# Patient Record
Sex: Male | Born: 1945
Health system: Southern US, Community
[De-identification: ages and names within clinical notes are randomized; demographics above are authoritative.]

## PROBLEM LIST (undated history)

## (undated) DIAGNOSIS — Z9989 Dependence on other enabling machines and devices: Secondary | ICD-10-CM

## (undated) DIAGNOSIS — I10 Essential (primary) hypertension: Secondary | ICD-10-CM

## (undated) DIAGNOSIS — I452 Bifascicular block: Secondary | ICD-10-CM

## (undated) DIAGNOSIS — E669 Obesity, unspecified: Secondary | ICD-10-CM

## (undated) DIAGNOSIS — N289 Disorder of kidney and ureter, unspecified: Secondary | ICD-10-CM

## (undated) DIAGNOSIS — E785 Hyperlipidemia, unspecified: Secondary | ICD-10-CM

## (undated) DIAGNOSIS — I219 Acute myocardial infarction, unspecified: Secondary | ICD-10-CM

## (undated) DIAGNOSIS — G4733 Obstructive sleep apnea (adult) (pediatric): Secondary | ICD-10-CM

## (undated) DIAGNOSIS — I2510 Atherosclerotic heart disease of native coronary artery without angina pectoris: Secondary | ICD-10-CM

## (undated) DIAGNOSIS — C801 Malignant (primary) neoplasm, unspecified: Secondary | ICD-10-CM

## (undated) HISTORY — DX: Dependence on other enabling machines and devices: Z99.89

## (undated) HISTORY — DX: Obstructive sleep apnea (adult) (pediatric): G47.33

## (undated) HISTORY — DX: Hyperlipidemia, unspecified: E78.5

## (undated) HISTORY — DX: Acute myocardial infarction, unspecified: I21.9

## (undated) HISTORY — PX: LOWER LEG SOFT TISSUE TUMOR EXCISION: SUR553

## (undated) HISTORY — DX: Atherosclerotic heart disease of native coronary artery without angina pectoris: I25.10

## (undated) HISTORY — PX: CORONARY ARTERY BYPASS GRAFT: SHX141

## (undated) HISTORY — DX: Obesity, unspecified: E66.9

## (undated) HISTORY — DX: Bifascicular block: I45.2

## (undated) HISTORY — DX: Essential (primary) hypertension: I10

---

## 2001-03-29 ENCOUNTER — Encounter: Payer: Self-pay | Admitting: Internal Medicine

## 2001-03-29 ENCOUNTER — Ambulatory Visit (HOSPITAL_COMMUNITY): Admission: RE | Admit: 2001-03-29 | Discharge: 2001-03-29 | Payer: Self-pay | Admitting: Internal Medicine

## 2002-09-29 ENCOUNTER — Encounter: Payer: Self-pay | Admitting: Internal Medicine

## 2002-09-29 ENCOUNTER — Ambulatory Visit (HOSPITAL_COMMUNITY): Admission: RE | Admit: 2002-09-29 | Discharge: 2002-09-29 | Payer: Self-pay | Admitting: Internal Medicine

## 2002-10-18 ENCOUNTER — Observation Stay (HOSPITAL_COMMUNITY): Admission: RE | Admit: 2002-10-18 | Discharge: 2002-10-19 | Payer: Self-pay | Admitting: General Surgery

## 2004-08-17 DIAGNOSIS — C801 Malignant (primary) neoplasm, unspecified: Secondary | ICD-10-CM

## 2004-08-17 HISTORY — DX: Malignant (primary) neoplasm, unspecified: C80.1

## 2004-09-08 ENCOUNTER — Ambulatory Visit: Payer: Self-pay | Admitting: *Deleted

## 2004-09-24 ENCOUNTER — Encounter (HOSPITAL_COMMUNITY): Admission: RE | Admit: 2004-09-24 | Discharge: 2004-09-25 | Payer: Self-pay | Admitting: *Deleted

## 2004-09-24 ENCOUNTER — Ambulatory Visit: Payer: Self-pay | Admitting: *Deleted

## 2004-10-02 ENCOUNTER — Ambulatory Visit: Payer: Self-pay | Admitting: *Deleted

## 2004-11-06 ENCOUNTER — Ambulatory Visit (HOSPITAL_COMMUNITY): Admission: RE | Admit: 2004-11-06 | Discharge: 2004-11-06 | Payer: Self-pay | Admitting: General Surgery

## 2005-10-05 ENCOUNTER — Ambulatory Visit: Payer: Self-pay | Admitting: *Deleted

## 2005-10-19 ENCOUNTER — Ambulatory Visit: Payer: Self-pay | Admitting: Cardiology

## 2005-11-05 ENCOUNTER — Ambulatory Visit: Payer: Self-pay | Admitting: *Deleted

## 2005-11-26 ENCOUNTER — Ambulatory Visit: Payer: Self-pay | Admitting: Cardiology

## 2005-12-17 ENCOUNTER — Ambulatory Visit: Payer: Self-pay | Admitting: *Deleted

## 2006-08-31 ENCOUNTER — Encounter (INDEPENDENT_AMBULATORY_CARE_PROVIDER_SITE_OTHER): Payer: Self-pay | Admitting: *Deleted

## 2006-08-31 LAB — CONVERTED CEMR LAB
AST: 17 units/L
Albumin: 4.4 g/dL
Bilirubin, Direct: 0.3 mg/dL
Cholesterol: 137 mg/dL
Total Protein: 6.8 g/dL
Triglycerides: 138 mg/dL

## 2006-09-24 ENCOUNTER — Ambulatory Visit: Payer: Self-pay | Admitting: Cardiovascular Disease

## 2006-10-06 ENCOUNTER — Ambulatory Visit: Payer: Self-pay | Admitting: Cardiovascular Disease

## 2006-10-06 ENCOUNTER — Encounter (HOSPITAL_COMMUNITY): Admission: RE | Admit: 2006-10-06 | Discharge: 2006-11-05 | Payer: Self-pay | Admitting: Cardiovascular Disease

## 2006-12-22 ENCOUNTER — Ambulatory Visit: Payer: Self-pay | Admitting: Cardiovascular Disease

## 2007-02-03 ENCOUNTER — Ambulatory Visit: Payer: Self-pay | Admitting: Cardiovascular Disease

## 2007-06-07 ENCOUNTER — Encounter (INDEPENDENT_AMBULATORY_CARE_PROVIDER_SITE_OTHER): Payer: Self-pay | Admitting: General Surgery

## 2007-06-07 ENCOUNTER — Ambulatory Visit (HOSPITAL_COMMUNITY): Admission: RE | Admit: 2007-06-07 | Discharge: 2007-06-07 | Payer: Self-pay | Admitting: General Surgery

## 2007-11-28 ENCOUNTER — Ambulatory Visit: Payer: Self-pay | Admitting: Cardiovascular Disease

## 2007-12-02 ENCOUNTER — Ambulatory Visit: Payer: Self-pay | Admitting: Cardiology

## 2007-12-02 ENCOUNTER — Encounter (HOSPITAL_COMMUNITY): Admission: RE | Admit: 2007-12-02 | Discharge: 2008-01-01 | Payer: Self-pay | Admitting: Cardiovascular Disease

## 2009-01-09 DIAGNOSIS — Z951 Presence of aortocoronary bypass graft: Secondary | ICD-10-CM | POA: Insufficient documentation

## 2009-01-09 DIAGNOSIS — I10 Essential (primary) hypertension: Secondary | ICD-10-CM | POA: Insufficient documentation

## 2009-01-09 DIAGNOSIS — E785 Hyperlipidemia, unspecified: Secondary | ICD-10-CM | POA: Insufficient documentation

## 2009-01-09 DIAGNOSIS — I2510 Atherosclerotic heart disease of native coronary artery without angina pectoris: Secondary | ICD-10-CM

## 2009-01-11 ENCOUNTER — Ambulatory Visit: Payer: Self-pay | Admitting: Cardiovascular Disease

## 2009-05-22 ENCOUNTER — Telehealth: Payer: Self-pay | Admitting: Cardiovascular Disease

## 2009-07-22 ENCOUNTER — Ambulatory Visit: Payer: Self-pay | Admitting: Cardiovascular Disease

## 2009-07-22 ENCOUNTER — Encounter (INDEPENDENT_AMBULATORY_CARE_PROVIDER_SITE_OTHER): Payer: Self-pay | Admitting: *Deleted

## 2009-07-22 DIAGNOSIS — E669 Obesity, unspecified: Secondary | ICD-10-CM | POA: Insufficient documentation

## 2009-07-22 DIAGNOSIS — E663 Overweight: Secondary | ICD-10-CM

## 2010-02-03 ENCOUNTER — Ambulatory Visit: Payer: Self-pay | Admitting: Cardiovascular Disease

## 2010-02-04 ENCOUNTER — Encounter: Payer: Self-pay | Admitting: Cardiovascular Disease

## 2010-02-11 ENCOUNTER — Telehealth (INDEPENDENT_AMBULATORY_CARE_PROVIDER_SITE_OTHER): Payer: Self-pay | Admitting: *Deleted

## 2010-02-18 LAB — CONVERTED CEMR LAB
ALT: 19 units/L (ref 0–53)
Alkaline Phosphatase: 80 units/L (ref 39–117)
Bilirubin, Direct: 0.3 mg/dL (ref 0.0–0.3)
HDL: 35 mg/dL — ABNORMAL LOW (ref >39)
Hgb A1c MFr Bld: 5.6 % (ref <5.7)
LDL Cholesterol: 88 mg/dL (ref 0–99)
Total Bilirubin: 1.5 mg/dL — ABNORMAL HIGH (ref 0.3–1.2)
Total Protein: 6.6 g/dL (ref 6.0–8.3)

## 2010-09-16 NOTE — Progress Notes (Signed)
Summary: RX REFILLS   Phone Note Call from Patient Call back at Home Phone [phone]   Reason for Call: Refill Medication Summary of Call: PT NEEDS ALL HIS MEDICATIONS REFILLED FOR THE YEAR. HCTZ, METOPROLOL, K, NITRO, VYTORIN AND WHAT EVER ELSE HE IS ON. [facility] [place]. Initial call taken by: Faythe Ghee,  February 11, 2010 9:44 AM    Prescriptions: LOSARTAN POTASSIUM 100 MG TABS (LOSARTAN POTASSIUM) take 1 tablet by mouth once daily  #30 Tablet x 11   Entered by:   Teressa Lower RN   Authorized by:   Colon Branch, MD, [facility]   Signed by:   Teressa Lower RN on 02/11/2010   Method used:   [facility]ronically [facility]        [facility] [phone][facility] (retail)       [address]       [place], [place]  [address]       Ph: [phone] or [phone]       Fax: [phone]   RxID:   [id number] VYTORIN 10-10 MG TABS (EZETIMIBE-SIMVASTATIN) Take one tablet by mouth daily at bedtime  #30 x 11   Entered by:   Teressa Lower RN   Authorized by:   Colon Branch, MD, [facility]   Signed by:   Teressa Lower RN on 02/11/2010   Method used:   Electronically to        [facility] [phone]* (retail)       [address]       [place], [place]  [address]       Ph: [phone] or [phone]       Fax: [phone]   RxID:   [id number] NITROGLYCERIN 0.4 MG SUBL (NITROGLYCERIN) One tablet under tongue every 5 minutes as needed for chest pain---may repeat times three  #25 x 3   Entered by:   Teressa Lower RN   Authorized by:   Colon Branch, MD, [facility]   Signed by:   Teressa Lower RN on 02/11/2010   Method used:   [facility]ronically [facility]        [facility] [phone]* (retail)       [address]       [place], [place]  [address]       Ph: [phone] or [phone]       Fax: [phone]   RxID:   [id number] LOPRESSOR 100 MG TABS (METOPROLOL TARTRATE) 1 tab by mouth two times a day  #60 x 11   Entered by:   Teressa Lower  RN   Authorized by:   Colon Branch, MD, [facility]   Signed by:   Teressa Lower RN on 02/11/2010   Method used:   [facility]ronically [facility]        [facility] [phone]* (retail)       [address]       [place], [place]  [address]       Ph: [phone] or [phone]       Fax: [phone]   RxID:   [id number] HYDROCHLOROTHIAZIDE 25 MG TABS (HYDROCHLOROTHIAZIDE) Take 1 tablet by mouth once daily  #30 Tablet x 11   Entered by:   Teressa Lower RN   Authorized by:   Colon Branch, MD, [facility]   Signed by:  Teressa Lower RN on 02/11/2010   Method used:   [facility]        [facility] [phone]* (retail)       [address]       [place], [place]  [address]       Ph: [phone] or [phone]       Fax: [phone]   RxID:   [id number]

## 2010-09-16 NOTE — Assessment & Plan Note (Signed)
Summary: 6 mth f/u per checkout on 07/22/09/tg      Allergies Added:   Visit Type:  Follow-up Primary Provider:  Dr.Golding  CC:  ankle edema.  History of Present Illness: Joshua Gentry is S/P CABG in 73 with normal LV function.  He has HTN and elevated lipids.  He is not having SSCP. t.  His last myovue that was non-ischemic I believe was 2008  He has lost a few pounds since I last saw him and is walking again.  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    Current Problems (verified): 1)  Overweight  (ICD-278.02) 2)  Hyperlipidemia  (ICD-272.4) 3)  Hypertension  (ICD-401.9) 4)  Cad  (ICD-414.00)  Current Medications (verified): 1)  Lopressor 100 Mg Tabs (Metoprolol Tartrate) .Marland Kitchen.. 1 Tab By Mouth Two Times A Day 2)  Nitroglycerin 0.4 Mg Subl (Nitroglycerin) .... One Tablet Under Tongue Every 5 Minutes As Needed For Chest Pain---May Repeat Times Three 3)  Vytorin 10-10 Mg Tabs (Ezetimibe-Simvastatin) .... Take One Tablet By Mouth Daily At Bedtime 4)  Hydrochlorothiazide 25 Mg Tabs (Hydrochlorothiazide) .... Take 1 Tablet By Mouth Once Daily 5)  Losartan Potassium 100 Mg Tabs (Losartan Potassium) .... Take 1 Tablet By Mouth Once Daily 6)  Aspirin 325 Mg Tabs (Aspirin) .... Take 1 Tab Daily 7)  Vitamin E 400 Unit Caps (Vitamin E) .... Take 1 Tab Daily 8)  Potassium 99 Mg Tabs (Potassium) .... Take 1 Tab Daily 9)  Co Q10 100 Mg Tabs (Coenzyme Q10) .... Take 1 Tab Daily 10)  Biotin 1000 Mcg Tabs (Biotin) .... Take 1 Tab Daily 11)  Vitamin C 500 Mg Tabs (Ascorbic Acid) .... Take 1 Tab Daily 12)  Glucosamine Chondroitin Complx  Caps (Glucosamine-Chondroit-Biofl-Mn) .... Take 1 Cap Daily 13)  Daily Multiple Vitamins  Tabs (Multiple Vitamin) .... Take 1 Tab Daily 14)  Calcium Carbonate 600 Mg Tabs (Calcium Carbonate)  .... Take 1 Tab Daily 15)  Fish Oil 300 Mg Caps (Omega-3 Fatty Acids) .... Take 1 Cap Daily  Allergies (verified): 1)  ! Penicillin 2)  ! * Niaspan  Past History:  Past Medical History: Last updated: 01/13/2009 Current Problems:  HYPERLIPIDEMIA (ICD-272.4) HYPERTENSION (ICD-401.9) CAD (ICD-414.00)  Past Surgical History: Last updated: 2009/01/13 cabg times 4  Family History: Last updated: 01-13-2009 Father:deceased due to myocardial infarction Mother:living with diabetes and hypertension  Social History: Last updated: 01/13/09 2 sisters with hypertension Married  Tobacco Use - No.  Alcohol Use - no Regular Exercise - no Drug Use - no patient has 3 children alive and well works has a [employer]ar[employer]  Review of Systems       Denies fever, malais, weight loss, blurry vision, decreased visual acuity, cough, sputum, SOB, hemoptysis, pleuritic pain, palpitaitons, heartburn, abdominal pain, melena, lower extremity edema, claudication, or rash.   Vital Signs:  Patient profile:   65 year old male Weight:      274 pounds Pulse rate:   58 / minute BP sitting:   146 / 84  (right arm)  Vitals Entered By: Dreama Saa, CNA (February 03, 2010 3:41 PM)  Physical Exam  General:  Affect appropriate Healthy:  appears stated age HEENT: normal Neck supple with no adenopathy JVP normal no bruits no thyromegaly Lungs clear with no wheezing and  good diaphragmatic motion Heart:  S1/S2 no murmur,rub, gallop or click PMI normal Abdomen: benighn, BS positve, no tenderness, no AAA no bruit.  No HSM or HJR Distal pulses intact with no bruits No edema Neuro non-focal Skin warm and dry    Impression & Recommendations:  Problem # 1:  HYPERLIPIDEMIA (ICD-272.4) Check labs this week continue statin.  LDL target 70 or less His updated medication list for this problem includes:    Vytorin 10-10 Mg Tabs (Ezetimibe-simvastatin) .Marland Kitchen... Take one tablet by mouth daily at  bedtime  Future Orders: T-Lipid Profile ([id number]) ... 02/04/2010 T-Hepatic Function [phone]) ... 02/04/2010  Problem # 2:  HYPERTENSION (ICD-401.9) Well controlled His updated medication list for this problem includes:    Lopressor 100 Mg Tabs (Metoprolol tartrate) .Marland Kitchen... 1 tab by mouth two times a day    Hydrochlorothiazide 25 Mg Tabs (Hydrochlorothiazide) .Marland Kitchen... Take 1 tablet by mouth once daily    Losartan Potassium 100 Mg Tabs (Losartan potassium) .Marland Kitchen... Take 1 tablet by mouth once daily    Aspirin 325 Mg Tabs (Aspirin) .Marland Kitchen... Take 1 tab daily  Future Orders: T-Hgb A1C ([id number]) ... 02/04/2010  Problem # 3:  CAD (ICD-414.00) S/P CABG with no angina and nonischemic myovue in 2008 His updated medication list for this problem includes:    Lopressor 100 Mg Tabs (Metoprolol tartrate) .Marland Kitchen... 1 tab by mouth two times a day    Nitroglycerin 0.4 Mg Subl (Nitroglycerin) ..... One tablet under tongue every 5 minutes as needed for chest pain---may repeat times three    Aspirin 325 Mg Tabs (Aspirin) .Marland Kitchen... Take 1 tab daily  Future Orders: T-Hgb A1C ([id number]) ... 02/04/2010  Problem # 4:  OVERWEIGHT (ICD-278.02) Discussed low carb diet and exercise program again.  Check HbA1c for type two diabetes  Patient Instructions: 1)  Your physician recommends that you schedule a follow-up appointment in: 1year 2)  Your physician recommends that you return for lab work in: This week 3)  Your physician recommends that you continue on your current medications as directed. Please refer to the Current Medication list given to you today.

## 2010-12-30 NOTE — Assessment & Plan Note (Signed)
[facility]                       [place] CARDIOLOGY OFFICE NOTE   ZORIAN, GUNDERMAN                     MRN:          [id number]  DATE:11/28/2007                            DOB:          April 21, 1946    Mr. Michalik is seen today in follow up.  His primary care doctor is now  Dr. Phillips Odor.  He had previous CABG in 1997.  His Myoview in March 2008,  was nonischemic.  He needs a follow-up in the next few weeks.  EF was  57%.  He is not having chest pain.  He tends to gain quite a bit of  weight over the wintertime and this is no exception.  He has gone from  254 to 263.  He tends to take his vacation in the fall and then has golf  outing on holidays.  We talked about his weight at length.  He is  determined to increase his activity levels and decrease his caloric  intake.  He continues to take his blood pressure pills.   He denied any significant chest pain, PND or orthopnea.  There has been  no lower extremity edema or palpitations.   MEDICATIONS:  1. Aspirin a day.  2. Calcium.  3. Multivitamins.  4. Avapro 300.  5. Hydrochlorothiazide 25.  6. Vytorin 10/20.  7. Lopressor 100 b.i.d.  8. Norvasc and clonidine were stopped due to postural hypotension.  9. Potassium.  10.Coenzyme Q.   PHYSICAL EXAMINATION:  GENERAL:  Remarkable for an overweight white male  in no distress.  VITAL SIGNS:  Blood pressure 130/80, weight is 263, respiratory rate 16,  afebrile, pulse 64.  HEENT:  Unremarkable.  NECK:  Carotids normal without bruit.  No lymphadenopathy.  No  thyromegaly or JVP elevation.  LUNGS:  Clear with diaphragmatic motion.  No wheezing.  HEART:  S1-S2, normal heart sounds.  PMI normal.  ABDOMEN:  Benign.  Bowel sounds positive.  No AAA, no tenderness.  No  hepatosplenomegaly or hepatojugular reflux.  EXTREMITIES:  Distal pulses intact.  No edema.  NEURO:  Nonfocal.  SKIN:  Warm and dry.  No muscular weakness.   IMPRESSION:  1.  Coronary disease, previous coronary artery bypass grafting, follow-      up Myoview  2. Hypertension, currently well-controlled.  Combine Avapro/HCTZ to      Avalide.  Low-salt diet.  3. Hyperlipidemia.  Continue Vytorin.  Lipid and liver profile in 6      months.  I will see the patient for a stress Myoview, and as long      as this is low-risk see him back in 6 months.     Noralyn Pick. Eden Emms, MD, [facility]  Electronically Signed    PCN/MedQ  DD: 11/28/2007  DT: 11/28/2007  Job #: [id number]

## 2010-12-30 NOTE — Assessment & Plan Note (Signed)
[facility]                       [place] CARDIOLOGY OFFICE NOTE   MARQUE, RADEMAKER                     MRN:          [id number]  DATE:01/11/2009                            DOB:          1945-12-07    Joshua Gentry returns today for followup.  He had previous coronary artery  bypass surgery in 1997 by Dr. Samule Ohm.  He has done well.  He is not  having a history of chest pain, PND, or orthopnea.  There has been no  palpitations.   His last Myoview as far as I can tell was December 02, 2007.  He had  hypertensive response to exercise.  He had a previous inferior wall  infarct with no ischemia and EF of 57%.  He has been active.  He still  works at [employer], site and at the [employer] on the  weekends.  He has not had any new symptoms.  Unfortunately his weight  continues to go up.  I reviewed these in his chart.  In 2008, he was 254  and in 2009 he was 263 and now he is 273.  I had a long discussion with  him regarding his weight.  Certainly is not helping his glucose  tolerance or his blood pressure.  He has gotten away from running on a  treadmill.  I gave him an exercise prescription with a target heart rate  of 130.   Otherwise, he has been compliant with his medications.  I told him to  follow up with Dr. Phillips Odor as he may need the addition of Norvasc to his  blood pressure meds if his weight does not come down.   He is allergic to PENICILLIN and NEOSPORIN.   He is happily married.  He likes to golf.  He works in [employer] site.  He has been more sedentary lately and diet is somewhat poor particularly  in regards to salt intake.  He does not smoke or drink.   MEDICATIONS:  1. Aspirin a day.  2. Lopressor 100 b.i.d.  3. Avalide 325.  4. Vytorin 10/20.   PHYSICAL EXAMINATION:  GENERAL:  An overweight 6 feet 2 male.  Affect is  jovial.  VITAL SIGNS:  Weight is 273, blood pressure 150/88, pulse 54 and  regular,  respiratory 14, afebrile.  HEENT:  Unremarkable.  NECK:  Carotids are normal without bruit.  No lymphadenopathy,  thyromegaly, or JVP elevation.  LUNGS:  Clear with good diaphragmatic motion.  No wheezing.  CARDIAC:  S1 and S2.  Normal heart sounds.  Status post sternotomy.  PMI  normal.  ABDOMEN:  Benign.  Bowel sounds positive.  No AAA.  No tenderness.  No  bruit.  No hepatosplenomegaly or hepatojugular reflux.  EXTREMITIES:  Distal pulse intact.  No edema.  NEUROLOGIC:  Nonfocal.  SKIN:  Warm and dry.  MUSCULOSKELETAL:  No muscle weakness.  He has a right radial harvest  site as well with an absent right radial pulse.   EKG shows sinus brady with left axis deviation, incomplete right bundle  branch block, previous inferior wall MI mass by left anterior fascicular  block, and poor R-wave progression.   IMPRESSION:  1. Coronary artery disease, previous coronary artery bypass graft,      asymptomatic.  Continue aspirin and beta-blocker.  2. Weight gain, low-carbohydrate diet, went over to increase exercise      particularly with previous treadmill work.  Target weight 155      within the next 6 months.  3. Hyperlipidemia.  Continue Vytorin.  He needs a followup lipid and      liver profile.  He will come back to [facility] next week      to assess that.  4. Hypertension, borderline control.  Continue Avalide.  Follow up      with Dr. Phillips Odor.  Consider adding Norvasc in 6 months if his      weight stays up.   He will see one of the cardiologists in [place] in 6 months.     Noralyn Pick. Eden Emms, MD, [facility]  Electronically Signed    PCN/MedQ  DD: 01/11/2009  DT: 01/12/2009  Job #: [id number]

## 2010-12-30 NOTE — Assessment & Plan Note (Signed)
[facility]                       [place] CARDIOLOGY OFFICE NOTE   MIHCAEL, LEDEE                     MRN:          [id number]  DATE:02/03/2007                            DOB:          [birth year]    Mr. Blanck returns today for followup.  He has had previous coronary  bypass surgery in 1997.  I initially saw him in May for some exertional  dyspnea.  This has improved.  His blood pressure was quite low, and he  had been having some postural signs.  His Myoview is nonischemic with an  EF of 61%.  We felt that he was being over medicated.  We asked him to  wean off his clonidine and amlodipine, neither of which are particularly  effective for people with coronary disease.  He has done this and his  blood pressure has come up nicely without any rebound hypertension.   He feels better.  He continues to work out on a daily basis and work at  [employer] at [employer].   He has not had any more postural signs and no dizziness.  There has been  no significant chest pain, and exertional dyspnea has improved.   His review of systems is remarkable for increased energy level without  any presyncopal spells.  He has not had any significant chest pain.   CURRENT MEDICATIONS:  1. Aspirin a day.  2. Avapro 300 a day.  3. Vytorin 10/20.  4. Hydrochlorothiazide 25 a day.  5. Lopressor 100 b.i.d.  6. His amlodipine and clonidine has been stopped.  7. He also takes potassium.   PHYSICAL EXAMINATION:  His exam is remarkable for blood pressure of  130/70, pulse is 70 and regular.  He is afebrile.  Weight is 254 pounds.  Respiratory rate is 14.  He is a healthy-appearing middle aged white male in no distress.  NECK:  Supple.  There is no lymphadenopathy, no thyromegaly, no JVP  elevation, no carotid bruits.  LUNGS:  Clear with good diaphragmatic motion.  There is an S1, S2 with normal heart sounds.  There is no murmur.  PMI  is normal.  ABDOMEN:  Benign.  There are no renal bruits.  Bowel sounds positive.  No tenderness, no hepatosplenomegaly or hepatojugular reflux.  Femorals were +4 without bruit.  PTs were +3 bilaterally.  There is no  lower extremity edema.  NEURO:  Nonfocal.  There is no muscular weakness.   IMPRESSION:  1. Hypertension:  Well controlled, no need for clonidine or      amlodipine.  No rebound hypertension.  Currently doing well.      Continue low-salt diet.  2. Coronary bypass surgery in 1997:  Nonischemic Myoview, normal left      ventricular function.  Continue risk factor modification.  3. Hyperlipidemia: Continue Vytorin 10/20.  LDL cholesterol 72 with      minimally elevated total bilirubin.  Followup LFTs in 6 months.   Overall, the patient has improved.  He will continue to try and lose  weight and I will see him back in 6 months.  Noralyn Pick. Eden Emms, MD, [facility]  Electronically Signed    PCN/MedQ  DD: 02/03/2007  DT: 02/03/2007  Job #: [id number]

## 2010-12-30 NOTE — H&P (Signed)
NAME:  Joshua Gentry, Joshua Gentry NO.:  [id number]   MEDICAL RECORD NO.:  [id number]           PATIENT TYPE:  AMB   LOCATION:  DAY                           FACILITY:  [facility]   PHYSICIAN:  Dalia Heading, M.D.  DATE OF BIRTH:  [birth year]   DATE OF ADMISSION:  06/07/2007  DATE OF DISCHARGE:  LH                              HISTORY & PHYSICAL   CHIEF COMPLAINT:  Need for screening colonoscopy.   HISTORY OF PRESENT ILLNESS:  The patient is a 65 year old white male who  is referred for endoscopic evaluation.  He needs a colonoscopy for  screening purposes.  No abdominal pain, weight loss, nausea, vomiting,  diarrhea, constipation, melena, or hematochezia have been noted.  He has  never had a colonoscopy.  There is no family history of colon carcinoma.   PAST MEDICAL HISTORY:  1. Hypertension.  2. Non-insulin-dependent diabetes mellitus.  3. Coronary artery disease.   PAST SURGICAL HISTORY:  1. CABG.  2. Leg surgery.   CURRENT MEDICATIONS:  Hydrochlorothiazide, Avapro, aspirin, metoprolol,  Vytorin.   ALLERGIES:  PENICILLIN.   REVIEW OF SYSTEMS:  The patient denies drinking or smoking.  He denies  any other cardiopulmonary difficulties or bleeding disorders.   PHYSICAL EXAMINATION:  GENERAL:  The patient is a well-developed, well-  nourished white male in no acute distress.  LUNGS:  Clear to auscultation with equal breath sounds bilaterally.  HEART:  Examination reveals a regular rate and rhythm without S3, S4, or  murmurs.  ABDOMEN:  Soft, nontender, and nondistended.  No hepatosplenomegaly or  masses are noted.  RECTAL:  Examination was deferred to the procedure.   IMPRESSION:  Need for screening colonoscopy.   PLAN:  The patient is scheduled for colonoscopy on June 07, 2007.  Risks and benefits of the procedure including bleeding and perforation  were fully explained to the patient, who gave informed consent.      Dalia Heading, M.D.     MAJ/MEDQ  D:   05/19/2007  T:  05/20/2007  Job:  [id number]   cc:   Corrie Mckusick, M.D.  Fax: [phone]

## 2010-12-30 NOTE — H&P (Signed)
NAME:  Joshua Gentry, Joshua Gentry NO.:  [id number]   MEDICAL RECORD NO.:  [id number]           PATIENT TYPE:  AMB   LOCATION:  DAY                           FACILITY:  [facility]   PHYSICIAN:  Dalia Heading, M.D.  DATE OF BIRTH:  1945/10/11   DATE OF ADMISSION:  06/07/2007  DATE OF DISCHARGE:  LH                              HISTORY & PHYSICAL   CHIEF COMPLAINT:  Need for screening colonoscopy.   HISTORY OF PRESENT ILLNESS:  The patient is a 65 year old white male who  is referred for endoscopic evaluation.  He needs a colonoscopy for  screening purposes.  No abdominal pain, weight loss, nausea, vomiting,  diarrhea, constipation, melena, or hematochezia have been noted.  He has  never had a colonoscopy.  There is no family history of colon carcinoma.   PAST MEDICAL HISTORY:  1. Hypertension.  2. Non-insulin-dependent diabetes mellitus.  3. Coronary artery disease.   PAST SURGICAL HISTORY:  1. CABG.  2. Leg surgery.   CURRENT MEDICATIONS:  Hydrochlorothiazide, Avapro, aspirin, metoprolol,  Vytorin.   ALLERGIES:  PENICILLIN.   REVIEW OF SYSTEMS:  The patient denies drinking or smoking.  He denies  any other cardiopulmonary difficulties or bleeding disorders.   PHYSICAL EXAMINATION:  GENERAL:  The patient is a well-developed, well-  nourished white male in no acute distress.  LUNGS:  Clear to auscultation with equal breath sounds bilaterally.  HEART:  Examination reveals a regular rate and rhythm without S3, S4, or  murmurs.  ABDOMEN:  Soft, nontender, and nondistended.  No hepatosplenomegaly or  masses are noted.  RECTAL:  Examination was deferred to the procedure.   IMPRESSION:  Need for screening colonoscopy.   PLAN:  The patient is scheduled for colonoscopy on June 07, 2007.  Risks and benefits of the procedure including bleeding and perforation  were fully explained to the patient, who gave informed consent.      Dalia Heading, M.D.  Electronically  Signed     MAJ/MEDQ  D:  05/19/2007  T:  05/20/2007  Job:  [id number]   cc:   Corrie Mckusick, M.D.  Fax: [phone]

## 2011-01-02 NOTE — Procedures (Signed)
NAME:  Joshua Gentry, Joshua Gentry NO.:  [id number]   MEDICAL RECORD NO.:  [id number]         PATIENT TYPE:  REC   LOCATION:  RAD                           FACILITY:  [facility]   PHYSICIAN:  Vida Roller, M.D.   DATE OF BIRTH:  June 11, 1946   DATE OF PROCEDURE:  09/24/2004  DATE OF DISCHARGE:                                    STRESS TEST   Mr. Hedglin is a 65 year old gentleman with known coronary artery disease  status post CABG in 1997 with the following grafts: LIMA to LAD, radial OM,  SVG to diagonal, SVG to RCA and PDA with an EF of 60%. Cardiolite study done  in August of 2002 revealed no ischemia and EF of 58%. Regular treadmill test  done in 2004 revealed no ischemic changes, good exercise tolerance.   The patient originally scheduled for an exercise Myoview. He walked for  approximately 6 minutes and 44 seconds to Bruce protocol stage 3 and 7.0  METS. Maximal heart rate achieved was 115 beats per minute which is 70% of  predicted maximum. Maximum blood pressure was 200/90. The patient reported  shortness of breath and was unable to continue exercise. He denied any chest  discomfort.   Test was changed to an Adenosine Myoview at that point. Baseline data for  Adenosine revealed an EKG with a sinus rhythm at 67 beats per minute with  nonspecific ST abnormalities. Blood pressure is 142/82. 60 mg adenosine was  infused over 4 minute protocol. Myoview injected at 3 minutes. The patient  reported shortness of breath which resolved in recovery. Electrocardiogram  with exercise revealed frequent PVCs and a few couplets. No ischemic changes  were noted. An electrocardiogram with adenosine infusion revealed no  ischemic changes and no arrhythmias.     Of note, this is a two-day study and the patient did take his beta blocker  this morning.      AB/MEDQ  D:  09/24/2004  T:  09/24/2004  Job:  [id number]

## 2011-01-02 NOTE — Assessment & Plan Note (Signed)
[facility]                       [place] CARDIOLOGY OFFICE NOTE   ROCKIE, SCHNOOR                     MRN:          [id number]  DATE:09/24/2006                            DOB:          October 25, 1945    Joshua Gentry is seen as new patient to me.  He has had previous bypass in  1997 by Dr. Samule Ohm.  At the time, apparently, he had poor color and some  indigestion.   His last Myoview was in 2002, which was negative for ischemia, and  normal ejection fraction.   He has hypertension and hyperlipidemia.   He has been getting some exertional dyspnea.  He does not have  nitroglycerin at home.  He has not had any significant chest pain or  recurrent indigestion.   He is still working.  He is fairly active at work, but not at home.   He does not smoke.  He has been compliant with his meds.  He is  currently taking:  1. Omega.  2. Vitamins.  3. Avapro 300 a day.  4. Vytorin 10/20.  5. Hydrochlorothiazide 25 a day.  6. Lopressor 100 b.i.d.  7. Norvasc 5 a day.  8. Clonidine 0.1 b.i.d.  9. Coenzyme Q.  10.Potassium.   The patient was inquiring about Viagra.  I told him I would advise this  given his 63 year old bypass grafts.   He also asks me if he should stop the Zetia, and I told him that I would  not do this based on 1 study, since he has been stable on it for the  last 2 years, and his LDL cholesterol has come down nicely to 85.   EXAMINATION:  He is overweight.  Blood pressure is 130/70.  Pulse 70 and regular.  HEENT:  Normal.  Carotids are normal without bruit.  LUNGS:  Clear.  There is an S1 and S2 with normal heart sounds.  ABDOMEN:  Benign.  He has a long scar in the medial aspect of this left thigh from previous  cancer.  Distal pulses are intact with no edema.   IMPRESSION:  Exertional dyspnea with 63 year old bypass grafts.  He  needs to follow up with an exercise stress Myoview.  We will continue  current medications.  His  risk factors seem well modified.  His blood  pressure is under good control.  His LDL cholesterol is 85.  I would not  stop the Zetia at this point.   He was given a prescription for nitroglycerin.  I will see him back when  he has his stress Myoview.  As long as this is low risk, we will  continue medical therapy.     Noralyn Pick. Eden Emms, MD, [facility]  Electronically Signed    PCN/MedQ  DD: 09/24/2006  DT: 09/24/2006  Job #: [id number]

## 2011-01-02 NOTE — Op Note (Signed)
NAME:  Joshua Gentry, Joshua Gentry NO.:  [id number]   MEDICAL RECORD NO.:  [id number]                   PATIENT TYPE:  AMB   LOCATION:  DAY                                  FACILITY:  [facility]   PHYSICIAN:  Barbaraann Barthel, M.D.              DATE OF BIRTH:  10-13-1945   DATE OF PROCEDURE:  DATE OF DISCHARGE:                                 OPERATIVE REPORT   PREOPERATIVE DIAGNOSIS:  Mass right thigh (giant lipoma).   POSTOPERATIVE DIAGNOSIS:  Mass right thigh (giant lipoma).   PROCEDURE:   SURGEON:  Barbaraann Barthel, M.D.   INDICATIONS:  This is a 65 year old white male who presented with a mass in  the medial aspect of the right thigh.  This had grown to rather large  proportions and was chafing him and causing him difficulty to ambulate.  Preoperative MRI showed that this was a large lipoma likely approximately  17.6 x 12.5 x 11.5 cm in diameter. This was intramuscular in the adductor  longus muscle of his right thigh.  The patient also had a preoperative  clearance with cardiology because he had a history of coronary artery  disease.   GROSS OPERATIVE FINDINGS:  Large intramuscular lipoma with multiple  pseudopod type of extensions. These were removed in toto.  These were sent  for final pathology.  This appeared to be benign lipoma.   SPECIMENS:  Lipoma right thigh, giant.   DESCRIPTION OF PROCEDURE:  The patient was placed in the supine position  with the right leg frog-legged after adequate spinal anesthesia.  The right  lower extremity was prepped with Betadine solution and draped in the usual  manner, and a longitudinal incision was carried out over the maximal  prominence of the lesion.  The skin and subcutaneous tissue were incised  down to the adductor longus muscle which was opened and splint and the large  lipoma was removed using Surgiclip and 2-0 Polysorb ties for vascular venous  blood vessels in the area.  We avoided the large neurovascular  area in the  right groin area.  After checking for hemostasis the wound was then  irrigated.  I elected to leave a Jackson-Pratt drain due to the large size  of this lipoma.  This was entered through a separate stab wound incision.  The subcutaneous layer was closed with 3-0 Polysorb.  The skin was  approximated with a stapling device and sterile dressing with Neosporin and  a drain sponge was applied.  Prior to closure, all sponge, needle and  instrument counts were found to be correct.  Estimated blood loss was  minimal.  One Jackson-Pratt drain was placed.  The patient received  approximately 900 mL crystalloid intraoperatively.   We discussed keeping this patient in observation due to the large size of  the lipoma and the drain, etc.  We also discussed complications of the  surgery with the patient including bleeding,  infection and paresthesias in  this area, and all questions were answered.  We will likely discharge him in  less than the 24 hour observation period.                                               Barbaraann Barthel, M.D.    WB/MEDQ  D:  10/18/2002  T:  10/18/2002  Job:  [id number]   cc:   Madelin Rear. Sherwood Gambler, M.D.  [address]  [place]  [place] [address]  Fax: [phone]

## 2011-01-02 NOTE — Assessment & Plan Note (Signed)
[facility]                            CARDIOLOGY OFFICE NOTE   Joshua, Gentry                     MRN:          [id number]  DATE:12/22/2006                            DOB:          1946/05/28    Joshua Gentry returns today for followup.  He had previous bypass in 1997.  He had been having some exertional dyspnea.  He subsequently underwent a  Myoview study on January 10 of 2008.  It was normal.  There were no EKG  changes and his EF was 61%.  In talking to the patient, he has been  doing well in regards to his coronary disease.  He has been active  around the house and in the yard.  He is golfing.  He is not having  significant chest pain, PND or orthopnea.  There has been no syncope or  palpitations.  He is able to sleep at night without any shortness of  breath.   REVIEW OF SYSTEMS:  Remarkable for some fatigue and what he feels is an  excessively low blood pressure later in the day.  He says his wife has  been concerned that his blood pressure is dipping under 100.   I reviewed the patient's medicines and we will make some changes.  I do  agree he is probably on too many medications at this time.   In regards to his risk factor modification, his last LDL cholesterol was  72 in January of 2008, with normal LFTs.   He is a nonsmoker, nondrinker.   CURRENT MEDICATIONS INCLUDE:  1. An aspirin a day.  2. Avapro 300 a day.  3. Vytorin 10/20.  4. Hydrochlorothiazide 25 a day.  5. Lopressor 100 b.i.d.  6. Amlodipine 5 mg a day.  7. Clonidine 0.1 b.i.d.  8. Potassium.   His exam is remarkable for a middle-aged male in no distress.  Mood is  appropriate.  His weight is 259.  Blood pressure is 118/80.  He is not  postural.  Pulse is 67 and only goes up to 70 with standing.  Respiratory rate is 12.  HEENT:  Normal.  NECK:  No thyromegaly, no JVP elevation.  No bruits.  LUNGS:  Clear to auscultation and there is normal diaphragmatic  movement.  CARDIAC SOUNDS:  Normal with an S1 and S2.  There is no murmur, rub,  gallop or click.  The PMI is normal.  ABDOMEN:  Benign.  There is no tenderness.  Bowel sounds are positive.  There is no organomegaly or masses.  There is no hepatosplenomegaly.  No  hepatojugular reflux and no AAA.  Distal pulses are intact with no edema, no bruits.  MUSCULAR EXAM:  Shows no weakness.  NEUROLOGICAL EXAM:  Nonfocal.   IMPRESSION:  Coronary disease with old bypass grafts from 49.  Recent  Myoview with no evidence of ischemia and normal LV function.  Risk  factor modification excellent.  Continue Vytorin.   Hypertension seems to be over-treated at this time.  I told him to cut  back his Clonidine to 0.1 mg a day for a week  and then to stop the  Clonidine altogether.  This is a b.i.d. drug and his pressure probably  does get excessively low with the second dose of Clonidine.   It is also the medicine most prone to rebound and I prefer to stop this  drug first.  If the patient continues to have excessively low blood  pressure, I would next stop his amlodipine.  I talked to the patient  about this.  He will monitor his pressures and start weaning the  Clonidine.  I will see him back in six to eight weeks to reassess this  and his overall energy level to see if it improves.   From a cardiac standpoint, he is otherwise doing well.  He has  nitroglycerin at home, in case he needs it.  He will call us if he has  any chest pain, but it was encouraging to see that his Myoview is so  normal.     Joshua Arista C. Eden Emms, MD, [facility]  Electronically Signed    PCN/MedQ  DD: 12/22/2006  DT: 12/22/2006  Job #: [id number]

## 2011-02-16 ENCOUNTER — Other Ambulatory Visit: Payer: Self-pay | Admitting: *Deleted

## 2011-02-16 MED ORDER — HYDROCHLOROTHIAZIDE 25 MG PO TABS: 25.0000 mg | ORAL_TABLET | Freq: Every day | ORAL | Status: DC

## 2011-02-17 ENCOUNTER — Other Ambulatory Visit: Payer: Self-pay | Admitting: *Deleted

## 2011-02-17 MED ORDER — LOSARTAN POTASSIUM 100 MG PO TABS: 100.0000 mg | ORAL_TABLET | Freq: Every day | ORAL | Status: DC

## 2011-02-19 ENCOUNTER — Ambulatory Visit (INDEPENDENT_AMBULATORY_CARE_PROVIDER_SITE_OTHER): Payer: 59 | Admitting: Cardiovascular Disease

## 2011-02-19 ENCOUNTER — Encounter: Payer: Self-pay | Admitting: Cardiovascular Disease

## 2011-02-19 DIAGNOSIS — E785 Hyperlipidemia, unspecified: Secondary | ICD-10-CM

## 2011-02-19 DIAGNOSIS — I10 Essential (primary) hypertension: Secondary | ICD-10-CM

## 2011-02-19 DIAGNOSIS — I2510 Atherosclerotic heart disease of native coronary artery without angina pectoris: Secondary | ICD-10-CM

## 2011-02-19 NOTE — Progress Notes (Signed)
Joshua Gentry returns today for followup. He had previous bypass in 1997.  He last  underwent a Myoview study on January 10 of 2008. It was normal. There were no EKG  changes and his EF was 61%. In talking to the patient, he has been  doing well in regards to his coronary disease. He has been active  around the house and in the yard. He is golfing. He is not having  significant chest pain, PND or orthopnea. There has been no syncope or  palpitations. He is able to sleep at night without any shortness of  breath.  Lab work from Dr Phillips Odor reviewed LDL 88 and LFT;s ok with TB 1.5  ROS: Denies fever, malais, weight loss, blurry vision, decreased visual acuity, cough, sputum, SOB, hemoptysis, pleuritic pain, palpitaitons, heartburn, abdominal pain, melena, lower extremity edema, claudication, or rash.  All other systems reviewed and negative  General: Affect appropriate Healthy:  appears stated age HEENT: normal Neck supple with no adenopathy JVP normal no bruits no thyromegaly Lungs clear with no wheezing and good diaphragmatic motion Heart:  S1/S2 no murmur,rub, gallop or click PMI normal Abdomen: benighn, BS positve, no tenderness, no AAA no bruit.  No HSM or HJR Distal pulses intact with no bruits No edema Neuro non-focal Skin warm and dry No muscular weakness   Current Outpatient Prescriptions  Medication Sig Dispense Refill  . aspirin 325 MG tablet Take 325 mg by mouth daily.        . calcium-vitamin D 250-100 MG-UNIT per tablet Take 1 tablet by mouth 2 (two) times daily.        . Coenzyme Q10 (CO Q 10) 100 MG CAPS Take 100 mg by mouth daily.        Marland Kitchen ezetimibe-simvastatin (VYTORIN) 10-10 MG per tablet Take 1 tablet by mouth daily.        . Fish Oil-Cholecalciferol (OMEGA-3 FISH OIL/VITAMIN D3) 1000-1000 MG-UNIT CAPS Take by mouth.        Marland Kitchen glucosamine-chondroitin 500-400 MG tablet Take 1 tablet by mouth 3 (three) times daily.        Marland Kitchen METOPROLOL TARTRATE PO Take 100 mg by mouth  2 (two) times daily.        . Multiple Vitamins-Minerals (MULTIVITAMIN WITH MINERALS) tablet Take 1 tablet by mouth daily.        . nitroGLYCERIN (NITROSTAT) 0.4 MG SL tablet Place 0.4 mg under the tongue every 5 (five) minutes as needed.        . Potassium 99 MG TABS Take 99 mg by mouth daily.        . vitamin C (ASCORBIC ACID) 500 MG tablet Take 500 mg by mouth daily.        . vitamin E 400 UNIT capsule Take 400 Units by mouth daily.        . hydrochlorothiazide 25 MG tablet Take 1 tablet (25 mg total) by mouth daily.  30 tablet  3  . losartan (COZAAR) 100 MG tablet Take 1 tablet (100 mg total) by mouth daily.  30 tablet  11    Allergies  Niacin and Penicillins  Electrocardiogram:  Assessment and Plan

## 2011-02-19 NOTE — Patient Instructions (Signed)
Your physician recommends that you schedule a follow-up appointment in: 1 year  

## 2011-02-19 NOTE — Assessment & Plan Note (Signed)
Stable with no angina and good activity level.  Continue medical Rx  

## 2011-02-19 NOTE — Assessment & Plan Note (Signed)
Cholesterol is at goal.  Continue current dose of statin and diet Rx.  No myalgias or side effects.  F/U  LFT's in 6 months. Lab Results  Component Value Date   LDLCALC 88 02/04/2010

## 2011-02-19 NOTE — Assessment & Plan Note (Signed)
Well controlled.  Continue current medications and low sodium Dash type diet.    

## 2011-03-20 ENCOUNTER — Telehealth: Payer: Self-pay | Admitting: *Deleted

## 2011-03-20 MED ORDER — METOPROLOL TARTRATE 100 MG PO TABS: 100.0000 mg | ORAL_TABLET | Freq: Two times a day (BID) | ORAL | Status: DC

## 2011-03-20 NOTE — Telephone Encounter (Signed)
Pt needs rx sent for metoprolol 100mg  1 po bid sent to [facility] [address] [place]

## 2011-05-27 ENCOUNTER — Other Ambulatory Visit: Payer: Self-pay | Admitting: *Deleted

## 2011-05-27 MED ORDER — EZETIMIBE-SIMVASTATIN 10-10 MG PO TABS: 1.0000 | ORAL_TABLET | Freq: Every day | ORAL | Status: DC

## 2011-06-03 ENCOUNTER — Other Ambulatory Visit: Payer: Self-pay | Admitting: *Deleted

## 2011-06-03 MED ORDER — EZETIMIBE-SIMVASTATIN 10-10 MG PO TABS: 1.0000 | ORAL_TABLET | Freq: Every day | ORAL | Status: DC

## 2011-06-13 ENCOUNTER — Other Ambulatory Visit: Payer: Self-pay | Admitting: Cardiovascular Disease

## 2011-07-20 ENCOUNTER — Other Ambulatory Visit: Payer: Self-pay | Admitting: *Deleted

## 2011-07-20 MED ORDER — METOPROLOL TARTRATE 100 MG PO TABS: 100.0000 mg | ORAL_TABLET | Freq: Two times a day (BID) | ORAL | Status: DC

## 2011-08-12 ENCOUNTER — Encounter: Payer: Self-pay | Admitting: Cardiovascular Disease

## 2011-10-20 ENCOUNTER — Other Ambulatory Visit: Payer: Self-pay

## 2011-10-20 MED ORDER — HYDROCHLOROTHIAZIDE 25 MG PO TABS: 25.0000 mg | ORAL_TABLET | Freq: Every day | ORAL | Status: DC

## 2011-11-16 ENCOUNTER — Other Ambulatory Visit: Payer: Self-pay | Admitting: Cardiovascular Disease

## 2012-02-17 ENCOUNTER — Other Ambulatory Visit: Payer: Self-pay | Admitting: Cardiovascular Disease

## 2012-02-17 NOTE — Telephone Encounter (Signed)
Pt needs appointment then refill can be made Fax Received. Refill Completed. Krystal Teachey Chowoe (R.M.A)   

## 2012-02-22 ENCOUNTER — Other Ambulatory Visit: Payer: Self-pay | Admitting: *Deleted

## 2012-02-22 MED ORDER — LOSARTAN POTASSIUM 100 MG PO TABS: 100.0000 mg | ORAL_TABLET | Freq: Every day | ORAL | Status: DC

## 2012-03-18 ENCOUNTER — Ambulatory Visit (INDEPENDENT_AMBULATORY_CARE_PROVIDER_SITE_OTHER): Payer: BC Managed Care – PPO | Admitting: Cardiovascular Disease

## 2012-03-18 ENCOUNTER — Encounter: Payer: Self-pay | Admitting: Cardiovascular Disease

## 2012-03-18 VITALS — BP 150/90 | HR 59 | Ht 75.0 in | Wt 283.0 lb

## 2012-03-18 DIAGNOSIS — E663 Overweight: Secondary | ICD-10-CM

## 2012-03-18 DIAGNOSIS — I10 Essential (primary) hypertension: Secondary | ICD-10-CM

## 2012-03-18 DIAGNOSIS — I2510 Atherosclerotic heart disease of native coronary artery without angina pectoris: Secondary | ICD-10-CM

## 2012-03-18 DIAGNOSIS — E785 Hyperlipidemia, unspecified: Secondary | ICD-10-CM

## 2012-03-18 MED ORDER — LOSARTAN POTASSIUM 100 MG PO TABS: 100.0000 mg | ORAL_TABLET | Freq: Every day | ORAL | Status: DC

## 2012-03-18 MED ORDER — EZETIMIBE-SIMVASTATIN 10-10 MG PO TABS: 1.0000 | ORAL_TABLET | Freq: Every day | ORAL | Status: DC

## 2012-03-18 NOTE — Addendum Note (Signed)
Addended by: Reather Laurence A on: 03/18/2012 11:10 AM   Modules accepted: Orders

## 2012-03-18 NOTE — Assessment & Plan Note (Signed)
Discussed low carb diet.  Wife has gluten allergy so he dose avoid bread  Exercise program discussed

## 2012-03-18 NOTE — Assessment & Plan Note (Signed)
Needs fasting lipid and liver next week.  Has not had lab work for a long time.  No myalgias

## 2012-03-18 NOTE — Patient Instructions (Addendum)
Your physician recommends that you schedule a follow-up appointment in: 1 year  Your physician recommends that you return for lab work in: Next week

## 2012-03-18 NOTE — Progress Notes (Signed)
Patient ID: Joshua Gentry, male   DOB: 1946/04/19, 66 y.o.   MRN: [id number] Mr. Huitron returns today for followup. He had previous bypass in 1997. He last underwent a Myoview study on January 10 of 2008. It was normal. There were no EKG changes and his EF was 61%. In talking to the patient, he has been doing well in regards to his coronary disease. He has been active around the house and in the yard. He is golfing. He is not having significant chest pain, PND or orthopnea. There has been no syncope or palpitations. He is able to sleep at night without any shortness of breath.  ROS: Denies fever, malais, weight loss, blurry vision, decreased visual acuity, cough, sputum, SOB, hemoptysis, pleuritic pain, palpitaitons, heartburn, abdominal pain, melena, lower extremity edema, claudication, or rash.  All other systems reviewed and negative  General: Affect appropriate Obese white male  HEENT: normal Neck supple with no adenopathy JVP normal no bruits no thyromegaly Lungs clear with no wheezing and good diaphragmatic motion Heart:  S1/S2 no murmur, no rub, gallop or click PMI normal Abdomen: benighn, BS positve, no tenderness, no AAA no bruit.  No HSM or HJR Distal pulses intact with no bruits No edema Neuro non-focal Skin warm and dry No muscular weakness   Current Outpatient Prescriptions  Medication Sig Dispense Refill  . aspirin 325 MG tablet Take 325 mg by mouth daily.        . calcium-vitamin D 250-100 MG-UNIT per tablet Take 1 tablet by mouth 2 (two) times daily.        . Coenzyme Q10 (CO Q 10) 100 MG CAPS Take 100 mg by mouth daily.        Marland Kitchen ezetimibe-simvastatin (VYTORIN) 10-10 MG per tablet Take 1 tablet by mouth daily.  30 tablet  12  . Fish Oil-Cholecalciferol (OMEGA-3 FISH OIL/VITAMIN D3) 1000-1000 MG-UNIT CAPS Take by mouth.        Marland Kitchen glucosamine-chondroitin 500-400 MG tablet Take 1 tablet by mouth 3 (three) times daily.        . hydrochlorothiazide (HYDRODIURIL) 25 MG tablet  TAKE 1 TABLET EVERY DAY  30 tablet  1  . losartan (COZAAR) 100 MG tablet Take 1 tablet (100 mg total) by mouth daily.  30 tablet  3  . metoprolol (LOPRESSOR) 100 MG tablet TAKE 1 TABLET (100 MG TOTAL) BY MOUTH 2 (TWO) TIMES DAILY.  60 tablet  3  . Multiple Vitamins-Minerals (MULTIVITAMIN WITH MINERALS) tablet Take 1 tablet by mouth daily.        . nitroGLYCERIN (NITROSTAT) 0.4 MG SL tablet Place 0.4 mg under the tongue every 5 (five) minutes as needed.        . Potassium 99 MG TABS Take 99 mg by mouth daily.        . vitamin C (ASCORBIC ACID) 500 MG tablet Take 500 mg by mouth daily.        . vitamin E 400 UNIT capsule Take 400 Units by mouth daily.          Allergies  Niacin and Penicillins  Electrocardiogram:  NSR rate 58 LAD RBBB   Assessment and Plan

## 2012-03-18 NOTE — Assessment & Plan Note (Signed)
Well controlled.  Continue current medications and low sodium Dash type diet.    

## 2012-03-18 NOTE — Assessment & Plan Note (Signed)
Stable with no angina and good activity level.  Continue medical Rx  

## 2012-03-19 ENCOUNTER — Other Ambulatory Visit: Payer: Self-pay | Admitting: Cardiovascular Disease

## 2012-03-24 LAB — HEPATIC FUNCTION PANEL
Total Bilirubin: 1.3 mg/dL — ABNORMAL HIGH (ref 0.3–1.2)
Total Protein: 6.3 g/dL (ref 6.0–8.3)

## 2012-03-24 LAB — LIPID PANEL
Cholesterol: 203 mg/dL — ABNORMAL HIGH (ref 0–200)
HDL: 32 mg/dL — ABNORMAL LOW (ref >39)
LDL Cholesterol: 117 mg/dL — ABNORMAL HIGH (ref 0–99)
Total CHOL/HDL Ratio: 6.3 Ratio
Triglycerides: 271 mg/dL — ABNORMAL HIGH (ref <150)
VLDL: 54 mg/dL — ABNORMAL HIGH (ref 0–40)

## 2012-03-30 ENCOUNTER — Other Ambulatory Visit: Payer: Self-pay | Admitting: *Deleted

## 2012-04-01 ENCOUNTER — Other Ambulatory Visit: Payer: Self-pay | Admitting: Cardiovascular Disease

## 2012-04-01 MED ORDER — ROSUVASTATIN CALCIUM 10 MG PO TABS: 10.0000 mg | ORAL_TABLET | Freq: Every day | ORAL | Status: DC

## 2012-04-01 NOTE — Progress Notes (Signed)
LEFT MESSAGE VYTORIN CHANGED TO CRESTOR 10 MG   [facility] [place]CY

## 2012-04-11 ENCOUNTER — Telehealth: Payer: Self-pay | Admitting: Cardiovascular Disease

## 2012-04-11 NOTE — Telephone Encounter (Signed)
[facility] CRESTOR [phone]

## 2012-04-11 NOTE — Telephone Encounter (Signed)
Pharmacy calling stating Joshua Gentry needs prior auth. To get crestor--LM at his work number to ask some ? About other statins he has tried, as i know they will ask--will wait until pt gets back to me

## 2012-04-11 NOTE — Telephone Encounter (Signed)
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 Paperwork filled out and signed by dr Eden Emms before they will consider an OK for crestor--application is in dr nishan's box

## 2012-04-15 NOTE — Telephone Encounter (Signed)
FORM FILLED OUT AND FAXED  AWAITING REPLY .Joshua Gentry

## 2012-04-22 NOTE — Telephone Encounter (Signed)
LEFT PT VOICE MAIL STATING THAT CRESTOR WAS APPROVED  PT MAY CALL BACK IF HAS  ANY QUESTIONS./CY

## 2012-08-24 ENCOUNTER — Other Ambulatory Visit: Payer: Self-pay | Admitting: *Deleted

## 2012-08-24 MED ORDER — HYDROCHLOROTHIAZIDE 25 MG PO TABS: ORAL_TABLET | ORAL | Status: DC

## 2013-03-06 ENCOUNTER — Other Ambulatory Visit: Payer: Self-pay | Admitting: *Deleted

## 2013-03-06 MED ORDER — METOPROLOL TARTRATE 100 MG PO TABS: ORAL_TABLET | ORAL | Status: DC

## 2013-03-06 NOTE — Telephone Encounter (Signed)
Refill sent.

## 2013-03-21 ENCOUNTER — Encounter: Payer: Self-pay | Admitting: Cardiovascular Disease

## 2013-03-21 ENCOUNTER — Ambulatory Visit (INDEPENDENT_AMBULATORY_CARE_PROVIDER_SITE_OTHER): Payer: Medicare Other | Admitting: Cardiovascular Disease

## 2013-03-21 VITALS — HR 55 | Resp 20 | Ht 73.0 in | Wt 287.0 lb

## 2013-03-21 DIAGNOSIS — I2510 Atherosclerotic heart disease of native coronary artery without angina pectoris: Secondary | ICD-10-CM | POA: Diagnosis not present

## 2013-03-21 DIAGNOSIS — I10 Essential (primary) hypertension: Secondary | ICD-10-CM

## 2013-03-21 DIAGNOSIS — E782 Mixed hyperlipidemia: Secondary | ICD-10-CM | POA: Diagnosis not present

## 2013-03-21 DIAGNOSIS — I452 Bifascicular block: Secondary | ICD-10-CM | POA: Insufficient documentation

## 2013-03-21 MED ORDER — LOSARTAN POTASSIUM 100 MG PO TABS: 100.0000 mg | ORAL_TABLET | Freq: Every day | ORAL | Status: DC

## 2013-03-21 MED ORDER — ROSUVASTATIN CALCIUM 10 MG PO TABS: 10.0000 mg | ORAL_TABLET | Freq: Every day | ORAL | Status: DC

## 2013-03-21 MED ORDER — HYDROCHLOROTHIAZIDE 25 MG PO TABS: ORAL_TABLET | ORAL | Status: DC

## 2013-03-21 MED ORDER — NITROGLYCERIN 0.4 MG SL SUBL: 0.4000 mg | SUBLINGUAL_TABLET | SUBLINGUAL | Status: DC | PRN

## 2013-03-21 MED ORDER — METOPROLOL TARTRATE 100 MG PO TABS: ORAL_TABLET | ORAL | Status: DC

## 2013-03-21 NOTE — Assessment & Plan Note (Signed)
Discussed exercise program and low carb diet Wife has food allergies and does not have a lot of "junk" around house

## 2013-03-21 NOTE — Addendum Note (Signed)
Addended by: Derry Lory A on: 03/21/2013 01:47 PM   Modules accepted: Orders

## 2013-03-21 NOTE — Assessment & Plan Note (Signed)
Active with no chest pain Stable

## 2013-03-21 NOTE — Assessment & Plan Note (Signed)
Cholesterol is at goal.  Continue current dose of statin and diet Rx.  No myalgias or side effects.  F/U  LFT's in 6 months. Lab Results  Component Value Date   LDLCALC 117* 03/24/2012  Needs lab work but not fasting today will order

## 2013-03-21 NOTE — Patient Instructions (Addendum)
Your physician recommends that you schedule a follow-up appointment in: ONE YEAR  Your physician recommends that you return for lab work in: THIS WEEK (SLIPS GIVEN FOR BMET,LIVER,LIPID) WE WILL CALL YOU WITH RESULTS

## 2013-03-21 NOTE — Assessment & Plan Note (Signed)
Well controlled.  Continue current medications and low sodium Dash type diet.    

## 2013-03-21 NOTE — Addendum Note (Signed)
Addended by: Derry Lory A on: 03/21/2013 01:15 PM   Modules accepted: Orders

## 2013-03-21 NOTE — Progress Notes (Signed)
Patient ID: Joshua Gentry, male   DOB: [birth year], 67 y.o.   MRN: [id number] Mr. Joshua Gentry returns today for followup. He had previous bypass in 1997. He last underwent a Myoview study on January 10 of 2008. It was normal. There were no EKG changes and his EF was 61%. In talking to the patient, he has been doing well in regards to his coronary disease. He has been active around the house and in the yard. He is golfing. He is not having significant chest pain, PND or orthopnea. There has been no syncope or palpitations. He is able to sleep at night without any shortness of breath.  ROS: Denies fever, malais, weight loss, blurry vision, decreased visual acuity, cough, sputum, SOB, hemoptysis, pleuritic pain, palpitaitons, heartburn, abdominal pain, melena, lower extremity edema, claudication, or rash.  All other systems reviewed and negative  General: Affect appropriate Healthy:  appears stated age HEENT: normal Neck supple with no adenopathy JVP normal no bruits no thyromegaly Lungs clear with no wheezing and good diaphragmatic motion Heart:  S1/S2 no murmur, no rub, gallop or click PMI normal Abdomen: benighn, BS positve, no tenderness, no AAA no bruit.  No HSM or HJR Distal pulses intact with no bruits No edema Neuro non-focal Skin warm and dry No muscular weakness   Current Outpatient Prescriptions  Medication Sig Dispense Refill  . aspirin 325 MG tablet Take 325 mg by mouth daily.        . calcium-vitamin D 250-100 MG-UNIT per tablet Take 1 tablet by mouth 2 (two) times daily.        . Coenzyme Q10 (CO Q 10) 100 MG CAPS Take 100 mg by mouth daily.        Marland Kitchen glucosamine-chondroitin 500-400 MG tablet Take 1 tablet by mouth 3 (three) times daily.        . hydrochlorothiazide (HYDRODIURIL) 25 MG tablet TAKE 1 TABLET EVERY DAY  30 tablet  3  . hydrochlorothiazide (HYDRODIURIL) 25 MG tablet TAKE 1 TABLET EVERY DAY  30 tablet  12  . losartan (COZAAR) 100 MG tablet Take 1 tablet (100 mg total)  by mouth daily.  30 tablet  12  . metoprolol (LOPRESSOR) 100 MG tablet TAKE 1 TABLET (100 MG TOTAL) BY MOUTH 2 (TWO) TIMES DAILY.  60 tablet  3  . Multiple Vitamins-Minerals (MULTIVITAMIN WITH MINERALS) tablet Take 1 tablet by mouth daily.        . nitroGLYCERIN (NITROSTAT) 0.4 MG SL tablet Place 0.4 mg under the tongue every 5 (five) minutes as needed.        . rosuvastatin (CRESTOR) 10 MG tablet Take 1 tablet (10 mg total) by mouth daily.  30 tablet  11  . vitamin C (ASCORBIC ACID) 500 MG tablet Take 500 mg by mouth daily.        . vitamin E 400 UNIT capsule Take 400 Units by mouth daily.         No current facility-administered medications for this visit.    Allergies  Niacin and Penicillins  Electrocardiogram:  03/18/12  SR rate 58  RBBB LAFB  Today SR rate 55 LAD RBBB no change   Assessment and Plan

## 2013-03-21 NOTE — Assessment & Plan Note (Signed)
Despite high dose lopressor he feels his HR is never too low ECG stable with no dizzyness or syncope Yearly ECG

## 2013-03-22 DIAGNOSIS — E782 Mixed hyperlipidemia: Secondary | ICD-10-CM | POA: Diagnosis not present

## 2013-03-22 DIAGNOSIS — I10 Essential (primary) hypertension: Secondary | ICD-10-CM | POA: Diagnosis not present

## 2013-03-22 DIAGNOSIS — I2510 Atherosclerotic heart disease of native coronary artery without angina pectoris: Secondary | ICD-10-CM | POA: Diagnosis not present

## 2013-03-22 LAB — LIPID PANEL
Cholesterol: 156 mg/dL (ref 0–200)
HDL: 33 mg/dL — ABNORMAL LOW (ref >39)
Total CHOL/HDL Ratio: 4.7 Ratio
Triglycerides: 163 mg/dL — ABNORMAL HIGH (ref <150)

## 2013-03-22 LAB — HEPATIC FUNCTION PANEL
Albumin: 3.9 g/dL (ref 3.5–5.2)
Bilirubin, Direct: 0.2 mg/dL (ref 0.0–0.3)
Total Bilirubin: 1 mg/dL (ref 0.3–1.2)

## 2013-03-22 LAB — BASIC METABOLIC PANEL
BUN: 14 mg/dL (ref 6–23)
Calcium: 9.1 mg/dL (ref 8.4–10.5)
Glucose, Bld: 108 mg/dL — ABNORMAL HIGH (ref 70–99)

## 2013-06-26 ENCOUNTER — Other Ambulatory Visit (HOSPITAL_COMMUNITY): Payer: Self-pay | Admitting: Family Medicine

## 2013-06-26 ENCOUNTER — Ambulatory Visit (HOSPITAL_COMMUNITY)
Admission: RE | Admit: 2013-06-26 | Discharge: 2013-06-26 | Disposition: A | Discharge status: Home / Self Care | Payer: Medicare Other | Source: Ambulatory Visit | Attending: Family Medicine | Admitting: Family Medicine

## 2013-06-26 DIAGNOSIS — I2510 Atherosclerotic heart disease of native coronary artery without angina pectoris: Secondary | ICD-10-CM

## 2013-06-26 DIAGNOSIS — Z951 Presence of aortocoronary bypass graft: Secondary | ICD-10-CM | POA: Diagnosis not present

## 2013-06-26 DIAGNOSIS — I10 Essential (primary) hypertension: Secondary | ICD-10-CM | POA: Diagnosis not present

## 2013-06-26 DIAGNOSIS — Z125 Encounter for screening for malignant neoplasm of prostate: Secondary | ICD-10-CM | POA: Diagnosis not present

## 2013-06-26 DIAGNOSIS — Z87891 Personal history of nicotine dependence: Secondary | ICD-10-CM | POA: Diagnosis not present

## 2013-06-26 DIAGNOSIS — Z23 Encounter for immunization: Secondary | ICD-10-CM | POA: Diagnosis not present

## 2013-06-26 DIAGNOSIS — Z0000 Encounter for general adult medical examination without abnormal findings: Secondary | ICD-10-CM | POA: Diagnosis not present

## 2013-06-26 DIAGNOSIS — J449 Chronic obstructive pulmonary disease, unspecified: Secondary | ICD-10-CM | POA: Diagnosis not present

## 2013-06-26 DIAGNOSIS — E785 Hyperlipidemia, unspecified: Secondary | ICD-10-CM | POA: Diagnosis not present

## 2013-06-26 DIAGNOSIS — K439 Ventral hernia without obstruction or gangrene: Secondary | ICD-10-CM

## 2013-06-26 DIAGNOSIS — Z79899 Other long term (current) drug therapy: Secondary | ICD-10-CM | POA: Diagnosis not present

## 2013-06-26 DIAGNOSIS — Z6837 Body mass index (BMI) 37.0-37.9, adult: Secondary | ICD-10-CM | POA: Diagnosis not present

## 2013-06-27 ENCOUNTER — Other Ambulatory Visit (HOSPITAL_COMMUNITY): Payer: Self-pay | Admitting: Family Medicine

## 2013-06-27 DIAGNOSIS — I2510 Atherosclerotic heart disease of native coronary artery without angina pectoris: Secondary | ICD-10-CM

## 2013-06-27 DIAGNOSIS — Z0000 Encounter for general adult medical examination without abnormal findings: Secondary | ICD-10-CM

## 2013-06-27 DIAGNOSIS — R1900 Intra-abdominal and pelvic swelling, mass and lump, unspecified site: Secondary | ICD-10-CM

## 2013-06-30 ENCOUNTER — Other Ambulatory Visit (HOSPITAL_COMMUNITY): Payer: Self-pay | Admitting: Family Medicine

## 2013-06-30 ENCOUNTER — Ambulatory Visit (HOSPITAL_COMMUNITY)
Admission: RE | Admit: 2013-06-30 | Discharge: 2013-06-30 | Disposition: A | Discharge status: Home / Self Care | Payer: Medicare Other | Source: Ambulatory Visit | Attending: Family Medicine | Admitting: Family Medicine

## 2013-06-30 DIAGNOSIS — I2510 Atherosclerotic heart disease of native coronary artery without angina pectoris: Secondary | ICD-10-CM

## 2013-06-30 DIAGNOSIS — R1900 Intra-abdominal and pelvic swelling, mass and lump, unspecified site: Secondary | ICD-10-CM | POA: Insufficient documentation

## 2013-06-30 DIAGNOSIS — Z136 Encounter for screening for cardiovascular disorders: Secondary | ICD-10-CM | POA: Diagnosis not present

## 2013-06-30 DIAGNOSIS — Z0000 Encounter for general adult medical examination without abnormal findings: Secondary | ICD-10-CM

## 2013-06-30 DIAGNOSIS — R109 Unspecified abdominal pain: Secondary | ICD-10-CM | POA: Diagnosis not present

## 2013-07-06 DIAGNOSIS — C499 Malignant neoplasm of connective and soft tissue, unspecified: Secondary | ICD-10-CM | POA: Insufficient documentation

## 2013-07-11 DIAGNOSIS — L820 Inflamed seborrheic keratosis: Secondary | ICD-10-CM | POA: Diagnosis not present

## 2013-07-11 DIAGNOSIS — L570 Actinic keratosis: Secondary | ICD-10-CM | POA: Diagnosis not present

## 2013-07-11 DIAGNOSIS — D235 Other benign neoplasm of skin of trunk: Secondary | ICD-10-CM | POA: Diagnosis not present

## 2013-07-18 ENCOUNTER — Encounter (HOSPITAL_COMMUNITY): Payer: Self-pay | Admitting: Pharmacy Technician

## 2013-07-18 DIAGNOSIS — Z86010 Personal history of colonic polyps: Secondary | ICD-10-CM | POA: Diagnosis not present

## 2013-07-18 NOTE — H&P (Signed)
  NTS SOAP Note  Vital Signs:  Vitals as of: 07/18/2013: Systolic 203: Diastolic 102: Heart Rate 69: Temp 67F: Height 80ft 1in: Weight 288Lbs 0 Ounces: BMI 38  BMI : 38 kg/m2  Subjective: This 67 Years 75 Months old Male presents for a colonoscopy.  Had a colon polyp removed about six years ago.  Denies any GI complaints. No family h/o colon cancer.  Review of Symptoms:  Constitutional:unremarkable   Head:unremarkable    Eyes:unremarkable   Nose/Mouth/Throat:unremarkable Cardiovascular:  unremarkable   Respiratory:  dyspnea Gastrointestinal:  unremarkable   Genitourinary:unremarkable     Musculoskeletal:unremarkable   Skin:unremarkable Hematolgic/Lymphatic:unremarkable     Allergic/Immunologic:unremarkable     Past Medical History:    Reviewed   Past Medical History  Surgical History: CABG, leg cancer resection Medical Problems: HTN, high cholesterol Allergies: pcn Medications: HCTZ, losartin, metoprolol, crestor, asa   Social History:Reviewed  Social History  Preferred Language: English Race:  White Ethnicity: Not Hispanic / Latino Age: 67 Years 5 Months Marital Status:  M   Smoking Status: Never smoker reviewed on 07/18/2013 Functional Status reviewed on mm/dd/yyyy ------------------------------------------------ Bathing: Normal Cooking: Normal Dressing: Normal Driving: Normal Eating: Normal Managing Meds: Normal Oral Care: Normal Shopping: Normal Toileting: Normal Transferring: Normal Walking: Normal Cognitive Status reviewed on mm/dd/yyyy ------------------------------------------------ Attention: Normal Decision Making: Normal Language: Normal Memory: Normal Motor: Normal Perception: Normal Problem Solving: Normal Visual and Spatial: Normal   Family History:  Reviewed  Family Health History Mother, Deceased; History Unknown Father, Deceased; Heart attack (myocardial infarction);      Objective Information: General:  Well appearing, well nourished in no distress. Heart:  RRR, no murmur Lungs:    CTA bilaterally, no wheezes, rhonchi, rales.  Breathing unlabored. Abdomen:Soft, NT/ND, no HSM, no masses.   deferred to procedure  Assessment:h/o colon polyp  Diagnosis &amp; Procedure Smart Code   Plan:Scheduled for TCS on 08/01/13.   Patient Education:Alternative treatments to surgery were discussed with patient (and family).  Risks and benefits  of procedure including bleeding and perforation were fully explained to the patient (and family) who gave informed consent. Patient/family questions were addressed.  Follow-up:Pending Surgery

## 2013-07-18 NOTE — H&P (Deleted)
  NTS SOAP Note  Vital Signs:  Vitals as of: 07/18/2013: Systolic 130: Diastolic 83: Heart Rate 86: Temp 98.6F: Height 5ft 11in: Weight 200Lbs 0 Ounces: Pain Level 2: BMI 27.89  BMI : 27.89 kg/m2  Subjective: This 44 Years 11 Months old Male presents for of abdominal pain.  Was seen in ER for right upper quadrant abdominal pain.  Has been occurring intermittently recently.  U/S of gallbladder reveals cholelithiasis, thickened gallbladder wall, normal common bile duct.  No fever, chills, jaundice.  Review of Symptoms:  Constitutional:  weakness Head:unremarkable    Eyes:  pain bilateral Nose/Mouth/Throat:unremarkable Cardiovascular:  unremarkable   Respiratory:unremarkable   Gastrointestin    abdominal pain,heartburn Genitourinary:unremarkable     Musculoskeletal:unremarkable   Skin:unremarkable Hematolgic/Lymphatic:unremarkable     Allergic/Immunologic:unremarkable     Past Medical History:    Reviewed   Past Medical History  Surgical History: appendectomy Medical Problems: none Allergies: nkda Medications: none   Social History:Reviewed  Social History  Preferred Language: English Race:  White Ethnicity: Not Hispanic / Latino Age: 67 Years 11 Months Marital Status:  M Alcohol: rarely Recreational drug(s):  No   Smoking Status: Current every day smoker reviewed on 07/18/2013 Started Date: 08/17/1988 Packs per day: 1.00 Functional Status reviewed on mm/dd/yyyy ------------------------------------------------ Bathing: Normal Cooking: Normal Dressing: Normal Driving: Normal Eating: Normal Managing Meds: Normal Oral Care: Normal Shopping: Normal Toileting: Normal Transferring: Normal Walking: Normal Cognitive Status reviewed on mm/dd/yyyy ------------------------------------------------ Attention: Normal Decision Making: Normal Language: Normal Memory: Normal Motor: Normal Perception: Normal Problem  Solving: Normal Visual and Spatial: Normal   Family History:  Reviewed  Family Health History Mother, Living; Healthy; healthy Father, Living; Healthy; healthy    Objective Information: General:  Well appearing, well nourished in no distress.   no scleral icterus Heart:  RRR, no murmur Lungs:    CTA bilaterally, no wheezes, rhonchi, rales.  Breathing unlabored. Abdomen:Soft, tender in right upper quadrant to palpation, ND, no HSM, no masses.  Assessment:cholecystitis, cholelithiasis  Diagnosis &amp; Procedure Smart Code   Plan:Scheduled for laparoscopic cholecystectomy on 07/21/13.   Patient Education:Alternative treatments to surgery were discussed with patient (and family).  Risks and benefits  of procedure including bleeding, infection, hepatobiliary injury, and the possibility of an open procedure were fully explained to the patient (and family) who gave informed consent. Patient/family questions were addressed.  Follow-up:Pending Surgery 

## 2013-07-27 DIAGNOSIS — R229 Localized swelling, mass and lump, unspecified: Secondary | ICD-10-CM | POA: Diagnosis not present

## 2013-07-27 DIAGNOSIS — C499 Malignant neoplasm of connective and soft tissue, unspecified: Secondary | ICD-10-CM | POA: Diagnosis not present

## 2013-07-27 DIAGNOSIS — C4920 Malignant neoplasm of connective and soft tissue of unspecified lower limb, including hip: Secondary | ICD-10-CM | POA: Diagnosis not present

## 2013-08-01 ENCOUNTER — Encounter (HOSPITAL_COMMUNITY): Payer: Self-pay | Admitting: *Deleted

## 2013-08-01 ENCOUNTER — Ambulatory Visit (HOSPITAL_COMMUNITY)
Admission: RE | Admit: 2013-08-01 | Discharge: 2013-08-01 | Disposition: A | Discharge status: Home / Self Care | Payer: Medicare Other | Source: Ambulatory Visit | Attending: General Surgery | Admitting: General Surgery

## 2013-08-01 ENCOUNTER — Encounter (HOSPITAL_COMMUNITY)
Admission: RE | Disposition: A | Discharge status: Home / Self Care | Payer: Self-pay | Source: Ambulatory Visit | Attending: General Surgery

## 2013-08-01 DIAGNOSIS — Z951 Presence of aortocoronary bypass graft: Secondary | ICD-10-CM | POA: Insufficient documentation

## 2013-08-01 DIAGNOSIS — Z7982 Long term (current) use of aspirin: Secondary | ICD-10-CM | POA: Diagnosis not present

## 2013-08-01 DIAGNOSIS — Z860100 Personal history of colon polyps, unspecified: Secondary | ICD-10-CM | POA: Insufficient documentation

## 2013-08-01 DIAGNOSIS — I10 Essential (primary) hypertension: Secondary | ICD-10-CM | POA: Insufficient documentation

## 2013-08-01 DIAGNOSIS — Z79899 Other long term (current) drug therapy: Secondary | ICD-10-CM | POA: Diagnosis not present

## 2013-08-01 DIAGNOSIS — E7800 Pure hypercholesterolemia, unspecified: Secondary | ICD-10-CM | POA: Insufficient documentation

## 2013-08-01 HISTORY — PX: COLONOSCOPY: SHX5424

## 2013-08-01 HISTORY — DX: Malignant (primary) neoplasm, unspecified: C80.1

## 2013-08-01 SURGERY — COLONOSCOPY
Anesthesia: Moderate Sedation

## 2013-08-01 MED ORDER — SODIUM CHLORIDE 0.9 % IV SOLN
INTRAVENOUS | Status: DC
  Administered 2013-08-01: 1000 mL via INTRAVENOUS

## 2013-08-01 MED ORDER — MIDAZOLAM HCL 5 MG/5ML IJ SOLN
INTRAMUSCULAR | Status: DC | PRN
  Administered 2013-08-01: 4 mg via INTRAVENOUS

## 2013-08-01 MED ORDER — MEPERIDINE HCL 50 MG/ML IJ SOLN
INTRAMUSCULAR | Status: DC | PRN
  Administered 2013-08-01: 50 mg via INTRAVENOUS

## 2013-08-01 MED ORDER — MIDAZOLAM HCL 5 MG/5ML IJ SOLN
INTRAMUSCULAR | Status: AC
  Filled 2013-08-01: qty 5

## 2013-08-01 MED ORDER — MEPERIDINE HCL 50 MG/ML IJ SOLN
INTRAMUSCULAR | Status: AC
  Filled 2013-08-01: qty 1

## 2013-08-01 MED ORDER — STERILE WATER FOR IRRIGATION IR SOLN
Status: DC | PRN
  Administered 2013-08-01: 09:00:00

## 2013-08-01 NOTE — Op Note (Signed)
[facility] [address] [place], [address]   COLONOSCOPY PROCEDURE REPORT  PATIENT: Joshua Gentry, Joshua Gentry  MR#: [id number] BIRTHDATE: 1945-08-25 , 67  yrs. old GENDER: Male ENDOSCOPIST: Franky Macho, MD REFERRED EX:[id number], John PROCEDURE DATE:  08/01/2013 PROCEDURE:   Colonoscopy, surveillance ASA CLASS:   Class II INDICATIONS:Patient's personal history of colon polyps. MEDICATIONS: Versed 4 mg IV and Demerol 50 mg IV  DESCRIPTION OF PROCEDURE:   After the risks benefits and alternatives of the procedure were thoroughly explained, informed consent was obtained.  A digital rectal exam revealed no abnormalities of the rectum.   The EC-[id number] ([id number])  endoscope was introduced through the anus and advanced to the cecum, which was identified by both the appendix and ileocecal valve. No adverse events experienced.   The quality of the prep was adequate, using MoviPrep  The instrument was then slowly withdrawn as the colon was fully examined.      COLON FINDINGS: A normal appearing cecum, ileocecal valve, and appendiceal orifice were identified.  The ascending, hepatic flexure, transverse, splenic flexure, descending, sigmoid colon and rectum appeared unremarkable.  No polyps or cancers were seen. Retroflexed views revealed no abnormalities. The time to cecum=6 minutes 0 seconds.  Withdrawal time=4 minutes 0 seconds.  The scope was withdrawn and the procedure completed. COMPLICATIONS: There were no complications.  ENDOSCOPIC IMPRESSION: Normal colon  RECOMMENDATIONS: Repeat Colonscopy in 10 years.   eSigned:  Franky Macho, MD 08/01/2013 9:01 AM   cc:

## 2013-08-01 NOTE — Interval H&P Note (Signed)
History and Physical Interval Note:  08/01/2013 8:33 AM  Jenness Corner  has presented today for surgery, with the diagnosis of colon polyp  The various methods of treatment have been discussed with the patient and family. After consideration of risks, benefits and other options for treatment, the patient has consented to  Procedure(s): COLONOSCOPY (N/A) as a surgical intervention .  The patient's history has been reviewed, patient examined, no change in status, stable for surgery.  I have reviewed the patient's chart and labs.  Questions were answered to the patient's satisfaction.     Franky Macho A

## 2013-08-04 ENCOUNTER — Encounter (HOSPITAL_COMMUNITY): Payer: Self-pay | Admitting: General Surgery

## 2013-10-06 ENCOUNTER — Other Ambulatory Visit: Payer: Medicare Other

## 2014-03-28 ENCOUNTER — Encounter: Payer: Self-pay | Admitting: Cardiovascular Disease

## 2014-03-28 ENCOUNTER — Ambulatory Visit (INDEPENDENT_AMBULATORY_CARE_PROVIDER_SITE_OTHER): Payer: Medicare Other | Admitting: Cardiovascular Disease

## 2014-03-28 VITALS — BP 142/88 | HR 58 | Ht 73.0 in | Wt 289.0 lb

## 2014-03-28 DIAGNOSIS — I10 Essential (primary) hypertension: Secondary | ICD-10-CM

## 2014-03-28 DIAGNOSIS — E785 Hyperlipidemia, unspecified: Secondary | ICD-10-CM | POA: Diagnosis not present

## 2014-03-28 DIAGNOSIS — Z7182 Exercise counseling: Secondary | ICD-10-CM | POA: Diagnosis not present

## 2014-03-28 DIAGNOSIS — I25812 Atherosclerosis of bypass graft of coronary artery of transplanted heart without angina pectoris: Secondary | ICD-10-CM | POA: Diagnosis not present

## 2014-03-28 MED ORDER — LOSARTAN POTASSIUM 100 MG PO TABS: 100.0000 mg | ORAL_TABLET | Freq: Every day | ORAL | Status: DC

## 2014-03-28 MED ORDER — METOPROLOL TARTRATE 100 MG PO TABS: ORAL_TABLET | ORAL | Status: DC

## 2014-03-28 MED ORDER — HYDROCHLOROTHIAZIDE 25 MG PO TABS: ORAL_TABLET | ORAL | Status: DC

## 2014-03-28 MED ORDER — ROSUVASTATIN CALCIUM 10 MG PO TABS: 10.0000 mg | ORAL_TABLET | Freq: Every day | ORAL | Status: DC

## 2014-03-28 MED ORDER — NITROGLYCERIN 0.4 MG SL SUBL: 0.4000 mg | SUBLINGUAL_TABLET | SUBLINGUAL | Status: DC | PRN

## 2014-03-28 NOTE — Addendum Note (Signed)
Addended by: Barbarann Ehlers A on: 03/28/2014 10:54 AM   Modules accepted: Orders

## 2014-03-28 NOTE — Patient Instructions (Signed)
Your physician wants you to follow-up in: 1 year You will receive a reminder letter in the mail two months in advance. If you don't receive a letter, please call our office to schedule the follow-up appointment.   Please decrease Aspirin to 81 mg daily   Please get FASTING blood work done now ( Lipid,LFT'S)    Thank you for choosing [facility] !

## 2014-03-28 NOTE — Progress Notes (Signed)
Patient ID: Joshua Gentry, male   DOB: 25-Jun-1946, 68 y.o.   MRN: [id number]      SUBJECTIVE: The patient is a 68 year old male with a history of coronary artery disease and CABG in 1997, as well as essential hypertension and hyperlipidemia. 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 He use to use his treadmill 3-4 days per week but now only uses it 1 day per week. He denies chest pain, shortness of breath, palpitations and leg swelling and says that he feels well. He quit smoking in 1997.  ECG performed in the office today demonstrates normal sinus rhythm with a right bundle branch block and left anterior fascicular block.  Review of Systems: As per "subjective", otherwise negative.  Allergies  Allergen Reactions  . Niacin   . Penicillins     Current Outpatient Prescriptions  Medication Sig Dispense Refill  . aspirin 325 MG tablet Take 325 mg by mouth daily.        . calcium-vitamin D 250-100 MG-UNIT per tablet Take 1 tablet by mouth 2 (two) times daily.        . Coenzyme Q10 (CO Q 10) 100 MG CAPS Take 100 mg by mouth daily.        Marland Kitchen glucosamine-chondroitin 500-400 MG tablet Take 1 tablet by mouth daily.       . hydrochlorothiazide (HYDRODIURIL) 25 MG tablet TAKE 1 TABLET EVERY DAY  30 tablet  12  . losartan (COZAAR) 100 MG tablet Take 1 tablet (100 mg total) by mouth daily.  30 tablet  12  . Magnesium 400 MG CAPS Take by mouth.      . metoprolol (LOPRESSOR) 100 MG tablet TAKE 1 TABLET (100 MG TOTAL) BY MOUTH 2 (TWO) TIMES DAILY.  60 tablet  12  . Multiple Vitamins-Minerals (MULTIVITAMIN WITH MINERALS) tablet Take 1 tablet by mouth daily.        . nitroGLYCERIN (NITROSTAT) 0.4 MG SL tablet Place 1 tablet (0.4 mg total) under the tongue every 5 (five) minutes as needed.  25 tablet  6  . POTASSIUM BICARBONATE PO Take by mouth.      . rosuvastatin (CRESTOR) 10 MG tablet Take 1 tablet (10 mg total) by mouth daily.  30 tablet  11  . vitamin C (ASCORBIC  ACID) 500 MG tablet Take 500 mg by mouth daily.        . vitamin E 400 UNIT capsule Take 400 Units by mouth daily.         No current facility-administered medications for this visit.    Past Medical History  Diagnosis Date  . Hyperlipidemia   . Coronary artery disease   . Hypertension   . Shortness of breath   . 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 Coronary artery bypass graft      x4  . Colonoscopy N/A 08/01/2013    Procedure: COLONOSCOPY;  Surgeon: Jamesetta So, MD;  Location: [facility];  Service: Gastroenterology;  Laterality: N/A;    History   Social History  . Marital Status: Married    Spouse Name: N/A    Number of Children: N/A  . Years of Education: N/A   Occupational History  . Dance movement psychotherapist    Social History Main Topics  . Smoking status: Former Smoker -- 1.50 packs/day for 32 years    Types: Cigarettes    Quit date: 08/17/1996  .  Smokeless tobacco: Not on file  . Alcohol Use: No  . Drug Use: No  . Sexual Activity: Not on file   Other Topics Concern  . Not on file   Social History Narrative   Married with 3 children   No regular exercise     Filed Vitals:   03/28/14 1013  BP: 142/88  Pulse: 58  Height: 6\' 1"  (1.854 m)  Weight: 289 lb (131.09 kg)  SpO2: 97%    PHYSICAL EXAM General: NAD Neck: No JVD, no thyromegaly. Lungs: Clear to auscultation bilaterally with normal respiratory effort. CV: Nondisplaced PMI.  Regular rate and rhythm, normal S1/S2, no S3/S4, no murmur. No pretibial or periankle edema.  No carotid bruit.  Normal pedal pulses.  Abdomen: Soft, nontender, no hepatosplenomegaly, no distention.  Neurologic: Alert and oriented x 3.  Psych: Normal affect. Extremities: No clubbing or cyanosis.   ECG: reviewed and available in electronic records.      ASSESSMENT AND PLAN: 1. CAD/CABG: Symptomatically stable. Reduce ASA to 81 mg daily. Continue beta blocker  and Crestor. ECG as noted above.   2. Essential HTN: Mildly elevated today. Currently taking hydrochlorothiazide 25 mg daily, losartan 100 mg daily and metoprolol 100 mg twice daily. I have encouraged therapeutic lifestyle changes by increasing moderate exercise to 30 minutes 4-5 times per week. Will monitor.  3. Hyperlipidemia: Check lipids and LFT's. Continue Crestor 10 mg daily.  Dispo: f/u 1 year.  Kate Sable, M.D., F.A.C.C.

## 2014-03-30 DIAGNOSIS — E785 Hyperlipidemia, unspecified: Secondary | ICD-10-CM | POA: Diagnosis not present

## 2014-03-30 LAB — HEPATIC FUNCTION PANEL
ALBUMIN: 3.9 g/dL (ref 3.5–5.2)
ALK PHOS: 72 U/L (ref 39–117)
ALT: 25 U/L (ref 0–53)
AST: 21 U/L (ref 0–37)
BILIRUBIN TOTAL: 1.4 mg/dL — AB (ref 0.2–1.2)
Bilirubin, Direct: 0.2 mg/dL (ref 0.0–0.3)
Indirect Bilirubin: 1.2 mg/dL (ref 0.2–1.2)
TOTAL PROTEIN: 6.3 g/dL (ref 6.0–8.3)

## 2014-03-30 LAB — LIPID PANEL
CHOL/HDL RATIO: 4.1 ratio
Cholesterol: 139 mg/dL (ref 0–200)
HDL: 34 mg/dL — AB (ref >39)
LDL CALC: 72 mg/dL (ref 0–99)
TRIGLYCERIDES: 167 mg/dL — AB (ref <150)
VLDL: 33 mg/dL (ref 0–40)

## 2014-06-28 DIAGNOSIS — H25013 Cortical age-related cataract, bilateral: Secondary | ICD-10-CM | POA: Diagnosis not present

## 2014-06-28 DIAGNOSIS — H2513 Age-related nuclear cataract, bilateral: Secondary | ICD-10-CM | POA: Diagnosis not present

## 2014-07-31 DIAGNOSIS — Z23 Encounter for immunization: Secondary | ICD-10-CM | POA: Diagnosis not present

## 2014-07-31 DIAGNOSIS — C4921 Malignant neoplasm of connective and soft tissue of right lower limb, including hip: Secondary | ICD-10-CM | POA: Diagnosis not present

## 2014-07-31 DIAGNOSIS — Z9889 Other specified postprocedural states: Secondary | ICD-10-CM | POA: Diagnosis not present

## 2014-07-31 DIAGNOSIS — C499 Malignant neoplasm of connective and soft tissue, unspecified: Secondary | ICD-10-CM | POA: Diagnosis not present

## 2014-08-22 DIAGNOSIS — H2511 Age-related nuclear cataract, right eye: Secondary | ICD-10-CM | POA: Diagnosis not present

## 2014-08-22 DIAGNOSIS — H25011 Cortical age-related cataract, right eye: Secondary | ICD-10-CM | POA: Diagnosis not present

## 2014-08-29 DIAGNOSIS — H25012 Cortical age-related cataract, left eye: Secondary | ICD-10-CM | POA: Diagnosis not present

## 2014-08-29 DIAGNOSIS — H2511 Age-related nuclear cataract, right eye: Secondary | ICD-10-CM | POA: Diagnosis not present

## 2014-08-29 DIAGNOSIS — H25011 Cortical age-related cataract, right eye: Secondary | ICD-10-CM | POA: Diagnosis not present

## 2014-08-29 DIAGNOSIS — H2512 Age-related nuclear cataract, left eye: Secondary | ICD-10-CM | POA: Diagnosis not present

## 2014-09-05 DIAGNOSIS — H25012 Cortical age-related cataract, left eye: Secondary | ICD-10-CM | POA: Diagnosis not present

## 2014-09-05 DIAGNOSIS — H2512 Age-related nuclear cataract, left eye: Secondary | ICD-10-CM | POA: Diagnosis not present

## 2014-10-11 ENCOUNTER — Other Ambulatory Visit (HOSPITAL_COMMUNITY): Payer: Self-pay | Admitting: Family Medicine

## 2014-10-11 DIAGNOSIS — Z6837 Body mass index (BMI) 37.0-37.9, adult: Secondary | ICD-10-CM | POA: Diagnosis not present

## 2014-10-11 DIAGNOSIS — Z136 Encounter for screening for cardiovascular disorders: Secondary | ICD-10-CM

## 2014-10-11 DIAGNOSIS — Z23 Encounter for immunization: Secondary | ICD-10-CM | POA: Diagnosis not present

## 2014-10-11 DIAGNOSIS — Z139 Encounter for screening, unspecified: Secondary | ICD-10-CM

## 2014-10-11 DIAGNOSIS — Z125 Encounter for screening for malignant neoplasm of prostate: Secondary | ICD-10-CM | POA: Diagnosis not present

## 2014-10-11 DIAGNOSIS — Z1389 Encounter for screening for other disorder: Secondary | ICD-10-CM | POA: Diagnosis not present

## 2014-10-11 DIAGNOSIS — Z0000 Encounter for general adult medical examination without abnormal findings: Secondary | ICD-10-CM | POA: Diagnosis not present

## 2014-10-11 DIAGNOSIS — E782 Mixed hyperlipidemia: Secondary | ICD-10-CM | POA: Diagnosis not present

## 2014-10-16 DIAGNOSIS — G4733 Obstructive sleep apnea (adult) (pediatric): Secondary | ICD-10-CM

## 2014-10-16 HISTORY — DX: Obstructive sleep apnea (adult) (pediatric): G47.33

## 2014-10-17 ENCOUNTER — Ambulatory Visit (HOSPITAL_COMMUNITY): Payer: Medicare Other

## 2014-10-23 ENCOUNTER — Other Ambulatory Visit (HOSPITAL_COMMUNITY): Payer: Self-pay | Admitting: Family Medicine

## 2014-10-23 ENCOUNTER — Ambulatory Visit (HOSPITAL_COMMUNITY)
Admission: RE | Admit: 2014-10-23 | Discharge: 2014-10-23 | Disposition: A | Discharge status: Home / Self Care | Payer: Medicare Other | Source: Ambulatory Visit | Attending: Family Medicine | Admitting: Family Medicine

## 2014-10-23 DIAGNOSIS — Z136 Encounter for screening for cardiovascular disorders: Secondary | ICD-10-CM

## 2014-10-23 DIAGNOSIS — E7800 Pure hypercholesterolemia, unspecified: Secondary | ICD-10-CM

## 2014-10-23 DIAGNOSIS — Z139 Encounter for screening, unspecified: Secondary | ICD-10-CM

## 2014-10-23 DIAGNOSIS — I10 Essential (primary) hypertension: Secondary | ICD-10-CM | POA: Insufficient documentation

## 2014-11-16 HISTORY — PX: OTHER SURGICAL HISTORY: SHX169

## 2014-11-29 ENCOUNTER — Ambulatory Visit (INDEPENDENT_AMBULATORY_CARE_PROVIDER_SITE_OTHER): Payer: Medicare Other | Admitting: Neurology

## 2014-11-29 ENCOUNTER — Encounter: Payer: Self-pay | Admitting: Neurology

## 2014-11-29 VITALS — BP 153/85 | HR 58 | Temp 97.8°F | Resp 18 | Ht 73.0 in | Wt 286.0 lb

## 2014-11-29 DIAGNOSIS — E669 Obesity, unspecified: Secondary | ICD-10-CM | POA: Diagnosis not present

## 2014-11-29 DIAGNOSIS — I252 Old myocardial infarction: Secondary | ICD-10-CM

## 2014-11-29 DIAGNOSIS — Z951 Presence of aortocoronary bypass graft: Secondary | ICD-10-CM | POA: Diagnosis not present

## 2014-11-29 DIAGNOSIS — G4733 Obstructive sleep apnea (adult) (pediatric): Secondary | ICD-10-CM | POA: Diagnosis not present

## 2014-11-29 DIAGNOSIS — G4761 Periodic limb movement disorder: Secondary | ICD-10-CM

## 2014-11-29 DIAGNOSIS — G2581 Restless legs syndrome: Secondary | ICD-10-CM

## 2014-11-29 NOTE — Progress Notes (Signed)
Subjective:    Patient ID: Joshua Gentry is a 69 y.o. male.  HPI     Star Age, MD, PhD [facility] [address], [address] [place], [place] [address]  Dear Dr. Hilma Favors,   I saw your patient, Joshua Gentry, upon your kind request in my neurologic clinic today for initial consultation of his sleep disorder, in particular, concern for underlying obstructive sleep apnea. The patient is accompanied by today. As you know, [name] is a very pleasant 69 year old right-handed gentleman with an underlying medical history of obesity, coronary artery disease, s/p MI in 66, status post four-vessel CABG, and followed by Dr. Bronson Ing, hypertension, hyperlipidemia, and former smoking, who reports snoring, excessive daytime somnolence and witnessed apneas per wife. He complains of nonrestorative sleep and sleep disruption. His sleep is not as good since his MI in 44. While he goes to sleep okay he has sleep disruption and often has a very difficult time going back to sleep once he is up in the middle of the night. He has Sx of RLS, including wiggling his feet, worse at night. He has nocturia x 1, occasional morning HAs.  His typical bedtime is reported to be around 10 PM and wake time varies.he has daytime somnolence. His Epworth sleepiness score is 14 out of 24. His fatigue scores 43. 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 He also likes to play golf in his spare time. 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 He quit smoking when he had his heart attack. He rarely drinks alcohol and drinks caffeine in the form of sodas approximately 2 times per day. He does not endorse any parasomnias. He has never fallen asleep at the wheel.   He has never had a sleep study. His mother was diagnosed in her later years with obstructive sleep apnea and tried CPAP but could not tolerate it. She was in her 19s at the time.  He has 3  grown children, all in the area.  He has occasional exertional shortness of breath but no chest pains.  His Past Medical History Is Significant For: Past Medical History  Diagnosis Date  . Hyperlipidemia   . Coronary artery disease   . Hypertension   . Shortness of breath   . 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 Heart attack     His Past Surgical History Is Significant For: Past Surgical History  Procedure Laterality Date  . Coronary artery bypass graft      x4  . Colonoscopy N/A 08/01/2013    Procedure: COLONOSCOPY;  Surgeon: Jamesetta So, MD;  Location: [facility];  Service: Gastroenterology;  Laterality: N/A;  . Lower leg soft tissue tumor excision      His Family History Is Significant For: Family History  Problem Relation Age of Onset  . Hypertension Mother   . Diabetes Mother   . Heart attack Father   . Diabetes Sister   . Diabetes Sister     His Social History Is Significant For: History   Social History  . Marital Status: Married    Spouse Name: N/A  . Number of Children: 3  . Years of Education: College   Occupational History  . Dance movement psychotherapist    Social History Main Topics  . Smoking status: Former Smoker -- 1.50 packs/day for 32 years    Types: Cigarettes    Quit date: 08/17/1996  .  Smokeless tobacco: Not on file  . Alcohol Use: 0.0 oz/week    0 Standard drinks or equivalent per week     Comment: Occasional   . Drug Use: No  . Sexual Activity: Not on file   Other Topics Concern  . None   Social History Narrative   Married with 3 children   No regular exercise   Drinks about 48oz of coke a day     His Allergies Are:  Allergies  Allergen Reactions  . Niacin   . Penicillins   :   His Current Medications Are:  Outpatient Encounter Prescriptions as of 11/29/2014  Medication Sig  . aspirin 81 MG tablet Take 81 mg by mouth daily.  . calcium-vitamin D 250-100 MG-UNIT per tablet Take 1 tablet by mouth 2 (two) times daily.    .  Coenzyme Q10 (CO Q 10) 100 MG CAPS Take 100 mg by mouth daily.    Marland Kitchen glucosamine-chondroitin 500-400 MG tablet Take 1 tablet by mouth daily.   . hydrochlorothiazide (HYDRODIURIL) 25 MG tablet TAKE 1 TABLET EVERY DAY  . losartan (COZAAR) 100 MG tablet Take 1 tablet (100 mg total) by mouth daily.  . Magnesium 400 MG CAPS Take by mouth.  . metoprolol (LOPRESSOR) 100 MG tablet TAKE 1 TABLET (100 MG TOTAL) BY MOUTH 2 (TWO) TIMES DAILY.  . Multiple Vitamins-Minerals (MULTIVITAMIN WITH MINERALS) tablet Take 1 tablet by mouth daily.    . nitroGLYCERIN (NITROSTAT) 0.4 MG SL tablet Place 1 tablet (0.4 mg total) under the tongue every 5 (five) minutes as needed.  Marland Kitchen POTASSIUM BICARBONATE PO Take by mouth.  . rosuvastatin (CRESTOR) 10 MG tablet Take 1 tablet (10 mg total) by mouth daily.  . vitamin C (ASCORBIC ACID) 500 MG tablet Take 500 mg by mouth daily.    . vitamin E 400 UNIT capsule Take 400 Units by mouth daily.    :  Review of Systems:  Out of a complete 14 point review of systems, all are reviewed and negative with the exception of these symptoms as listed below:   Review of Systems  Constitutional: Positive for fatigue.  Respiratory:       Shortness of breath, Snoring   Neurological:       Sleepiness, Wife reports patient "skips breaths during sleep", wakes up many times during night, wakes up feeling tired in the morning, takes naps during day.     Objective:  Neurologic Exam  Physical Exam  Physical Examination:   Filed Vitals:   11/29/14 0906  BP: 153/85  Pulse: 58  Temp: 97.8 F (36.6 C)  Resp: 18    General Examination: The patient is a very pleasant 69 y.o. male in no acute distress. He appears well-developed and well-nourished and well groomed.   HEENT: Normocephalic, atraumatic, pupils are equal, round and reactive to light and accommodation. Funduscopic exam is normal with sharp disc margins noted. Extraocular tracking is good without limitation to gaze excursion or  nystagmus noted. Normal smooth pursuit is noted. Hearing is grossly intact. Tympanic membranes are clear bilaterally. Face is symmetric with normal facial animation and normal facial sensation. Speech is clear with no dysarthria noted. There is no hypophonia. There is no lip, neck/head, jaw or voice tremor. Neck is supple with full range of passive and active motion. There are no carotid bruits on auscultation. Oropharynx exam reveals: mild mouth dryness, good dental hygiene and moderate airway crowding, due to larger uvula, redundant soft palate, wider tongue. Mallampati is class II.  Tongue protrudes centrally and palate elevates symmetrically. Tonsils are absent. Neck size is 19 inches. He has a Mild overbite. Nasal inspection reveals no significant nasal mucosal bogginess  that he has a deviated septum. He said he fractured his nose in the past.   Chest: Clear to auscultation without wheezing, rhonchi or crackles noted.  Heart: S1+S2+0, regular and normal without murmurs, rubs or gallops noted.   Abdomen: Soft, non-tender and non-distended with normal bowel sounds appreciated on auscultation.  Extremities: There is no pitting edema in the distal lower extremities bilaterally. Pedal pulses are intact.  Skin: Warm and dry without trophic changes noted.  he has a irritated area on his nose. He has an appointment to be made with dermatology. There are no varicose veins. He has dry scaly patches on his scalp. He has partial baldness.  Musculoskeletal: exam reveals no obvious joint deformities, tenderness or joint swelling or erythema.   Neurologically:  Mental status: The patient is awake, alert and oriented in all 4 spheres. His immediate and remote memory, attention, language skills and fund of knowledge are appropriate. There is no evidence of aphasia, agnosia, apraxia or anomia. Speech is clear with normal prosody and enunciation. Thought process is linear. Mood is normal and affect is normal.   Cranial nerves II - XII are as described above under HEENT exam. In addition: shoulder shrug is normal with equal shoulder height noted. Motor exam: Normal bulk, strength and tone is noted. There is no drift, tremor or rebound. Romberg is negative. Reflexes are 2+ throughout. Babinski: Toes are flexor bilaterally. Fine motor skills and coordination: intact with normal finger taps, normal hand movements, normal rapid alternating patting, normal foot taps and normal foot agility.  Cerebellar testing: No dysmetria or intention tremor on finger to nose testing. Heel to shin is unremarkable bilaterally. There is no truncal or gait ataxia.  Sensory exam: intact to light touch, pinprick, vibration, temperature sense in the upper and lower extremities.  Gait, station and balance: He stands easily. No veering to one side is noted. No leaning to one side is noted. Posture is age-appropriate and stance is narrow based. Gait shows normal stride length and normal pace. No problems turning are noted. He turns en bloc. Tandem walk is  slightly difficult for him.   Assessment and Plan:   In summary, LEGION DISCHER is a very pleasant 69 y.o.-year old male with an underlying medical history of obesity, coronary artery disease, s/p MI in 47, status post four-vessel CABG, and followed by Dr. Bronson Ing, hypertension, hyperlipidemia, and former smoking, who reports snoring, excessive daytime somnolence and witnessed apneas per wife. He complains of nonrestorative sleep and sleep disruption.  his history and physical exam are in keeping with obstructive sleep apnea (OSA). I had a long chat with the patient about my findings and the diagnosis of OSA, its prognosis and treatment options. We talked about medical treatments, surgical interventions and non-pharmacological approaches. I explained in particular the risks and ramifications of untreated moderate to severe OSA, especially with respect to developing cardiovascular  disease down the Road, including congestive heart failure, difficult to treat hypertension, cardiac arrhythmias, or stroke. Even type 2 diabetes has, in part, been linked to untreated OSA. Symptoms of untreated OSA include daytime sleepiness, memory problems, mood irritability and mood disorder such as depression and anxiety, lack of energy, as well as recurrent headaches, especially morning headaches. We talked about trying to maintain a healthy lifestyle in general, as well as the importance of  weight control. I encouraged the patient to eat healthy, exercise daily and keep well hydrated, to keep a scheduled bedtime and wake time routine, to not skip any meals and eat healthy snacks in between meals. I advised the patient not to drive when feeling sleepy. I recommended the following at this time: sleep study with potential positive airway pressure titration. (We will score hypopneas at 4% and split the sleep study into diagnostic and treatment portion, if the estimated. 2 hour AHI is >15/h). Given his medical history of coronary artery disease with myocardial infarction and status post bypass surgery, it will be important for him to be on treatment for obstructive sleep apnea.  I explained the sleep test procedure to the patient and also outlined possible surgical and non-surgical treatment options of OSA, including the use of a custom-made dental device (which would require a referral to a specialist dentist or oral surgeon), upper airway surgical options, such as pillar implants, radiofrequency surgery, tongue base surgery, and UPPP (which would involve a referral to an ENT surgeon). Rarely, jaw surgery such as mandibular advancement may be considered.  I also explained the CPAP treatment option to the patient, who indicated that he would be willing to try CPAP if the need arises. I explained the importance of being compliant with PAP treatment, not only for insurance purposes but primarily to improve His  symptoms, and for the patient's long term health benefit, including to reduce His cardiovascular risks. I answered all his questions today and the patient was in agreement. I would like to see him back after the sleep study is completed and encouraged him to call with any interim questions, concerns, problems or updates.   Thank you very much for allowing me to participate in the care of this nice patient. If I can be of any further assistance to you please do not hesitate to call me at [phone].  Sincerely,   Star Age, MD, PhD

## 2014-11-29 NOTE — Patient Instructions (Signed)

## 2014-12-06 ENCOUNTER — Ambulatory Visit (INDEPENDENT_AMBULATORY_CARE_PROVIDER_SITE_OTHER): Payer: Medicare Other | Admitting: Neurology

## 2014-12-06 VITALS — BP 172/89 | HR 56 | Ht 73.0 in | Wt 286.0 lb

## 2014-12-06 DIAGNOSIS — G4733 Obstructive sleep apnea (adult) (pediatric): Secondary | ICD-10-CM | POA: Diagnosis not present

## 2014-12-06 DIAGNOSIS — G4710 Hypersomnia, unspecified: Secondary | ICD-10-CM

## 2014-12-06 DIAGNOSIS — R9431 Abnormal electrocardiogram [ECG] [EKG]: Secondary | ICD-10-CM

## 2014-12-06 DIAGNOSIS — G4730 Sleep apnea, unspecified: Secondary | ICD-10-CM

## 2014-12-07 NOTE — Sleep Study (Signed)
See scanned documents in Encounters tab

## 2014-12-19 ENCOUNTER — Telehealth: Payer: Self-pay | Admitting: Neurology

## 2014-12-19 DIAGNOSIS — G4733 Obstructive sleep apnea (adult) (pediatric): Secondary | ICD-10-CM

## 2014-12-19 NOTE — Telephone Encounter (Signed)
Beverlee Nims:   Please call and notify patient that the recent sleep study confirmed the diagnosis of OSA. He did well with BiPAP during the study with significant improvement of the respiratory events. Therefore, I would like start the patient on BiPAP at home. I placed the order in the chart. The patient will need a follow up appointment with me in 8 to 10 weeks post set up that has to be scheduled; please go ahead and schedule while you have the patient on the phone and make sure patient understands the importance of keeping this window for the FU appointment, as it is often an insurance requirement and failing to adhere to this may result in losing coverage for sleep apnea treatment. Please arrange for BiPAP set up at home through a DME company of patient's choice and fax/route report to PCP and referring MD (if other than PCP).   Once you have spoken to patient and scheduled the FU appt (or the new patient consultation), you can close this encounter, thanks,   Star Age, MD, PhD [facility] ([place])

## 2014-12-20 NOTE — Telephone Encounter (Signed)
Patient is aware of results and would like to start BiPap use at home. I will send order to [facility], and fax sleep study to referring doctor. We scheduled f/u for 02/21/15. He is aware to have appt between 30-60days of use. I will also send reminder letter.

## 2015-01-10 DIAGNOSIS — Z961 Presence of intraocular lens: Secondary | ICD-10-CM | POA: Diagnosis not present

## 2015-01-29 ENCOUNTER — Encounter: Payer: Self-pay | Admitting: Neurology

## 2015-02-21 ENCOUNTER — Ambulatory Visit: Payer: Self-pay | Admitting: Neurology

## 2015-03-13 ENCOUNTER — Ambulatory Visit (INDEPENDENT_AMBULATORY_CARE_PROVIDER_SITE_OTHER): Payer: Medicare Other | Admitting: Neurology

## 2015-03-13 ENCOUNTER — Encounter: Payer: Self-pay | Admitting: Neurology

## 2015-03-13 VITALS — BP 153/82 | HR 65 | Resp 18 | Ht 73.0 in | Wt 282.0 lb

## 2015-03-13 DIAGNOSIS — I252 Old myocardial infarction: Secondary | ICD-10-CM

## 2015-03-13 DIAGNOSIS — E669 Obesity, unspecified: Secondary | ICD-10-CM

## 2015-03-13 DIAGNOSIS — G4733 Obstructive sleep apnea (adult) (pediatric): Secondary | ICD-10-CM | POA: Diagnosis not present

## 2015-03-13 DIAGNOSIS — Z9989 Dependence on other enabling machines and devices: Principal | ICD-10-CM

## 2015-03-13 DIAGNOSIS — Z951 Presence of aortocoronary bypass graft: Secondary | ICD-10-CM

## 2015-03-13 NOTE — Patient Instructions (Addendum)
Please continue using your BiPAP regularly. While your insurance requires that you use BiPAP at least 4 hours each night on 70% of the nights, I recommend, that you not skip any nights and use it throughout the night if you can. Getting used to BiPAP and staying with the treatment long term does take time and patience and discipline. Untreated obstructive sleep apnea when it is moderate to severe can have an adverse impact on cardiovascular health and raise her risk for heart disease, arrhythmias, hypertension, congestive heart failure, stroke and diabetes. Untreated obstructive sleep apnea causes sleep disruption, nonrestorative sleep, and sleep deprivation. This can have an impact on your day to day functioning and cause daytime sleepiness and impairment of cognitive function, memory loss, mood disturbance, and problems focussing. Using BiPAP regularly can improve these symptoms.  Keep up the good work! I will see you back in 6 months for sleep apnea check up, and if you continue to do well on BiPAP I will see you once a year thereafter.    

## 2015-03-13 NOTE — Progress Notes (Signed)
Subjective:    Patient ID: Joshua Gentry is a 69 y.o. male.  HPI     Interim history:   Mr. Joshua Gentry is a very pleasant 69 year old right-handed gentleman with an underlying medical history of obesity, coronary artery disease, s/p MI in 74, status post four-vessel CABG, and followed by Dr. Bronson Ing, hypertension, hyperlipidemia, and former smoking, who presents for follow-up consultation of his obstructive sleep apnea, now on treatment with BiPAP therapy. The patient is unaccompanied today. I first met him on 11/29/2014 at the request of his primary care physician, at which time the patient reported snoring and excessive daytime somnolence as well as witnessed apneas per wife. I asked him to return for sleep study. He had a split-night sleep study on 12/06/2014 and went over his test results with him in detail today. Baseline sleep efficiency was 62.9% with a latency to sleep of 50.5 minutes and wake after sleep onset of 32.5 minutes with mild sleep fragmentation noted. He had an elevated arousal index. He had an elevated percentage of stage II sleep, and near absence of REM sleep with a REM latency of 114.5 minutes. He had no significant PLMS, he had occasional PVCs and PACs on single-lead EKG. He had moderate to loud snoring. Total AHI was highly elevated at 75.3 per hour, baseline oxygen saturation of only 89%, nadir of 81%. He was then titrated on positive airway pressure. Sleep efficiency was 42.5%. He had a significant increase in slow-wave sleep and REM sleep post treatment. His average oxygen saturation was 92%, nadir was 89%. Snoring was eliminated, no significant PLMS were seen. He was titrated on CPAP first from a pressure of 5 cm to 11 cm. He was then switched to BiPAP for comfort and improvement of his oxygen saturations. He was further titrated from 15/11 cm to 16/11 cm. On the final pressure his AHI was 2.9 per hour. Based on the test results are prescribed BiPAP therapy for home  use.  Today, 03/13/2015: I reviewed his BiPAP compliance data from 02/10/2015 2 03/11/2015 which is a total of 30 days during which time he used his machine every night with percent used days greater than 4 hours at 97%, indicating excellent compliance with an average usage of 7 hours and 41 minutes, residual AHI low at 0.5 per hour, leak low for the 95th percentile at 0.3 L/m on a pressure of 16/11 cm with easy breathe on.  Today, 03/13/2015: He reports doing better. He feels that his sleep has improved. He feels better rested. He has occasional marks on his face from the full facemask. He was not able to tolerate the nose mask because he is a perpetual mouth breather. He is fully compliant with treatment and he has had less leg twitches at night. He feels that his restless leg symptoms are under control. He has no new complaints. He had no recent medication changes. He has a follow-up with his cardiologist for his yearly checkup next month.  Previously:  He reports snoring, excessive daytime somnolence and witnessed apneas per wife. He complains of nonrestorative sleep and sleep disruption. His sleep is not as good since his MI in 56. While he goes to sleep okay he has sleep disruption and often has a very difficult time going back to sleep once he is up in the middle of the night. He has Sx of RLS, including wiggling his feet, worse at night. He has nocturia x 1, occasional morning HAs.   His typical bedtime is reported  to be around 10 PM and wake time varies.he has daytime somnolence. His Epworth sleepiness score is 14 out of 24. His fatigue scores 43. 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 He also likes to play golf in his spare time. 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 He quit smoking when he had his heart attack. He rarely drinks alcohol and drinks caffeine in the form of sodas approximately 2 times per day. He does not endorse any  parasomnias. He has never fallen asleep at the wheel.   He has never had a sleep study. His mother was diagnosed in her later years with obstructive sleep apnea and tried CPAP but could not tolerate it. She was in her 13s at the time.   He has 3 grown children, all in the area.  He has occasional exertional shortness of breath but no chest pains.  His Past Medical History Is Significant For: Past Medical History  Diagnosis Date  . Hyperlipidemia   . Coronary artery disease   . Hypertension   . Shortness of breath   . 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 Heart attack     His Past Surgical History Is Significant For: Past Surgical History  Procedure Laterality Date  . Coronary artery bypass graft      x4  . Colonoscopy N/A 08/01/2013    Procedure: COLONOSCOPY;  Surgeon: Jamesetta So, MD;  Location: [facility];  Service: Gastroenterology;  Laterality: N/A;  . Lower leg soft tissue tumor excision      His Family History Is Significant For: Family History  Problem Relation Age of Onset  . Hypertension Mother   . Diabetes Mother   . Heart attack Father   . Diabetes Sister   . Diabetes Sister     His Social History Is Significant For: History   Social History  . Marital Status: Married    Spouse Name: N/A  . Number of Children: 3  . Years of Education: College   Occupational History  . Dance movement psychotherapist    Social History Main Topics  . Smoking status: Former Smoker -- 1.50 packs/day for 32 years    Types: Cigarettes    Quit date: 08/17/1996  . Smokeless tobacco: Not on file  . Alcohol Use: 0.0 oz/week    0 Standard drinks or equivalent per week     Comment: Occasional   . Drug Use: No  . Sexual Activity: Not on file   Other Topics Concern  . None   Social History Narrative   Married with 3 children   No regular exercise   Drinks about 48oz of coke a day     His Allergies Are:  Allergies  Allergen Reactions  . Niacin   . Penicillins   :    His Current Medications Are:  Outpatient Encounter Prescriptions as of 03/13/2015  Medication Sig  . aspirin 81 MG tablet Take 81 mg by mouth daily.  . calcium-vitamin D 250-100 MG-UNIT per tablet Take 1 tablet by mouth 2 (two) times daily.    . Coenzyme Q10 (CO Q 10) 100 MG CAPS Take 100 mg by mouth daily.    Marland Kitchen glucosamine-chondroitin 500-400 MG tablet Take 1 tablet by mouth daily.   . hydrochlorothiazide (HYDRODIURIL) 25 MG tablet TAKE 1 TABLET EVERY DAY  . losartan (COZAAR) 100 MG tablet Take 1 tablet (100 mg total) by mouth daily.  Marland Kitchen  Magnesium 400 MG CAPS Take by mouth.  . metoprolol (LOPRESSOR) 100 MG tablet TAKE 1 TABLET (100 MG TOTAL) BY MOUTH 2 (TWO) TIMES DAILY.  . Multiple Vitamins-Minerals (MULTIVITAMIN WITH MINERALS) tablet Take 1 tablet by mouth daily.    . nitroGLYCERIN (NITROSTAT) 0.4 MG SL tablet Place 1 tablet (0.4 mg total) under the tongue every 5 (five) minutes as needed.  Marland Kitchen POTASSIUM BICARBONATE PO Take by mouth.  . rosuvastatin (CRESTOR) 10 MG tablet Take 1 tablet (10 mg total) by mouth daily.  . vitamin C (ASCORBIC ACID) 500 MG tablet Take 500 mg by mouth daily.    . vitamin E 400 UNIT capsule Take 400 Units by mouth daily.     No facility-administered encounter medications on file as of 03/13/2015.  :  Review of Systems:  Out of a complete 14 point review of systems, all are reviewed and negative with the exception of these symptoms as listed below:  Review of Systems  All other systems reviewed and are negative.   Objective:  Neurologic Exam  Physical Exam Physical Examination:   Filed Vitals:   03/13/15 1327  BP: 153/82  Pulse: 65  Resp: 18   General Examination: The patient is a very pleasant 69 y.o. male in no acute distress. He appears well-developed and well-nourished and well groomed. He is in good spirits today.  HEENT: Normocephalic, atraumatic, pupils are equal, round and reactive to light and accommodation. Funduscopic exam is normal with  sharp disc margins noted. Extraocular tracking is good without limitation to gaze excursion or nystagmus noted. Normal smooth pursuit is noted. Hearing is grossly intact. Face is symmetric with normal facial animation and normal facial sensation. Speech is clear with no dysarthria noted. There is no hypophonia. There is no lip, neck/head, jaw or voice tremor. Neck is supple with full range of passive and active motion. There are no carotid bruits on auscultation. Oropharynx exam reveals: mild mouth dryness, good dental hygiene and moderate airway crowding, due to larger uvula, redundant soft palate, wider tongue. Mallampati is class II. Tongue protrudes centrally and palate elevates symmetrically. Tonsils are absent. He has a Mild overbite. Nasal inspection reveals no significant nasal mucosal bogginess  that he has a deviated septum. He said he fractured his nose in the past.   Chest: Clear to auscultation without wheezing, rhonchi or crackles noted.  Heart: S1+S2+0, regular and normal without murmurs, rubs or gallops noted.   Abdomen: Soft, non-tender and non-distended with normal bowel sounds appreciated on auscultation.  Extremities: There is no pitting edema in the distal lower extremities bilaterally. Pedal pulses are intact.  Skin: Warm and dry without trophic changes noted.  he has a irritated area on his nose. He has an appointment to be made with dermatology. There are no varicose veins. He has dry scaly patches on his scalp. He has partial baldness.  Musculoskeletal: exam reveals no obvious joint deformities, tenderness or joint swelling or erythema.   Neurologically:  Mental status: The patient is awake, alert and oriented in all 4 spheres. His immediate and remote memory, attention, language skills and fund of knowledge are appropriate. There is no evidence of aphasia, agnosia, apraxia or anomia. Speech is clear with normal prosody and enunciation. Thought process is linear. Mood is  normal and affect is normal.  Cranial nerves II - XII are as described above under HEENT exam. In addition: shoulder shrug is normal with equal shoulder height noted. Motor exam: Normal bulk, strength and tone is noted. There  is no drift, tremor or rebound. Romberg is negative. Reflexes are 2+ throughout. Fine motor skills and coordination: intact with normal finger taps, normal hand movements, normal rapid alternating patting, normal foot taps and normal foot agility.  Cerebellar testing: No dysmetria or intention tremor on finger to nose testing. Heel to shin is unremarkable bilaterally. There is no truncal or gait ataxia.  Sensory exam: intact to light touch, pinprick, vibration, temperature sense in the upper and lower extremities.  Gait, station and balance: He stands easily. No veering to one side is noted. No leaning to one side is noted. Posture is age-appropriate and stance is narrow based. Gait shows normal stride length and normal pace. No problems turning are noted. He turns en bloc. Tandem walk is slightly difficult for him, unchanged.   Assessment and Plan:   In summary, LUISFERNANDO BRIGHTWELL is a very pleasant 69 year old male with an underlying medical history of obesity, coronary artery disease, s/p MI in 76, status post four-vessel CABG, and followed by Dr. Bronson Ing, hypertension, hyperlipidemia, and former smoking, who presents for follow-up consultation of his severe obstructive sleep apnea, well treated with BiPAP at 16/11 cm via full facemask. We talked about his split-night sleep study results from April in detail today. Also went over his compliance data with him. He has been fully compliant. He is congratulated on his treatment adherence. He also feels improved in his sleep consolidation, sleep quality and daytime sleepiness. His physical exam is stable with the exception of mild weight loss. He is encouraged to try to pursue weight loss and healthy lifestyle. He is encouraged to  continue with the BiPAP at the current setting and advised to continue to be fully compliant with treatment. I answered all his questions today and he was in agreement. Since he is doing well I suggested a 6 month follow-up, sooner if needed.   I again explained in particular the risks and ramifications of untreated moderate to severe OSA, especially with respect to developing cardiovascular disease down the Road, including congestive heart failure, difficult to treat hypertension, cardiac arrhythmias, or stroke. Even type 2 diabetes has, in part, been linked to untreated OSA. Symptoms of untreated OSA include daytime sleepiness, memory problems, mood irritability and mood disorder such as depression and anxiety, lack of energy, as well as recurrent headaches, especially morning headaches. We talked about trying to maintain a healthy lifestyle in general, as well as the importance of weight control. I encouraged the patient to eat healthy, exercise daily and keep well hydrated, to keep a scheduled bedtime and wake time routine, to not skip any meals and eat healthy snacks in between meals. I advised the patient not to drive when feeling sleepy. I explained the importance of being compliant with PAP treatment, not only for insurance purposes but primarily to improve His symptoms, and for the patient's long term health benefit, including to reduce His cardiovascular risks. I spent 20 minutes in total face-to-face time with the patient, more than 50% of which was spent in counseling and coordination of care, reviewing test results, reviewing medication and discussing or reviewing the diagnosis of OSA, its prognosis and treatment options.

## 2015-04-04 ENCOUNTER — Encounter: Payer: Medicare Other | Admitting: Adult Health

## 2015-04-04 NOTE — Progress Notes (Signed)
Cardiology Office Note   ERROR Cancelled Appointment

## 2015-04-09 ENCOUNTER — Ambulatory Visit (INDEPENDENT_AMBULATORY_CARE_PROVIDER_SITE_OTHER): Payer: Medicare Other | Admitting: Adult Health

## 2015-04-09 ENCOUNTER — Encounter: Payer: Self-pay | Admitting: Adult Health

## 2015-04-09 VITALS — BP 152/82 | HR 59 | Ht 73.0 in | Wt 281.8 lb

## 2015-04-09 DIAGNOSIS — G4733 Obstructive sleep apnea (adult) (pediatric): Secondary | ICD-10-CM

## 2015-04-09 DIAGNOSIS — Z9989 Dependence on other enabling machines and devices: Secondary | ICD-10-CM

## 2015-04-09 DIAGNOSIS — I10 Essential (primary) hypertension: Secondary | ICD-10-CM

## 2015-04-09 DIAGNOSIS — E785 Hyperlipidemia, unspecified: Secondary | ICD-10-CM

## 2015-04-09 DIAGNOSIS — I2510 Atherosclerotic heart disease of native coronary artery without angina pectoris: Secondary | ICD-10-CM

## 2015-04-09 MED ORDER — HYDROCHLOROTHIAZIDE 25 MG PO TABS: ORAL_TABLET | ORAL | Status: DC

## 2015-04-09 MED ORDER — LOSARTAN POTASSIUM 100 MG PO TABS: 100.0000 mg | ORAL_TABLET | Freq: Every day | ORAL | Status: DC

## 2015-04-09 MED ORDER — MAGNESIUM 400 MG PO CAPS
400.0000 mg | ORAL_CAPSULE | Freq: Every day | ORAL | Status: DC
Start: 2015-04-09 — End: 2023-03-17

## 2015-04-09 MED ORDER — METOPROLOL TARTRATE 100 MG PO TABS: ORAL_TABLET | ORAL | Status: DC

## 2015-04-09 MED ORDER — ROSUVASTATIN CALCIUM 10 MG PO TABS: 10.0000 mg | ORAL_TABLET | Freq: Every day | ORAL | Status: DC

## 2015-04-09 MED ORDER — NITROGLYCERIN 0.4 MG SL SUBL: 0.4000 mg | SUBLINGUAL_TABLET | SUBLINGUAL | Status: DC | PRN

## 2015-04-09 NOTE — Progress Notes (Signed)
Name: Joshua Gentry    DOB: 1946-08-17  Age: 69 y.o.  MR#: [id number]       PCP:  Purvis Kilts, MD      Insurance: Payor: MEDICARE / Plan: MEDICARE PART A AND B / Product Type: *No Product type* /   CC:   No chief complaint on file.   VS Filed Vitals:   04/09/15 1453  BP: 152/82  Pulse: 59  Height: 6\' 1"  (1.854 m)  Weight: 281 lb 12.8 oz (127.824 kg)  SpO2: 97%    Weights Current Weight  04/09/15 281 lb 12.8 oz (127.824 kg)  03/13/15 282 lb (127.914 kg)  12/07/14 286 lb (129.729 kg)    Blood Pressure  BP Readings from Last 3 Encounters:  04/09/15 152/82  03/13/15 153/82  12/07/14 172/89     Admit date:  (Not on file) Last encounter with RMR:  Visit date not found   Allergy Niacin and Penicillins  Current Outpatient Prescriptions  Medication Sig Dispense Refill  . aspirin 81 MG tablet Take 81 mg by mouth daily.    . calcium-vitamin D 250-100 MG-UNIT per tablet Take 1 tablet by mouth 2 (two) times daily.      . Coenzyme Q10 (CO Q 10) 100 MG CAPS Take 100 mg by mouth daily.      Marland Kitchen glucosamine-chondroitin 500-400 MG tablet Take 1 tablet by mouth daily.     . hydrochlorothiazide (HYDRODIURIL) 25 MG tablet TAKE 1 TABLET EVERY DAY 90 tablet 3  . Magnesium 400 MG CAPS Take by mouth.    . metoprolol (LOPRESSOR) 100 MG tablet TAKE 1 TABLET (100 MG TOTAL) BY MOUTH 2 (TWO) TIMES DAILY. 180 tablet 3  . Multiple Vitamins-Minerals (MULTIVITAMIN WITH MINERALS) tablet Take 1 tablet by mouth daily.      . nitroGLYCERIN (NITROSTAT) 0.4 MG SL tablet Place 1 tablet (0.4 mg total) under the tongue every 5 (five) minutes as needed. 25 tablet 3  . POTASSIUM BICARBONATE PO Take by mouth.    . vitamin C (ASCORBIC ACID) 500 MG tablet Take 500 mg by mouth daily.      . vitamin E 400 UNIT capsule Take 400 Units by mouth daily.      Marland Kitchen losartan (COZAAR) 100 MG tablet Take 1 tablet (100 mg total) by mouth daily. 90 tablet 3  . rosuvastatin (CRESTOR) 10 MG tablet Take 1 tablet (10 mg total)  by mouth daily. 90 tablet 3   No current facility-administered medications for this visit.    Discontinued Meds:   There are no discontinued medications.  Patient Active Problem List   Diagnosis Date Noted  . RBBB plus LA hemiblock 03/21/2013  . OVERWEIGHT 07/22/2009  . HYPERLIPIDEMIA 01/09/2009  . HYPERTENSION 01/09/2009  . CAD 01/09/2009    LABS    Component Value Date/Time   NA 141 03/22/2013 0955   K 3.9 03/22/2013 0955   CL 103 03/22/2013 0955   CO2 30 03/22/2013 0955   GLUCOSE 108* 03/22/2013 0955   BUN 14 03/22/2013 0955   CREATININE 0.89 03/22/2013 0955   CALCIUM 9.1 03/22/2013 0955   CMP     Component Value Date/Time   NA 141 03/22/2013 0955   K 3.9 03/22/2013 0955   CL 103 03/22/2013 0955   CO2 30 03/22/2013 0955   GLUCOSE 108* 03/22/2013 0955   BUN 14 03/22/2013 0955   CREATININE 0.89 03/22/2013 0955   CALCIUM 9.1 03/22/2013 0955   PROT 6.3 03/30/2014 0730   ALBUMIN 3.9  03/30/2014 0730   AST 21 03/30/2014 0730   ALT 25 03/30/2014 0730   ALKPHOS 72 03/30/2014 0730   BILITOT 1.4* 03/30/2014 0730    No results found for: WBC, HGB, HCT, MCV  Lipid Panel     Component Value Date/Time   CHOL 139 03/30/2014 0730   TRIG 167* 03/30/2014 0730   HDL 34* 03/30/2014 0730   CHOLHDL 4.1 03/30/2014 0730   VLDL 33 03/30/2014 0730   LDLCALC 72 03/30/2014 0730    ABG No results found for: PHART, PCO2ART, PO2ART, HCO3, TCO2, ACIDBASEDEF, O2SAT   No results found for: TSH BNP (last 3 results) No results for input(s): BNP in the last 8760 hours.  ProBNP (last 3 results) No results for input(s): PROBNP in the last 8760 hours.  Cardiac Panel (last 3 results) No results for input(s): CKTOTAL, CKMB, TROPONINI, RELINDX in the last 72 hours.  Iron/TIBC/Ferritin/ %Sat No results found for: IRON, TIBC, FERRITIN, IRONPCTSAT   EKG Orders placed or performed in visit on 03/28/14  . EKG 12-Lead     Prior Assessment and Plan Problem List as of 04/09/2015       Cardiovascular and Mediastinum   HYPERTENSION   Last Assessment & Plan 03/21/2013 Office Visit Written 03/21/2013  1:07 PM by Josue Hector, MD    Well controlled.  Continue current medications and low sodium Dash type diet.         CAD   Last Assessment & Plan 03/21/2013 Office Visit Written 03/21/2013  1:08 PM by Josue Hector, MD    Active with no chest pain Stable      RBBB plus LA hemiblock   Last Assessment & Plan 03/21/2013 Office Visit Written 03/21/2013  1:09 PM by Josue Hector, MD    Despite high dose lopressor he feels his HR is never too low ECG stable with no dizzyness or syncope Yearly ECG        Other   HYPERLIPIDEMIA   Last Assessment & Plan 03/21/2013 Office Visit Written 03/21/2013  1:07 PM by Josue Hector, MD    Cholesterol is at goal.  Continue current dose of statin and diet Rx.  No myalgias or side effects.  F/U  LFT's in 6 months. Lab Results  Component Value Date   LDLCALC 117* 03/24/2012  Needs lab work but not fasting today will order                 OVERWEIGHT   Last Assessment & Plan 03/21/2013 Office Visit Written 03/21/2013  1:08 PM by Josue Hector, MD    Discussed exercise program and low carb diet Wife has food allergies and does not have a lot of "junk" around house          Imaging: No results found.

## 2015-04-09 NOTE — Patient Instructions (Signed)
Your physician wants you to follow-up in: 6 months. You will receive a reminder letter in the mail two months in advance. If you don't receive a letter, please call our office to schedule the follow-up appointment.  Your physician recommends that you continue on your current medications as directed. Please refer to the Current Medication list given to you today.   Your physician recommends that you return for lab work [id number] (CBC, CMET, Lipid)  Thank you for choosing [facility]!

## 2015-04-09 NOTE — Progress Notes (Signed)
Cardiology Office Note   Date:  04/09/2015   ID:  Gentry, Joshua Sep 26, 1945, MRN [id number]  PCP:  Purvis Kilts, MD  Cardiologist:  Woodroe Chen, NP   No chief complaint on file.     History of Present Illness: Joshua Gentry is a 69 y.o. male who presents for ongoing assessment and management of CAD, with CABG in 1997, hypertension, and hyperlipidemia with chronic RBBB and LAFB. He was continued on BB, ASA, statin and ARB with HCTZ. He is here for annual follow up.   He is without complaints today. He has recently been diagnosed with OSA in March of 2016, and is now on CPAP which is complaint with. He has had both eyes with lens replacement in April of 2016. He remains active babysitting his one year old granddaughter 3 days a week and still works at the golf pro-shop. He is medically complaint. He denies complaints of dyspnea, chest pain or fatigue.    Past Medical History  Diagnosis Date  . Hyperlipidemia   . Coronary artery disease   . Hypertension   . Shortness of breath   . 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 Heart attack   . OSA on CPAP March 2016    Compliant  . Obesity (BMI 30-39.9)     Past Surgical History  Procedure Laterality Date  . Coronary artery bypass graft      x4  . Colonoscopy N/A 08/01/2013    Procedure: COLONOSCOPY;  Surgeon: Jamesetta So, MD;  [facility];  Service: Gastroenterology;  Laterality: N/A;  . Lower leg soft tissue tumor excision    . Lens Bilateral April 2016    Replacment      Current Outpatient Prescriptions  Medication Sig Dispense Refill  . aspirin 81 MG tablet Take 81 mg by mouth daily.    . calcium-vitamin D 250-100 MG-UNIT per tablet Take 1 tablet by mouth 2 (two) times daily.      . Coenzyme Q10 (CO Q 10) 100 MG CAPS Take 100 mg by mouth daily.      Marland Kitchen glucosamine-chondroitin 500-400 MG tablet Take 1 tablet by mouth daily.     . hydrochlorothiazide (HYDRODIURIL) 25 MG tablet  TAKE 1 TABLET EVERY DAY 90 tablet 3  . Magnesium 400 MG CAPS Take by mouth.    . metoprolol (LOPRESSOR) 100 MG tablet TAKE 1 TABLET (100 MG TOTAL) BY MOUTH 2 (TWO) TIMES DAILY. 180 tablet 3  . Multiple Vitamins-Minerals (MULTIVITAMIN WITH MINERALS) tablet Take 1 tablet by mouth daily.      . nitroGLYCERIN (NITROSTAT) 0.4 MG SL tablet Place 1 tablet (0.4 mg total) under the tongue every 5 (five) minutes as needed. 25 tablet 3  . POTASSIUM BICARBONATE PO Take by mouth.    . vitamin C (ASCORBIC ACID) 500 MG tablet Take 500 mg by mouth daily.      . vitamin E 400 UNIT capsule Take 400 Units by mouth daily.      Marland Kitchen losartan (COZAAR) 100 MG tablet Take 1 tablet (100 mg total) by mouth daily. 90 tablet 3  . rosuvastatin (CRESTOR) 10 MG tablet Take 1 tablet (10 mg total) by mouth daily. 90 tablet 3   No current facility-administered medications for this visit.    Allergies:   Niacin and Penicillins    Social History:  The patient  reports that he quit smoking about 18 years ago. His smoking use included Cigarettes. He has  a 48 pack-year smoking history. He does not have any smokeless tobacco history on file. He reports that he drinks alcohol. He reports that he does not use illicit drugs.   Family History:  The patient's family history includes Diabetes in his mother, sister, and sister; Heart attack in his father; Hypertension in his mother.    ROS: .   All other systems are reviewed and negative.Unless otherwise mentioned in H&P above.   PHYSICAL EXAM: VS:  BP 152/82 mmHg  Pulse 59  Ht 6\' 1"  (1.854 m)  Wt 281 lb 12.8 oz (127.824 kg)  BMI 37.19 kg/m2  SpO2 97% , BMI Body mass index is 37.19 kg/(m^2). GEN: Well nourished, well developed, in no acute distress HEENT: normal Neck: no JVD, carotid bruits, or masses Cardiac: RRR;distant, no murmurs, rubs, or gallops,no edema  Respiratory:  Clear to auscultation bilaterally, normal work of breathing GI: soft, nontender, nondistended, + BS,  Obese. MS: no deformity or atrophy Skin: warm and dry, no rash Neuro:  Strength and sensation are intact Psych: euthymic mood, full affect   EKG:  EKG ordered today. The ekg ordered today demonstrates Sinus bradycardia rate of 54 bpm, with RBBB and LAFB. This is unchanged from prior EKG.    Recent Labs: No results found for requested labs within last 365 days.    Lipid Panel    Component Value Date/Time   CHOL 139 03/30/2014 0730   TRIG 167* 03/30/2014 0730   HDL 34* 03/30/2014 0730   CHOLHDL 4.1 03/30/2014 0730   VLDL 33 03/30/2014 0730   LDLCALC 72 03/30/2014 0730      Wt Readings from Last 3 Encounters:  04/09/15 281 lb 12.8 oz (127.824 kg)  03/13/15 282 lb (127.914 kg)  12/07/14 286 lb (129.729 kg)      Other studies Reviewed: Additional studies/ records that were reviewed today include: None. Review of the above records demonstrates: None   ASSESSMENT AND PLAN:  1.  CAD: Hx of CABG (1997): He will continue ASA, metoprolol, ARB, NTG and statin. I will have annual labs completed to include CMET, CBC, Lipids. EKG today was unchanged from prior EKG one year ago. He is asymptomatic. Will not plan on stress test or other testing at this time.   2. Hypertension: Not optimal for patient with CAD. It is better than on last visit, which may have been improved with use of CPAP. I have counseled him on wt loss and increased activity. He is also counseled on low sodium diet. I will not make any changes at this time, but see him in 6 months instead of annually to ascertain if BP remains elevated. We may need to increase medications or add to his regimen. He is on HCTZ, CMET is ordered to evaluate kidney fx.   3. Obesity: He is counseled on wt loss and increased exercise with reduced caloric intake. I have discussed taking his granddaughter out for walks etc as he will need an excuse to walk and be more active. He states he will try doing that more. If he does not lose wt, will  consider adding amlodipine to his regimen.   4. Hyperlipidemia: He is counseled on low cholesterol diet. Checking lipids.    Current medicines are reviewed at length with the patient today.  Refills provided.   Labs/ tests ordered today include: Lipids, CMET, CBC.   Orders Placed This Encounter  Procedures  . EKG 12-Lead     Disposition:   FU with 6 months.  Signed, Jory Sims, NP  04/09/2015 3:22 PM    [facility] [address] [place], [place] [address] Phone: [phone]; Fax: [phone]

## 2015-04-10 ENCOUNTER — Other Ambulatory Visit: Payer: Self-pay | Admitting: Adult Health

## 2015-04-10 DIAGNOSIS — I10 Essential (primary) hypertension: Secondary | ICD-10-CM | POA: Diagnosis not present

## 2015-04-10 DIAGNOSIS — E785 Hyperlipidemia, unspecified: Secondary | ICD-10-CM | POA: Diagnosis not present

## 2015-04-10 LAB — CBC WITH DIFFERENTIAL/PLATELET
BASOS ABS: 0.1 10*3/uL (ref 0.0–0.1)
BASOS PCT: 1 % (ref 0–1)
EOS PCT: 3 % (ref 0–5)
Eosinophils Absolute: 0.2 10*3/uL (ref 0.0–0.7)
HCT: 46.9 % (ref 39.0–52.0)
Hemoglobin: 15.6 g/dL (ref 13.0–17.0)
LYMPHS PCT: 32 % (ref 12–46)
Lymphs Abs: 2.6 10*3/uL (ref 0.7–4.0)
MCH: 27.8 pg (ref 26.0–34.0)
MCHC: 33.3 g/dL (ref 30.0–36.0)
MCV: 83.5 fL (ref 78.0–100.0)
MONO ABS: 0.5 10*3/uL (ref 0.1–1.0)
MPV: 9.4 fL (ref 8.6–12.4)
Monocytes Relative: 6 % (ref 3–12)
NEUTROS ABS: 4.8 10*3/uL (ref 1.7–7.7)
Neutrophils Relative %: 58 % (ref 43–77)
Platelets: 196 10*3/uL (ref 150–400)
RBC: 5.62 MIL/uL (ref 4.22–5.81)
RDW: 14.2 % (ref 11.5–15.5)
WBC: 8.2 10*3/uL (ref 4.0–10.5)

## 2015-04-10 LAB — LIPID PANEL
Cholesterol: 119 mg/dL — ABNORMAL LOW (ref 125–200)
HDL: 35 mg/dL — AB (ref >=40)
LDL CALC: 68 mg/dL (ref <130)
Total CHOL/HDL Ratio: 3.4 Ratio (ref <=5.0)
Triglycerides: 80 mg/dL (ref <150)
VLDL: 16 mg/dL (ref <30)

## 2015-04-10 LAB — COMPREHENSIVE METABOLIC PANEL
ALK PHOS: 80 U/L (ref 40–115)
ALT: 18 U/L (ref 9–46)
AST: 18 U/L (ref 10–35)
Albumin: 4 g/dL (ref 3.6–5.1)
BILIRUBIN TOTAL: 1 mg/dL (ref 0.2–1.2)
BUN: 18 mg/dL (ref 7–25)
CO2: 29 mmol/L (ref 20–31)
CREATININE: 0.84 mg/dL (ref 0.70–1.25)
Calcium: 9 mg/dL (ref 8.6–10.3)
Chloride: 106 mmol/L (ref 98–110)
GLUCOSE: 103 mg/dL — AB (ref 65–99)
Potassium: 3.7 mmol/L (ref 3.5–5.3)
SODIUM: 142 mmol/L (ref 135–146)
Total Protein: 6.2 g/dL (ref 6.1–8.1)

## 2015-04-29 ENCOUNTER — Telehealth: Payer: Self-pay

## 2015-04-29 NOTE — Telephone Encounter (Signed)
Left message for patient to call back and make an appt with one of our nurse practitioners. Patient's insurance requires [place] to see the patient after 03/15/15 to discuss CPAP compliance. He saw [place] on 03/13/15 which does not satisfy requirement. I asked for patient to call us back and make appt with one of our NPs to satisfy insurance requirements.

## 2015-04-29 NOTE — Telephone Encounter (Signed)
-----   Message from Gilda Crease sent at 04/29/2015  4:51 PM EDT ----- Unfortunately, patient will have to come in again.  Sorry!  ----- Message -----    From: Laurence Spates, RN    Sent: 04/29/2015   3:13 PM      To: Gilda Crease  We received a notice for getting face to face notes on this patient. The notice states that the follow up needs to be after 7/29. We saw him on 7/27. Do you think that will be ok? Or do we need to bring him back in?  Leighton Parody RN

## 2015-05-09 ENCOUNTER — Telehealth: Payer: Self-pay | Admitting: Cardiovascular Disease

## 2015-05-09 NOTE — Telephone Encounter (Signed)
Needs refill on Crestor sent to [facility] / tg

## 2015-05-09 NOTE — Telephone Encounter (Signed)
Pt informed he already had refill for 1 yr e-scribed in august

## 2015-05-21 ENCOUNTER — Encounter: Payer: Self-pay | Admitting: Adult Health

## 2015-05-21 ENCOUNTER — Ambulatory Visit (INDEPENDENT_AMBULATORY_CARE_PROVIDER_SITE_OTHER): Payer: Medicare Other | Admitting: Adult Health

## 2015-05-21 VITALS — BP 145/74 | HR 54 | Ht 73.0 in | Wt 282.0 lb

## 2015-05-21 DIAGNOSIS — G4733 Obstructive sleep apnea (adult) (pediatric): Secondary | ICD-10-CM

## 2015-05-21 DIAGNOSIS — I2510 Atherosclerotic heart disease of native coronary artery without angina pectoris: Secondary | ICD-10-CM

## 2015-05-21 NOTE — Progress Notes (Addendum)
PATIENT: Joshua Gentry DOB: 1946-02-21  REASON FOR VISIT: follow up HISTORY FROM: patient  HISTORY OF PRESENT ILLNESS: Joshua Gentry is an 69 year old male with a history of obstructive sleep apnea on BiPAP He returns today for a compliance download for insurance purposes. His download indicates that he uses machine 30 out of 30 days for compliance of 100%. He uses machine greater than 4 hours each night. On average he uses his machine 8 hours and 33 minutes. His current setting is his inspiratory pressure 16 cmH20 and expiratory pressure of 11 cmH20 with easy breathe own. His residual AHI is 0.4- patient has a leak in the 95th percentile at 21.6 L/m. Overall the patient is doing well. He feels that the BiPAP has been beneficial. His fatigue severity score is 22 an Epworth sleepiness scale is 5. The patient states that he feels more restful after using the BiPAP. He returns today for an evaluation.  HISTORY 03/13/15: Joshua Gentry is a very pleasant 69 year old right-handed gentleman with an underlying medical history of obesity, coronary artery disease, s/p MI in 82, status post four-vessel CABG, and followed by Dr. Bronson Ing, hypertension, hyperlipidemia, and former smoking, who presents for follow-up consultation of his obstructive sleep apnea, now on treatment with BiPAP therapy. The patient is unaccompanied today. I first met him on 11/29/2014 at the request of his primary care physician, at which time the patient reported snoring and excessive daytime somnolence as well as witnessed apneas per wife. I asked him to return for sleep study. He had a split-night sleep study on 12/06/2014 and went over his test results with him in detail today. Baseline sleep efficiency was 62.9% with a latency to sleep of 50.5 minutes and wake after sleep onset of 32.5 minutes with mild sleep fragmentation noted. He had an elevated arousal index. He had an elevated percentage of stage II sleep, and near absence of REM  sleep with a REM latency of 114.5 minutes. He had no significant PLMS, he had occasional PVCs and PACs on single-lead EKG. He had moderate to loud snoring. Total AHI was highly elevated at 75.3 per hour, baseline oxygen saturation of only 89%, nadir of 81%. He was then titrated on positive airway pressure. Sleep efficiency was 42.5%. He had a significant increase in slow-wave sleep and REM sleep post treatment. His average oxygen saturation was 92%, nadir was 89%. Snoring was eliminated, no significant PLMS were seen. He was titrated on CPAP first from a pressure of 5 cm to 11 cm. He was then switched to BiPAP for comfort and improvement of his oxygen saturations. He was further titrated from 15/11 cm to 16/11 cm. On the final pressure his AHI was 2.9 per hour. Based on the test results are prescribed BiPAP therapy for home use.  Today, 03/13/2015: I reviewed his BiPAP compliance data from 02/10/2015 2 03/11/2015 which is a total of 30 days during which time he used his machine every night with percent used days greater than 4 hours at 97%, indicating excellent compliance with an average usage of 7 hours and 41 minutes, residual AHI low at 0.5 per hour, leak low for the 95th percentile at 0.3 L/m on a pressure of 16/11 cm with easy breathe on.  Today, 03/13/2015: He reports doing better. He feels that his sleep has improved. He feels better rested. He has occasional marks on his face from the full facemask. He was not able to tolerate the nose mask because he is a perpetual mouth  breather. He is fully compliant with treatment and he has had less leg twitches at night. He feels that his restless leg symptoms are under control. He has no new complaints. He had no recent medication changes. He has a follow-up with his cardiologist for his yearly checkup next month.   REVIEW OF SYSTEMS: Out of a complete 14 system review of symptoms, the patient complains only of the following symptoms, and all other reviewed  systems are negative.  ALLERGIES: Allergies  Allergen Reactions  . Niacin   . Penicillins     HOME MEDICATIONS: Outpatient Prescriptions Prior to Visit  Medication Sig Dispense Refill  . aspirin 81 MG tablet Take 81 mg by mouth daily.    . calcium-vitamin D 250-100 MG-UNIT per tablet Take 1 tablet by mouth 2 (two) times daily.      . Coenzyme Q10 (CO Q 10) 100 MG CAPS Take 100 mg by mouth daily.      Marland Kitchen glucosamine-chondroitin 500-400 MG tablet Take 1 tablet by mouth daily.     . hydrochlorothiazide (HYDRODIURIL) 25 MG tablet TAKE 1 TABLET EVERY DAY 90 tablet 3  . losartan (COZAAR) 100 MG tablet Take 1 tablet (100 mg total) by mouth daily. 90 tablet 3  . Magnesium 400 MG CAPS Take 400 mg by mouth daily. 90 capsule 3  . metoprolol (LOPRESSOR) 100 MG tablet TAKE 1 TABLET (100 MG TOTAL) BY MOUTH 2 (TWO) TIMES DAILY. 180 tablet 3  . Multiple Vitamins-Minerals (MULTIVITAMIN WITH MINERALS) tablet Take 1 tablet by mouth daily.      . nitroGLYCERIN (NITROSTAT) 0.4 MG SL tablet Place 1 tablet (0.4 mg total) under the tongue every 5 (five) minutes as needed. 25 tablet 3  . POTASSIUM BICARBONATE PO Take by mouth.    . rosuvastatin (CRESTOR) 10 MG tablet Take 1 tablet (10 mg total) by mouth daily. 90 tablet 3  . vitamin C (ASCORBIC ACID) 500 MG tablet Take 500 mg by mouth daily.      . vitamin E 400 UNIT capsule Take 400 Units by mouth daily.       No facility-administered medications prior to visit.    PAST MEDICAL HISTORY: Past Medical History  Diagnosis Date  . Hyperlipidemia   . Coronary artery disease   . Hypertension   . Shortness of breath   . Cancer [facility]) 2006    Leg (Right) Sarcoma  . Heart attack ([place])   . OSA on CPAP March 2016    Compliant  . Obesity (BMI 30-39.9)     PAST SURGICAL HISTORY: Past Surgical History  Procedure Laterality Date  . Coronary artery bypass graft      x4  . Colonoscopy N/A 08/01/2013    Procedure: COLONOSCOPY;  Surgeon: Jamesetta So, MD;   [facility];  Service: Gastroenterology;  Laterality: N/A;  . Lower leg soft tissue tumor excision    . Lens Bilateral April 2016    Replacment     FAMILY HISTORY: Family History  Problem Relation Age of Onset  . Hypertension Mother   . Diabetes Mother   . Heart attack Father   . Diabetes Sister   . Diabetes Sister     SOCIAL HISTORY: Social History   Social History  . Marital Status: Married    Spouse Name: N/A  . Number of Children: 3  . Years of Education: College   Occupational History  . Dance movement psychotherapist    Social History Main Topics  . Smoking status: Former  Smoker -- 1.50 packs/day for 32 years    Types: Cigarettes    Quit date: 08/17/1996  . Smokeless tobacco: Not on file  . Alcohol Use: 0.0 oz/week    0 Standard drinks or equivalent per week     Comment: Occasional   . Drug Use: No  . Sexual Activity: Not on file   Other Topics Concern  . Not on file   Social History Narrative   Married with 3 children   No regular exercise   Drinks about 48oz of coke a day       PHYSICAL EXAM  Filed Vitals:   05/21/15 0924  BP: 145/74  Pulse: 54  Height: _0  (1.854 m)  Weight: 282 lb (127.914 kg)   Body mass index is 37.21 kg/(m^2).  Generalized: Well developed, in no acute distress  Neck: Circumference 19 inches, Mallampati 2+  Neurological examination  Mentation: Alert oriented to time, place, history taking. Follows all commands speech and language fluent Cranial nerve II-XII: Pupils were equal round reactive to light. Extraocular movements were full, visual field were full on confrontational test. Facial sensation and strength were normal. Uvula tongue midline. Head turning and shoulder shrug  were normal and symmetric. Motor: The motor testing reveals 5 over 5 strength of all 4 extremities. Good symmetric motor tone is noted throughout.  Sensory: Sensory testing is intact to soft touch on all 4 extremities. No evidence of extinction  is noted.  Coordination: Cerebellar testing reveals good finger-nose-finger and heel-to-shin bilaterally.  Gait and station: Gait is normal.  Reflexes: Deep tendon reflexes are symmetric and normal bilaterally.   DIAGNOSTIC DATA (LABS, IMAGING, TESTING) - I reviewed patient records, labs, notes, testing and imaging myself where available.  Lab Results  Component Value Date   WBC 8.2 04/10/2015   HGB 15.6 04/10/2015   HCT 46.9 04/10/2015   MCV 83.5 04/10/2015   PLT 196 04/10/2015      Component Value Date/Time   NA 142 04/10/2015 0744   K 3.7 04/10/2015 0744   CL 106 04/10/2015 0744   CO2 29 04/10/2015 0744   GLUCOSE 103* 04/10/2015 0744   BUN 18 04/10/2015 0744   CREATININE 0.84 04/10/2015 0744   CALCIUM 9.0 04/10/2015 0744   PROT 6.2 04/10/2015 0744   ALBUMIN 4.0 04/10/2015 0744   AST 18 04/10/2015 0744   ALT 18 04/10/2015 0744   ALKPHOS 80 04/10/2015 0744   BILITOT 1.0 04/10/2015 0744   Lab Results  Component Value Date   CHOL 119* 04/10/2015   HDL 35* 04/10/2015   LDLCALC 68 04/10/2015   TRIG 80 04/10/2015   CHOLHDL 3.4 04/10/2015    ASSESSMENT AND PLAN 69 y.o. year old male  has a past medical history of Hyperlipidemia; Coronary artery disease; Hypertension; Shortness of breath; Cancer ([place]) (2006); Heart attack ([place]); OSA on CPAP (March 2016); and Obesity (BMI 30-39.9). here with:  1. Obstructive sleep apnea on BiPAP  The patient's compliance download is excellent. His apnea continues to be well treated. Patient is encouraged to continue using his BiPAP nightly. Patient advised that if his symptoms worsen or he develops any new symptoms he should let us know. He will follow-up in January with Dr. Rexene Alberts.   Ward Givens, MSN, NP-C 05/21/2015, 9:46 AM [facility] [address], [place], [place] [address] [phone]   I reviewed the above note and documentation by the Nurse Practitioner and agree with the history, physical exam,  assessment and plan as  outlined above. I was immediately available for face-to-face consultation. Star Age, MD, PhD [facility])

## 2015-05-21 NOTE — Patient Instructions (Signed)
Overall you are doing well Continue using your machine nightly If your symptoms worsen or you develop new symptoms please let us know.

## 2015-06-04 DIAGNOSIS — C499 Malignant neoplasm of connective and soft tissue, unspecified: Secondary | ICD-10-CM | POA: Diagnosis not present

## 2015-06-04 DIAGNOSIS — C4921 Malignant neoplasm of connective and soft tissue of right lower limb, including hip: Secondary | ICD-10-CM | POA: Diagnosis not present

## 2015-06-04 DIAGNOSIS — Z9889 Other specified postprocedural states: Secondary | ICD-10-CM | POA: Diagnosis not present

## 2015-06-29 IMAGING — US US ABDOMEN LIMITED
1 series · 8 of 8 positions shown · non-contrast
Comparison: None.

CLINICAL DATA: History of palpable abdominal mass over right flank.

EXAM:
US ABDOMEN LIMITED - RIGHT UPPER QUADRANT

[Series 1: us abdomen limited · 0.06mm/px · 8 acquisitions, 8 frames shown]
[im 1/8]
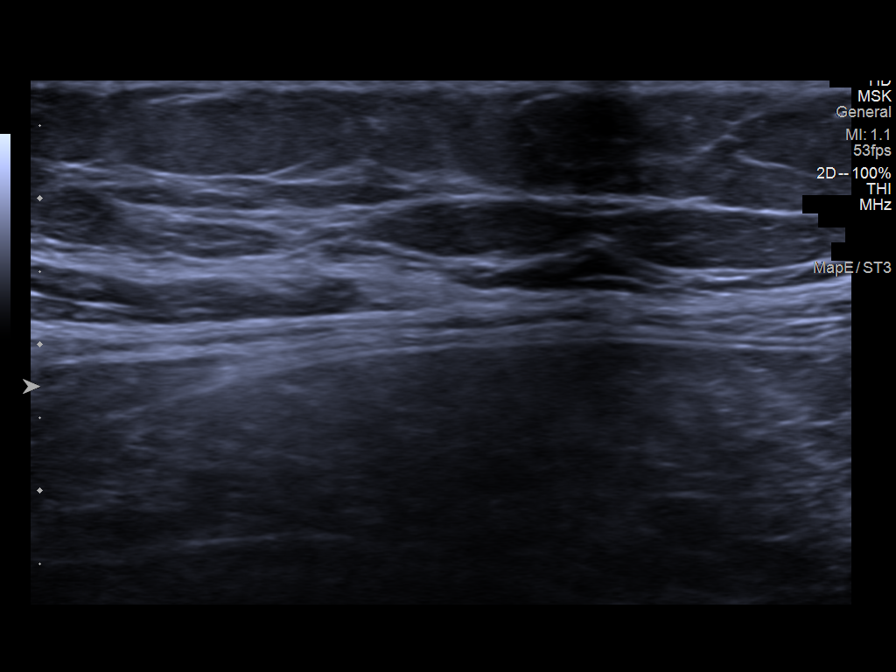
[im 2/8]
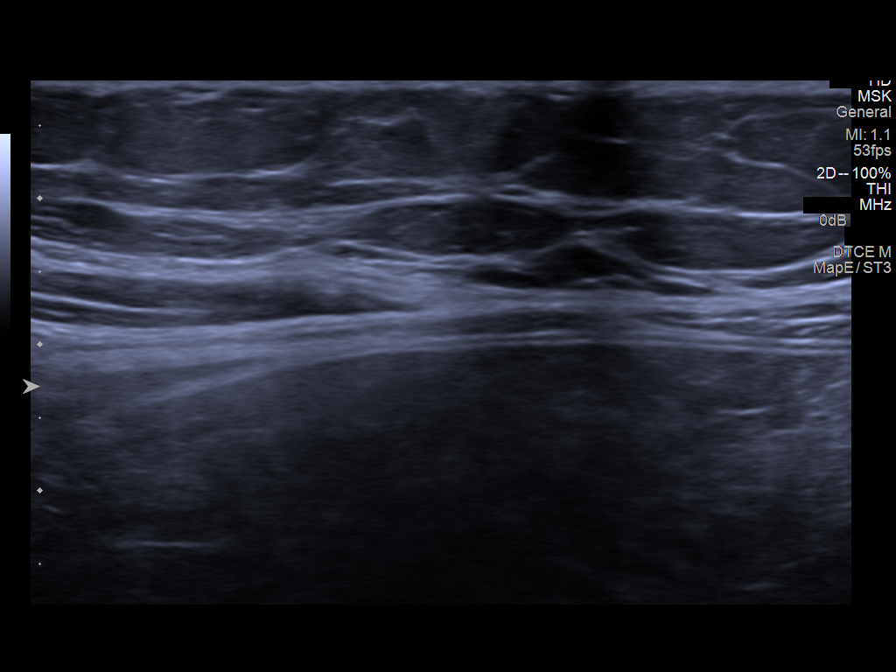
[im 3/8]
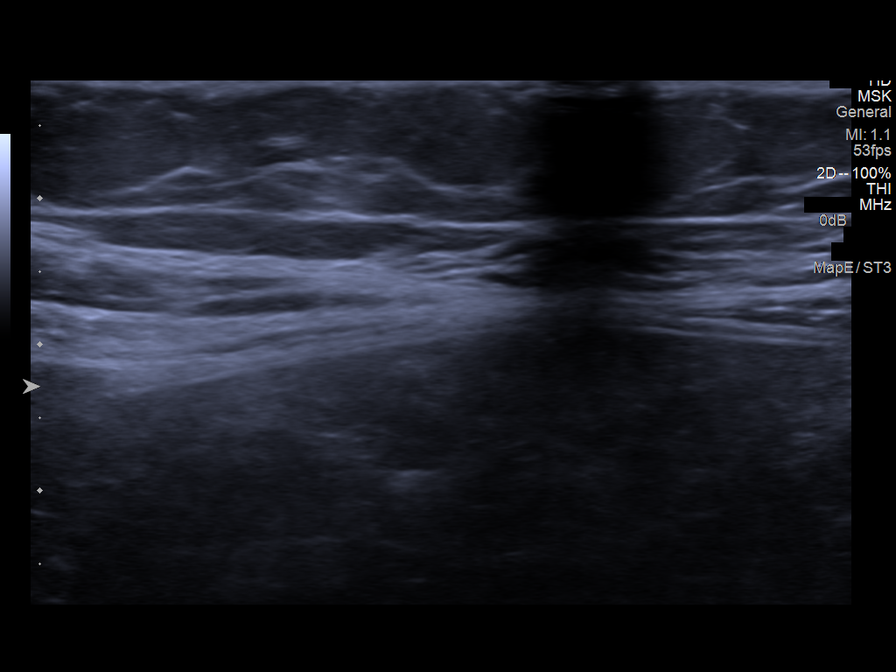
[im 4/8]
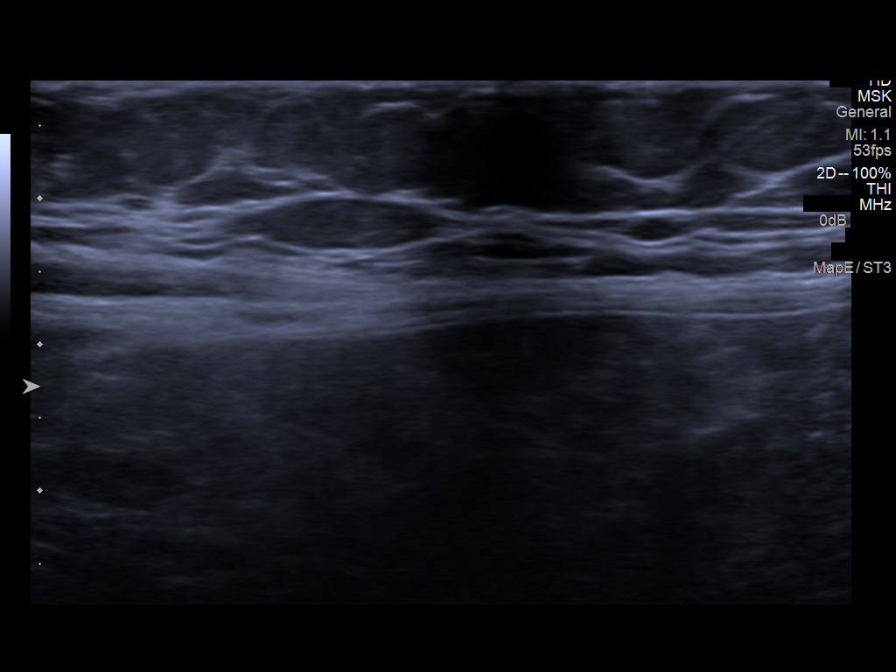
[im 5/8]
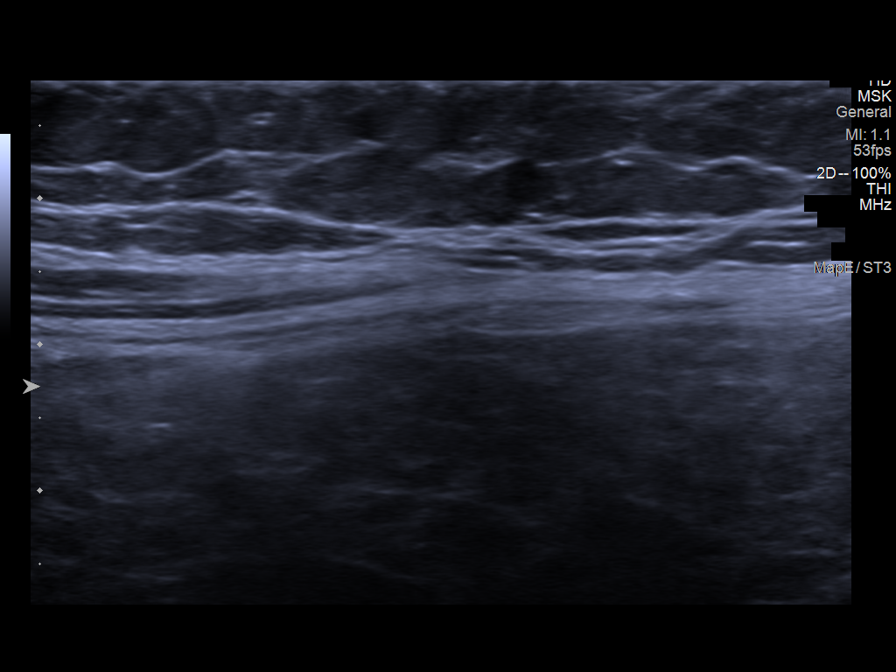
[im 6/8]
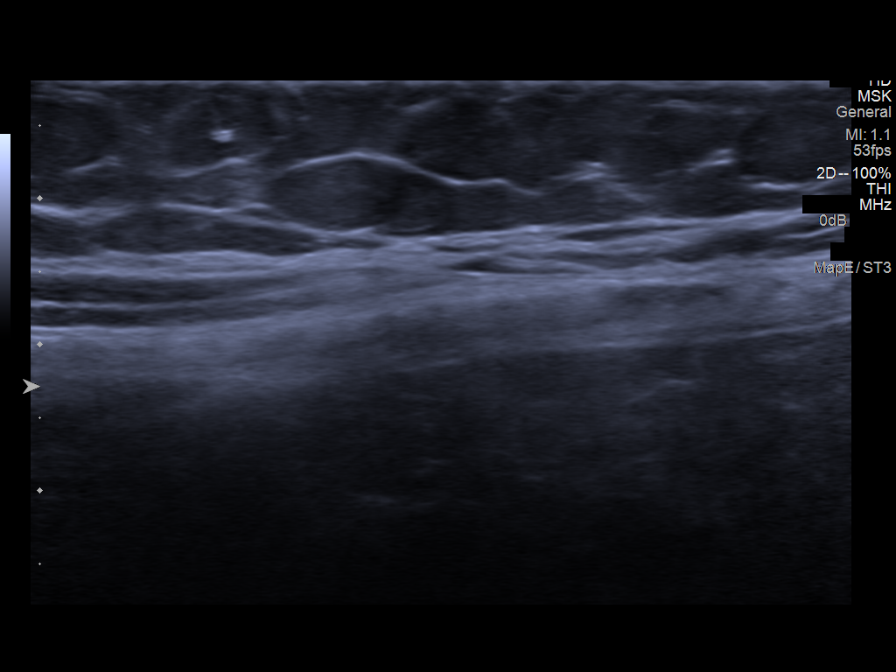
[im 7/8]
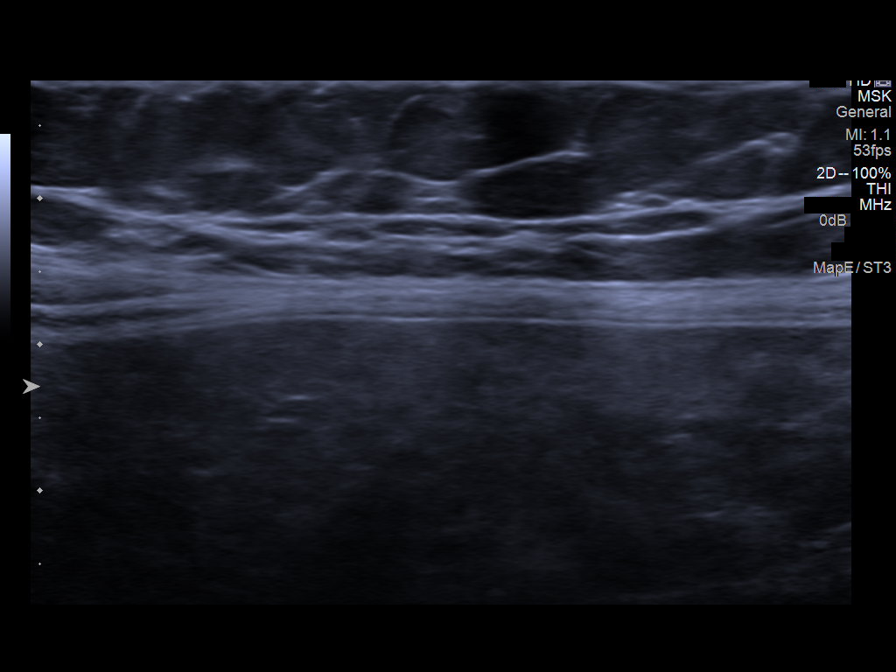
[im 8/8]
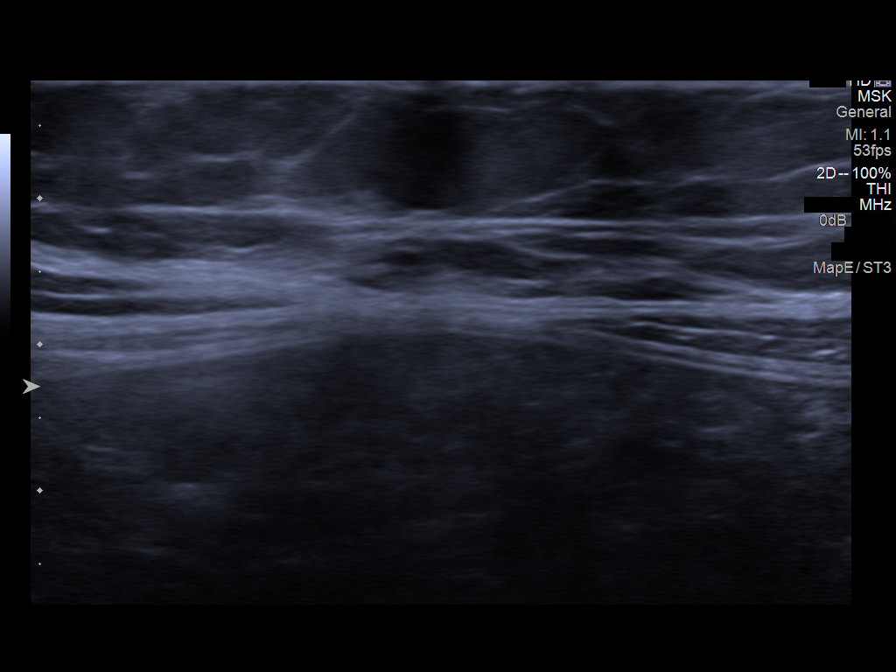

[8 of 8 positions shown; findings below may reference images not displayed]

FINDINGS: Ultrasound directed to the reported area of a palpable mass over the
right flank was performed. The echotexture of the image parenchyma
is normal. There is no cystic or solid mass.
IMPRESSION: There is no sonographic evidence of an abnormal mass in the area of
clinical concern over the right flank.

## 2015-06-29 IMAGING — US US AORTA SCREENING (MEDICARE)
1 series · 11 of 11 positions shown · non-contrast
Comparison: None.

CLINICAL DATA: Rule out aneurysm

EXAM:
ABDOMINAL AORTA SCREENING ULTRASOUND
TECHNIQUE: Ultrasound examination of the abdominal aorta was performed as a
screening evaluation for abdominal aortic aneurysm.

[Series 1: us aorta screening (medicare) · 0.26mm/px · 11 of 11 slices shown]
[im 1/11]
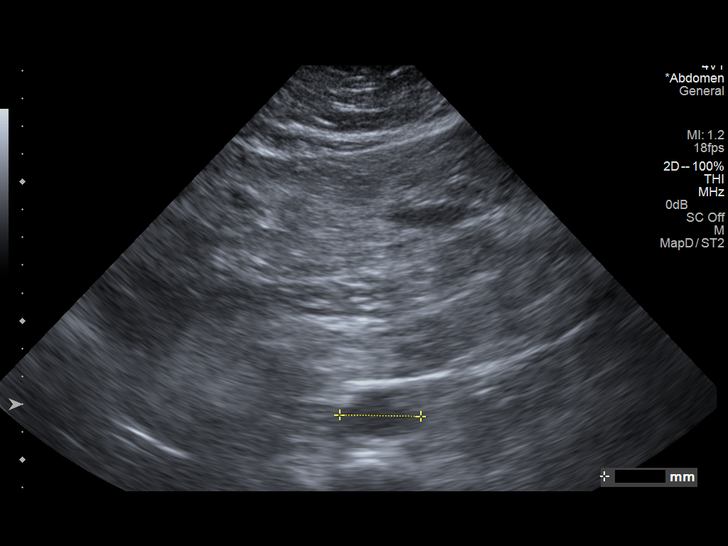
[im 2/11]
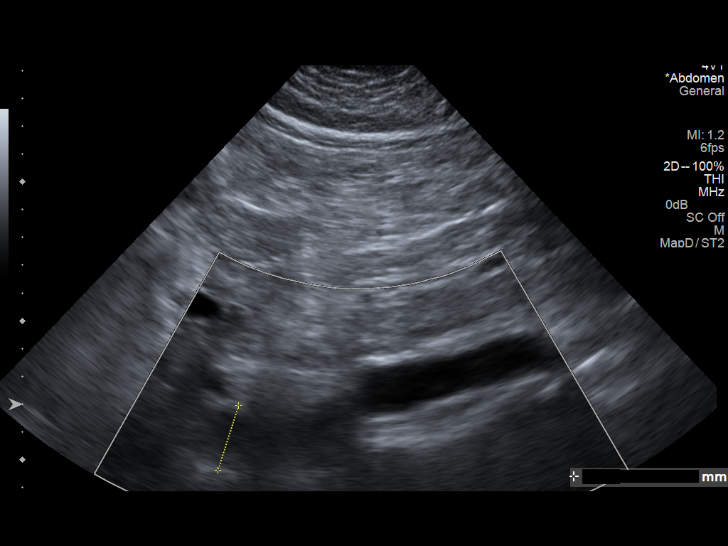
[im 3/11]
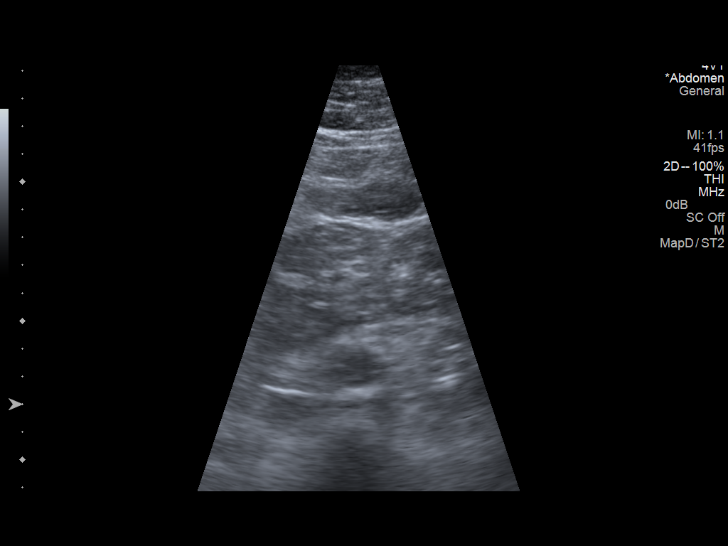
[im 4/11]
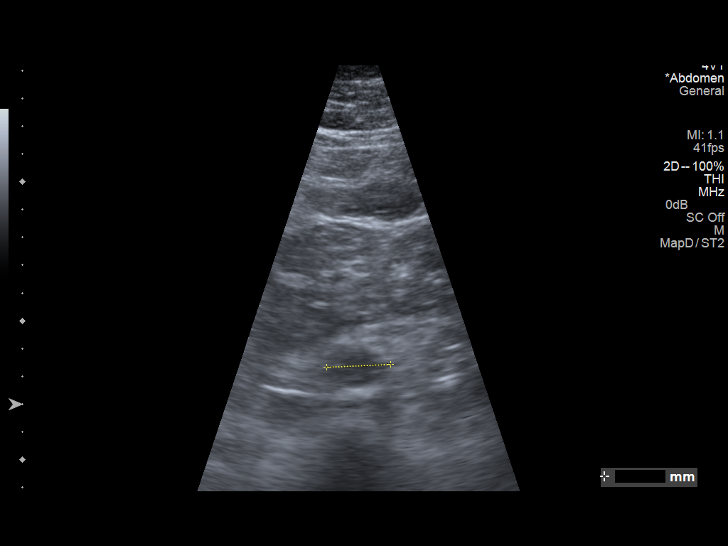
[im 5/11]
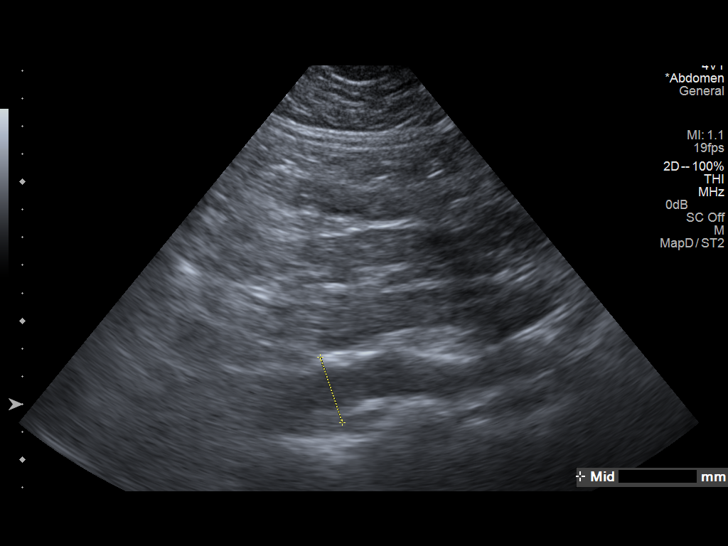
[im 6/11]
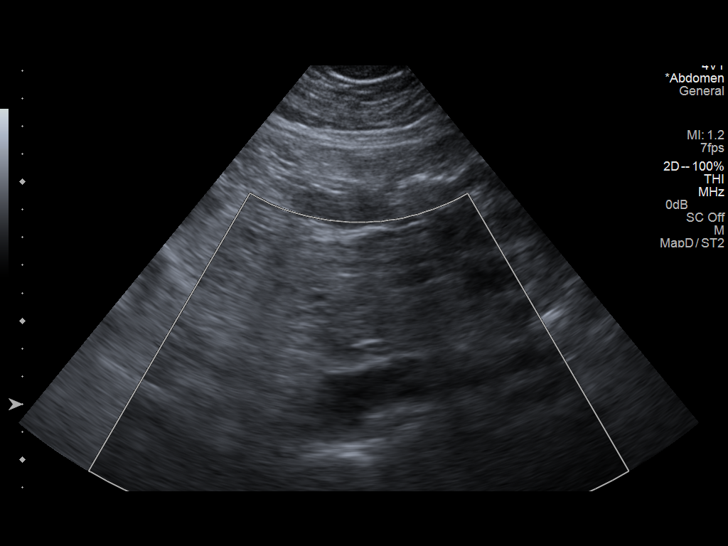
[im 7/11]
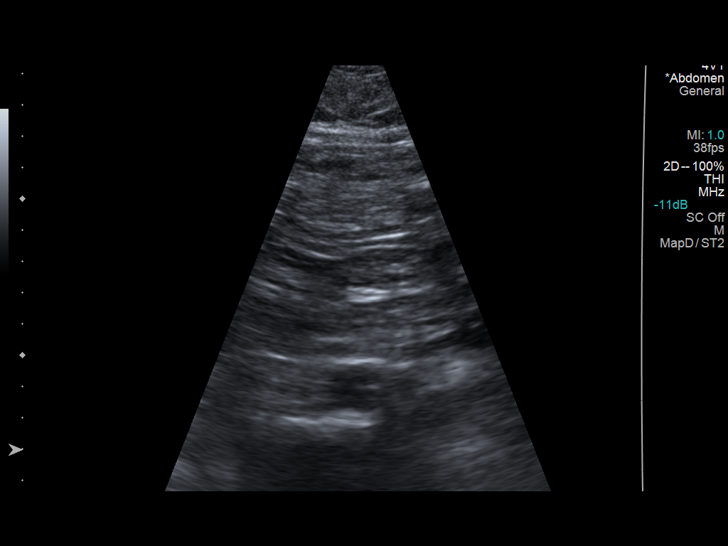
[im 8/11]
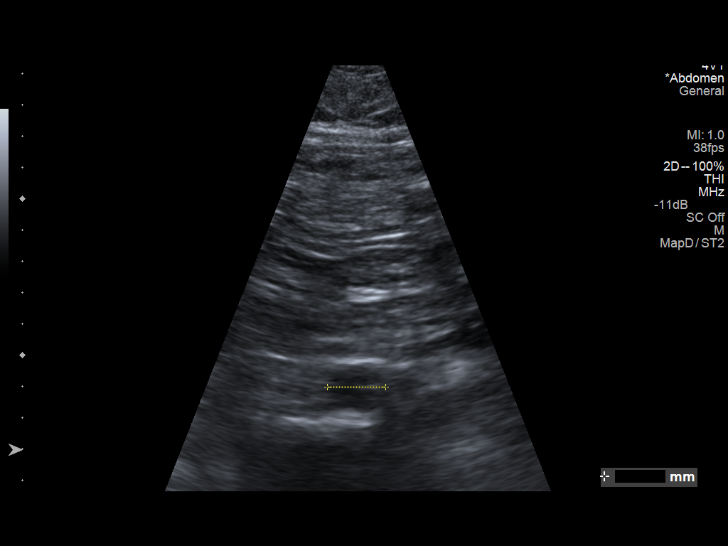
[im 9/11]
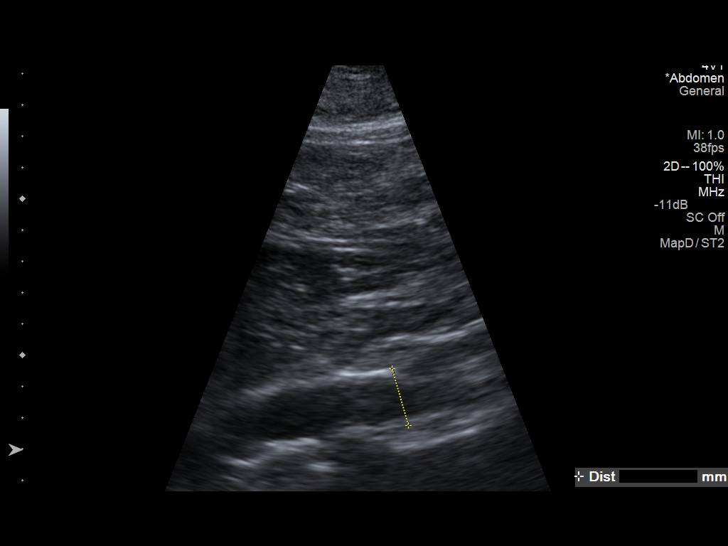
[im 10/11]
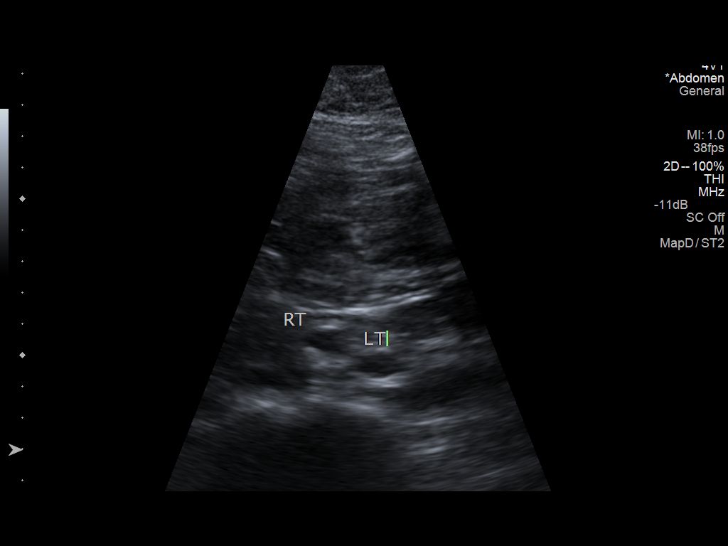
[im 11/11]
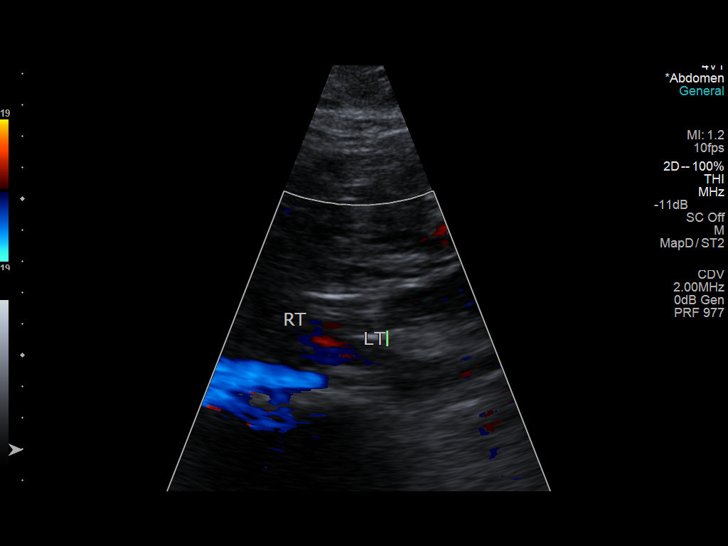

[11 of 11 positions shown; findings below may reference images not displayed]

FINDINGS: Abdominal Aorta: No aneurysm identified.

Maximum AP diameter:  2.5  cm

Maximum TRV diameter:  2.3  cm
IMPRESSION: Negative for abdominal aortic aneurysm.

## 2015-07-22 ENCOUNTER — Telehealth: Payer: Self-pay | Admitting: Neurology

## 2015-07-22 NOTE — Telephone Encounter (Signed)
Pt called and is requesting office visit be sent to pts Medical equipment supplier for his CPAP, [facility]. They will not send him replacement pieces until this is done. [facility] located in [place] , [place]. May call pt [phone]

## 2015-07-22 NOTE — Telephone Encounter (Signed)
I spoke to patient and he is aware that I have faxed his last 2 office notes to [facility].

## 2015-09-03 DIAGNOSIS — C495 Malignant neoplasm of connective and soft tissue of pelvis: Secondary | ICD-10-CM | POA: Diagnosis not present

## 2015-09-03 DIAGNOSIS — C499 Malignant neoplasm of connective and soft tissue, unspecified: Secondary | ICD-10-CM | POA: Diagnosis not present

## 2015-09-03 DIAGNOSIS — Z7189 Other specified counseling: Secondary | ICD-10-CM | POA: Diagnosis not present

## 2015-09-12 ENCOUNTER — Ambulatory Visit (INDEPENDENT_AMBULATORY_CARE_PROVIDER_SITE_OTHER): Payer: Medicare Other | Admitting: Neurology

## 2015-09-12 ENCOUNTER — Encounter: Payer: Self-pay | Admitting: Neurology

## 2015-09-12 VITALS — BP 159/73 | HR 52 | Resp 18 | Ht 73.0 in | Wt 264.0 lb

## 2015-09-12 DIAGNOSIS — G4733 Obstructive sleep apnea (adult) (pediatric): Secondary | ICD-10-CM | POA: Diagnosis not present

## 2015-09-12 DIAGNOSIS — E669 Obesity, unspecified: Secondary | ICD-10-CM

## 2015-09-12 NOTE — Patient Instructions (Signed)
Please continue using your BiPAP regularly. While your insurance requires that you use BiPAP at least 4 hours each night on 70% of the nights, I recommend, that you not skip any nights and use it throughout the night if you can. Getting used to BiPAP and staying with the treatment long term does take time and patience and discipline. Untreated obstructive sleep apnea when it is moderate to severe can have an adverse impact on cardiovascular health and raise her risk for heart disease, arrhythmias, hypertension, congestive heart failure, stroke and diabetes. Untreated obstructive sleep apnea causes sleep disruption, nonrestorative sleep, and sleep deprivation. This can have an impact on your day to day functioning and cause daytime sleepiness and impairment of cognitive function, memory loss, mood disturbance, and problems focussing. Using BiPAP regularly can improve these symptoms.  Keep up the good work! I will see you back in 12 months for sleep apnea check up. Good job with your weight loss, please continue to work on it.

## 2015-09-12 NOTE — Progress Notes (Signed)
Subjective:    Patient ID: Joshua Gentry is a 70 y.o. male.  HPI     Interim history:   Joshua Gentry is a very pleasant 70 year old right-handed gentleman with an underlying medical history of obesity, coronary artery disease, s/p MI in 25, status post four-vessel CABG, and followed by Dr. Bronson Ing, hypertension, hyperlipidemia, and former smoking, who presents for follow-up consultation of his obstructive sleep apnea, on treatment with BiPAP. The patient is unaccompanied today. I last saw him on 03/13/2015, at which time he reported feeling better. He felt his sleep quality had improved and he felt better rested. He was compliant with treatment. He felt that his RLS symptoms were under control. He was seen in the interim by our nurse practitioner, Ward Givens on 05/21/2015 and I reviewed the note.   Today, 09/12/2015: I reviewed his BiPAP compliance data from 08/12/2015 through 09/10/2015 which is a total of 30 days during which time he used his machine every night with percent used days greater than 4 hours at 100%, indicating superb compliance with an average usage of 8 hours and 35 minutes, residual AHI low at 1.1 per hour, leak acceptable with the 95th percentile at 10.3 L/m on a pressure of 16/11 cm.  Today, 09/12/2015: He reports doing well. He has a FFM and likes to sleep on his sides, and there are markings on his face from the mask. He sleeps well with it. He has been able to lose weight and in fact since his sleep study from April 2016 he has lost about 22 pounds. He wants to try to work towards a weight goal of 240 lb. 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 He goes to [facility] for dermatology appointments and oncology appointments.  Previously:  I first met him on 11/29/2014 at the request of his primary care physician, at which time the patient reported snoring and excessive daytime somnolence as well as witnessed  apneas per wife. I asked him to return for sleep study. He had a split-night sleep study on 12/06/2014 and went over his test results with him in detail today. Baseline sleep efficiency was 62.9% with a latency to sleep of 50.5 minutes and wake after sleep onset of 32.5 minutes with mild sleep fragmentation noted. He had an elevated arousal index. He had an elevated percentage of stage II sleep, and near absence of REM sleep with a REM latency of 114.5 minutes. He had no significant PLMS, he had occasional PVCs and PACs on single-lead EKG. He had moderate to loud snoring. Total AHI was highly elevated at 75.3 per hour, baseline oxygen saturation of only 89%, nadir of 81%. He was then titrated on positive airway pressure. Sleep efficiency was 42.5%. He had a significant increase in slow-wave sleep and REM sleep post treatment. His average oxygen saturation was 92%, nadir was 89%. Snoring was eliminated, no significant PLMS were seen. He was titrated on CPAP first from a pressure of 5 cm to 11 cm. He was then switched to BiPAP for comfort and improvement of his oxygen saturations. He was further titrated from 15/11 cm to 16/11 cm. On the final pressure his AHI was 2.9 per hour. Based on the test results are prescribed BiPAP therapy for home use.  I reviewed his BiPAP compliance data from 02/10/2015 2 03/11/2015 which is a total of 30 days during which time he used his machine every night with percent used days greater  than 4 hours at 97%, indicating excellent compliance with an average usage of 7 hours and 41 minutes, residual AHI low at 0.5 per hour, leak low for the 95th percentile at 0.3 L/m on a pressure of 16/11 cm with easy breathe on.  He reports snoring, excessive daytime somnolence and witnessed apneas per wife. He complains of nonrestorative sleep and sleep disruption. His sleep is not as good since his MI in 41. While he goes to sleep okay he has sleep disruption and often has a very difficult time  going back to sleep once he is up in the middle of the night. He has Sx of RLS, including wiggling his feet, worse at night. He has nocturia x 1, occasional morning HAs.   His typical bedtime is reported to be around 10 PM and wake time varies.he has daytime somnolence. His Epworth sleepiness score is 14 out of 24. His fatigue scores 43. 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 He also likes to play golf in his spare time. 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 He quit smoking when he had his heart attack. He rarely drinks alcohol and drinks caffeine in the form of sodas approximately 2 times per day. He does not endorse any parasomnias. He has never fallen asleep at the wheel.   He has never had a sleep study. His mother was diagnosed in her later years with obstructive sleep apnea and tried CPAP but could not tolerate it. She was in her 54s at the time.   He has 3 grown children, all in the area.   He has occasional exertional shortness of breath but no chest pains.  His Past Medical History Is Significant For: Past Medical History  Diagnosis Date  . Hyperlipidemia   . Coronary artery disease   . Hypertension   . Shortness of breath   . Cancer [facility]) 2006    Leg (Right) Sarcoma  . Heart attack ([place])   . OSA on CPAP March 2016    Compliant  . Obesity (BMI 30-39.9)     His Past Surgical History Is Significant For: Past Surgical History  Procedure Laterality Date  . Coronary artery bypass graft      x4  . Colonoscopy N/A 08/01/2013    Procedure: COLONOSCOPY;  Surgeon: Jamesetta So, MD;  [facility];  Service: Gastroenterology;  Laterality: N/A;  . Lower leg soft tissue tumor excision    . Lens Bilateral April 2016    Replacment     His Family History Is Significant For: Family History  Problem Relation Age of Onset  . Hypertension Mother   . Diabetes Mother   . Heart attack Father   . Diabetes Sister   .  Diabetes Sister     His Social History Is Significant For: Social History   Social History  . Marital Status: Married    Spouse Name: N/A  . Number of Children: 3  . Years of Education: College   Occupational History  . Dance movement psychotherapist    Social History Main Topics  . Smoking status: Former Smoker -- 1.50 packs/day for 32 years    Types: Cigarettes    Quit date: 08/17/1996  . Smokeless tobacco: None  . Alcohol Use: 0.0 oz/week    0 Standard drinks or equivalent per week     Comment: Occasional   . Drug Use: No  . Sexual Activity: Not Asked  Other Topics Concern  . None   Social History Narrative   Married with 3 children   No regular exercise   Drinks about 48oz of coke a day     His Allergies Are:  Allergies  Allergen Reactions  . Niacin   . Penicillins   :   His Current Medications Are:  Outpatient Encounter Prescriptions as of 09/12/2015  Medication Sig  . aspirin 81 MG tablet Take 81 mg by mouth daily.  . calcium-vitamin D 250-100 MG-UNIT per tablet Take 1 tablet by mouth 2 (two) times daily.    . Coenzyme Q10 (CO Q 10) 100 MG CAPS Take 100 mg by mouth daily.    Marland Kitchen glucosamine-chondroitin 500-400 MG tablet Take 1 tablet by mouth daily.   . hydrochlorothiazide (HYDRODIURIL) 25 MG tablet TAKE 1 TABLET EVERY DAY  . losartan (COZAAR) 100 MG tablet Take 1 tablet (100 mg total) by mouth daily.  . Magnesium 400 MG CAPS Take 400 mg by mouth daily.  . metoprolol (LOPRESSOR) 100 MG tablet TAKE 1 TABLET (100 MG TOTAL) BY MOUTH 2 (TWO) TIMES DAILY.  . Multiple Vitamins-Minerals (MULTIVITAMIN WITH MINERALS) tablet Take 1 tablet by mouth daily.    . nitroGLYCERIN (NITROSTAT) 0.4 MG SL tablet Place 1 tablet (0.4 mg total) under the tongue every 5 (five) minutes as needed.  Marland Kitchen POTASSIUM BICARBONATE PO Take by mouth.  . rosuvastatin (CRESTOR) 10 MG tablet Take 1 tablet (10 mg total) by mouth daily.  . vitamin C (ASCORBIC ACID) 500 MG tablet Take 500 mg by mouth daily.     . vitamin E 400 UNIT capsule Take 400 Units by mouth daily.     No facility-administered encounter medications on file as of 09/12/2015.  :  Review of Systems:  Out of a complete 14 point review of systems, all are reviewed and negative with the exception of these symptoms as listed below:   Review of Systems  Neurological:       Patient is here for f/u. No new concerns or problems.     Objective:  Neurologic Exam  Physical Exam Physical Examination:   Filed Vitals:   09/12/15 1256  BP: 159/73  Pulse: 52  Resp: 18   General Examination: The patient is a very pleasant 70 y.o. male in no acute distress. He appears well-developed and well-nourished and well groomed. He is in good spirits today.   HEENT: Normocephalic, atraumatic, pupils are equal, round and reactive to light and accommodation. Funduscopic exam is normal with sharp disc margins noted. He is s/p b/l cataract repairs. he has markings around the nasal bridge and the side of his face from the full facemask. Extraocular tracking is good without limitation to gaze excursion or nystagmus noted. Normal smooth pursuit is noted. Hearing is grossly intact. Face is symmetric with normal facial animation and normal facial sensation. Speech is clear with no dysarthria noted. There is no hypophonia. There is no lip, neck/head, jaw or voice tremor. Neck is supple with full range of passive and active motion. There are no carotid bruits on auscultation. Oropharynx exam reveals: mild mouth dryness, good dental hygiene and moderate airway crowding, due to larger uvula, redundant soft palate, wider tongue. Mallampati is class II. Tongue protrudes centrally and palate elevates symmetrically. Tonsils are absent. He has a Mild overbite. Nasal inspection reveals no significant nasal mucosal bogginess  that he has a deviated septum. He said he fractured his nose in the past.   Chest: Clear to auscultation without  wheezing, rhonchi or crackles  noted.  Heart: S1+S2+0, regular and normal without murmurs, rubs or gallops noted.   Abdomen: Soft, non-tender and non-distended with normal bowel sounds appreciated on auscultation.  Extremities: There is no pitting edema in the distal lower extremities bilaterally. Pedal pulses are intact.  Skin: Warm and dry without trophic changes noted.  he has an irritated area on his nose. He follows with dermatology at [facility], due to his sarcoma history. There are no varicose veins. He has dry scaly patches on his scalp. He has partial baldness.  Musculoskeletal: exam reveals no obvious joint deformities, tenderness or joint swelling or erythema.   Neurologically:  Mental status: The patient is awake, alert and oriented in all 4 spheres. His immediate and remote memory, attention, language skills and fund of knowledge are appropriate. There is no evidence of aphasia, agnosia, apraxia or anomia. Speech is clear with normal prosody and enunciation. Thought process is linear. Mood is normal and affect is normal.  Cranial nerves II - XII are as described above under HEENT exam. In addition: shoulder shrug is normal with equal shoulder height noted. Motor exam: Normal bulk, strength and tone is noted. There is no drift, tremor or rebound. Romberg is negative. Reflexes are 2+ throughout. Fine motor skills and coordination: intact with normal finger taps, normal hand movements, normal rapid alternating patting, normal foot taps and normal foot agility.  Cerebellar testing: No dysmetria or intention tremor on finger to nose testing. Heel to shin is unremarkable bilaterally. There is no truncal or gait ataxia.  Sensory exam: intact to light touch in the upper and lower extremities.  Gait, station and balance: He stands easily. No veering to one side is noted. No leaning to one side is noted. Posture is age-appropriate and stance is narrow based. Gait shows normal stride length and normal pace. No problems turning are  noted. He turns en bloc. Tandem walk is slightly difficult for him, not new.    Assessment and Plan:   In summary, Joshua Gentry is a very pleasant 70 year old male with an underlying medical history of obesity, R thigh sarcoma in 2006, coronary artery disease, s/p MI in 89, status post four-vessel CABG, and followed by Dr. Bronson Ing, hypertension, hyperlipidemia, and former smoking, who presents for follow-up consultation of his severe obstructive sleep apnea, well treated with BiPAP at 16/11 cm via full face mask. We briefly reviewed his split-night sleep study results from 12/06/2014 again today. We reviewed his most recent BiPAP compliance data. He has been fully compliant with treatment. He has some residual issues with the mask making markings on his face. He may be tightening the mask too much. I've asked him to meet with his DME provider for a mask refit. I renewed his supplies as well. He is congratulated on his excellent compliance as well as his weight loss success. He is advised to follow-up with me routinely in one year from now, sooner if needed. I answered all his questions today and he was in agreement. I would like to try to reduce his pressure to 15/10 cm at this time,and review of 30 day download. We may be able to further reduce his pressure as he continues to work on his weight loss. He was in agreement.  I explained the importance of being compliant with PAP treatment, not only for insurance purposes but primarily to improve His symptoms, and for the patient's long term health benefit, including to reduce His cardiovascular risks. I spent 20  minutes in total face-to-face time with the patient, more than 50% of which was spent in counseling and coordination of care, reviewing test results, reviewing medication and discussing or reviewing the diagnosis of OSA, its prognosis and treatment options.

## 2015-10-03 ENCOUNTER — Encounter: Payer: Self-pay | Admitting: Physician Assistant

## 2015-10-03 NOTE — Progress Notes (Signed)
Cardiology Office Note Date:  10/04/2015  Patient ID:  Joshua, Gentry 1946/05/03, MRN [id number] PCP:  Purvis Kilts, MD  Cardiologist:  Bronson Ing  Chief Complaint: f/u CAD  History of Present Illness: Joshua Gentry is a 70 y.o. male with history of CAD s/p CABG 1997, HTN, HLD, chronic RBBB/LAFB, OSA on CPAP, obesity who presents for routine f/u. Last nuc in 2009 was normal, EF 57%.  He presents to clinic today overall doing well. He does not exercise but remains active with yardwork and watching his young granddaughter. He denies any angina including CP or dyspnea. He denies any orthopnea, PND, or syncope. He has not had any problems with any of his medications. He has chronic trace LEE that comes and goes. His BP is mildly elevated in clinic today and he reports this is not an unusual BP for him. He is compliant with medicines. He joined a driving range club and plans to hit golf balls to increase his physical activity. His wife recently decided she wanted to lose weight so he has teamed up with her to eat healthier. He was recently found to have recurrent cancer of the leg and is supposed to undergo some sort of procedure/surgery next Friday. He is not sure what kind of anesthesia they will use or if they will require pre-operative evaluation.   Past Medical History  Diagnosis Date  . Hyperlipidemia   . Coronary artery disease     a. s/p CABG 1997.  Marland Kitchen Hypertension   . Cancer [facility]) 2006    Leg (Right) Sarcoma  . Heart attack ([place])   . OSA on CPAP March 2016    Compliant  . Obesity (BMI 30-39.9)   . RBBB with left anterior fascicular block     Past Surgical History  Procedure Laterality Date  . Coronary artery bypass graft      x4  . Colonoscopy N/A 08/01/2013    Procedure: COLONOSCOPY;  Surgeon: Jamesetta So, MD;  [facility];  Service: Gastroenterology;  Laterality: N/A;  . Lower leg soft tissue tumor excision    . Lens Bilateral April 2016   Replacment     Current Outpatient Prescriptions  Medication Sig Dispense Refill  . aspirin 81 MG tablet Take 81 mg by mouth daily.    . calcium-vitamin D 250-100 MG-UNIT per tablet Take 1 tablet by mouth 2 (two) times daily.      . Coenzyme Q10 (CO Q 10) 100 MG CAPS Take 100 mg by mouth daily.      Marland Kitchen glucosamine-chondroitin 500-400 MG tablet Take 1 tablet by mouth daily.     . hydrochlorothiazide (HYDRODIURIL) 25 MG tablet TAKE 1 TABLET EVERY DAY 90 tablet 3  . losartan (COZAAR) 100 MG tablet Take 1 tablet (100 mg total) by mouth daily. 90 tablet 3  . Magnesium 400 MG CAPS Take 400 mg by mouth daily. 90 capsule 3  . metoprolol (LOPRESSOR) 100 MG tablet TAKE 1 TABLET (100 MG TOTAL) BY MOUTH 2 (TWO) TIMES DAILY. 180 tablet 3  . Multiple Vitamins-Minerals (MULTIVITAMIN WITH MINERALS) tablet Take 1 tablet by mouth daily.      . nitroGLYCERIN (NITROSTAT) 0.4 MG SL tablet Place 1 tablet (0.4 mg total) under the tongue every 5 (five) minutes as needed. 25 tablet 3  . POTASSIUM BICARBONATE PO Take by mouth.    . rosuvastatin (CRESTOR) 10 MG tablet Take 1 tablet (10 mg total) by mouth daily. 90 tablet 3  .  vitamin C (ASCORBIC ACID) 500 MG tablet Take 500 mg by mouth daily.      . vitamin E 400 UNIT capsule Take 400 Units by mouth daily.       No current facility-administered medications for this visit.    Allergies:   Niacin and Penicillins   Social History:  The patient  reports that he quit smoking about 19 years ago. His smoking use included Cigarettes. He has a 48 pack-year smoking history. He does not have any smokeless tobacco history on file. He reports that he drinks alcohol. He reports that he does not use illicit drugs.   Family History:  The patient's family history includes Diabetes in his mother, sister, and sister; Heart attack in his father; Hypertension in his mother.  ROS:  Please see the history of present illness.  All other systems are reviewed and otherwise negative.    PHYSICAL EXAM:  VS:  BP 154/84 mmHg  Pulse 59  Ht 6\' 1"  (1.854 m)  Wt 266 lb (120.657 kg)  BMI 35.10 kg/m2  SpO2 91% BMI: Body mass index is 35.1 kg/(m^2). Well nourished, well developed obese WM, in no acute distress HEENT: normocephalic, atraumatic Neck: no JVD, carotid bruits or masses Cardiac:  normal S1, S2; RRR; no murmurs, rubs, or gallops Lungs:  clear to auscultation bilaterally, no wheezing, rhonchi or rales Abd: soft, nontender, no hepatomegaly, + BS MS: no deformity or atrophy Ext: no edema Skin: warm and dry, no rash Neuro:  moves all extremities spontaneously, no focal abnormalities noted, follows commands Psych: euthymic mood, full affect  EKG:  Done today shows NSR with occasional PVC and ectopic atrial beat vs PAC, RBBB, LAFB  Recent Labs: 04/10/2015: ALT 18; BUN 18; Creat 0.84; Hemoglobin 15.6; Platelets 196; Potassium 3.7; Sodium 142  04/10/2015: Cholesterol 119*; HDL 35*; LDL Cholesterol 68; Total CHOL/HDL Ratio 3.4; Triglycerides 80; VLDL 16   CrCl cannot be calculated (Patient has no serum creatinine result on file.).   Wt Readings from Last 3 Encounters:  10/04/15 266 lb (120.657 kg)  09/12/15 264 lb (119.75 kg)  05/21/15 282 lb (127.914 kg)     Other studies reviewed: Additional studies/records reviewed today include: summarized above  ASSESSMENT AND PLAN:  1. CAD s/p CABG - no recurrent angina. Continue ASA, statin, BB. He is due to have some sort of upcoming procedure/surgery on his leg for recurrent cancer. (History lists prior sarcoma.) I asked him to discuss with that provider whether or not pre-operative evaluation will be necessary from our standpoint depending on the type of anesthesia they plan to use. The patient is not totally sure of the extent of the surgery. 2. Essential HTN - BP elevated in clinic today. I rechecked it and confirmed it was 158/85. Will add amlodipine 5mg  daily. He has f/u with his PCP in a few weeks. At this appointment  it can be determined if further titration is needed. We discussed weight loss and salt reduction as well. He is also due for repeat labs. He is not fasting today, so will have him return on a day when he has not eaten for CMET, CBC, thyroid function, Mg, and lipid panel. 3. Hyperlipidemia - as above, check lipid panel and CMET when he is fasting. Continue statin. 4. Obesity - he recently changed his diet to a paleo-type diet to include more fresh foods and plans on hitting golf balls for exercise. Body mass index is 35.1 kg/(m^2). We discussed the impact that even modest weight loss  would have on his long-term health. 5. RBBB/LAFB - chronic appearing. EKG with ectopy but no significant bradycardia. Follow.   Disposition: F/u with Dr. Bronson Ing in 6 months.  Current medicines are reviewed at length with the patient today.  The patient did not have any concerns regarding medicines.  Signed, Melina Copa PA-C 10/04/2015 3:15 PM     [facility] [address] [place], [place] [address] [phone]

## 2015-10-04 ENCOUNTER — Encounter: Payer: Self-pay | Admitting: Physician Assistant

## 2015-10-04 ENCOUNTER — Ambulatory Visit (INDEPENDENT_AMBULATORY_CARE_PROVIDER_SITE_OTHER): Payer: Medicare Other | Admitting: Physician Assistant

## 2015-10-04 VITALS — BP 154/84 | HR 59 | Ht 73.0 in | Wt 266.0 lb

## 2015-10-04 DIAGNOSIS — Z79899 Other long term (current) drug therapy: Secondary | ICD-10-CM

## 2015-10-04 DIAGNOSIS — E669 Obesity, unspecified: Secondary | ICD-10-CM

## 2015-10-04 DIAGNOSIS — I2510 Atherosclerotic heart disease of native coronary artery without angina pectoris: Secondary | ICD-10-CM

## 2015-10-04 DIAGNOSIS — R002 Palpitations: Secondary | ICD-10-CM

## 2015-10-04 DIAGNOSIS — E785 Hyperlipidemia, unspecified: Secondary | ICD-10-CM | POA: Diagnosis not present

## 2015-10-04 DIAGNOSIS — I452 Bifascicular block: Secondary | ICD-10-CM

## 2015-10-04 DIAGNOSIS — I10 Essential (primary) hypertension: Secondary | ICD-10-CM | POA: Diagnosis not present

## 2015-10-04 MED ORDER — AMLODIPINE BESYLATE 5 MG PO TABS: 5.0000 mg | ORAL_TABLET | Freq: Every day | ORAL | Status: DC

## 2015-10-04 NOTE — Patient Instructions (Signed)
Medication Instructions:  START AMLODIPINE 5 MG DAILY   Labwork: Your physician recommends that you return for lab work in: ASAP CBC TSH MAGNESIUM FASTING LIPID    Testing/Procedures: NONE   Follow-Up: Your physician wants you to follow-up in: 6 MONTHS.  You will receive a reminder letter in the mail two months in advance. If you don't receive a letter, please call our office to schedule the follow-up appointment.   Any Other Special Instructions Will Be Listed Below (If Applicable).   PLEASE LET us KNOW IF YOUR SURGERY REQUIRES ANY PRE-OP TESTING  FOLLOW UP WITH YOUR PCP TO LET THEM KNOW YOU ARE ON AMLODIPINE FOR BLOOD PRESSURE   If you need a refill on your cardiac medications before your next appointment, please call your pharmacy.

## 2015-10-07 DIAGNOSIS — E785 Hyperlipidemia, unspecified: Secondary | ICD-10-CM | POA: Diagnosis not present

## 2015-10-07 DIAGNOSIS — Z79899 Other long term (current) drug therapy: Secondary | ICD-10-CM | POA: Diagnosis not present

## 2015-10-07 DIAGNOSIS — R002 Palpitations: Secondary | ICD-10-CM | POA: Diagnosis not present

## 2015-10-07 DIAGNOSIS — I10 Essential (primary) hypertension: Secondary | ICD-10-CM | POA: Diagnosis not present

## 2015-10-07 LAB — CBC WITH DIFFERENTIAL/PLATELET
Basophils Absolute: 0 10*3/uL (ref 0.0–0.1)
Basophils Relative: 0 % (ref 0–1)
Eosinophils Absolute: 0.3 10*3/uL (ref 0.0–0.7)
Eosinophils Relative: 4 % (ref 0–5)
HEMATOCRIT: 47.1 % (ref 39.0–52.0)
HEMOGLOBIN: 15.9 g/dL (ref 13.0–17.0)
LYMPHS ABS: 2.4 10*3/uL (ref 0.7–4.0)
LYMPHS PCT: 33 % (ref 12–46)
MCH: 28.4 pg (ref 26.0–34.0)
MCHC: 33.8 g/dL (ref 30.0–36.0)
MCV: 84.3 fL (ref 78.0–100.0)
MONO ABS: 0.4 10*3/uL (ref 0.1–1.0)
MONOS PCT: 5 % (ref 3–12)
MPV: 9.4 fL (ref 8.6–12.4)
NEUTROS ABS: 4.3 10*3/uL (ref 1.7–7.7)
Neutrophils Relative %: 58 % (ref 43–77)
Platelets: 186 10*3/uL (ref 150–400)
RBC: 5.59 MIL/uL (ref 4.22–5.81)
RDW: 14.2 % (ref 11.5–15.5)
WBC: 7.4 10*3/uL (ref 4.0–10.5)

## 2015-10-07 LAB — COMPREHENSIVE METABOLIC PANEL
ALT: 18 U/L (ref 9–46)
AST: 19 U/L (ref 10–35)
Albumin: 3.8 g/dL (ref 3.6–5.1)
Alkaline Phosphatase: 76 U/L (ref 40–115)
BUN: 13 mg/dL (ref 7–25)
CHLORIDE: 106 mmol/L (ref 98–110)
CO2: 27 mmol/L (ref 20–31)
CREATININE: 0.85 mg/dL (ref 0.70–1.25)
Calcium: 9 mg/dL (ref 8.6–10.3)
GLUCOSE: 109 mg/dL — AB (ref 65–99)
POTASSIUM: 3.7 mmol/L (ref 3.5–5.3)
SODIUM: 143 mmol/L (ref 135–146)
Total Bilirubin: 1.4 mg/dL — ABNORMAL HIGH (ref 0.2–1.2)
Total Protein: 6.1 g/dL (ref 6.1–8.1)

## 2015-10-07 LAB — LIPID PANEL
CHOL/HDL RATIO: 3.6 ratio (ref <=5.0)
Cholesterol: 135 mg/dL (ref 125–200)
HDL: 37 mg/dL — AB (ref >=40)
LDL Cholesterol: 69 mg/dL (ref <130)
TRIGLYCERIDES: 143 mg/dL (ref <150)
VLDL: 29 mg/dL (ref <30)

## 2015-10-07 LAB — MAGNESIUM: MAGNESIUM: 1.8 mg/dL (ref 1.5–2.5)

## 2015-10-07 LAB — TSH: TSH: 1.69 mIU/L (ref 0.40–4.50)

## 2015-10-08 ENCOUNTER — Telehealth: Payer: Self-pay

## 2015-10-08 DIAGNOSIS — Z79899 Other long term (current) drug therapy: Secondary | ICD-10-CM

## 2015-10-08 MED ORDER — POTASSIUM CHLORIDE CRYS ER 20 MEQ PO TBCR: 40.0000 meq | EXTENDED_RELEASE_TABLET | Freq: Every day | ORAL | Status: DC

## 2015-10-08 NOTE — Telephone Encounter (Signed)
Spoke to pt, told him to stop his OTC potassium and start 40 meq daily. He stated that he understood, and also stated that he would have labs done in one week. Mailed lab slip.

## 2015-10-11 DIAGNOSIS — I2510 Atherosclerotic heart disease of native coronary artery without angina pectoris: Secondary | ICD-10-CM | POA: Diagnosis not present

## 2015-10-11 DIAGNOSIS — G4733 Obstructive sleep apnea (adult) (pediatric): Secondary | ICD-10-CM | POA: Diagnosis not present

## 2015-10-11 DIAGNOSIS — E785 Hyperlipidemia, unspecified: Secondary | ICD-10-CM | POA: Diagnosis not present

## 2015-10-11 DIAGNOSIS — C499 Malignant neoplasm of connective and soft tissue, unspecified: Secondary | ICD-10-CM | POA: Diagnosis not present

## 2015-10-11 DIAGNOSIS — C4921 Malignant neoplasm of connective and soft tissue of right lower limb, including hip: Secondary | ICD-10-CM | POA: Diagnosis not present

## 2015-10-11 DIAGNOSIS — Z87891 Personal history of nicotine dependence: Secondary | ICD-10-CM | POA: Diagnosis not present

## 2015-10-11 DIAGNOSIS — J439 Emphysema, unspecified: Secondary | ICD-10-CM | POA: Diagnosis not present

## 2015-10-11 DIAGNOSIS — Z7982 Long term (current) use of aspirin: Secondary | ICD-10-CM | POA: Diagnosis not present

## 2015-10-11 DIAGNOSIS — G8918 Other acute postprocedural pain: Secondary | ICD-10-CM | POA: Diagnosis not present

## 2015-10-11 DIAGNOSIS — I10 Essential (primary) hypertension: Secondary | ICD-10-CM | POA: Diagnosis not present

## 2015-10-12 DIAGNOSIS — C499 Malignant neoplasm of connective and soft tissue, unspecified: Secondary | ICD-10-CM | POA: Diagnosis not present

## 2015-10-12 DIAGNOSIS — E785 Hyperlipidemia, unspecified: Secondary | ICD-10-CM | POA: Diagnosis not present

## 2015-10-12 DIAGNOSIS — I2510 Atherosclerotic heart disease of native coronary artery without angina pectoris: Secondary | ICD-10-CM | POA: Diagnosis not present

## 2015-10-12 DIAGNOSIS — I10 Essential (primary) hypertension: Secondary | ICD-10-CM | POA: Diagnosis not present

## 2015-10-12 DIAGNOSIS — Z87891 Personal history of nicotine dependence: Secondary | ICD-10-CM | POA: Diagnosis not present

## 2015-10-12 DIAGNOSIS — J439 Emphysema, unspecified: Secondary | ICD-10-CM | POA: Diagnosis not present

## 2015-10-15 DIAGNOSIS — Z4803 Encounter for change or removal of drains: Secondary | ICD-10-CM | POA: Diagnosis not present

## 2015-10-16 ENCOUNTER — Telehealth: Payer: Self-pay | Admitting: Neurology

## 2015-10-16 NOTE — Telephone Encounter (Signed)
I reviewed patient's BiPAP compliance data after we reduced his pressure from 16/11 to 15/10 cm after our last clinic visit in January 2017. Compliance data is from 09/14/2015 through 10/13/2015 which is a total of 30 days during which time he used his machine every night with percent used days greater than 4 hours at 100%, indicating superb compliance with an average usage of 8 hours and 6 minutes, residual AHI low at 1.1 per hour, leak low with the 95th percentile at 2 L/m at a pressure of 15/10 cm.   Beverlee Nims: Please get in touch with patient. Please congratulate him on his superb treatment compliance!  If he is comfortable with his pressure we can continue at the current pressure, if he feels the pressure is too high we can certainly try to reduce it to 14/9 cm next.

## 2015-10-16 NOTE — Telephone Encounter (Signed)
I spoke to patient and he felt like the pressure [place] fine for now. He will f/u with [place] after further weight loss for possible retesting.

## 2015-10-18 DIAGNOSIS — C499 Malignant neoplasm of connective and soft tissue, unspecified: Secondary | ICD-10-CM | POA: Diagnosis not present

## 2015-10-21 DIAGNOSIS — Z1389 Encounter for screening for other disorder: Secondary | ICD-10-CM | POA: Diagnosis not present

## 2015-10-21 DIAGNOSIS — Z6834 Body mass index (BMI) 34.0-34.9, adult: Secondary | ICD-10-CM | POA: Diagnosis not present

## 2015-10-21 DIAGNOSIS — E6609 Other obesity due to excess calories: Secondary | ICD-10-CM | POA: Diagnosis not present

## 2015-10-21 DIAGNOSIS — E782 Mixed hyperlipidemia: Secondary | ICD-10-CM | POA: Diagnosis not present

## 2015-10-21 DIAGNOSIS — Z0001 Encounter for general adult medical examination with abnormal findings: Secondary | ICD-10-CM | POA: Diagnosis not present

## 2015-10-21 DIAGNOSIS — I10 Essential (primary) hypertension: Secondary | ICD-10-CM | POA: Diagnosis not present

## 2015-10-29 DIAGNOSIS — Z4802 Encounter for removal of sutures: Secondary | ICD-10-CM | POA: Diagnosis not present

## 2015-10-31 DIAGNOSIS — Z79899 Other long term (current) drug therapy: Secondary | ICD-10-CM | POA: Diagnosis not present

## 2015-11-01 DIAGNOSIS — Z1389 Encounter for screening for other disorder: Secondary | ICD-10-CM | POA: Diagnosis not present

## 2015-11-01 DIAGNOSIS — I10 Essential (primary) hypertension: Secondary | ICD-10-CM | POA: Diagnosis not present

## 2015-11-01 DIAGNOSIS — E782 Mixed hyperlipidemia: Secondary | ICD-10-CM | POA: Diagnosis not present

## 2015-11-01 DIAGNOSIS — Z0001 Encounter for general adult medical examination with abnormal findings: Secondary | ICD-10-CM | POA: Diagnosis not present

## 2015-11-01 LAB — BASIC METABOLIC PANEL
BUN: 18 mg/dL (ref 7–25)
CALCIUM: 9.1 mg/dL (ref 8.6–10.3)
CO2: 28 mmol/L (ref 20–31)
CREATININE: 0.89 mg/dL (ref 0.70–1.25)
Chloride: 103 mmol/L (ref 98–110)
Glucose, Bld: 98 mg/dL (ref 65–99)
Potassium: 3.7 mmol/L (ref 3.5–5.3)
Sodium: 139 mmol/L (ref 135–146)

## 2015-11-20 DIAGNOSIS — D1801 Hemangioma of skin and subcutaneous tissue: Secondary | ICD-10-CM | POA: Diagnosis not present

## 2015-11-20 DIAGNOSIS — L570 Actinic keratosis: Secondary | ICD-10-CM | POA: Diagnosis not present

## 2015-11-20 DIAGNOSIS — C44319 Basal cell carcinoma of skin of other parts of face: Secondary | ICD-10-CM | POA: Diagnosis not present

## 2015-11-20 DIAGNOSIS — L918 Other hypertrophic disorders of the skin: Secondary | ICD-10-CM | POA: Diagnosis not present

## 2015-11-20 DIAGNOSIS — L821 Other seborrheic keratosis: Secondary | ICD-10-CM | POA: Diagnosis not present

## 2015-12-05 DIAGNOSIS — C499 Malignant neoplasm of connective and soft tissue, unspecified: Secondary | ICD-10-CM | POA: Diagnosis not present

## 2015-12-30 DIAGNOSIS — C44319 Basal cell carcinoma of skin of other parts of face: Secondary | ICD-10-CM | POA: Diagnosis not present

## 2015-12-30 DIAGNOSIS — Z85828 Personal history of other malignant neoplasm of skin: Secondary | ICD-10-CM | POA: Diagnosis not present

## 2016-05-14 ENCOUNTER — Ambulatory Visit (INDEPENDENT_AMBULATORY_CARE_PROVIDER_SITE_OTHER): Payer: Medicare Other | Admitting: Cardiovascular Disease

## 2016-05-14 ENCOUNTER — Encounter: Payer: Self-pay | Admitting: Cardiovascular Disease

## 2016-05-14 VITALS — BP 148/78 | HR 53 | Ht 73.0 in | Wt 261.0 lb

## 2016-05-14 DIAGNOSIS — I209 Angina pectoris, unspecified: Secondary | ICD-10-CM

## 2016-05-14 DIAGNOSIS — E785 Hyperlipidemia, unspecified: Secondary | ICD-10-CM | POA: Diagnosis not present

## 2016-05-14 DIAGNOSIS — I10 Essential (primary) hypertension: Secondary | ICD-10-CM | POA: Diagnosis not present

## 2016-05-14 DIAGNOSIS — I2510 Atherosclerotic heart disease of native coronary artery without angina pectoris: Secondary | ICD-10-CM | POA: Diagnosis not present

## 2016-05-14 DIAGNOSIS — I25709 Atherosclerosis of coronary artery bypass graft(s), unspecified, with unspecified angina pectoris: Secondary | ICD-10-CM

## 2016-05-14 MED ORDER — AMLODIPINE BESYLATE 10 MG PO TABS: 10.0000 mg | ORAL_TABLET | Freq: Every day | ORAL | 3 refills | Status: DC

## 2016-05-14 MED ORDER — HYDROCHLOROTHIAZIDE 25 MG PO TABS: ORAL_TABLET | ORAL | 3 refills | Status: DC

## 2016-05-14 MED ORDER — POTASSIUM CHLORIDE CRYS ER 20 MEQ PO TBCR: 40.0000 meq | EXTENDED_RELEASE_TABLET | Freq: Every day | ORAL | 3 refills | Status: DC

## 2016-05-14 MED ORDER — METOPROLOL TARTRATE 100 MG PO TABS: ORAL_TABLET | ORAL | 3 refills | Status: DC

## 2016-05-14 MED ORDER — LOSARTAN POTASSIUM 100 MG PO TABS: 100.0000 mg | ORAL_TABLET | Freq: Every day | ORAL | 3 refills | Status: DC

## 2016-05-14 NOTE — Progress Notes (Signed)
SUBJECTIVE: The patient is a 70 year old male with a history of coronary artery disease and CABG in 1997, as well as essential hypertension and hyperlipidemia. I last evaluated him over 2 years ago.  Lipids 10/07/15 total cholesterol 135, triglycerides 143, HDL 37, LDL 69.  The patient denies any symptoms of chest pain, palpitations, shortness of breath, lightheadedness, dizziness, leg swelling, orthopnea, PND, and syncope.    Review of Systems: As per "subjective", otherwise negative.  Allergies  Allergen Reactions  . Niacin   . Penicillins     Current Outpatient Prescriptions  Medication Sig Dispense Refill  . amLODipine (NORVASC) 5 MG tablet Take 1 tablet (5 mg total) by mouth daily. 90 tablet 3  . aspirin 81 MG tablet Take 81 mg by mouth daily.    . calcium-vitamin D 250-100 MG-UNIT per tablet Take 1 tablet by mouth 2 (two) times daily.      . Coenzyme Q10 (CO Q 10) 100 MG CAPS Take 100 mg by mouth daily.      Marland Kitchen glucosamine-chondroitin 500-400 MG tablet Take 1 tablet by mouth daily.     . hydrochlorothiazide (HYDRODIURIL) 25 MG tablet TAKE 1 TABLET EVERY DAY 90 tablet 3  . Magnesium 400 MG CAPS Take 400 mg by mouth daily. 90 capsule 3  . metoprolol (LOPRESSOR) 100 MG tablet TAKE 1 TABLET (100 MG TOTAL) BY MOUTH 2 (TWO) TIMES DAILY. 180 tablet 3  . Multiple Vitamins-Minerals (MULTIVITAMIN WITH MINERALS) tablet Take 1 tablet by mouth daily.      . nitroGLYCERIN (NITROSTAT) 0.4 MG SL tablet Place 1 tablet (0.4 mg total) under the tongue every 5 (five) minutes as needed. 25 tablet 3  . POTASSIUM BICARBONATE PO Take by mouth.    . potassium chloride SA (K-DUR,KLOR-CON) 20 MEQ tablet Take 2 tablets (40 mEq total) by mouth daily. 180 tablet 3  . vitamin C (ASCORBIC ACID) 500 MG tablet Take 500 mg by mouth daily.      . vitamin E 400 UNIT capsule Take 400 Units by mouth daily.      Marland Kitchen losartan (COZAAR) 100 MG tablet Take 1 tablet (100 mg total) by mouth daily. 90 tablet 3  .  rosuvastatin (CRESTOR) 10 MG tablet Take 1 tablet (10 mg total) by mouth daily. 90 tablet 3   No current facility-administered medications for this visit.     Past Medical History:  Diagnosis Date  . 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 Coronary artery disease    a. s/p CABG 1997.  Marland Kitchen Heart attack ([place])   . Hyperlipidemia   . Hypertension   . Obesity (BMI 30-39.9)   . OSA on CPAP March 2016   Compliant  . RBBB with left anterior fascicular block     Past Surgical History:  Procedure Laterality Date  . COLONOSCOPY N/A 08/01/2013   Procedure: COLONOSCOPY;  Surgeon: Jamesetta So, MD;  [facility];  Service: Gastroenterology;  Laterality: N/A;  . CORONARY ARTERY BYPASS GRAFT     x4  . Lens Bilateral April 2016   Replacment   . LOWER LEG SOFT TISSUE TUMOR EXCISION      Social History   Social History  . Marital status: Married    Spouse name: N/A  . Number of children: 3  . Years of education: College   Occupational History  . Dance movement psychotherapist    Social History Main Topics  . Smoking status: Former Smoker    Packs/day:  1.50    Years: 32.00    Types: Cigarettes    Quit date: 08/17/1996  . Smokeless tobacco: Not on file  . Alcohol use 0.0 oz/week     Comment: Occasional   . Drug use: No  . Sexual activity: Not on file   Other Topics Concern  . Not on file   Social History Narrative   Married with 3 children   No regular exercise   Drinks about 48oz of coke a day      Vitals:   05/14/16 1128  BP: (!) 148/78  Pulse: (!) 53  SpO2: 95%  Weight: 261 lb (118.4 kg)  Height: 6\' 1"  (1.854 m)    PHYSICAL EXAM General: NAD HEENT: Normal. Neck: No JVD, no thyromegaly. Lungs: Clear to auscultation bilaterally with normal respiratory effort. CV: Nondisplaced PMI.  Regular rate and rhythm, normal S1/S2, no S3/S4, no murmur. No pretibial or periankle edema.  No carotid bruit.   Abdomen: Soft, nontender, no distention.  Neurologic: Alert and  oriented.  Psych: Normal affect. Skin: Normal. Musculoskeletal: No gross deformities.    ECG: Most recent ECG reviewed.      ASSESSMENT AND PLAN: 1. CAD/CABG: Symptomatically stable. Continue ASA, beta blocker, and Crestor.    2. Essential HTN: Elevated. Increase amlodipine to 10 mg daily.  3. Hyperlipidemia: Lipids reviewed above. Continue Crestor 10 mg daily.  Dispo: f/u 1 year.   Kate Sable, M.D., F.A.C.C.

## 2016-05-14 NOTE — Patient Instructions (Addendum)
Your physician wants you to follow-up in: 1 year You will receive a reminder letter in the mail two months in advance. If you don't receive a letter, please call our office to schedule the follow-up appointment.   INCREASE Norvasc to 10 mg daily  If you need a refill on your cardiac medications before your next appointment, please call your pharmacy.    Thank you for choosing [facility] !

## 2016-06-02 DIAGNOSIS — C499 Malignant neoplasm of connective and soft tissue, unspecified: Secondary | ICD-10-CM | POA: Diagnosis not present

## 2016-06-02 DIAGNOSIS — C4921 Malignant neoplasm of connective and soft tissue of right lower limb, including hip: Secondary | ICD-10-CM | POA: Diagnosis not present

## 2016-06-02 DIAGNOSIS — Z9889 Other specified postprocedural states: Secondary | ICD-10-CM | POA: Diagnosis not present

## 2016-06-02 DIAGNOSIS — Z483 Aftercare following surgery for neoplasm: Secondary | ICD-10-CM | POA: Diagnosis not present

## 2016-06-10 ENCOUNTER — Other Ambulatory Visit: Payer: Self-pay | Admitting: Adult Health

## 2016-06-12 ENCOUNTER — Other Ambulatory Visit: Payer: Self-pay | Admitting: Cardiovascular Disease

## 2016-06-12 ENCOUNTER — Other Ambulatory Visit: Payer: Self-pay | Admitting: Adult Health

## 2016-06-12 NOTE — Telephone Encounter (Signed)
Pt has 3 rx's filled at apt last month,ntg 2 days ago,he called in wrong rx numbers,says he will go to pharmacy now

## 2016-06-12 NOTE — Telephone Encounter (Signed)
Patient states that he called in refills on HCTZ and NTG to [facility] on 06/10/16. Please send these in. / tg

## 2016-07-07 DIAGNOSIS — D225 Melanocytic nevi of trunk: Secondary | ICD-10-CM | POA: Diagnosis not present

## 2016-07-07 DIAGNOSIS — D1801 Hemangioma of skin and subcutaneous tissue: Secondary | ICD-10-CM | POA: Diagnosis not present

## 2016-07-07 DIAGNOSIS — L570 Actinic keratosis: Secondary | ICD-10-CM | POA: Diagnosis not present

## 2016-07-07 DIAGNOSIS — D2271 Melanocytic nevi of right lower limb, including hip: Secondary | ICD-10-CM | POA: Diagnosis not present

## 2016-07-07 DIAGNOSIS — Z85828 Personal history of other malignant neoplasm of skin: Secondary | ICD-10-CM | POA: Diagnosis not present

## 2016-07-07 DIAGNOSIS — D2272 Melanocytic nevi of left lower limb, including hip: Secondary | ICD-10-CM | POA: Diagnosis not present

## 2016-07-07 DIAGNOSIS — L821 Other seborrheic keratosis: Secondary | ICD-10-CM | POA: Diagnosis not present

## 2016-08-15 ENCOUNTER — Other Ambulatory Visit: Payer: Self-pay | Admitting: Adult Health

## 2016-09-10 ENCOUNTER — Encounter: Payer: Self-pay | Admitting: Neurology

## 2016-09-10 ENCOUNTER — Ambulatory Visit (INDEPENDENT_AMBULATORY_CARE_PROVIDER_SITE_OTHER): Payer: Medicare Other | Admitting: Neurology

## 2016-09-10 VITALS — BP 128/64 | HR 62 | Resp 18 | Ht 73.0 in | Wt 265.0 lb

## 2016-09-10 DIAGNOSIS — G4733 Obstructive sleep apnea (adult) (pediatric): Secondary | ICD-10-CM

## 2016-09-10 DIAGNOSIS — R634 Abnormal weight loss: Secondary | ICD-10-CM | POA: Diagnosis not present

## 2016-09-10 NOTE — Patient Instructions (Addendum)
Let's try to reduce your BiPAP pressure to 14/9 cm since you have been able to lose weight.  Keep up the good work! I will see you back in 12 months for sleep apnea check up.

## 2016-09-10 NOTE — Progress Notes (Signed)
Subjective:    Patient ID: Joshua Gentry is a 71 y.o. male.  HPI     Interim history:  [name] is a very pleasant 71 year old right-handed gentleman with an underlying medical history of sarcoma, obesity, coronary artery disease, s/p MI in 88, status post four-vessel CABG, and followed by Dr. Bronson Ing, hypertension, hyperlipidemia, and former smoking, who presents for follow-up consultation of his obstructive sleep apnea, on treatment with BiPAP. The patient is accompanied by his wife today, who provides additional information. I last saw him on 09/12/2015 at which time he reported doing well, he was using a fullface mask. He was fully compliant with BiPAP therapy. He had lost some weight. Since his sleep study from April 2016 he had lost about 22 pounds. He was working towards an overall weight goal of 240 pounds. He has a history of sarcoma affecting his right leg and he needed further surgery. He is followed for this at [facility].  Today, 09/10/2016: I reviewed his BiPAP compliance data from 08/10/2016 through 09/13/2016 which is a total of 30 days, during which time he used his BiPAP every night with percent used days greater than 4 hours at 100%, indicating superb compliance with an average usage of 9 hours and 3 minutes, residual AHI 0.5 per hour, leak low for the 95th percentile at 9.3 L/m on a pressure of 15/10 cm.  Today, 09/10/2016: He reports doing well, had surgery on 10/11/15 to R leg and took a little longer to heal, but eventually did well. Goes to [facility] for FU and care of his sarcoma. He had an MRI right thigh on 06/02/2016 at [facility], which I reviewed: FINDINGS:      Interval surgical changes of resection of the medial right thigh fat-containing mass in the region of the adductor longus muscle without evidence of residual or recurrent tumor.      No new/enlarging lymph nodes in the partially imaged right inguinal region.      No  abnormal marrow signal. No acute fracture or malalignment. Mild muscle atrophy at the distal semimembranosus myotendinous junction, unchanged compared to MRI examination of 2006.      He feels well, he exercises, particularly utilizing his treadmill at home about 25 minutes twice daily. He is drinking more water, less sodas. He has lost weight since his last visit, he is working on weight loss and would not mind trying to reduce his BiPAP pressure. He does need new supplies as well. The patient's allergies, current medications, family history, past medical history, past social history, past surgical history and problem list were reviewed and updated as appropriate. He saw his cardiologist in September 2017 and I reviewed the note. He was advised to increase his amlodipine to 10 mg daily, and a one-year follow-up was suggested.   Previously:  I saw him on 03/13/2015, at which time he reported feeling better. He felt his sleep quality had improved and he felt better rested. He was compliant with treatment. He felt that his RLS symptoms were under control. He was seen in the interim by our nurse practitioner, Ward Givens on 05/21/2015 and I reviewed the note.    I reviewed his BiPAP compliance data from 08/12/2015 through 09/10/2015 which is a total of 30 days during which time he used his machine every night with percent used days greater than 4 hours at 100%, indicating superb compliance with an average usage of 8 hours and 35 minutes, residual AHI low at 1.1  per hour, leak acceptable with the 95th percentile at 10.3 L/m on a pressure of 16/11 cm.   I first met him on 11/29/2014 at the request of his primary care physician, at which time the patient reported snoring and excessive daytime somnolence as well as witnessed apneas per wife. I asked him to return for sleep study. He had a split-night sleep study on 12/06/2014 and went over his test results with him in detail today. Baseline sleep efficiency  was 62.9% with a latency to sleep of 50.5 minutes and wake after sleep onset of 32.5 minutes with mild sleep fragmentation noted. He had an elevated arousal index. He had an elevated percentage of stage II sleep, and near absence of REM sleep with a REM latency of 114.5 minutes. He had no significant PLMS, he had occasional PVCs and PACs on single-lead EKG. He had moderate to loud snoring. Total AHI was highly elevated at 75.3 per hour, baseline oxygen saturation of only 89%, nadir of 81%. He was then titrated on positive airway pressure. Sleep efficiency was 42.5%. He had a significant increase in slow-wave sleep and REM sleep post treatment. His average oxygen saturation was 92%, nadir was 89%. Snoring was eliminated, no significant PLMS were seen. He was titrated on CPAP first from a pressure of 5 cm to 11 cm. He was then switched to BiPAP for comfort and improvement of his oxygen saturations. He was further titrated from 15/11 cm to 16/11 cm. On the final pressure his AHI was 2.9 per hour. Based on the test results are prescribed BiPAP therapy for home use.   I reviewed his BiPAP compliance data from 02/10/2015 2 03/11/2015 which is a total of 30 days during which time he used his machine every night with percent used days greater than 4 hours at 97%, indicating excellent compliance with an average usage of 7 hours and 41 minutes, residual AHI low at 0.5 per hour, leak low for the 95th percentile at 0.3 L/m on a pressure of 16/11 cm with easy breathe on.   He reports snoring, excessive daytime somnolence and witnessed apneas per wife. He complains of nonrestorative sleep and sleep disruption. His sleep is not as good since his MI in 63. While he goes to sleep okay he has sleep disruption and often has a very difficult time going back to sleep once he is up in the middle of the night. He has Sx of RLS, including wiggling his feet, worse at night. He has nocturia x 1, occasional morning HAs.   His typical  bedtime is reported to be around 10 PM and wake time varies.he has daytime somnolence. His Epworth sleepiness score is 14 out of 24. His fatigue scores 43. 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 He also likes to play golf in his spare time. 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 He quit smoking when he had his heart attack. He rarely drinks alcohol and drinks caffeine in the form of sodas approximately 2 times per day. He does not endorse any parasomnias. He has never fallen asleep at the wheel.    He has never had a sleep study. His mother was diagnosed in her later years with obstructive sleep apnea and tried CPAP but could not tolerate it. She was in her 72s at the time.   He has 3 grown children, all in the area.   He has occasional exertional shortness of breath  but no chest pains.  His Past Medical History Is Significant For: Past Medical History:  Diagnosis Date  . 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 Coronary artery disease    a. s/p CABG 1997.  Marland Kitchen Heart attack   . Hyperlipidemia   . Hypertension   . Obesity (BMI 30-39.9)   . OSA on CPAP March 2016   Compliant  . RBBB with left anterior fascicular block     His Past Surgical History Is Significant For: Past Surgical History:  Procedure Laterality Date  . COLONOSCOPY N/A 08/01/2013   Procedure: COLONOSCOPY;  Surgeon: Jamesetta So, MD;  [facility];  Service: Gastroenterology;  Laterality: N/A;  . CORONARY ARTERY BYPASS GRAFT     x4  . Lens Bilateral April 2016   Replacment   . LOWER LEG SOFT TISSUE TUMOR EXCISION      His Family History Is Significant For: Family History  Problem Relation Age of Onset  . Hypertension Mother   . Diabetes Mother   . Heart attack Father   . Diabetes Sister   . Diabetes Sister     His Social History Is Significant For: Social History   Social History  . Marital status: Married    Spouse name: N/A  . Number  of children: 3  . Years of education: College   Occupational History  . Dance movement psychotherapist    Social History Main Topics  . Smoking status: Former Smoker    Packs/day: 1.50    Years: 32.00    Types: Cigarettes    Quit date: 08/17/1996  . Smokeless tobacco: Never Used  . Alcohol use 0.0 oz/week     Comment: Occasional   . Drug use: No  . Sexual activity: Not Asked   Other Topics Concern  . None   Social History Narrative   Married with 3 children   No regular exercise   Drinks about 48oz of coke a day     His Allergies Are:  Allergies  Allergen Reactions  . Niacin   . Penicillins   :   His Current Medications Are:  Outpatient Encounter Prescriptions as of 09/10/2016  Medication Sig  . aspirin 81 MG tablet Take 81 mg by mouth daily.  . calcium-vitamin D 250-100 MG-UNIT per tablet Take 1 tablet by mouth 2 (two) times daily.    . cholecalciferol (VITAMIN D) 1000 units tablet Take 1,000 Units by mouth daily.  . Coenzyme Q10 (CO Q 10) 100 MG CAPS Take 100 mg by mouth daily.    Marland Kitchen glucosamine-chondroitin 500-400 MG tablet Take 1 tablet by mouth daily.   . hydrochlorothiazide (HYDRODIURIL) 25 MG tablet TAKE 1 TABLET EVERY DAY  . losartan (COZAAR) 100 MG tablet Take 1 tablet (100 mg total) by mouth daily.  . Magnesium 400 MG CAPS Take 400 mg by mouth daily.  . metoprolol (LOPRESSOR) 100 MG tablet TAKE 1 TABLET (100 MG TOTAL) BY MOUTH 2 (TWO) TIMES DAILY.  . Multiple Vitamins-Minerals (MULTIVITAMIN WITH MINERALS) tablet Take 1 tablet by mouth daily.    Marland Kitchen NITROSTAT 0.4 MG SL tablet DISSOLVE ONE TABLET UNDER THE TONGUE EVERY 5 MINUTES AS NEEDED FOR CHEST PAIN.  DO NOT EXCEED A TOTAL OF 3 DOSES IN 15 MINUTES  . potassium chloride SA (K-DUR,KLOR-CON) 20 MEQ tablet Take 2 tablets (40 mEq total) by mouth daily.  . rosuvastatin (CRESTOR) 10 MG tablet TAKE ONE TABLET BY MOUTH ONCE DAILY  . vitamin C (ASCORBIC ACID) 500  MG tablet Take 500 mg by mouth daily.    Marland Kitchen amLODipine (NORVASC) 10  MG tablet Take 1 tablet (10 mg total) by mouth daily.  . [DISCONTINUED] NITROSTAT 0.4 MG SL tablet DISSOLVE ONE TABLET UNDER THE TONGUE EVERY 5 MINUTES AS NEEDED FOR CHEST PAIN.  DO NOT EXCEED A TOTAL OF 3 DOSES IN 15 MINUTES  . [DISCONTINUED] POTASSIUM BICARBONATE PO Take by mouth.  . [DISCONTINUED] vitamin E 400 UNIT capsule Take 400 Units by mouth daily.     No facility-administered encounter medications on file as of 09/10/2016.   :  Review of Systems:  Out of a complete 14 point review of systems, all are reviewed and negative with the exception of these symptoms as listed below:  Review of Systems  Neurological:       Patient is here for f/u. No new concerns. Needs new supplies.     Objective:  Neurologic Exam  Physical Exam Physical Examination:   Vitals:   09/10/16 1307  BP: 128/64  Pulse: 62  Resp: 18   General Examination: The patient is a very pleasant 71 y.o. male in no acute distress. He appears well-developed and well-nourished and well groomed. He is in good spirits today, has lost weight.   HEENT: Normocephalic, atraumatic, pupils are equal, round and reactive to light and accommodation. Funduscopic exam is normal with sharp disc margins noted. He is s/p b/l cataract repairs. He has mild redness around the nose. Extraocular tracking is good without limitation to gaze excursion or nystagmus noted. Normal smooth pursuit is noted. Hearing is grossly intact. Face is symmetric with normal facial animation and normal facial sensation. Speech is clear with no dysarthria noted. There is no hypophonia. There is no lip, neck/head, jaw or voice tremor. Neck is supple with full range of passive and active motion. There are no carotid bruits on auscultation. Oropharynx exam reveals: mild mouth dryness, good dental hygiene and moderate airway crowding, due to larger uvula, redundant soft palate, wider tongue. Mallampati is class II. Tongue protrudes centrally and palate elevates  symmetrically. Tonsils are absent. He has a Mild overbite. Nose is mildly deformed; he has a deviated septum. He said he fractured his nose in the past.   Chest: Clear to auscultation without wheezing, rhonchi or crackles noted.  Heart: S1+S2+0, regular and normal without murmurs, rubs or gallops noted.   Abdomen: Soft, non-tender and non-distended with normal bowel sounds appreciated on auscultation.  Extremities: There is no pitting edema in the distal lower extremities bilaterally. Pedal pulses are intact.  Skin: Warm and dry without trophic changes noted.  he has an irritated area on his nose. He follows with dermatology at [facility], due to his sarcoma history. There are no varicose veins. He has dry scaly patches on his scalp. He has partial baldness.  Musculoskeletal: exam reveals no obvious joint deformities, tenderness or joint swelling or erythema.   Neurologically:  Mental status: The patient is awake, alert and oriented in all 4 spheres. His immediate and remote memory, attention, language skills and fund of knowledge are appropriate. There is no evidence of aphasia, agnosia, apraxia or anomia. Speech is clear with normal prosody and enunciation. Thought process is linear. Mood is normal and affect is normal.  Cranial nerves II - XII are as described above under HEENT exam. In addition: shoulder shrug is normal with equal shoulder height noted. Motor exam: Normal bulk, strength and tone is noted. There is no drift, tremor or rebound. Romberg is negative. Reflexes  are 2+ throughout. Fine motor skills and coordination: intact with normal finger taps, normal hand movements, normal rapid alternating patting, normal foot taps and normal foot agility.  Cerebellar testing: No dysmetria or intention tremor on finger to nose testing. Heel to shin is unremarkable bilaterally. There is no truncal or gait ataxia.  Sensory exam: intact to light touch in the upper and lower extremities.  Gait, station  and balance: He stands easily. No veering to one side is noted. No leaning to one side is noted. Posture is age-appropriate and stance is narrow based. Gait shows normal stride length and normal pace. No problems turning are noted. Tandem walk is slightly difficult for him, not new, stable from last time.    Assessment and Plan:   In summary, DEMETRUIS DEPAUL is a very pleasant 71 year old male with an underlying medical history of obesity, R thigh sarcoma in 2006 with additional surgery needed in 2/17, coronary artery disease, s/p MI in 98, status post four-vessel CABG, and followed by Dr. Bronson Ing, hypertension, hyperlipidemia, and former smoking, who presents for follow-up consultation of his severe obstructive sleep apnea, well treated with BiPAP at 15/10 cm via full face mask. We briefly reviewed his split-night sleep study results from 12/06/2014 again today and I reviewed his most recent compliance data with him. Last year we reduced his pressure from 16/11 cm to 15/10 cm. He continues to work on weight loss and compared to his sleep study from April 2016 he has lost about 22 pounds. He is commended for this. He is also fully compliant with CPAP therapy and has ongoing good results, has changed his supplies and mask as well as hose and humidifier container on a regular basis. He needs a prescription for updated supplies. Furthermore, I suggested we could reduce his pressure 214/9 cm at this time since he has been able to achieve further weight loss and his AHI is low. He is in agreement. He is using a fullface mask successfully without major leakage.  I reviewed his right eye MRI report and his cardiology visit today. From my end of things he has been doing rather well, blood pressure looks excellent. I would like to see him back in one year, sooner if needed. I answered all their questions today and the patient and his wife were in agreement. I spent 25 minutes in total face-to-face time with the  patient, more than 50% of which was spent in counseling and coordination of care, reviewing test results, reviewing medication and discussing or reviewing the diagnosis of OSA, its prognosis and treatment options.

## 2016-11-25 DIAGNOSIS — E782 Mixed hyperlipidemia: Secondary | ICD-10-CM | POA: Diagnosis not present

## 2016-11-25 DIAGNOSIS — Z0001 Encounter for general adult medical examination with abnormal findings: Secondary | ICD-10-CM | POA: Diagnosis not present

## 2016-11-25 DIAGNOSIS — R7309 Other abnormal glucose: Secondary | ICD-10-CM | POA: Diagnosis not present

## 2016-11-25 DIAGNOSIS — Z1389 Encounter for screening for other disorder: Secondary | ICD-10-CM | POA: Diagnosis not present

## 2016-11-25 DIAGNOSIS — Z6834 Body mass index (BMI) 34.0-34.9, adult: Secondary | ICD-10-CM | POA: Diagnosis not present

## 2016-11-25 DIAGNOSIS — I10 Essential (primary) hypertension: Secondary | ICD-10-CM | POA: Diagnosis not present

## 2016-11-25 DIAGNOSIS — I2510 Atherosclerotic heart disease of native coronary artery without angina pectoris: Secondary | ICD-10-CM | POA: Diagnosis not present

## 2016-12-30 DIAGNOSIS — L570 Actinic keratosis: Secondary | ICD-10-CM | POA: Diagnosis not present

## 2016-12-30 DIAGNOSIS — L821 Other seborrheic keratosis: Secondary | ICD-10-CM | POA: Diagnosis not present

## 2016-12-30 DIAGNOSIS — D225 Melanocytic nevi of trunk: Secondary | ICD-10-CM | POA: Diagnosis not present

## 2016-12-30 DIAGNOSIS — C44719 Basal cell carcinoma of skin of left lower limb, including hip: Secondary | ICD-10-CM | POA: Diagnosis not present

## 2016-12-30 DIAGNOSIS — D1801 Hemangioma of skin and subcutaneous tissue: Secondary | ICD-10-CM | POA: Diagnosis not present

## 2016-12-30 DIAGNOSIS — Z85828 Personal history of other malignant neoplasm of skin: Secondary | ICD-10-CM | POA: Diagnosis not present

## 2016-12-30 DIAGNOSIS — L814 Other melanin hyperpigmentation: Secondary | ICD-10-CM | POA: Diagnosis not present

## 2017-01-04 DIAGNOSIS — H524 Presbyopia: Secondary | ICD-10-CM | POA: Diagnosis not present

## 2017-01-04 DIAGNOSIS — D3131 Benign neoplasm of right choroid: Secondary | ICD-10-CM | POA: Diagnosis not present

## 2017-01-04 DIAGNOSIS — H5203 Hypermetropia, bilateral: Secondary | ICD-10-CM | POA: Diagnosis not present

## 2017-01-04 DIAGNOSIS — H52223 Regular astigmatism, bilateral: Secondary | ICD-10-CM | POA: Diagnosis not present

## 2017-05-19 ENCOUNTER — Other Ambulatory Visit: Payer: Self-pay | Admitting: Cardiovascular Disease

## 2017-05-26 ENCOUNTER — Encounter: Payer: Self-pay | Admitting: Cardiology

## 2017-05-31 ENCOUNTER — Ambulatory Visit: Payer: Medicare Other | Admitting: Cardiology

## 2017-06-09 ENCOUNTER — Other Ambulatory Visit: Payer: Self-pay | Admitting: Cardiovascular Disease

## 2017-06-10 DIAGNOSIS — Z23 Encounter for immunization: Secondary | ICD-10-CM | POA: Diagnosis not present

## 2017-06-21 ENCOUNTER — Encounter: Payer: Self-pay | Admitting: Cardiovascular Disease

## 2017-06-21 ENCOUNTER — Ambulatory Visit (INDEPENDENT_AMBULATORY_CARE_PROVIDER_SITE_OTHER): Payer: Medicare Other | Admitting: Cardiovascular Disease

## 2017-06-21 VITALS — BP 150/84 | HR 52 | Ht 73.0 in | Wt 266.0 lb

## 2017-06-21 DIAGNOSIS — E785 Hyperlipidemia, unspecified: Secondary | ICD-10-CM | POA: Diagnosis not present

## 2017-06-21 DIAGNOSIS — I25708 Atherosclerosis of coronary artery bypass graft(s), unspecified, with other forms of angina pectoris: Secondary | ICD-10-CM

## 2017-06-21 DIAGNOSIS — I10 Essential (primary) hypertension: Secondary | ICD-10-CM | POA: Diagnosis not present

## 2017-06-21 MED ORDER — SPIRONOLACTONE 25 MG PO TABS: 25.0000 mg | ORAL_TABLET | Freq: Every day | ORAL | 3 refills | Status: DC

## 2017-06-21 NOTE — Progress Notes (Signed)
SUBJECTIVE: The patient presents for follow-up of coronary artery disease.  He underwent CABG in 1997.  ECG performed in the office today which I personally interpreted demonstrated sinus bradycardia with right bundle branch block and left anterior fascicular block, 50 bpm.  He uses CPAP on a nightly basis.  He thinks his blood pressure was 140/70 at his PCPs office when last checked.  It is 150/84 today.  He recently returned from [facility] after a week and spent with his wife.  Prior to that, he went on a 5-day golfing outing with his friends.  He has not been hospitalized this year.    Review of Systems: As per "subjective", otherwise negative.  Allergies  Allergen Reactions  . Niacin   . Penicillins     Current Outpatient Medications  Medication Sig Dispense Refill  . amLODipine (NORVASC) 10 MG tablet TAKE ONE TABLET BY MOUTH ONCE DAILY 90 tablet 1  . aspirin 81 MG tablet Take 81 mg by mouth daily.    . calcium-vitamin D 250-100 MG-UNIT per tablet Take 1 tablet by mouth 2 (two) times daily.      . cholecalciferol (VITAMIN D) 1000 units tablet Take 1,000 Units by mouth daily.    . Coenzyme Q10 (CO Q 10) 100 MG CAPS Take 100 mg by mouth daily.      Marland Kitchen glucosamine-chondroitin 500-400 MG tablet Take 1 tablet by mouth daily.     . hydrochlorothiazide (HYDRODIURIL) 25 MG tablet TAKE 1 TABLET EVERY DAY 90 tablet 3  . losartan (COZAAR) 100 MG tablet TAKE ONE TABLET BY MOUTH ONCE DAILY 90 tablet 3  . Magnesium 400 MG CAPS Take 400 mg by mouth daily. 90 capsule 3  . metoprolol (LOPRESSOR) 100 MG tablet TAKE 1 TABLET (100 MG TOTAL) BY MOUTH 2 (TWO) TIMES DAILY. 180 tablet 3  . Multiple Vitamins-Minerals (MULTIVITAMIN WITH MINERALS) tablet Take 1 tablet by mouth daily.      Marland Kitchen NITROSTAT 0.4 MG SL tablet DISSOLVE ONE TABLET UNDER THE TONGUE EVERY 5 MINUTES AS NEEDED FOR CHEST PAIN.  DO NOT EXCEED A TOTAL OF 3 DOSES IN 15 MINUTES 25 tablet 3  . potassium chloride SA  (K-DUR,KLOR-CON) 20 MEQ tablet Take 2 tablets (40 mEq total) by mouth daily. 180 tablet 3  . rosuvastatin (CRESTOR) 10 MG tablet TAKE ONE TABLET BY MOUTH ONCE DAILY 90 tablet 3  . vitamin C (ASCORBIC ACID) 500 MG tablet Take 500 mg by mouth daily.       No current facility-administered medications for this visit.     Past Medical History:  Diagnosis Date  . 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 Coronary artery disease    a. s/p CABG 1997.  Marland Kitchen Heart attack ([place])   . Hyperlipidemia   . Hypertension   . Obesity (BMI 30-39.9)   . OSA on CPAP March 2016   Compliant  . RBBB with left anterior fascicular block     Past Surgical History:  Procedure Laterality Date  . CORONARY ARTERY BYPASS GRAFT     x4  . Lens Bilateral April 2016   Replacment   . LOWER LEG SOFT TISSUE TUMOR EXCISION      Social History   Socioeconomic History  . Marital status: Married    Spouse name: Not on file  . Number of children: 3  . Years of education: College  . Highest education level: Not on file  Social Needs  . Emergency planning/management officer  strain: Not on file  . Food insecurity - worry: Not on file  . Food insecurity - inability: Not on file  . Transportation needs - medical: Not on file  . Transportation needs - non-medical: Not on file  Occupational History  . Occupation: Dance movement psychotherapist  Tobacco Use  . Smoking status: Former Smoker    Packs/day: 1.50    Years: 32.00    Pack years: 48.00    Types: Cigarettes    Last attempt to quit: 08/17/1996    Years since quitting: 20.8  . Smokeless tobacco: Never Used  Substance and Sexual Activity  . Alcohol use: Yes    Alcohol/week: 0.0 oz    Comment: Occasional   . Drug use: No  . Sexual activity: Not on file  Other Topics Concern  . Not on file  Social History Narrative   Married with 3 children   No regular exercise   Drinks about 48oz of coke a day      Vitals:   06/21/17 1321  BP: (!) 150/84  Pulse: (!) 52  SpO2: 93%  Weight:  266 lb (120.7 kg)  Height: 6\' 1"  (1.854 m)    Wt Readings from Last 3 Encounters:  06/21/17 266 lb (120.7 kg)  09/10/16 265 lb (120.2 kg)  05/14/16 261 lb (118.4 kg)     PHYSICAL EXAM General: NAD HEENT: Normal. Neck: No JVD, no thyromegaly. Lungs: Clear to auscultation bilaterally with normal respiratory effort. CV: Bradycardic, regular rhythm, normal S1/S2, no S3/S4, no murmur. No pretibial or periankle edema.  No carotid bruit.   Abdomen: Soft, nontender, no distention.  Neurologic: Alert and oriented.  Psych: Normal affect. Skin: Normal. Musculoskeletal: No gross deformities.    ECG: Most recent ECG reviewed.   Labs: Lab Results  Component Value Date/Time   K 3.7 10/31/2015 07:10 AM   BUN 18 10/31/2015 07:10 AM   CREATININE 0.89 10/31/2015 07:10 AM   ALT 18 10/07/2015 07:33 AM   TSH 1.69 10/07/2015 07:33 AM   HGB 15.9 10/07/2015 07:33 AM     Lipids: Lab Results  Component Value Date/Time   LDLCALC 69 10/07/2015 07:33 AM   CHOL 135 10/07/2015 07:33 AM   TRIG 143 10/07/2015 07:33 AM   HDL 37 (L) 10/07/2015 07:33 AM       ASSESSMENT AND PLAN:  1. CAD/CABG: Symptomatically stable. Continue ASA, beta blocker, and Crestor. CABG was in 1997 and last stress test was in 2009.  I may obtain a screening surveillance stress test at his next visit in 1 year.  2. Essential HTN: Elevated.  He is already taking hydrochlorothiazide 25 mg daily, losartan 100 mg daily, and metoprolol 100 mg twice daily.  Renal function was normal when last checked.  I will start spironolactone 25 mg daily and check a basic metabolic panel.  3. Hyperlipidemia: I will obtain a copy of lipids from PCP.  Continue Crestor.     Disposition: Follow up 1 year.   Kate Sable, M.D., F.A.C.C.

## 2017-06-21 NOTE — Patient Instructions (Signed)
Medication Instructions:   Begin Spironolactone 25mg  daily.  Continue all other medications.    Labwork:  BMET - order given today.   Due in 1 week.  Office will contact with results via phone or letter.    Testing/Procedures: none  Follow-Up: Your physician wants you to follow up in:  1 year.  You will receive a reminder letter in the mail one-two months in advance.  If you don't receive a letter, please call our office to schedule the follow up appointment   Any Other Special Instructions Will Be Listed Below (If Applicable).  If you need a refill on your cardiac medications before your next appointment, please call your pharmacy.

## 2017-06-22 ENCOUNTER — Encounter: Payer: Self-pay | Admitting: *Deleted

## 2017-06-30 DIAGNOSIS — L821 Other seborrheic keratosis: Secondary | ICD-10-CM | POA: Diagnosis not present

## 2017-06-30 DIAGNOSIS — L570 Actinic keratosis: Secondary | ICD-10-CM | POA: Diagnosis not present

## 2017-06-30 DIAGNOSIS — D225 Melanocytic nevi of trunk: Secondary | ICD-10-CM | POA: Diagnosis not present

## 2017-06-30 DIAGNOSIS — D1801 Hemangioma of skin and subcutaneous tissue: Secondary | ICD-10-CM | POA: Diagnosis not present

## 2017-06-30 DIAGNOSIS — D2262 Melanocytic nevi of left upper limb, including shoulder: Secondary | ICD-10-CM | POA: Diagnosis not present

## 2017-06-30 DIAGNOSIS — D2261 Melanocytic nevi of right upper limb, including shoulder: Secondary | ICD-10-CM | POA: Diagnosis not present

## 2017-06-30 DIAGNOSIS — L814 Other melanin hyperpigmentation: Secondary | ICD-10-CM | POA: Diagnosis not present

## 2017-06-30 DIAGNOSIS — Z85828 Personal history of other malignant neoplasm of skin: Secondary | ICD-10-CM | POA: Diagnosis not present

## 2017-07-01 DIAGNOSIS — I10 Essential (primary) hypertension: Secondary | ICD-10-CM | POA: Diagnosis not present

## 2017-07-01 LAB — BASIC METABOLIC PANEL
BUN: 18 mg/dL (ref 7–25)
CO2: 30 mmol/L (ref 20–32)
CREATININE: 0.88 mg/dL (ref 0.70–1.25)
Calcium: 9.3 mg/dL (ref 8.6–10.3)
Chloride: 104 mmol/L (ref 98–110)
GLUCOSE: 109 mg/dL — AB (ref 65–99)
Potassium: 4.3 mmol/L (ref 3.5–5.3)
Sodium: 141 mmol/L (ref 135–146)

## 2017-07-13 DIAGNOSIS — C499 Malignant neoplasm of connective and soft tissue, unspecified: Secondary | ICD-10-CM | POA: Diagnosis not present

## 2017-07-13 DIAGNOSIS — Z483 Aftercare following surgery for neoplasm: Secondary | ICD-10-CM | POA: Diagnosis not present

## 2017-07-13 DIAGNOSIS — C4921 Malignant neoplasm of connective and soft tissue of right lower limb, including hip: Secondary | ICD-10-CM | POA: Diagnosis not present

## 2017-07-20 ENCOUNTER — Other Ambulatory Visit: Payer: Self-pay | Admitting: Cardiovascular Disease

## 2017-07-29 DIAGNOSIS — E669 Obesity, unspecified: Secondary | ICD-10-CM | POA: Diagnosis not present

## 2017-07-29 DIAGNOSIS — Z6834 Body mass index (BMI) 34.0-34.9, adult: Secondary | ICD-10-CM | POA: Diagnosis not present

## 2017-07-29 DIAGNOSIS — J329 Chronic sinusitis, unspecified: Secondary | ICD-10-CM | POA: Diagnosis not present

## 2017-07-29 DIAGNOSIS — E6609 Other obesity due to excess calories: Secondary | ICD-10-CM | POA: Diagnosis not present

## 2017-07-29 DIAGNOSIS — I2510 Atherosclerotic heart disease of native coronary artery without angina pectoris: Secondary | ICD-10-CM | POA: Diagnosis not present

## 2017-08-06 ENCOUNTER — Other Ambulatory Visit: Payer: Self-pay | Admitting: Cardiovascular Disease

## 2017-09-14 ENCOUNTER — Encounter: Payer: Self-pay | Admitting: Neurology

## 2017-09-14 ENCOUNTER — Ambulatory Visit (INDEPENDENT_AMBULATORY_CARE_PROVIDER_SITE_OTHER): Payer: Medicare Other | Admitting: Neurology

## 2017-09-14 ENCOUNTER — Encounter (INDEPENDENT_AMBULATORY_CARE_PROVIDER_SITE_OTHER): Payer: Self-pay

## 2017-09-14 VITALS — BP 122/73 | HR 63 | Ht 73.0 in | Wt 259.0 lb

## 2017-09-14 DIAGNOSIS — G4733 Obstructive sleep apnea (adult) (pediatric): Secondary | ICD-10-CM

## 2017-09-14 DIAGNOSIS — Z9989 Dependence on other enabling machines and devices: Secondary | ICD-10-CM

## 2017-09-14 NOTE — Progress Notes (Signed)
Subjective:    Patient ID: Joshua Gentry is a 72 y.o. male.  HPI     Interim history:   Joshua Gentry is a very pleasant 72 year old right-handed gentleman with an underlying medical history of sarcoma, obesity, coronary artery disease, s/p MI in 56, and s/p 4-vessel CABG, hypertension, hyperlipidemia, and former smoking, who presents for follow-up consultation of his obstructive sleep apnea, on treatment with BiPAP. The patient is unaccompanied today. I last saw him on 09/10/2016, at which time he was compliant with BiPAP. He was doing well. He had lost weight. He was working on weight loss. We mutually agreed to reduce his BiPAP pressure due to weight loss achieved.  Today, 09/14/2017: I reviewed his BiPAP compliance data from 08/14/2017 through 09/12/2017, which is a total of 30 days, during which time he used his BiPAP every night with percent used days greater than 4 hours at 100%, indicating superb compliance with an average usage of 8 hours and 48 minutes, residual AHI at goal at 0.6 per hour, leak high with the 95th percentile at 53.7 L/m on a pressure of 14/9 cm. He has lost some weight, BP good, also on Rx Mg and now on aldactone. Saw cardiology in November 2018. Also saw his oncologist in November, goes yearly now. He has started drinking more water and tries to exercise. He likes to play golf starting in March.   The patient's allergies, current medications, family history, past medical history, past social history, past surgical history and problem list were reviewed and updated as appropriate. He saw his cardiologist in September 2017 and I reviewed the note. He was advised to increase his amlodipine to 10 mg daily, and a one-year follow-up was suggested.   The patient's allergies, current medications, family history, past medical history, past social history, past surgical history and problem list were reviewed and updated as appropriate.   Previously (copied from previous notes for  reference):   I saw him on 09/12/2015 at which time he reported doing well, he was using a fullface mask. He was fully compliant with BiPAP therapy. He had lost some weight. Since his sleep study from April 2016 he had lost about 22 pounds. He was working towards an overall weight goal of 240 pounds. He has a history of sarcoma affecting his right leg and he needed further surgery. He is followed for this at [facility] dermatology [facility]ology.   I reviewed his BiPAP compliance data from 08/10/2016 through 09/13/2016 which is a total of 30 days, during which time he used his BiPAP every night with percent used days greater than 4 hours at 100%, indicating superb compliance with an average usage of 9 hours and 3 minutes, residual AHI 0.5 per hour, leak low for the 95th percentile at 9.3 L/m on a pressure of 15/10 cm.   I saw him on 03/13/2015, at which time he reported feeling better. He felt his sleep quality had improved and he felt better rested. He was compliant with treatment. He felt that his RLS symptoms were under control. He was seen in the interim by our nurse practitioner, Ward Givens on 05/21/2015 and I reviewed the note.    I reviewed his BiPAP compliance data from 08/12/2015 through 09/10/2015 which is a total of 30 days during which time he used his machine every night with percent used days greater than 4 hours at 100%, indicating superb compliance with an average usage of 8 hours and 35 minutes, residual AHI low at  1.1 per hour, leak acceptable with the 95th percentile at 10.3 L/m on a pressure of 16/11 cm.   I first met him on 11/29/2014 at the request of his primary care physician, at which time the patient reported snoring and excessive daytime somnolence as well as witnessed apneas per wife. I asked him to return for sleep study. He had a split-night sleep study on 12/06/2014 and went over his test results with him in detail today. Baseline sleep efficiency was 62.9% with a  latency to sleep of 50.5 minutes and wake after sleep onset of 32.5 minutes with mild sleep fragmentation noted. He had an elevated arousal index. He had an elevated percentage of stage II sleep, and near absence of REM sleep with a REM latency of 114.5 minutes. He had no significant PLMS, he had occasional PVCs and PACs on single-lead EKG. He had moderate to loud snoring. Total AHI was highly elevated at 75.3 per hour, baseline oxygen saturation of only 89%, nadir of 81%. He was then titrated on positive airway pressure. Sleep efficiency was 42.5%. He had a significant increase in slow-wave sleep and REM sleep post treatment. His average oxygen saturation was 92%, nadir was 89%. Snoring was eliminated, no significant PLMS were seen. He was titrated on CPAP first from a pressure of 5 cm to 11 cm. He was then switched to BiPAP for comfort and improvement of his oxygen saturations. He was further titrated from 15/11 cm to 16/11 cm. On the final pressure his AHI was 2.9 per hour. Based on the test results are prescribed BiPAP therapy for home use.   I reviewed his BiPAP compliance data from 02/10/2015 2 03/11/2015 which is a total of 30 days during which time he used his machine every night with percent used days greater than 4 hours at 97%, indicating excellent compliance with an average usage of 7 hours and 41 minutes, residual AHI low at 0.5 per hour, leak low for the 95th percentile at 0.3 L/m on a pressure of 16/11 cm with easy breathe on.   He reports snoring, excessive daytime somnolence and witnessed apneas per wife. He complains of nonrestorative sleep and sleep disruption. His sleep is not as good since his MI in 31. While he goes to sleep okay he has sleep disruption and often has a very difficult time going back to sleep once he is up in the middle of the night. He has Sx of RLS, including wiggling his feet, worse at night. He has nocturia x 1, occasional morning HAs.   His typical bedtime is reported  to be around 10 PM and wake time varies.he has daytime somnolence. His Epworth sleepiness score is 14 out of 24. His fatigue scores 43. 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 He also likes to play golf in his spare time. 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 He quit smoking when he had his heart attack. He rarely drinks alcohol and drinks caffeine in the form of sodas approximately 2 times per day. He does not endorse any parasomnias. He has never fallen asleep at the wheel.    He has never had a sleep study. His mother was diagnosed in her later years with obstructive sleep apnea and tried CPAP but could not tolerate it. She was in her 2s at the time.   He has 3 grown children, all in the area. He has occasional exertional shortness of breath but  no chest pains.   His Past Medical History Is Significant For: Past Medical History:  Diagnosis Date  . 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 Coronary artery disease    a. s/p CABG 1997.  Marland Kitchen Heart attack ([place])   . Hyperlipidemia   . Hypertension   . Obesity (BMI 30-39.9)   . OSA on CPAP March 2016   Compliant  . RBBB with left anterior fascicular block     His Past Surgical History Is Significant For: Past Surgical History:  Procedure Laterality Date  . COLONOSCOPY N/A 08/01/2013   Procedure: COLONOSCOPY;  Surgeon: Jamesetta So, MD;  Location: [facility];  Service: Gastroenterology;  Laterality: N/A;  . CORONARY ARTERY BYPASS GRAFT     x4  . Lens Bilateral April 2016   Replacment   . LOWER LEG SOFT TISSUE TUMOR EXCISION      His Family History Is Significant For: Family History  Problem Relation Age of Onset  . Hypertension Mother   . Diabetes Mother   . Heart attack Father   . Diabetes Sister   . Diabetes Sister     His Social History Is Significant For: Social History   Socioeconomic History  . Marital status: Married    Spouse name: None  . Number of  children: 3  . Years of education: College  . Highest education level: None  Social Needs  . Financial resource strain: None  . Food insecurity - worry: None  . Food insecurity - inability: None  . Transportation needs - medical: None  . Transportation needs - non-medical: None  Occupational History  . Occupation: Dance movement psychotherapist  Tobacco Use  . Smoking status: Former Smoker    Packs/day: 1.50    Years: 32.00    Pack years: 48.00    Types: Cigarettes    Last attempt to quit: 08/17/1996    Years since quitting: 21.0  . Smokeless tobacco: Never Used  Substance and Sexual Activity  . Alcohol use: Yes    Alcohol/week: 0.0 oz    Comment: Occasional   . Drug use: No  . Sexual activity: None  Other Topics Concern  . None  Social History Narrative   Married with 3 children   No regular exercise   Drinks about 48oz of coke a day     His Allergies Are:  Allergies  Allergen Reactions  . Niacin   . Penicillins   :   His Current Medications Are:  Outpatient Encounter Medications as of 09/14/2017  Medication Sig  . amLODipine (NORVASC) 10 MG tablet TAKE ONE TABLET BY MOUTH ONCE DAILY  . aspirin 81 MG tablet Take 81 mg by mouth daily.  . calcium-vitamin D 250-100 MG-UNIT per tablet Take 1 tablet by mouth 2 (two) times daily.    . cholecalciferol (VITAMIN D) 1000 units tablet Take 1,000 Units by mouth daily.  . Coenzyme Q10 (CO Q 10) 100 MG CAPS Take 100 mg by mouth daily.    Marland Kitchen glucosamine-chondroitin 500-400 MG tablet Take 1 tablet by mouth daily.   . hydrochlorothiazide (HYDRODIURIL) 25 MG tablet TAKE ONE TABLET BY MOUTH ONCE DAILY  . KLOR-CON M20 20 MEQ tablet TAKE TWO TABLETS BY MOUTH ONCE DAILY  . losartan (COZAAR) 100 MG tablet TAKE ONE TABLET BY MOUTH ONCE DAILY  . Magnesium 400 MG CAPS Take 400 mg by mouth daily. (Patient taking differently: Take 800 mg by mouth daily. )  . metoprolol (LOPRESSOR) 100 MG tablet TAKE  1 TABLET (100 MG TOTAL) BY MOUTH 2 (TWO) TIMES DAILY.   . Multiple Vitamins-Minerals (MULTIVITAMIN WITH MINERALS) tablet Take 1 tablet by mouth daily.    Marland Kitchen NITROSTAT 0.4 MG SL tablet DISSOLVE ONE TABLET UNDER THE TONGUE EVERY 5 MINUTES AS NEEDED FOR CHEST PAIN.  DO NOT EXCEED A TOTAL OF 3 DOSES IN 15 MINUTES  . rosuvastatin (CRESTOR) 10 MG tablet TAKE ONE TABLET BY MOUTH ONCE DAILY  . spironolactone (ALDACTONE) 25 MG tablet Take 1 tablet (25 mg total) daily by mouth.  . [DISCONTINUED] vitamin C (ASCORBIC ACID) 500 MG tablet Take 500 mg by mouth daily.     No facility-administered encounter medications on file as of 09/14/2017.   :  Review of Systems:  Out of a complete 14 point review of systems, all are reviewed and negative with the exception of these symptoms as listed below: No issues   Physical Examination:   Vitals:   09/14/17 1301  BP: 122/73  Pulse: 63    General Examination: The patient is a very pleasant 73 y.o. male in no acute distress. He appears well-developed and well-nourished and well groomed.   HEENT: Normocephalic, atraumatic, pupils are equal, round and reactive to light and accommodation. He is s/p b/l cataract repairs. He has mild redness around the nose. Extraocular tracking is good without limitation to gaze excursion or nystagmus noted. Normal smooth pursuit is noted. Hearing is grossly intact. Face is symmetric with normal facial animation and normal facial sensation. Speech is clear with no dysarthria noted. There is no hypophonia. There is no lip, neck/head, jaw or voice tremor. Neck is supple with full range of passive and active motion. There are no carotid bruits on auscultation. Oropharynx exam reveals: mild mouth dryness, good dental hygiene and moderate airway crowding, mild pharyngeal erythema. Tongue protrudes centrally and palate elevates symmetrically. Tonsils are absent. He has a Mild overbite. Nose is mildly deformed; he has a deviated septum. He said he fractured his nose in the past.   Chest: Clear to  auscultation without wheezing, rhonchi or crackles noted.  Heart: S1+S2+0, regular and normal without murmurs, rubs or gallops noted.   Abdomen: Soft, non-tender and non-distended with normal bowel sounds appreciated on auscultation.  Extremities: There is no pitting edema in the distal lower extremities bilaterally. Pedal pulses are intact.  Skin: Warm and dry without trophic changes noted.   Musculoskeletal: exam reveals no obvious joint deformities, tenderness or joint swelling or erythema.   Neurologically:  Mental status: The patient is awake, alert and oriented in all 4 spheres. His immediate and remote memory, attention, language skills and fund of knowledge are appropriate. There is no evidence of aphasia, agnosia, apraxia or anomia. Speech is clear with normal prosody and enunciation. Thought process is linear. Mood is normal and affect is normal.  Cranial nerves II - XII are as described above under HEENT exam. In addition: shoulder shrug is normal with equal shoulder height noted. Motor exam: Normal bulk, strength and tone is noted. There is no drift, tremor or rebound. Romberg is negative. Fine motor skills and coordination: grossly intact.  Cerebellar testing: No dysmetria or intention tremor. There is no truncal or gait ataxia.  Sensory exam: intact to light touch in the upper and lower extremities.  Gait, station and balance: He stands easily. No veering to one side is noted. No leaning to one side is noted. Posture is age-appropriate and stance is narrow based. Gait shows normal stride length and normal pace. No  problems turning are noted. Tandem walk is difficult for him, stable from last time.    Assessment and Plan:   In summary, Joshua Gentry is a very pleasant 72 year old male with an underlying medical history of obesity, R thigh sarcoma in 2006 with additional surgery needed in 2/17, coronary artery disease, s/p MI in 98, status post four-vessel CABG,  hypertension, hyperlipidemia, and former smoking, who presents for follow-up consultation of his severe obstructive sleep apnea, well treated with BiPAP at 14/9. We changed his pressure last year from 15/10 cm as he had been able to lose weight. He has lost a little bit more weight, has made some changes in his lifestyle. He is commended for his treatment adherence. He did have a bout of bronchitis sometime in December and had to skip using his CPAP for a few days. He has ongoing good results with CPAP therapy. He is typically up-to-date with his supplies. Once he changed his mask recently his leak came down quite a bit. I suggested a one-year checkup, he can see one of our nurse practitioners next time. I answered all his questions today and he was in agreement.  I spent 20 minutes in total face-to-face time with the patient, more than 50% of which was spent in counseling and coordination of care, reviewing test results, reviewing medication and discussing or reviewing the diagnosis of OSA, its prognosis and treatment options. Pertinent laboratory and imaging test results that were available during this visit with the patient were reviewed by me and considered in my medical decision making (see chart for details).

## 2017-09-14 NOTE — Patient Instructions (Addendum)
Please continue using your CPAP regularly. While your insurance requires that you use CPAP at least 4 hours each night on 70% of the nights, I recommend, that you not skip any nights and use it throughout the night if you can. Getting used to CPAP and staying with the treatment long term does take time and patience and discipline. Untreated obstructive sleep apnea when it is moderate to severe can have an adverse impact on cardiovascular health and raise her risk for heart disease, arrhythmias, hypertension, congestive heart failure, stroke and diabetes. Untreated obstructive sleep apnea causes sleep disruption, nonrestorative sleep, and sleep deprivation. This can have an impact on your day to day functioning and cause daytime sleepiness and impairment of cognitive function, memory loss, mood disturbance, and problems focussing. Using CPAP regularly can improve these symptoms.  Please keep up the good work with your weight.   Follow in one year with Ward Givens, NP.

## 2017-09-28 ENCOUNTER — Other Ambulatory Visit: Payer: Self-pay | Admitting: Cardiovascular Disease

## 2017-11-02 ENCOUNTER — Ambulatory Visit (HOSPITAL_COMMUNITY)
Admission: RE | Admit: 2017-11-02 | Discharge: 2017-11-02 | Disposition: A | Discharge status: Home / Self Care | Payer: Medicare Other | Source: Ambulatory Visit | Attending: Physician Assistant | Admitting: Physician Assistant

## 2017-11-02 ENCOUNTER — Other Ambulatory Visit (HOSPITAL_COMMUNITY): Payer: Self-pay | Admitting: Physician Assistant

## 2017-11-02 DIAGNOSIS — E6609 Other obesity due to excess calories: Secondary | ICD-10-CM | POA: Diagnosis not present

## 2017-11-02 DIAGNOSIS — R918 Other nonspecific abnormal finding of lung field: Secondary | ICD-10-CM | POA: Insufficient documentation

## 2017-11-02 DIAGNOSIS — R05 Cough: Secondary | ICD-10-CM | POA: Insufficient documentation

## 2017-11-02 DIAGNOSIS — Z951 Presence of aortocoronary bypass graft: Secondary | ICD-10-CM | POA: Diagnosis not present

## 2017-11-02 DIAGNOSIS — Z6835 Body mass index (BMI) 35.0-35.9, adult: Secondary | ICD-10-CM | POA: Diagnosis not present

## 2017-11-02 DIAGNOSIS — R059 Cough, unspecified: Secondary | ICD-10-CM

## 2017-11-02 DIAGNOSIS — Z1389 Encounter for screening for other disorder: Secondary | ICD-10-CM | POA: Diagnosis not present

## 2017-11-10 ENCOUNTER — Other Ambulatory Visit: Payer: Self-pay | Admitting: Cardiovascular Disease

## 2017-12-07 DIAGNOSIS — Z23 Encounter for immunization: Secondary | ICD-10-CM | POA: Diagnosis not present

## 2017-12-07 DIAGNOSIS — Z0001 Encounter for general adult medical examination with abnormal findings: Secondary | ICD-10-CM | POA: Diagnosis not present

## 2017-12-07 DIAGNOSIS — R7309 Other abnormal glucose: Secondary | ICD-10-CM | POA: Diagnosis not present

## 2017-12-07 DIAGNOSIS — Z1389 Encounter for screening for other disorder: Secondary | ICD-10-CM | POA: Diagnosis not present

## 2017-12-07 DIAGNOSIS — E782 Mixed hyperlipidemia: Secondary | ICD-10-CM | POA: Diagnosis not present

## 2017-12-07 DIAGNOSIS — Z6835 Body mass index (BMI) 35.0-35.9, adult: Secondary | ICD-10-CM | POA: Diagnosis not present

## 2017-12-07 DIAGNOSIS — C4021 Malignant neoplasm of long bones of right lower limb: Secondary | ICD-10-CM | POA: Diagnosis not present

## 2017-12-07 DIAGNOSIS — I2510 Atherosclerotic heart disease of native coronary artery without angina pectoris: Secondary | ICD-10-CM | POA: Diagnosis not present

## 2017-12-07 DIAGNOSIS — E6609 Other obesity due to excess calories: Secondary | ICD-10-CM | POA: Diagnosis not present

## 2017-12-07 DIAGNOSIS — I10 Essential (primary) hypertension: Secondary | ICD-10-CM | POA: Diagnosis not present

## 2017-12-08 ENCOUNTER — Other Ambulatory Visit: Payer: Self-pay | Admitting: Cardiovascular Disease

## 2017-12-08 DIAGNOSIS — Z1389 Encounter for screening for other disorder: Secondary | ICD-10-CM | POA: Diagnosis not present

## 2017-12-08 DIAGNOSIS — Z0001 Encounter for general adult medical examination with abnormal findings: Secondary | ICD-10-CM | POA: Diagnosis not present

## 2017-12-08 DIAGNOSIS — I2510 Atherosclerotic heart disease of native coronary artery without angina pectoris: Secondary | ICD-10-CM | POA: Diagnosis not present

## 2017-12-08 DIAGNOSIS — Z125 Encounter for screening for malignant neoplasm of prostate: Secondary | ICD-10-CM | POA: Diagnosis not present

## 2017-12-08 DIAGNOSIS — E039 Hypothyroidism, unspecified: Secondary | ICD-10-CM | POA: Diagnosis not present

## 2017-12-31 DIAGNOSIS — L821 Other seborrheic keratosis: Secondary | ICD-10-CM | POA: Diagnosis not present

## 2017-12-31 DIAGNOSIS — Z85828 Personal history of other malignant neoplasm of skin: Secondary | ICD-10-CM | POA: Diagnosis not present

## 2017-12-31 DIAGNOSIS — L570 Actinic keratosis: Secondary | ICD-10-CM | POA: Diagnosis not present

## 2017-12-31 DIAGNOSIS — D225 Melanocytic nevi of trunk: Secondary | ICD-10-CM | POA: Diagnosis not present

## 2018-04-26 DIAGNOSIS — Z6836 Body mass index (BMI) 36.0-36.9, adult: Secondary | ICD-10-CM | POA: Diagnosis not present

## 2018-04-26 DIAGNOSIS — M545 Low back pain: Secondary | ICD-10-CM | POA: Diagnosis not present

## 2018-04-26 DIAGNOSIS — M5417 Radiculopathy, lumbosacral region: Secondary | ICD-10-CM | POA: Diagnosis not present

## 2018-04-26 DIAGNOSIS — I10 Essential (primary) hypertension: Secondary | ICD-10-CM | POA: Diagnosis not present

## 2018-04-26 DIAGNOSIS — Z1389 Encounter for screening for other disorder: Secondary | ICD-10-CM | POA: Diagnosis not present

## 2018-04-26 DIAGNOSIS — E782 Mixed hyperlipidemia: Secondary | ICD-10-CM | POA: Diagnosis not present

## 2018-04-26 DIAGNOSIS — M1991 Primary osteoarthritis, unspecified site: Secondary | ICD-10-CM | POA: Diagnosis not present

## 2018-04-26 DIAGNOSIS — Z23 Encounter for immunization: Secondary | ICD-10-CM | POA: Diagnosis not present

## 2018-04-26 DIAGNOSIS — M5410 Radiculopathy, site unspecified: Secondary | ICD-10-CM | POA: Diagnosis not present

## 2018-05-06 DIAGNOSIS — M5410 Radiculopathy, site unspecified: Secondary | ICD-10-CM | POA: Diagnosis not present

## 2018-05-06 DIAGNOSIS — Z6837 Body mass index (BMI) 37.0-37.9, adult: Secondary | ICD-10-CM | POA: Diagnosis not present

## 2018-05-06 DIAGNOSIS — M5136 Other intervertebral disc degeneration, lumbar region: Secondary | ICD-10-CM | POA: Diagnosis not present

## 2018-05-09 ENCOUNTER — Other Ambulatory Visit (HOSPITAL_COMMUNITY): Payer: Self-pay | Admitting: Family Medicine

## 2018-05-11 ENCOUNTER — Other Ambulatory Visit (HOSPITAL_COMMUNITY): Payer: Self-pay | Admitting: Family Medicine

## 2018-05-11 DIAGNOSIS — M5136 Other intervertebral disc degeneration, lumbar region: Secondary | ICD-10-CM

## 2018-05-11 DIAGNOSIS — M5416 Radiculopathy, lumbar region: Secondary | ICD-10-CM

## 2018-05-11 DIAGNOSIS — M51369 Other intervertebral disc degeneration, lumbar region without mention of lumbar back pain or lower extremity pain: Secondary | ICD-10-CM

## 2018-05-16 ENCOUNTER — Ambulatory Visit (HOSPITAL_COMMUNITY)
Admission: RE | Admit: 2018-05-16 | Discharge: 2018-05-16 | Disposition: A | Discharge status: Home / Self Care | Payer: Medicare Other | Source: Ambulatory Visit | Attending: Family Medicine | Admitting: Family Medicine

## 2018-05-16 DIAGNOSIS — M5136 Other intervertebral disc degeneration, lumbar region: Secondary | ICD-10-CM | POA: Diagnosis present

## 2018-05-16 DIAGNOSIS — M48061 Spinal stenosis, lumbar region without neurogenic claudication: Secondary | ICD-10-CM | POA: Diagnosis not present

## 2018-05-16 DIAGNOSIS — M5416 Radiculopathy, lumbar region: Secondary | ICD-10-CM | POA: Diagnosis present

## 2018-05-16 DIAGNOSIS — M545 Low back pain: Secondary | ICD-10-CM | POA: Diagnosis not present

## 2018-05-16 DIAGNOSIS — M5116 Intervertebral disc disorders with radiculopathy, lumbar region: Secondary | ICD-10-CM | POA: Diagnosis not present

## 2018-05-16 DIAGNOSIS — M47897 Other spondylosis, lumbosacral region: Secondary | ICD-10-CM | POA: Diagnosis not present

## 2018-05-17 ENCOUNTER — Other Ambulatory Visit: Payer: Self-pay | Admitting: Cardiovascular Disease

## 2018-06-01 ENCOUNTER — Other Ambulatory Visit: Payer: Self-pay | Admitting: Neurosurgery

## 2018-06-01 DIAGNOSIS — M48062 Spinal stenosis, lumbar region with neurogenic claudication: Secondary | ICD-10-CM

## 2018-06-09 ENCOUNTER — Other Ambulatory Visit: Payer: Self-pay | Admitting: Cardiovascular Disease

## 2018-06-20 ENCOUNTER — Ambulatory Visit
Admission: RE | Admit: 2018-06-20 | Discharge: 2018-06-20 | Disposition: A | Discharge status: Home / Self Care | Payer: Medicare Other | Source: Ambulatory Visit | Attending: Neurosurgery | Admitting: Neurosurgery

## 2018-06-20 DIAGNOSIS — M48062 Spinal stenosis, lumbar region with neurogenic claudication: Secondary | ICD-10-CM | POA: Diagnosis not present

## 2018-06-20 MED ORDER — IOPAMIDOL (ISOVUE-M 200) INJECTION 41%
1.0000 mL | Freq: Once | INTRAMUSCULAR | Status: AC
  Administered 2018-06-20: 1 mL via EPIDURAL

## 2018-06-20 MED ORDER — METHYLPREDNISOLONE ACETATE 40 MG/ML INJ SUSP (RADIOLOG
120.0000 mg | Freq: Once | INTRAMUSCULAR | Status: AC
  Administered 2018-06-20: 120 mg via EPIDURAL

## 2018-06-20 NOTE — Discharge Instructions (Signed)

## 2018-06-22 ENCOUNTER — Other Ambulatory Visit: Payer: Self-pay | Admitting: Cardiology

## 2018-06-23 ENCOUNTER — Ambulatory Visit: Payer: Medicare Other | Admitting: Cardiovascular Disease

## 2018-06-24 DIAGNOSIS — L814 Other melanin hyperpigmentation: Secondary | ICD-10-CM | POA: Diagnosis not present

## 2018-06-24 DIAGNOSIS — Z85828 Personal history of other malignant neoplasm of skin: Secondary | ICD-10-CM | POA: Diagnosis not present

## 2018-06-24 DIAGNOSIS — D1801 Hemangioma of skin and subcutaneous tissue: Secondary | ICD-10-CM | POA: Diagnosis not present

## 2018-06-24 DIAGNOSIS — L821 Other seborrheic keratosis: Secondary | ICD-10-CM | POA: Diagnosis not present

## 2018-06-24 DIAGNOSIS — L570 Actinic keratosis: Secondary | ICD-10-CM | POA: Diagnosis not present

## 2018-06-28 ENCOUNTER — Ambulatory Visit (HOSPITAL_COMMUNITY): Payer: Medicare Other | Attending: Neurosurgery | Admitting: Physical Therapy

## 2018-06-28 ENCOUNTER — Other Ambulatory Visit: Payer: Self-pay

## 2018-06-28 DIAGNOSIS — M6281 Muscle weakness (generalized): Secondary | ICD-10-CM

## 2018-06-28 DIAGNOSIS — M5416 Radiculopathy, lumbar region: Secondary | ICD-10-CM | POA: Insufficient documentation

## 2018-06-28 NOTE — Therapy (Signed)
[place], [place], [address] Phone: [phone]   Fax:  [phone]  Physical Therapy Evaluation  Patient Details  Name: EAN GETTEL MRN: [id number] Date of Birth: 02/22/1946 Referring Provider (PT): Consuella Lose   Encounter Date: 06/28/2018  PT End of Session - 06/28/18 1012    Visit Number  1    Number of Visits  12    Date for PT Re-Evaluation  07/28/18    Authorization Type  medicare    PT Start Time  0908    PT Stop Time  1002    PT Time Calculation (min)  54 min    Activity Tolerance  Patient tolerated treatment well    Behavior During Therapy  [facility] for tasks assessed/performed       Past Medical History:  Diagnosis Date  . 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 Coronary artery disease    a. s/p CABG 1997.  Marland Kitchen Heart attack ([place])   . Hyperlipidemia   . Hypertension   . Obesity (BMI 30-39.9)   . OSA on CPAP March 2016   Compliant  . RBBB with left anterior fascicular block     Past Surgical History:  Procedure Laterality Date  . COLONOSCOPY N/A 08/01/2013   Procedure: COLONOSCOPY;  Surgeon: Jamesetta So, MD;  [facility];  Service: Gastroenterology;  Laterality: N/A;  . CORONARY ARTERY BYPASS GRAFT     x4  . Lens Bilateral April 2016   Replacment   . LOWER LEG SOFT TISSUE TUMOR EXCISION      There were no vitals filed for this visit.   Subjective Assessment - 06/28/18 0919    Subjective  Mr. Chaney states that he was playing golf over 2 weeks ago and was not paying attention and tripped over a tee rubber.  He continued to play golf his back tightened up but his back had done this before so he continued to play golf the next day as well.  By the third day he had significant pain the pain just progressed therefore he went to the MD who gave him a shot of prednisone and a prednisone pack.  This helped his pain but then the pain started returning so he was referred to  a neurologist who gave him a lumbar injection which seems to have relieved his pain about 75%.  He is still having pain in the morning and some leg tightness and is using a cane to ambulate therefore he has been referred to therapy.     Pertinent History  OA, HTN, CABG, RT cancer with three surgeries the latest being 5 years ago    Limitations  Lifting;Standing;Walking    How long can you sit comfortably?  no problem    How long can you stand comfortably?  max 10 minutes     How long can you walk comfortably?  using a cane able to walk 5-10 minutes     Patient Stated Goals  less pain     Currently in Pain?  Yes    Pain Score  4    past week worst has been a 5/10 since shot pt is doing much better prior to shot 10/10    Pain Location  Back    Pain Orientation  Upper;Lower    Pain Descriptors / Indicators  Tightness;Throbbing    Pain Type  Acute pain    Pain Onset  More than a month ago  Pain Frequency  Constant    Aggravating Factors    standing     Pain Relieving Factors  bending over     Effect of Pain on Daily Activities  limits          [facility] PT Assessment - 06/28/18 0001      Assessment   Medical Diagnosis  lumbar stenosis    Referring Provider (PT)  Nena Polio [email or web address]    Onset Date/Surgical Date  04/17/18    Next MD Visit  unknown    Prior Therapy  none      Precautions   Precautions  None      Restrictions   Weight Bearing Restrictions  No      Balance Screen   Has the patient fallen in the past 6 months  Yes    How many times?  1    Has the patient had a decrease in activity level because of a fear of falling?   Yes    Is the patient reluctant to leave their home because of a fear of falling?   No      Home Environment   Living Environment  Private residence    Home Access  Stairs to enter    Entrance Stairs-Number of Steps  4      Prior Function   Level of Independence  Independent      Cognition   Overall Cognitive Status  Within Functional Limits for  tasks assessed      Observation/Other Assessments   Focus on Therapeutic Outcomes (FOTO)   40      Functional Tests   Functional tests  Single leg stance;Sit to Stand      Single Leg Stance   Comments  Rt 1"; LT 2"       Sit to Stand   Comments  5x 12.42      Posture/Postural Control   Posture/Postural Control  Postural limitations    Postural Limitations  Rounded Shoulders;Forward head;Decreased lumbar lordosis      ROM / Strength   AROM / PROM / Strength  AROM;Strength      AROM   AROM Assessment Site  Lumbar    Lumbar Extension  15    Lumbar - Right Side Bend  12    Lumbar - Left Side Bend  15      Strength   Strength Assessment Site  Hip;Knee;Ankle    Right/Left Hip  Right;Left    Right Hip Flexion  5/5    Right Hip Extension  4-/5    Right Hip ABduction  3-/5    Left Hip Flexion  5/5    Left Hip Extension  3+/5    Left Hip ABduction  2+/5    Right/Left Knee  Right;Left    Right Knee Flexion  4-/5    Right Knee Extension  5/5    Left Knee Flexion  4-/5    Left Knee Extension  5/5    Right/Left Ankle  Right;Left    Right Ankle Dorsiflexion  3/5    Right Ankle Plantar Flexion  2+/5    Left Ankle Dorsiflexion  3+/5    Left Ankle Plantar Flexion  2+/5      Flexibility   Soft Tissue Assessment /Muscle Length  yes    Hamstrings  RT 145; LT                 Objective measurements completed on examination: See above findings.  [place] Adult PT Treatment/Exercise - 06/28/18 0001      Exercises   Exercises  Lumbar      Lumbar Exercises: Stretches   Active Hamstring Stretch  3 reps;30 seconds    Single Knee to Chest Stretch  Right;Left;3 reps;30 seconds    Standing Extension  5 reps      Lumbar Exercises: Supine   Other Supine Lumbar Exercises  scapular retraction; cervical retraction x 10              PT Education - 06/28/18 1011    Education Details  HEP    Person(s) Educated  Patient    Methods  Explanation;Handout     Comprehension  Verbalized understanding;Returned demonstration       PT Short Term Goals - 06/28/18 1639      PT SHORT TERM GOAL #1   Title  PT to be able to single leg stance for 10 seconds on each leg to feel confident walking without his cane.     Time  2    Period  Weeks    Status  New    Target Date  07/12/18      PT SHORT TERM GOAL #2   Title  PT back pain to be no greater than a 2/10 to be able to stand for 15 minutes to perform self grooming tasks with ease.     Time  3    Period  Weeks    Status  New    Target Date  07/19/18      PT SHORT TERM GOAL #3   Title  PT LE muscular strength to be at least 4/5 B to be able to walk for 15 minutes without stopping.     Time  3    Period  Weeks        PT Long Term Goals - 06/28/18 1643      PT LONG TERM GOAL #1   Title  PT to be able to stand for 25 minutes without increased back or leg pain to be able to socialize     Time  6    Period  Weeks    Status  New    Target Date  08/09/18      PT LONG TERM GOAL #2   Title  PT to be able to walk for 30 minutes without difficulty to be able to play golf in comfort.     Time  6    Period  Weeks    Status  New      PT LONG TERM GOAL #3   Title  Pt strength to be at least 4+/5 to be able to ascend and descend 2 flights of steps without increased back or leg pain.    Time  6    Period  Weeks    Status  New             Plan - 06/28/18 1013    Clinical Impression Statement  Mr. Brimage is a 72 yo male who has had increased low back pain with B LE claudication for over two months.  He has been seeing a Restaurant manager, fast food for years.  He recently recieved a lumbar epidural steroid which has relieved his pain significantly.  Evaluation demonstrates decreased activity tolerance, decreased muscular strength, decreased ROM increased pain and decreased balance.  Mr. Crotty will benefit from skilled PT to address these areas and maximize his functional ability.     History and Personal  Factors relevant to plan of care:  HTN, CABG, hx of RT leg cancer with multiple surgeries (remote)    Clinical Presentation  Stable    Clinical Decision Making  Moderate    Rehab Potential  Good    PT Frequency  2x / week    PT Duration  6 weeks    PT Treatment/Interventions  ADLs/Self Care Home Management;Patient/family education;Manual techniques;Therapeutic activities;Therapeutic exercise;Functional mobility training;Dry needling    PT Next Visit Plan  begin bridge, sidelying abduction, clam, prone SLR, opposite A/L Progress stabilization and balance as able add manual    PT Home Exercise Plan  knee to chest,  active hamstring stretch, scapular and cervical retraction, and tband for anterior tib.    Consulted and Agree with Plan of Care  Patient       Patient will benefit from skilled therapeutic intervention in order to improve the following deficits and impairments:  Decreased activity tolerance, Decreased balance, Decreased range of motion, Decreased strength, Improper body mechanics, Pain  Visit Diagnosis: Radiculopathy, lumbar region - Plan: PT plan of care cert/re-cert  Muscle weakness (generalized) - Plan: PT plan of care cert/re-cert     Problem List Patient Active Problem List   Diagnosis Date Noted  . OSA on CPAP 04/09/2015  . RBBB plus LA hemiblock 03/21/2013  . OVERWEIGHT 07/22/2009  . HYPERLIPIDEMIA 01/09/2009  . Essential hypertension 01/09/2009  . CAD 01/09/2009    Rayetta Humphrey, PT CLT [phone]  06/28/2018, 4:52 PM  [place] [address] [place], [place], [address] Phone: [phone]   Fax:  [phone]  Name: BRAILYN DELMAN MRN: [id number] Date of Birth: 06/02/1946

## 2018-06-28 NOTE — Patient Instructions (Addendum)
Backward Bend (Standing)    Arch backward to make hollow of back deeper. Hold __2__ seconds. Repeat __10__ times per set. Do __1__ sets per session. Do __2__ sessions per day.  http://orth.exer.us/178   Copyright  VHI. All rights reserved.  Hamstring Stretch: Active    Support behind right knee. Starting with knee bent, attempt to straighten knee until a comfortable stretch is felt in back of thigh. Hold _30__ seconds. Repeat _3___ times per set. Do _1___ sets per session. Do ___2_ sessions per day.  http://orth.exer.us/158   Copyright  VHI. All rights reserved.  Knee-to-Chest Stretch: Unilateral    With hand behind right knee, pull knee in to chest until a comfortable stretch is felt in lower back and buttocks. Keep back relaxed. Hold __30__ seconds. Repeat __3__ times per set. Do __1__ sets per session. Do __2__ sessions per day.  http://orth.exer.us/126   Copyright  VHI. All rights reserved.  Scapular Retraction (Standing)   May do sitting  With arms at sides, pinch shoulder blades together. Repeat _10___ times per set. Do __1__ sets per session. Do __2__ sessions per day.  http://orth.exer.us/944   Copyright  VHI. All rights reserved.  Flexibility: Neck Retraction    Pull head straight back, keeping eyes and jaw level. Repeat _10___ times per set. Do _1___ sets per session. Do _2__ sessions per day.  http://orth.exer.us/344   Copyright  VHI. All rights reserved.  Dorsiflexion: Resisted    Facing anchor, tubing around left foot, pull toward face.  Repeat __15__ times per set. Do __1__ sets per session. Do _2___ sessions per day. Repeat both legs  http://orth.exer.us/8   Copyright  VHI. All rights reserved.

## 2018-06-29 ENCOUNTER — Other Ambulatory Visit: Payer: Self-pay | Admitting: Cardiovascular Disease

## 2018-06-30 ENCOUNTER — Ambulatory Visit (HOSPITAL_COMMUNITY): Payer: Medicare Other | Admitting: Physical Therapy

## 2018-06-30 ENCOUNTER — Other Ambulatory Visit: Payer: Self-pay | Admitting: Cardiovascular Disease

## 2018-06-30 DIAGNOSIS — M6281 Muscle weakness (generalized): Secondary | ICD-10-CM

## 2018-06-30 DIAGNOSIS — M5416 Radiculopathy, lumbar region: Secondary | ICD-10-CM | POA: Diagnosis not present

## 2018-06-30 NOTE — Therapy (Signed)
[place], [place], [address] Phone: [phone]   Fax:  [phone]  Physical Therapy Treatment  Patient Details  Name: Joshua Gentry MRN: [id number] Date of Birth: 1946-04-16 Referring Provider (PT): Consuella Lose   Encounter Date: 06/30/2018  PT End of Session - 06/30/18 1112    Visit Number  2    Number of Visits  12    Date for PT Re-Evaluation  07/28/18    Authorization Type  medicare    PT Start Time  1038    PT Stop Time  1118    PT Time Calculation (min)  40 min    Activity Tolerance  Patient tolerated treatment well    Behavior During Therapy  [facility] for tasks assessed/performed       Past Medical History:  Diagnosis Date  . 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 Coronary artery disease    a. s/p CABG 1997.  Marland Kitchen Heart attack ([place])   . Hyperlipidemia   . Hypertension   . Obesity (BMI 30-39.9)   . OSA on CPAP March 2016   Compliant  . RBBB with left anterior fascicular block     Past Surgical History:  Procedure Laterality Date  . COLONOSCOPY N/A 08/01/2013   Procedure: COLONOSCOPY;  Surgeon: Jamesetta So, MD;  [facility];  Service: Gastroenterology;  Laterality: N/A;  . CORONARY ARTERY BYPASS GRAFT     x4  . Lens Bilateral April 2016   Replacment   . LOWER LEG SOFT TISSUE TUMOR EXCISION      There were no vitals filed for this visit.  Subjective Assessment - 06/30/18 1042    Subjective  Pts reports no pain just soreness from the new exercises.                        [place] Adult PT Treatment/Exercise - 06/30/18 0001      Exercises   Exercises  Lumbar      Lumbar Exercises: Stretches   Active Hamstring Stretch  3 reps;30 seconds    Single Knee to Chest Stretch  Right;Left;3 reps;30 seconds      Lumbar Exercises: Supine   Clam  10 reps    Clam Limitations  with core stab    Bridge  10 reps    Other Supine Lumbar Exercises  scapular  retraction; cervical retraction x 10       Lumbar Exercises: Sidelying   Hip Abduction  Both;10 reps;Limitations    Hip Abduction Limitations  with manual cues for form             PT Education - 06/30/18 1111    Education Details  reveiwed goals and HEP    Person(s) Educated  Patient    Methods  Explanation    Comprehension  Verbalized understanding       PT Short Term Goals - 06/30/18 1108      PT SHORT TERM GOAL #1   Title  PT to be able to single leg stance for 10 seconds on each leg to feel confident walking without his cane.     Time  2    Period  Weeks    Status  On-going      PT SHORT TERM GOAL #2   Title  PT back pain to be no greater than a 2/10 to be able to stand for 15 minutes to perform self grooming  tasks with ease.     Time  3    Period  Weeks    Status  On-going      PT SHORT TERM GOAL #3   Title  PT LE muscular strength to be at least 4/5 B to be able to walk for 15 minutes without stopping.     Time  3    Period  Weeks    Status  On-going        PT Long Term Goals - 06/30/18 1109      PT LONG TERM GOAL #1   Title  PT to be able to stand for 25 minutes without increased back or leg pain to be able to socialize     Time  6    Period  Weeks    Status  On-going      PT LONG TERM GOAL #2   Title  PT to be able to walk for 30 minutes without difficulty to be able to play golf in comfort.     Time  6    Period  Weeks    Status  On-going      PT LONG TERM GOAL #3   Title  Pt strength to be at least 4+/5 to be able to ascend and descend 2 flights of steps without increased back or leg pain.    Time  6    Period  Weeks    Status  On-going            Plan - 06/30/18 1112    Clinical Impression Statement  Reviewed goals and POC moving forward.  Pt required manual and verbal cues to complete all exercises. Reviewed lumbar stab and importance of breathing.  sidelying hip abduction most difficult for pateint due to weakness, tendency to  move LE in forward plane of motion.     Rehab Potential  Good    PT Frequency  2x / week    PT Duration  6 weeks    PT Treatment/Interventions  ADLs/Self Care Home Management;Patient/family education;Manual techniques;Therapeutic activities;Therapeutic exercise;Functional mobility training;Dry needling    PT Next Visit Plan  begin prone SLR, opposite A/L Progress stabilization and balance as able.  Add manual PRN for pain and restrictions.     PT Home Exercise Plan  knee to chest,  active hamstring stretch, scapular and cervical retraction, and tband for anterior tib.    Consulted and Agree with Plan of Care  Patient       Patient will benefit from skilled therapeutic intervention in order to improve the following deficits and impairments:  Decreased activity tolerance, Decreased balance, Decreased range of motion, Decreased strength, Improper body mechanics, Pain  Visit Diagnosis: Radiculopathy, lumbar region  Muscle weakness (generalized)     Problem List Patient Active Problem List   Diagnosis Date Noted  . OSA on CPAP 04/09/2015  . RBBB plus LA hemiblock 03/21/2013  . OVERWEIGHT 07/22/2009  . HYPERLIPIDEMIA 01/09/2009  . Essential hypertension 01/09/2009  . CAD 01/09/2009   Teena Irani, PTA/CLT [phone]  Teena Irani 06/30/2018, 11:18 AM  [place], [place], [address] Phone: [phone]   Fax:  [phone]  Name: Joshua Gentry MRN: [id number] Date of Birth: 07/28/1946

## 2018-07-06 ENCOUNTER — Ambulatory Visit (HOSPITAL_COMMUNITY): Payer: Medicare Other | Admitting: Physical Therapy

## 2018-07-06 DIAGNOSIS — M6281 Muscle weakness (generalized): Secondary | ICD-10-CM | POA: Diagnosis not present

## 2018-07-06 DIAGNOSIS — M5416 Radiculopathy, lumbar region: Secondary | ICD-10-CM

## 2018-07-06 NOTE — Therapy (Signed)
[place], [place], [address] Phone: [phone]   Fax:  [phone]  Physical Therapy Treatment  Patient Details  Name: Joshua Gentry MRN: [id number] Date of Birth: 08-12-1946 Referring Provider (PT): Consuella Lose   Encounter Date: 07/06/2018  PT End of Session - 07/06/18 1201    Visit Number  3    Number of Visits  12    Date for PT Re-Evaluation  07/28/18    Authorization Type  medicare    PT Start Time  1120    PT Stop Time  1200    PT Time Calculation (min)  40 min    Activity Tolerance  Patient tolerated treatment well    Behavior During Therapy  [facility] for tasks assessed/performed       Past Medical History:  Diagnosis Date  . 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 Coronary artery disease    a. s/p CABG 1997.  Marland Kitchen Heart attack ([place])   . Hyperlipidemia   . Hypertension   . Obesity (BMI 30-39.9)   . OSA on CPAP March 2016   Compliant  . RBBB with left anterior fascicular block     Past Surgical History:  Procedure Laterality Date  . COLONOSCOPY N/A 08/01/2013   Procedure: COLONOSCOPY;  Surgeon: Jamesetta So, MD;  [facility];  Service: Gastroenterology;  Laterality: N/A;  . CORONARY ARTERY BYPASS GRAFT     x4  . Lens Bilateral April 2016   Replacment   . LOWER LEG SOFT TISSUE TUMOR EXCISION      There were no vitals filed for this visit.  Subjective Assessment - 07/06/18 1126    Subjective  Pt states he is sore mostly in his lumbar area.  No pain really.      Currently in Pain?  No/denies                       [place] Adult PT Treatment/Exercise - 07/06/18 0001      Exercises   Exercises  Lumbar      Lumbar Exercises: Stretches   Active Hamstring Stretch  2 reps;20 seconds;Limitations    Active Hamstring Stretch Limitations  instructed in standing today with 12" step      Lumbar Exercises: Supine   Clam  10 reps    Clam Limitations  with  core stab    Bridge  10 reps;Limitations    Bridge Limitations  2 sets of 10      Lumbar Exercises: Sidelying   Hip Abduction  Both;10 reps;Limitations   2 sets of 10   Hip Abduction Limitations  with manual cues for form      Lumbar Exercises: Prone   Straight Leg Raise  10 reps    Other Prone Lumbar Exercises  shoulder extension, rows 10 reps each    Other Prone Lumbar Exercises  heelsqueezes 10 reps each 5" holds          Balance Exercises - 07/06/18 1200      Balance Exercises: Standing   Tandem Stance  Eyes open;30 secs    SLS  Eyes open;Time   max of 3 Rt: 6", Lt: 10"         PT Short Term Goals - 06/30/18 1108      PT SHORT TERM GOAL #1   Title  PT to be able to single leg stance for 10 seconds on each leg to  feel confident walking without his cane.     Time  2    Period  Weeks    Status  On-going      PT SHORT TERM GOAL #2   Title  PT back pain to be no greater than a 2/10 to be able to stand for 15 minutes to perform self grooming tasks with ease.     Time  3    Period  Weeks    Status  On-going      PT SHORT TERM GOAL #3   Title  PT LE muscular strength to be at least 4/5 B to be able to walk for 15 minutes without stopping.     Time  3    Period  Weeks    Status  On-going        PT Long Term Goals - 06/30/18 1109      PT LONG TERM GOAL #1   Title  PT to be able to stand for 25 minutes without increased back or leg pain to be able to socialize     Time  6    Period  Weeks    Status  On-going      PT LONG TERM GOAL #2   Title  PT to be able to walk for 30 minutes without difficulty to be able to play golf in comfort.     Time  6    Period  Weeks    Status  On-going      PT LONG TERM GOAL #3   Title  Pt strength to be at least 4+/5 to be able to ascend and descend 2 flights of steps without increased back or leg pain.    Time  6    Period  Weeks    Status  On-going            Plan - 07/06/18 1202    Clinical Impression  Statement  continued with core and LE strengthening.  Progressed to prone execises with cues for form and stab.  Able to increase to 2 sets with most exercises as well.  Began standing balance activities with ability to maintain tandem for full 30" each LE lead and max of 10" with SLS.  Instructed with alternative hamstring stretch in standing with noted tightness bilaterally.  Pt reported no issues or pain at end of session.  No new exercises added to HEP today.    Rehab Potential  Good    PT Frequency  2x / week    PT Duration  6 weeks    PT Treatment/Interventions  ADLs/Self Care Home Management;Patient/family education;Manual techniques;Therapeutic activities;Therapeutic exercise;Functional mobility training;Dry needling    PT Next Visit Plan  Add manual PRN for pain and restrictions. Next session begin lunges and vectors.     PT Home Exercise Plan  knee to chest,  active hamstring stretch, scapular and cervical retraction, and tband for anterior tib.    Consulted and Agree with Plan of Care  Patient       Patient will benefit from skilled therapeutic intervention in order to improve the following deficits and impairments:  Decreased activity tolerance, Decreased balance, Decreased range of motion, Decreased strength, Improper body mechanics, Pain  Visit Diagnosis: Radiculopathy, lumbar region  Muscle weakness (generalized)     Problem List Patient Active Problem List   Diagnosis Date Noted  . OSA on CPAP 04/09/2015  . RBBB plus LA hemiblock 03/21/2013  . OVERWEIGHT 07/22/2009  . HYPERLIPIDEMIA 01/09/2009  .  Essential hypertension 01/09/2009  . CAD 01/09/2009   Teena Irani, PTA/CLT [phone]  Teena Irani 07/06/2018, 12:05 PM  [place] [address] [place], [place], [address] Phone: [phone]   Fax:  [phone]  Name: Joshua Gentry MRN: [id number] Date of Birth: Jul 23, 1946

## 2018-07-08 ENCOUNTER — Encounter (HOSPITAL_COMMUNITY): Payer: Self-pay | Admitting: Physical Therapy

## 2018-07-08 ENCOUNTER — Ambulatory Visit (HOSPITAL_COMMUNITY): Payer: Medicare Other | Admitting: Physical Therapy

## 2018-07-08 DIAGNOSIS — M5416 Radiculopathy, lumbar region: Secondary | ICD-10-CM

## 2018-07-08 DIAGNOSIS — M6281 Muscle weakness (generalized): Secondary | ICD-10-CM

## 2018-07-08 NOTE — Therapy (Signed)
[place], [place], [address] Phone: [phone]   Fax:  [phone]  Physical Therapy Treatment  Patient Details  Name: Joshua Gentry MRN: [id number] Date of Birth: [birth year] Referring Provider (PT): Consuella Lose   Encounter Date: 07/08/2018  PT End of Session - 07/08/18 1355    Visit Number  4    Number of Visits  12    Date for PT Re-Evaluation  07/28/18    Authorization Type  medicare    PT Start Time  1350    PT Stop Time  1429    PT Time Calculation (min)  39 min    Activity Tolerance  Patient tolerated treatment well    Behavior During Therapy  [facility] for tasks assessed/performed       Past Medical History:  Diagnosis Date  . 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 Coronary artery disease    a. s/p CABG 1997.  Marland Kitchen Heart attack ([place])   . Hyperlipidemia   . Hypertension   . Obesity (BMI 30-39.9)   . OSA on CPAP March 2016   Compliant  . RBBB with left anterior fascicular block     Past Surgical History:  Procedure Laterality Date  . COLONOSCOPY N/A 08/01/2013   Procedure: COLONOSCOPY;  Surgeon: Jamesetta So, MD;  [facility];  Service: Gastroenterology;  Laterality: N/A;  . CORONARY ARTERY BYPASS GRAFT     x4  . Lens Bilateral April 2016   Replacment   . LOWER LEG SOFT TISSUE TUMOR EXCISION      There were no vitals filed for this visit.  Subjective Assessment - 07/08/18 1354    Subjective  Patient reported that he is not having any pain this session.     Currently in Pain?  No/denies                       [facility] Adult PT Treatment/Exercise - 07/08/18 0001      Exercises   Exercises  Lumbar      Lumbar Exercises: Stretches   Active Hamstring Stretch  2 reps;20 seconds;Limitations    Active Hamstring Stretch Limitations  Supine with sheet      Lumbar Exercises: Standing   Forward Lunge  10 reps    Forward Lunge Limitations  On 4'' step with  abdominal set    Row  Strengthening;Right;Left;10 reps    Theraband Level (Row)  Level 2 (Red)    Shoulder Extension  Strengthening;Right;Left;10 reps    Theraband Level (Shoulder Extension)  Level 2 (Red)    Other Standing Lumbar Exercises  Sit to stands from standard height chair x 10 without UE    Other Standing Lumbar Exercises  Hip extension x 10 with bilateral HHA      Lumbar Exercises: Supine   Clam  10 reps    Clam Limitations  with core stab    Bridge  10 reps;Limitations    Bridge Limitations  2 sets of 10      Lumbar Exercises: Sidelying   Hip Abduction  Both;10 reps;Limitations   2 sets of 10   Hip Abduction Limitations  with manual cues for form      Lumbar Exercises: Prone   Straight Leg Raise  10 reps          Balance Exercises - 07/08/18 1415      Balance Exercises: Standing   Tandem Stance  Eyes  open;30 secs    SLS with Vectors  Solid surface;Other reps (comment)   10 repetitions 3 second holds forward side and back       PT Education - 07/08/18 1354    Education Details  Discussed purpose and technique of interventions.     Person(s) Educated  Patient    Methods  Explanation    Comprehension  Verbalized understanding       PT Short Term Goals - 06/30/18 1108      PT SHORT TERM GOAL #1   Title  PT to be able to single leg stance for 10 seconds on each leg to feel confident walking without his cane.     Time  2    Period  Weeks    Status  On-going      PT SHORT TERM GOAL #2   Title  PT back pain to be no greater than a 2/10 to be able to stand for 15 minutes to perform self grooming tasks with ease.     Time  3    Period  Weeks    Status  On-going      PT SHORT TERM GOAL #3   Title  PT LE muscular strength to be at least 4/5 B to be able to walk for 15 minutes without stopping.     Time  3    Period  Weeks    Status  On-going        PT Long Term Goals - 06/30/18 1109      PT LONG TERM GOAL #1   Title  PT to be able to stand for 25  minutes without increased back or leg pain to be able to socialize     Time  6    Period  Weeks    Status  On-going      PT LONG TERM GOAL #2   Title  PT to be able to walk for 30 minutes without difficulty to be able to play golf in comfort.     Time  6    Period  Weeks    Status  On-going      PT LONG TERM GOAL #3   Title  Pt strength to be at least 4+/5 to be able to ascend and descend 2 flights of steps without increased back or leg pain.    Time  6    Period  Weeks    Status  On-going            Plan - 07/08/18 1432    Clinical Impression Statement  This session continued with established plan of care. This session progressed patient with balance exercises by having patient perform vector stance for improved hip stability. This session also added lunges with verbal cues and demonstration on how to perform properly with abdominal engagement and also had patient perform sit to stands without upper extremity assistance for functional lower extremity strengthening. Patient would benefit from continued skilled physical therapy in order to continue progressing patient towards functional goals.     Rehab Potential  Good    PT Frequency  2x / week    PT Duration  6 weeks    PT Treatment/Interventions  ADLs/Self Care Home Management;Patient/family education;Manual techniques;Therapeutic activities;Therapeutic exercise;Functional mobility training;Dry needling    PT Next Visit Plan  Add manual PRN for pain and restrictions. Next session begin functional squatting.     PT Home Exercise Plan  knee to chest,  active hamstring stretch, scapular  and cervical retraction, and tband for anterior tib.    Consulted and Agree with Plan of Care  Patient       Patient will benefit from skilled therapeutic intervention in order to improve the following deficits and impairments:  Decreased activity tolerance, Decreased balance, Decreased range of motion, Decreased strength, Improper body mechanics,  Pain  Visit Diagnosis: Radiculopathy, lumbar region  Muscle weakness (generalized)     Problem List Patient Active Problem List   Diagnosis Date Noted  . OSA on CPAP 04/09/2015  . RBBB plus LA hemiblock 03/21/2013  . OVERWEIGHT 07/22/2009  . HYPERLIPIDEMIA 01/09/2009  . Essential hypertension 01/09/2009  . CAD 01/09/2009   Clarene Critchley PT, DPT 2:34 PM, 07/08/18 [place], [place], [address] Phone: [phone]   Fax:  [phone]  Name: CARLOUS OLIVARES MRN: [id number] Date of Birth: 12/14/1945

## 2018-07-12 ENCOUNTER — Ambulatory Visit (HOSPITAL_COMMUNITY): Payer: Medicare Other | Admitting: Physical Therapy

## 2018-07-12 DIAGNOSIS — M6281 Muscle weakness (generalized): Secondary | ICD-10-CM | POA: Diagnosis not present

## 2018-07-12 DIAGNOSIS — M5416 Radiculopathy, lumbar region: Secondary | ICD-10-CM

## 2018-07-12 NOTE — Therapy (Signed)
[place], [place], [address] Phone: [phone]   Fax:  [phone]  Physical Therapy Treatment  Patient Details  Name: Joshua Gentry MRN: [id number] Date of Birth: 28-Aug-1945 Referring Provider (PT): Consuella Lose   Encounter Date: 07/12/2018  PT End of Session - 07/12/18 1316    Visit Number  5    Number of Visits  12    Date for PT Re-Evaluation  07/28/18    Authorization Type  medicare    PT Start Time  1300    PT Stop Time  1340    PT Time Calculation (min)  40 min    Activity Tolerance  Patient tolerated treatment well    Behavior During Therapy  [facility] for tasks assessed/performed       Past Medical History:  Diagnosis Date  . 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 Coronary artery disease    a. s/p CABG 1997.  Marland Kitchen Heart attack ([place])   . Hyperlipidemia   . Hypertension   . Obesity (BMI 30-39.9)   . OSA on CPAP March 2016   Compliant  . RBBB with left anterior fascicular block     Past Surgical History:  Procedure Laterality Date  . COLONOSCOPY N/A 08/01/2013   Procedure: COLONOSCOPY;  Surgeon: Jamesetta So, MD;  [facility];  Service: Gastroenterology;  Laterality: N/A;  . CORONARY ARTERY BYPASS GRAFT     x4  . Lens Bilateral April 2016   Replacment   . LOWER LEG SOFT TISSUE TUMOR EXCISION      There were no vitals filed for this visit.  Subjective Assessment - 07/12/18 1300    Subjective  Mr. Hurtado states that he is doing the exercises twice a day.      Pertinent History  OA, HTN, CABG, RT cancer with three surgeries the latest being 5 years ago    Limitations  Lifting;Standing;Walking    How long can you sit comfortably?  no problem    How long can you stand comfortably?  max 10 minutes     How long can you walk comfortably?  using a cane able to walk 5-10 minutes     Patient Stated Goals  less pain     Currently in Pain?  --   Soreness   Pain Onset  More  than a month ago                       [facility] Adult PT Treatment/Exercise - 07/12/18 1327      Exercises   Exercises  Lumbar      Lumbar Exercises: Stretches   Active Hamstring Stretch  Right;Left;3 reps;30 seconds    Single Knee to Chest Stretch  Right;Left;3 reps;30 seconds    Lower Trunk Rotation  5 reps    Prone on Elbows Stretch  3 reps;20 seconds    Other Lumbar Stretch Exercise  3D hip excursion x 3       Lumbar Exercises: Standing   Heel Raises  10 reps    Functional Squats  10 reps    Forward Lunge  10 reps    Row  Strengthening;Right;Left;10 reps    Theraband Level (Row)  Level 3 (Green)    Shoulder Extension  Strengthening;10 reps;Theraband    Theraband Level (Shoulder Extension)  Level 3 (Green)    Other Standing Lumbar Exercises  SLS, tandem stance x 3 each  Other Standing Lumbar Exercises  hip extension/abduction blue tband x 10 each       Lumbar Exercises: Seated   Sit to Stand  10 reps               PT Short Term Goals - 06/30/18 1108      PT SHORT TERM GOAL #1   Title  PT to be able to single leg stance for 10 seconds on each leg to feel confident walking without his cane.     Time  2    Period  Weeks    Status  On-going      PT SHORT TERM GOAL #2   Title  PT back pain to be no greater than a 2/10 to be able to stand for 15 minutes to perform self grooming tasks with ease.     Time  3    Period  Weeks    Status  On-going      PT SHORT TERM GOAL #3   Title  PT LE muscular strength to be at least 4/5 B to be able to walk for 15 minutes without stopping.     Time  3    Period  Weeks    Status  On-going        PT Long Term Goals - 06/30/18 1109      PT LONG TERM GOAL #1   Title  PT to be able to stand for 25 minutes without increased back or leg pain to be able to socialize     Time  6    Period  Weeks    Status  On-going      PT LONG TERM GOAL #2   Title  PT to be able to walk for 30 minutes without difficulty to  be able to play golf in comfort.     Time  6    Period  Weeks    Status  On-going      PT LONG TERM GOAL #3   Title  Pt strength to be at least 4+/5 to be able to ascend and descend 2 flights of steps without increased back or leg pain.    Time  6    Period  Weeks    Status  On-going            Plan - 07/12/18 1316    Clinical Impression Statement  This session added balance into pt strengthening and flexibiltiy program.  Added exercise per flowsheet with good form after verbal cuing     Rehab Potential  Good    PT Frequency  2x / week    PT Duration  6 weeks    PT Treatment/Interventions  ADLs/Self Care Home Management;Patient/family education;Manual techniques;Therapeutic activities;Therapeutic exercise;Functional mobility training;Dry needling    PT Next Visit Plan  Continue balance activities; side stepping with tband ; ;mini reassess.   PT Home Exercise Plan  knee to chest,  active hamstring stretch, scapular and cervical retraction, and tband for anterior tib.    Consulted and Agree with Plan of Care  Patient       Patient will benefit from skilled therapeutic intervention in order to improve the following deficits and impairments:  Decreased activity tolerance, Decreased balance, Decreased range of motion, Decreased strength, Improper body mechanics, Pain  Visit Diagnosis: Radiculopathy, lumbar region  Muscle weakness (generalized)     Problem List Patient Active Problem List   Diagnosis Date Noted  . OSA on CPAP 04/09/2015  .  RBBB plus LA hemiblock 03/21/2013  . OVERWEIGHT 07/22/2009  . HYPERLIPIDEMIA 01/09/2009  . Essential hypertension 01/09/2009  . CAD 01/09/2009    Rayetta Humphrey, PT CLT [phone] 07/12/2018, 1:45 PM  [place] [address] [place], [place], [address] Phone: [phone]   Fax:  [phone]  Name: Joshua Gentry MRN: [id number] Date of Birth: [birth year]

## 2018-07-19 ENCOUNTER — Encounter (HOSPITAL_COMMUNITY): Payer: Medicare Other | Admitting: Physical Therapy

## 2018-07-19 DIAGNOSIS — C499 Malignant neoplasm of connective and soft tissue, unspecified: Secondary | ICD-10-CM | POA: Diagnosis not present

## 2018-07-19 DIAGNOSIS — J439 Emphysema, unspecified: Secondary | ICD-10-CM | POA: Diagnosis not present

## 2018-07-19 DIAGNOSIS — Z483 Aftercare following surgery for neoplasm: Secondary | ICD-10-CM | POA: Diagnosis not present

## 2018-07-20 ENCOUNTER — Ambulatory Visit (HOSPITAL_COMMUNITY): Payer: Medicare Other | Attending: Neurosurgery | Admitting: Physical Therapy

## 2018-07-20 DIAGNOSIS — M5416 Radiculopathy, lumbar region: Secondary | ICD-10-CM | POA: Insufficient documentation

## 2018-07-20 DIAGNOSIS — M6281 Muscle weakness (generalized): Secondary | ICD-10-CM | POA: Insufficient documentation

## 2018-07-20 NOTE — Therapy (Signed)
[place], [place], [address] Phone: [phone]   Fax:  [phone]  Physical Therapy Treatment  Patient Details  Name: Joshua Gentry MRN: [id number] Date of Birth: 11-30-1945 Referring Provider (PT): Consuella Lose   Encounter Date: 07/20/2018  PT End of Session - 07/20/18 1058    Visit Number  6    Number of Visits  12    Date for PT Re-Evaluation  07/28/18    Authorization Type  medicare    PT Start Time  6629    PT Stop Time  1122    PT Time Calculation (min)  38 min    Activity Tolerance  Patient tolerated treatment well    Behavior During Therapy  [facility] for tasks assessed/performed       Past Medical History:  Diagnosis Date  . 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 Coronary artery disease    a. s/p CABG 1997.  Marland Kitchen Heart attack ([place])   . Hyperlipidemia   . Hypertension   . Obesity (BMI 30-39.9)   . OSA on CPAP March 2016   Compliant  . RBBB with left anterior fascicular block     Past Surgical History:  Procedure Laterality Date  . COLONOSCOPY N/A 08/01/2013   Procedure: COLONOSCOPY;  Surgeon: Jamesetta So, MD;  [facility];  Service: Gastroenterology;  Laterality: N/A;  . CORONARY ARTERY BYPASS GRAFT     x4  . Lens Bilateral April 2016   Replacment   . LOWER LEG SOFT TISSUE TUMOR EXCISION      There were no vitals filed for this visit.  Subjective Assessment - 07/20/18 1044    Subjective  PT states that his back feels a little weak today; He went to [facility] yesterday and had to walk around quite a bit.     Pertinent History  OA, HTN, CABG, RT cancer with three surgeries the latest being 5 years ago    Limitations  Lifting;Standing;Walking    How long can you sit comfortably?  no problem    How long can you stand comfortably?  max 10 minutes     How long can you walk comfortably?  using a cane able to walk 5-10 minutes     Patient Stated Goals  less pain      Currently in Pain?  No/denies    Pain Onset  More than a month ago                       [facility] Adult PT Treatment/Exercise - 07/20/18 0001      Exercises   Exercises  Lumbar      Lumbar Exercises: Stretches   Passive Hamstring Stretch  Right;Left;3 reps;30 seconds    Other Lumbar Stretch Exercise  3D hip excursion x 3       Lumbar Exercises: Aerobic   Nustep  L3 x 6:00   no charge      Lumbar Exercises: Standing   Heel Raises  10 reps    Functional Squats  10 reps    Forward Lunge  10 reps    Row  Strengthening;Right;Left;15 reps    Theraband Level (Row)  Level 3 (Green)    Shoulder Extension  Strengthening;15 reps;Theraband    Theraband Level (Shoulder Extension)  Level 3 (Green)    Other Standing Lumbar Exercises  Y lift off from wall x 10; wall push up x 10  Other Standing Lumbar Exercises  hip extension/abduction blue tband x 10 each       Lumbar Exercises: Seated   Sit to Stand  10 reps               PT Short Term Goals - 06/30/18 1108      PT SHORT TERM GOAL #1   Title  PT to be able to single leg stance for 10 seconds on each leg to feel confident walking without his cane.     Time  2    Period  Weeks    Status  On-going      PT SHORT TERM GOAL #2   Title  PT back pain to be no greater than a 2/10 to be able to stand for 15 minutes to perform self grooming tasks with ease.     Time  3    Period  Weeks    Status  On-going      PT SHORT TERM GOAL #3   Title  PT LE muscular strength to be at least 4/5 B to be able to walk for 15 minutes without stopping.     Time  3    Period  Weeks    Status  On-going        PT Long Term Goals - 06/30/18 1109      PT LONG TERM GOAL #1   Title  PT to be able to stand for 25 minutes without increased back or leg pain to be able to socialize     Time  6    Period  Weeks    Status  On-going      PT LONG TERM GOAL #2   Title  PT to be able to walk for 30 minutes without difficulty to be able  to play golf in comfort.     Time  6    Period  Weeks    Status  On-going      PT LONG TERM GOAL #3   Title  Pt strength to be at least 4+/5 to be able to ascend and descend 2 flights of steps without increased back or leg pain.    Time  6    Period  Weeks    Status  On-going            Plan - 07/20/18 1059    Clinical Impression Statement  Added wall pushup and Y lift off to program to increase strength as well as increasing reps on selected exercises.  Pt has noted improved form with exercises.     Rehab Potential  Good    PT Frequency  2x / week    PT Duration  6 weeks    PT Treatment/Interventions  ADLs/Self Care Home Management;Patient/family education;Manual techniques;Therapeutic activities;Therapeutic exercise;Functional mobility training;Dry needling    PT Next Visit Plan  continue with Progressive strengthening     PT Home Exercise Plan  knee to chest,  active hamstring stretch, scapular and cervical retraction, and tband for anterior tib.    Consulted and Agree with Plan of Care  Patient       Patient will benefit from skilled therapeutic intervention in order to improve the following deficits and impairments:  Decreased activity tolerance, Decreased balance, Decreased range of motion, Decreased strength, Improper body mechanics, Pain  Visit Diagnosis: Radiculopathy, lumbar region  Muscle weakness (generalized)     Problem List Patient Active Problem List   Diagnosis Date Noted  . OSA on CPAP  04/09/2015  . RBBB plus LA hemiblock 03/21/2013  . OVERWEIGHT 07/22/2009  . HYPERLIPIDEMIA 01/09/2009  . Essential hypertension 01/09/2009  . CAD 01/09/2009    Rayetta Humphrey, PT CLT [phone] 07/20/2018, 11:24 AM  [facility] [address] [place], [place], [address] Phone: [phone]   Fax:  [phone]  Name: BRENDEN RUDMAN MRN: [id number] Date of Birth: [birth year]

## 2018-07-21 ENCOUNTER — Other Ambulatory Visit: Payer: Self-pay

## 2018-07-21 ENCOUNTER — Ambulatory Visit (HOSPITAL_COMMUNITY): Payer: Medicare Other | Admitting: Physical Therapy

## 2018-07-21 ENCOUNTER — Encounter (HOSPITAL_COMMUNITY): Payer: Medicare Other | Admitting: Physical Therapy

## 2018-07-21 DIAGNOSIS — M5416 Radiculopathy, lumbar region: Secondary | ICD-10-CM | POA: Diagnosis not present

## 2018-07-21 DIAGNOSIS — M6281 Muscle weakness (generalized): Secondary | ICD-10-CM

## 2018-07-21 MED ORDER — POTASSIUM CHLORIDE CRYS ER 20 MEQ PO TBCR: 40.0000 meq | EXTENDED_RELEASE_TABLET | Freq: Every day | ORAL | 3 refills | Status: DC

## 2018-07-21 NOTE — Telephone Encounter (Signed)
refilled potassium per fax request

## 2018-07-21 NOTE — Therapy (Addendum)
[place], [place], [address] Phone: [phone]   Fax:  [phone]  Physical Therapy Treatment  Patient Details  Name: Joshua Gentry MRN: [id number] Date of Birth: 1946/05/02 Referring Provider (PT): Consuella Lose   Encounter Date: 07/21/2018    PT End of Session - 07/21/18 1000   Visit Number  7    Number of Visits  12    Date for PT Re-Evaluation  07/28/18    Authorization Type  medicare    PT Start Time  0945   PT Stop Time  1030    PT Time Calculation (min)  45 min    Activity Tolerance  Patient tolerated treatment well    Behavior During Therapy  [facility] for tasks assessed/performed       Past Medical History:  Diagnosis Date  . 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 Coronary artery disease    a. s/p CABG 1997.  Marland Kitchen Heart attack ([place])   . Hyperlipidemia   . Hypertension   . Obesity (BMI 30-39.9)   . OSA on CPAP March 2016   Compliant  . RBBB with left anterior fascicular block     Past Surgical History:  Procedure Laterality Date  . COLONOSCOPY N/A 08/01/2013   Procedure: COLONOSCOPY;  Surgeon: Jamesetta So, MD;  [facility];  Service: Gastroenterology;  Laterality: N/A;  . CORONARY ARTERY BYPASS GRAFT     x4  . Lens Bilateral April 2016   Replacment   . LOWER LEG SOFT TISSUE TUMOR EXCISION      There were no vitals filed for this visit.  Subjective Assessment - 07/21/18 0955    Subjective  PT states he is sore and his LE's feel weak like he's worked them.  No other issues or pain today.     Currently in Pain?  No/denies                       [facility] Adult PT Treatment/Exercise - 07/21/18 0001      Lumbar Exercises: Stretches   Passive Hamstring Stretch  Right;Left;3 reps;30 seconds;Limitations    Passive Hamstring Stretch Limitations  standing with 12"    Other Lumbar Stretch Exercise  3D hip excursion x 3  10 reps each      Lumbar Exercises:  Aerobic   Nustep  L3 x 6:00   end of session, n/c     Lumbar Exercises: Standing   Heel Raises  15 reps    Functional Squats  10 reps    Forward Lunge  15 reps;Limitations    Forward Lunge Limitations  2" // platform, no UE's    Row  Strengthening;Right;Left;15 reps    Theraband Level (Row)  Level 3 (Green)    Shoulder Extension  Strengthening;15 reps;Theraband    Theraband Level (Shoulder Extension)  Level 3 (Green)    Other Standing Lumbar Exercises  Y lift off from wall x 10; wall push up x 10     Other Standing Lumbar Exercises  hip diagonals/extension/abduction blue tband x 10 each       Lumbar Exercises: Seated   Sit to Stand  10 reps               PT Short Term Goals - 06/30/18 1108      PT SHORT TERM GOAL #1   Title  PT to be able to single leg stance for 10 seconds  on each leg to feel confident walking without his cane.     Time  2    Period  Weeks    Status  On-going      PT SHORT TERM GOAL #2   Title  PT back pain to be no greater than a 2/10 to be able to stand for 15 minutes to perform self grooming tasks with ease.     Time  3    Period  Weeks    Status  On-going      PT SHORT TERM GOAL #3   Title  PT LE muscular strength to be at least 4/5 B to be able to walk for 15 minutes without stopping.     Time  3    Period  Weeks    Status  On-going        PT Long Term Goals - 06/30/18 1109      PT LONG TERM GOAL #1   Title  PT to be able to stand for 25 minutes without increased back or leg pain to be able to socialize     Time  6    Period  Weeks    Status  On-going      PT LONG TERM GOAL #2   Title  PT to be able to walk for 30 minutes without difficulty to be able to play golf in comfort.     Time  6    Period  Weeks    Status  On-going      PT LONG TERM GOAL #3   Title  Pt strength to be at least 4+/5 to be able to ascend and descend 2 flights of steps without increased back or leg pain.    Time  6    Period  Weeks    Status  On-going             Plan - 07/21/18 1117    Clinical Impression Statement  continued with established POC improving postural strength and overall stability.  Improving form with therex requiring less cues to complete.   Able to increase reps of some exercises and no complaints or issues noted.     Rehab Potential  Good    PT Frequency  2x / week    PT Duration  6 weeks    PT Treatment/Interventions  ADLs/Self Care Home Management;Patient/family education;Manual techniques;Therapeutic activities;Therapeutic exercise;Functional mobility training;Dry needling    PT Next Visit Plan  continue with Progressive strengthening.  Begin step ups/downs next session.    PT Home Exercise Plan  knee to chest,  active hamstring stretch, scapular and cervical retraction, and tband for anterior tib.    Consulted and Agree with Plan of Care  Patient       Patient will benefit from skilled therapeutic intervention in order to improve the following deficits and impairments:  Decreased activity tolerance, Decreased balance, Decreased range of motion, Decreased strength, Improper body mechanics, Pain  Visit Diagnosis: Radiculopathy, lumbar region  Muscle weakness (generalized)     Problem List Patient Active Problem List   Diagnosis Date Noted  . OSA on CPAP 04/09/2015  . RBBB plus LA hemiblock 03/21/2013  . OVERWEIGHT 07/22/2009  . HYPERLIPIDEMIA 01/09/2009  . Essential hypertension 01/09/2009  . CAD 01/09/2009   Joshua Gentry, PTA/CLT [phone]  Joshua Gentry 07/21/2018, 11:19 AM  [place], [place], [address] Phone: [phone]   Fax:  [phone]  Name:  Joshua Gentry MRN: [id number] Date of Birth: 1945-09-27

## 2018-07-22 ENCOUNTER — Ambulatory Visit (INDEPENDENT_AMBULATORY_CARE_PROVIDER_SITE_OTHER): Payer: Medicare Other | Admitting: Cardiovascular Disease

## 2018-07-22 ENCOUNTER — Encounter: Payer: Self-pay | Admitting: Cardiovascular Disease

## 2018-07-22 VITALS — BP 126/72 | HR 85 | Ht 73.0 in | Wt 272.0 lb

## 2018-07-22 DIAGNOSIS — I25708 Atherosclerosis of coronary artery bypass graft(s), unspecified, with other forms of angina pectoris: Secondary | ICD-10-CM | POA: Diagnosis not present

## 2018-07-22 DIAGNOSIS — I10 Essential (primary) hypertension: Secondary | ICD-10-CM

## 2018-07-22 DIAGNOSIS — E782 Mixed hyperlipidemia: Secondary | ICD-10-CM | POA: Diagnosis not present

## 2018-07-22 NOTE — Patient Instructions (Signed)
Medication Instructions:  Your physician recommends that you continue on your current medications as directed. Please refer to the Current Medication list given to you today.  If you need a refill on your cardiac medications before your next appointment, please call your pharmacy.   Lab work: Get fasting lipid lab work If you have labs (blood work) drawn today and your tests are completely normal, you will receive your results only by: Marland Kitchen MyChart Message (if you have MyChart) OR . A paper copy in the mail If you have any lab test that is abnormal or we need to change your treatment, we will call you to review the results.  Testing/Procedures: Your physician has requested that you have a lexiscan myoview. For further information please visit [email or web address]. Please follow instruction sheet, as given.   Follow-Up: At [facility], you and your health needs are our priority.  As part of our continuing mission to provide you with exceptional heart care, we have created designated Provider Care Teams.  These Care Teams include your primary Cardiologist (physician) and Advanced Practice Providers (APPs -  Physician Assistants and Nurse Practitioners) who all work together to provide you with the care you need, when you need it. You will need a follow up appointment in 1 years.  Please call our office 2 months in advance to schedule this appointment.  You may see Kate Sable, MD or one of the following Advanced Practice Providers on your designated Care Team:   Bernerd Pho, PA-C [facility]) . Ermalinda Barrios, PA-C ([place])  Any Other Special Instructions Will Be Listed Below (If Applicable). None

## 2018-07-22 NOTE — Progress Notes (Signed)
SUBJECTIVE: The patient presents for follow-up of coronary artery disease.  He underwent CABG in 1997.  He also has hypertension and sleep apnea and uses CPAP.  The patient denies any symptoms of chest pain, palpitations, shortness of breath, lightheadedness, dizziness, leg swelling, orthopnea, PND, and syncope.  He had been having issues with sciatica and received a back injection about a month ago.  He is doing better from the standpoint.  ECG performed in the office today which I ordered and personally interpreted demonstrates normal sinus rhythm with right bundle branch block, left anterior fascicular block, and PVCs.   Review of Systems: As per "subjective", otherwise negative.  Allergies  Allergen Reactions  . Niacin   . Penicillins     Current Outpatient Medications  Medication Sig Dispense Refill  . amLODipine (NORVASC) 10 MG tablet Take 1 tablet (10 mg total) by mouth daily. KEEP OV. 90 tablet 0  . aspirin 81 MG tablet Take 81 mg by mouth daily.    . calcium-vitamin D 250-100 MG-UNIT per tablet Take 1 tablet by mouth 2 (two) times daily.      . cholecalciferol (VITAMIN D) 1000 units tablet Take 1,000 Units by mouth daily.    . Coenzyme Q10 (CO Q 10) 100 MG CAPS Take 100 mg by mouth daily.      Marland Kitchen glucosamine-chondroitin 500-400 MG tablet Take 1 tablet by mouth daily.     . hydrochlorothiazide (HYDRODIURIL) 25 MG tablet TAKE ONE TABLET BY MOUTH ONCE DAILY 90 tablet 3  . losartan (COZAAR) 100 MG tablet TAKE 1 TABLET BY MOUTH ONCE DAILY 90 tablet 3  . Magnesium 400 MG CAPS Take 400 mg by mouth daily. (Patient taking differently: Take 800 mg by mouth daily. ) 90 capsule 3  . metoprolol tartrate (LOPRESSOR) 100 MG tablet TAKE ONE TABLET BY MOUTH TWICE DAILY 180 tablet 1  . Multiple Vitamins-Minerals (MULTIVITAMIN WITH MINERALS) tablet Take 1 tablet by mouth daily.      Marland Kitchen NITROSTAT 0.4 MG SL tablet DISSOLVE ONE TABLET UNDER THE TONGUE EVERY 5 MINUTES AS NEEDED FOR CHEST  PAIN.  DO NOT EXCEED A TOTAL OF 3 DOSES IN 15 MINUTES 25 tablet 3  . potassium chloride SA (KLOR-CON M20) 20 MEQ tablet Take 2 tablets (40 mEq total) by mouth daily. 180 tablet 3  . rosuvastatin (CRESTOR) 10 MG tablet TAKE 1 TABLET BY MOUTH ONCE DAILY 90 tablet 1  . spironolactone (ALDACTONE) 25 MG tablet TAKE 1 TABLET BY MOUTH ONCE DAILY 90 tablet 0  . spironolactone (ALDACTONE) 25 MG tablet TAKE 1 TABLET BY MOUTH ONCE DAILY 90 tablet 0   No current facility-administered medications for this visit.     Past Medical History:  Diagnosis Date  . 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 Coronary artery disease    a. s/p CABG 1997.  Marland Kitchen Heart attack ([place])   . Hyperlipidemia   . Hypertension   . Obesity (BMI 30-39.9)   . OSA on CPAP March 2016   Compliant  . RBBB with left anterior fascicular block     Past Surgical History:  Procedure Laterality Date  . COLONOSCOPY N/A 08/01/2013   Procedure: COLONOSCOPY;  Surgeon: Jamesetta So, MD;  Location[facility]O [facility];  Service: Gastroenterology;  Laterality: N/A;  . CORONARY ARTERY BYPASS GRAFT     x4  . Lens Bilateral April 2016   Replacment   . LOWER LEG SOFT TISSUE TUMOR EXCISION  Social History   Socioeconomic History  . Marital status: Married    Spouse name: Not on file  . Number of children: 3  . Years of education: College  . Highest education level: Not on file  Occupational History  . Occupation: Dance movement psychotherapist  Social Needs  . Financial resource strain: Not on file  . Food insecurity:    Worry: Not on file    Inability: Not on file  . Transportation needs:    Medical: Not on file    Non-medical: Not on file  Tobacco Use  . Smoking status: Former Smoker    Packs/day: 1.50    Years: 32.00    Pack years: 48.00    Types: Cigarettes    Last attempt to quit: 08/17/1996    Years since quitting: 21.9  . Smokeless tobacco: Never Used  Substance and Sexual Activity  . Alcohol use: Yes    Alcohol/week: 0.0  standard drinks    Comment: Occasional   . Drug use: No  . Sexual activity: Not on file  Lifestyle  . Physical activity:    Days per week: Not on file    Minutes per session: Not on file  . Stress: Not on file  Relationships  . Social connections:    Talks on phone: Not on file    Gets together: Not on file    Attends religious service: Not on file    Active member of club or organization: Not on file    Attends meetings of clubs or organizations: Not on file    Relationship status: Not on file  . Intimate partner violence:    Fear of current or ex partner: Not on file    Emotionally abused: Not on file    Physically abused: Not on file    Forced sexual activity: Not on file  Other Topics Concern  . Not on file  Social History Narrative   Married with 3 children   No regular exercise   Drinks about 48oz of coke a day      Vitals:   07/22/18 0834  BP: 126/72  Pulse: 85  SpO2: 90%  Weight: 272 lb (123.4 kg)  Height: 6\' 1"  (1.854 m)    Wt Readings from Last 3 Encounters:  07/22/18 272 lb (123.4 kg)  09/14/17 259 lb (117.5 kg)  06/21/17 266 lb (120.7 kg)     PHYSICAL EXAM General: NAD HEENT: Normal. Neck: No JVD, no thyromegaly. Lungs: Clear to auscultation bilaterally with normal respiratory effort. CV: Regular rate and rhythm, normal S1/S2, no S3/S4, no murmur. No pretibial or periankle edema.  No carotid bruit.   Abdomen: Soft, nontender, no distention.  Neurologic: Alert and oriented.  Psych: Normal affect. Skin: Normal. Musculoskeletal: No gross deformities.    ECG: Reviewed above under Subjective   Labs: Lab Results  Component Value Date/Time   K 4.3 07/01/2017 08:33 AM   BUN 18 07/01/2017 08:33 AM   CREATININE 0.88 07/01/2017 08:33 AM   ALT 18 10/07/2015 07:33 AM   TSH 1.69 10/07/2015 07:33 AM   HGB 15.9 10/07/2015 07:33 AM     Lipids: Lab Results  Component Value Date/Time   LDLCALC 69 10/07/2015 07:33 AM   CHOL 135 10/07/2015 07:33  AM   TRIG 143 10/07/2015 07:33 AM   HDL 37 (L) 10/07/2015 07:33 AM       ASSESSMENT AND PLAN:  1. CAD/CABG: Symptomatically stable. ContinueASA,beta blocker,and Crestor. CABG was in 1997 and last stress test was  in 2009.  I will obtain a screening surveillance stress test [facility] Myoview given some difficulty walking due to residual sciatica).  2. Essential SWF:UXNAT pressure is controlled.  No changes to therapy.  3. Hyperlipidemia: I will check lipids.  Continue Crestor.   Disposition: Follow up 1 year   Kate Sable, M.D., F.A.C.C.

## 2018-07-25 ENCOUNTER — Telehealth: Payer: Self-pay | Admitting: *Deleted

## 2018-07-25 DIAGNOSIS — E782 Mixed hyperlipidemia: Secondary | ICD-10-CM

## 2018-07-25 LAB — LIPID PANEL
Cholesterol: 195 mg/dL (ref <200)
HDL: 41 mg/dL (ref >40)
LDL Cholesterol (Calc): 127 mg/dL (calc) — ABNORMAL HIGH
Non-HDL Cholesterol (Calc): 154 mg/dL (calc) — ABNORMAL HIGH (ref <130)
Total CHOL/HDL Ratio: 4.8 (calc) (ref <5.0)
Triglycerides: 154 mg/dL — ABNORMAL HIGH (ref <150)

## 2018-07-25 MED ORDER — ROSUVASTATIN CALCIUM 20 MG PO TABS: 20.0000 mg | ORAL_TABLET | Freq: Every day | ORAL | 3 refills | Status: DC

## 2018-07-25 NOTE — Telephone Encounter (Signed)
-----   Message from Herminio Commons, MD sent at 07/25/2018  2:01 PM EST ----- LDL is markedly elevated.  Increase rosuvastatin to 20 mg and repeat lipids in 3 months.

## 2018-07-26 ENCOUNTER — Ambulatory Visit (HOSPITAL_COMMUNITY): Payer: Medicare Other | Admitting: Physical Therapy

## 2018-07-26 DIAGNOSIS — M5416 Radiculopathy, lumbar region: Secondary | ICD-10-CM

## 2018-07-26 DIAGNOSIS — M6281 Muscle weakness (generalized): Secondary | ICD-10-CM | POA: Diagnosis not present

## 2018-07-26 NOTE — Therapy (Signed)
[facility], [place], [address] Phone: [phone]   Fax:  [phone]  Physical Therapy Treatment  Patient Details  Name: Joshua Gentry MRN: [id number] Date of Birth: 1945-12-01 Referring Provider (PT): Consuella Lose   Encounter Date: 07/26/2018  PT End of Session - 07/26/18 1638    Visit Number  8    Number of Visits  12    Date for PT Re-Evaluation  07/28/18    Authorization Type  medicare    PT Start Time  0904    PT Stop Time  0945    PT Time Calculation (min)  41 min    Activity Tolerance  Patient tolerated treatment well    Behavior During Therapy  Advanced Pain Management for tasks assessed/performed       Past Medical History:  Diagnosis Date  . 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 Coronary artery disease    a. s/p CABG 1997.  Marland Kitchen Heart attack ([place])   . Hyperlipidemia   . Hypertension   . Obesity (BMI 30-39.9)   . OSA on CPAP March 2016   Compliant  . RBBB with left anterior fascicular block     Past Surgical History:  Procedure Laterality Date  . COLONOSCOPY N/A 08/01/2013   Procedure: COLONOSCOPY;  Surgeon: Jamesetta So, MD;  Location: [facility];  Service: Gastroenterology;  Laterality: N/A;  . CORONARY ARTERY BYPASS GRAFT     x4  . Lens Bilateral April 2016   Replacment   . LOWER LEG SOFT TISSUE TUMOR EXCISION      There were no vitals filed for this visit.  Subjective Assessment - 07/26/18 0909    Subjective  Pt states he is doing well today    Currently in Pain?  No/denies                       [facility] Adult PT Treatment/Exercise - 07/26/18 0001      Lumbar Exercises: Stretches   Passive Hamstring Stretch  Right;Left;3 reps;30 seconds;Limitations    Passive Hamstring Stretch Limitations  standing with 12"    Other Lumbar Stretch Exercise  3D hip excursion x 3  10 reps each      Lumbar Exercises: Aerobic   Nustep  L3 x 6:00   EOS; not included in time or billing      Lumbar Exercises: Standing   Heel Raises  15 reps    Functional Squats  15 reps    Forward Lunge  15 reps;Limitations    Forward Lunge Limitations  2" // platform, no UE's    Row  Strengthening;Right;Left;15 reps;Limitations    Theraband Level (Row)  Level 3 (Green)    Row Limitations  2 sets of 15    Shoulder Extension  Strengthening;15 reps;Theraband;Other (comment);Limitations    Theraband Level (Shoulder Extension)  Level 3 (Green)    Shoulder Extension Limitations  2 sets of 15    Other Standing Lumbar Exercises  Y lift off from wall x 15; wall push up x 15     Other Standing Lumbar Exercises  hip diagonals/extension/abduction blue tband x 15 each , sidestep down line 1RT               PT Short Term Goals - 06/30/18 1108      PT SHORT TERM GOAL #1   Title  PT to be able to single leg stance for 10 seconds on each  leg to feel confident walking without his cane.     Time  2    Period  Weeks    Status  On-going      PT SHORT TERM GOAL #2   Title  PT back pain to be no greater than a 2/10 to be able to stand for 15 minutes to perform self grooming tasks with ease.     Time  3    Period  Weeks    Status  On-going      PT SHORT TERM GOAL #3   Title  PT LE muscular strength to be at least 4/5 B to be able to walk for 15 minutes without stopping.     Time  3    Period  Weeks    Status  On-going        PT Long Term Goals - 06/30/18 1109      PT LONG TERM GOAL #1   Title  PT to be able to stand for 25 minutes without increased back or leg pain to be able to socialize     Time  6    Period  Weeks    Status  On-going      PT LONG TERM GOAL #2   Title  PT to be able to walk for 30 minutes without difficulty to be able to play golf in comfort.     Time  6    Period  Weeks    Status  On-going      PT LONG TERM GOAL #3   Title  Pt strength to be at least 4+/5 to be able to ascend and descend 2 flights of steps without increased back or leg pain.    Time  6     Period  Weeks    Status  On-going            Plan - 07/26/18 3329    Clinical Impression Statement  focus remains on improving overall stablity, functional and postural strength.  able to increase to 2 sets of theraband exercises and complete additional reps on most other exercises.  Pt did not require rest break this session and had no complaints.  minimal cues needed and vastly improved form with squats.     Rehab Potential  Good    PT Frequency  2x / week    PT Duration  6 weeks    PT Treatment/Interventions  ADLs/Self Care Home Management;Patient/family education;Manual techniques;Therapeutic activities;Therapeutic exercise;Functional mobility training;Dry needling    PT Next Visit Plan  continue with Progressive strengthening.  Begin step ups/downs next session.    PT Home Exercise Plan  knee to chest,  active hamstring stretch, scapular and cervical retraction, and tband for anterior tib.    Consulted and Agree with Plan of Care  Patient       Patient will benefit from skilled therapeutic intervention in order to improve the following deficits and impairments:  Decreased activity tolerance, Decreased balance, Decreased range of motion, Decreased strength, Improper body mechanics, Pain  Visit Diagnosis: Radiculopathy, lumbar region  Muscle weakness (generalized)     Problem List Patient Active Problem List   Diagnosis Date Noted  . OSA on CPAP 04/09/2015  . RBBB plus LA hemiblock 03/21/2013  . OVERWEIGHT 07/22/2009  . HYPERLIPIDEMIA 01/09/2009  . Essential hypertension 01/09/2009  . CAD 01/09/2009   Joshua Gentry, PTA/CLT [phone]  Roseanne Reno B 07/26/2018, 9:45 AM  Joshua Gentry  [place], [place], [address] Phone: [phone]   Fax:  [phone]  Name: Joshua Gentry MRN: [id number] Date of Birth: 07/04/1946

## 2018-07-28 ENCOUNTER — Ambulatory Visit (HOSPITAL_COMMUNITY): Payer: Medicare Other | Admitting: Physical Therapy

## 2018-07-28 ENCOUNTER — Encounter (HOSPITAL_COMMUNITY): Payer: Self-pay | Admitting: Physical Therapy

## 2018-07-28 DIAGNOSIS — M5416 Radiculopathy, lumbar region: Secondary | ICD-10-CM | POA: Diagnosis not present

## 2018-07-28 DIAGNOSIS — M6281 Muscle weakness (generalized): Secondary | ICD-10-CM

## 2018-07-28 NOTE — Patient Instructions (Signed)
Single Leg - Eyes Open    Holding support, lift right leg while maintaining balance over other leg. Progress to removing hands from support surface for longer periods of time. Hold_10-15 seconds___ seconds. Repeat __10__ times per session. Do _1___ sessions per day.  Copyright  VHI. All rights reserved.  (Home) Squat: (Assist)    Using supports, arms close to body, squat by dropping hips back as if sitting in a chair. Repeat _10___ times per set. Do ___1_ sets per session. Do __7__ sessions per week.  Copyright  VHI. All rights reserved.

## 2018-07-28 NOTE — Therapy (Signed)
[facility] [address] [place], [place], [address] Phone: [phone]   Fax:  [phone]  Physical Therapy Treatment / Progress note / Discharge Summary  Patient Details  Name: Joshua Gentry MRN: [id number] Date of Birth: September 27, 1945 Referring Provider (PT): Consuella Lose   Encounter Date: 07/28/2018   Progress Note Reporting Period 06/28/18 to 07/28/18  See note below for Objective Data and Assessment of Progress/Goals.     PHYSICAL THERAPY DISCHARGE SUMMARY  Visits from Start of Care: 9  Current functional level related to goals / functional outcomes: See below   Remaining deficits: See below   Education / Equipment: See below Plan: Patient agrees to discharge.  Patient goals were partially met. Patient is being discharged due to being pleased with the current functional level.  ????? and meeting the majority of his rehab goals             PT End of Session - 07/28/18 1058    Visit Number  9    Number of Visits  12    Date for PT Re-Evaluation  07/28/18    Authorization Type  medicare    PT Start Time  1032    PT Stop Time  1055    PT Time Calculation (min)  23 min    Activity Tolerance  Patient tolerated treatment well    Behavior During Therapy  WFL for tasks assessed/performed       Past Medical History:  Diagnosis Date  . 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 Coronary artery disease    a. s/p CABG 1997.  Marland Kitchen Heart attack ([place])   . Hyperlipidemia   . Hypertension   . Obesity (BMI 30-39.9)   . OSA on CPAP March 2016   Compliant  . RBBB with left anterior fascicular block     Past Surgical History:  Procedure Laterality Date  . COLONOSCOPY N/A 08/01/2013   Procedure: COLONOSCOPY;  Surgeon: Jamesetta So, MD;  Location: [facility];  Service: Gastroenterology;  Laterality: N/A;  . CORONARY ARTERY BYPASS GRAFT     x4  . Lens Bilateral April 2016   Replacment   . LOWER LEG SOFT  TISSUE TUMOR EXCISION      There were no vitals filed for this visit.  Subjective Assessment - 07/28/18 1034    Subjective  Pt states he is doing well today. Stated he feels like he has made good progress with therapy.     How long can you sit comfortably?  no problem    How long can you stand comfortably?  40 minutes    How long can you walk comfortably?  30 minutes without cane    Currently in Pain?  No/denies         [facility] PT Assessment - 07/28/18 0001      Assessment   Medical Diagnosis  lumbar stenosis    Referring Provider (PT)  [place] residence    Home Access  Stairs to enter    Entrance Stairs-Number of Steps  4      Observation/Other Assessments   Focus on Therapeutic Outcomes (FOTO)   83%    was 40%     Functional Tests   Functional tests  Single leg stance;Sit to Stand      Single Leg Stance   Comments  Rt 7 seconds left 6 seconds  Strength   Right Hip Flexion  5/5   was 5   Right Hip Extension  4+/5   was 4-   Right Hip ABduction  4+/5   was 3-   Left Hip Flexion  5/5   was 5   Left Hip Extension  4+/5   was 3+   Left Hip ABduction  4+/5   was 5   Right Knee Flexion  5/5   was 4-   Right Knee Extension  5/5   was 5   Left Knee Flexion  5/5   was 4-   Left Knee Extension  5/5   was 5   Right Ankle Dorsiflexion  4+/5   was 3   Right Ankle Plantar Flexion  4+/5   was 2+   Left Ankle Dorsiflexion  4+/5   was 3+   Left Ankle Plantar Flexion  4+/5   was 2+     Flexibility   Soft Tissue Assessment /Muscle Length  yes    Hamstrings  RT 150                           PT Education - 07/28/18 1057    Education Details  Discussed examination findings, updated HEP, and plan for discharge.     Person(s) Educated  Patient    Methods  Explanation;Handout    Comprehension  Verbalized understanding       PT Short Term Goals - 07/28/18 1059      PT SHORT  TERM GOAL #1   Title  PT to be able to single leg stance for 10 seconds on each leg to feel confident walking without his cane.     Baseline  07/28/18: See objective measures    Time  2    Period  Weeks    Status  On-going      PT SHORT TERM GOAL #2   Title  PT back pain to be no greater than a 2/10 to be able to stand for 15 minutes to perform self grooming tasks with ease.     Time  3    Period  Weeks    Status  Achieved      PT SHORT TERM GOAL #3   Title  PT LE muscular strength to be at least 4/5 B to be able to walk for 15 minutes without stopping.     Time  3    Period  Weeks    Status  Achieved        PT Long Term Goals - 07/28/18 1100      PT LONG TERM GOAL #1   Title  PT to be able to stand for 25 minutes without increased back or leg pain to be able to socialize     Time  6    Period  Weeks    Status  Achieved      PT LONG TERM GOAL #2   Title  PT to be able to walk for 30 minutes without difficulty to be able to play golf in comfort.     Time  6    Period  Weeks    Status  Achieved      PT LONG TERM GOAL #3   Title  Pt strength to be at least 4+/5 to be able to ascend and descend 2 flights of steps without increased back or leg pain.    Time  6    Period  Weeks    Status  Achieved            Plan - 07/28/18 1103    Clinical Impression Statement  This session performed a re-assessment of patient's progress towards goals. Patient achieved 2 out of 3 short term goals. Patient achieved 3 out of 3 long term goals. Patient has demonstrated improvement in strength, functional mobility, and reported decreased pain. Patient is being discharged at this time as he has met the majority of his goals and is pleased with his current functional status. The remainder of the session reviewed patient's HEP, updated HEP, and educated patient about the local gym and senior center.     Rehab Potential  Good    PT Frequency  2x / week    PT Duration  6 weeks    PT  Treatment/Interventions  ADLs/Self Care Home Management;Patient/family education;Manual techniques;Therapeutic activities;Therapeutic exercise;Functional mobility training;Dry needling    PT Next Visit Plan  Discharged    PT Home Exercise Plan  knee to chest,  active hamstring stretch, scapular and cervical retraction, and tband for anterior tib.    Consulted and Agree with Plan of Care  Patient       Patient will benefit from skilled therapeutic intervention in order to improve the following deficits and impairments:  Decreased activity tolerance, Decreased balance, Decreased range of motion, Decreased strength, Improper body mechanics, Pain  Visit Diagnosis: Radiculopathy, lumbar region  Muscle weakness (generalized)     Problem List Patient Active Problem List   Diagnosis Date Noted  . OSA on CPAP 04/09/2015  . RBBB plus LA hemiblock 03/21/2013  . OVERWEIGHT 07/22/2009  . HYPERLIPIDEMIA 01/09/2009  . Essential hypertension 01/09/2009  . CAD 01/09/2009   Clarene Critchley PT, DPT 11:08 AM, 07/28/18 [facility] [address] [place], [place], [address] Phone: [phone]   Fax:  [phone]  Name: Joshua Gentry MRN: [id number] Date of Birth: August 02, 1946

## 2018-08-01 ENCOUNTER — Ambulatory Visit (HOSPITAL_COMMUNITY)
Admission: RE | Admit: 2018-08-01 | Discharge: 2018-08-01 | Disposition: A | Discharge status: Home / Self Care | Payer: Medicare Other | Source: Ambulatory Visit | Attending: Cardiovascular Disease | Admitting: Cardiovascular Disease

## 2018-08-01 ENCOUNTER — Encounter (HOSPITAL_COMMUNITY): Payer: Medicare Other

## 2018-08-01 DIAGNOSIS — I25708 Atherosclerosis of coronary artery bypass graft(s), unspecified, with other forms of angina pectoris: Secondary | ICD-10-CM | POA: Insufficient documentation

## 2018-08-01 LAB — NM MYOCAR MULTI W/SPECT W/WALL MOTION / EF
CHL CUP NUCLEAR SDS: 0
CHL CUP NUCLEAR SRS: 3
LV dias vol: 116 mL (ref 62–150)
LV sys vol: 51 mL
NUC STRESS TID: 1.11
Peak HR: 67 {beats}/min
RATE: 0.71
Rest HR: 53 {beats}/min
SSS: 3

## 2018-08-01 MED ORDER — SODIUM CHLORIDE 0.9% FLUSH
INTRAVENOUS | Status: AC
  Administered 2018-08-01: 10 mL via INTRAVENOUS
  Filled 2018-08-01: qty 10

## 2018-08-01 MED ORDER — TECHNETIUM TC 99M TETROFOSMIN IV KIT
10.0000 | PACK | Freq: Once | INTRAVENOUS | Status: AC | PRN
  Administered 2018-08-01: 10 via INTRAVENOUS

## 2018-08-01 MED ORDER — REGADENOSON 0.4 MG/5ML IV SOLN
INTRAVENOUS | Status: AC
  Administered 2018-08-01: 0.4 mg via INTRAVENOUS
  Filled 2018-08-01: qty 5

## 2018-08-01 MED ORDER — TECHNETIUM TC 99M TETROFOSMIN IV KIT
30.0000 | PACK | Freq: Once | INTRAVENOUS | Status: AC | PRN
  Administered 2018-08-01: 33 via INTRAVENOUS

## 2018-08-02 ENCOUNTER — Encounter (HOSPITAL_COMMUNITY): Payer: Medicare Other | Admitting: Physical Therapy

## 2018-08-04 ENCOUNTER — Telehealth: Payer: Self-pay

## 2018-08-04 ENCOUNTER — Encounter (HOSPITAL_COMMUNITY): Payer: Medicare Other | Admitting: Physical Therapy

## 2018-08-04 NOTE — Telephone Encounter (Signed)
-----   Message from Laurine Blazer, LPN sent at 78/24/2353 12:47 PM EST -----  ----- Message ----- From: Herminio Commons, MD Sent: 08/02/2018  12:20 PM EST To: Laurine Blazer, LPN  Evidence of prior heart attack.  Low risk for new blockages.

## 2018-08-04 NOTE — Telephone Encounter (Signed)
Called pt., no answer. Left message for pt to return call.  

## 2018-08-23 ENCOUNTER — Other Ambulatory Visit: Payer: Self-pay | Admitting: Cardiovascular Disease

## 2018-09-12 ENCOUNTER — Other Ambulatory Visit: Payer: Self-pay | Admitting: Cardiovascular Disease

## 2018-09-15 ENCOUNTER — Ambulatory Visit (INDEPENDENT_AMBULATORY_CARE_PROVIDER_SITE_OTHER): Payer: Medicare Other | Admitting: Adult Health

## 2018-09-15 ENCOUNTER — Encounter: Payer: Self-pay | Admitting: Adult Health

## 2018-09-15 VITALS — BP 132/66 | HR 56 | Ht 73.0 in | Wt 284.4 lb

## 2018-09-15 DIAGNOSIS — Z9989 Dependence on other enabling machines and devices: Secondary | ICD-10-CM | POA: Diagnosis not present

## 2018-09-15 DIAGNOSIS — G4733 Obstructive sleep apnea (adult) (pediatric): Secondary | ICD-10-CM

## 2018-09-15 NOTE — Progress Notes (Addendum)
PATIENT: Joshua Gentry DOB: 04/01/1946  REASON FOR VISIT: follow up HISTORY FROM: patient  HISTORY OF PRESENT ILLNESS: Today 09/15/18  Mr. Surratt is a 73 year old male here for follow-up for OSA using Bipap. His compliance data from 12/30/209-09/13/2018 shows 30/30 days of use at 100%. He had 30 days of > 4 hours of use, indicating excellent compliance with an average usage of 8 hours and 30 minutes. His current inspiratory pressure is 14 cmH2O and expiratory pressure is 9 cmH2O. AHI at goal 0.3 per hour, leak at 5.0. Overall, he is doing well and feels the BiPAP has been helpful. He is not having problems with his equipment. His Epworth sleepiness scale score is 6. He is doing well, and enjoying his retired life. He is keeping busy by babysitting his grandchildren. He presents today for follow-up.   HISTORY  09/14/2017: I reviewed his BiPAP compliance data from 08/14/2017 through 09/12/2017, which is a total of 30 days, during which time he used his BiPAP every night with percent used days greater than 4 hours at 100%, indicating superb compliance with an average usage of 8 hours and 48 minutes, residual AHI at goal at 0.6 per hour, leak high with the 95th percentile at 53.7 L/m on a pressure of 14/9 cm. He has lost some weight, BP good, also on Rx Mg and now on aldactone. Saw cardiology in November 2018. Also saw his oncologist in November, goes yearly now. He has started drinking more water and tries to exercise. He likes to play golf starting in March.   The patient's allergies, current medications, family history, past medical history, past social history, past surgical history and problem list were reviewed and updated as appropriate.He saw his cardiologist in September 2017 and I reviewed the note. He was advised to increase his amlodipine to 10 mg daily, and a one-year follow-up was suggested.  The patient's allergies, current medications, family history, past medical history, past  social history, past surgical history and problem list were reviewed and updated as appropriate.    REVIEW OF SYSTEMS: Out of a complete 14 system review of symptoms, the patient complains only of the following symptoms, and all other reviewed systems are negative.  ALLERGIES: Allergies  Allergen Reactions  . Niacin   . Penicillins     HOME MEDICATIONS: Outpatient Medications Prior to Visit  Medication Sig Dispense Refill  . amLODipine (NORVASC) 10 MG tablet TAKE 1 TABLET BY MOUTH ONCE DAILY. NEEDS OFFICE VISIT. 90 tablet 0  . aspirin 81 MG tablet Take 81 mg by mouth daily.    . calcium-vitamin D 250-100 MG-UNIT per tablet Take 1 tablet by mouth 2 (two) times daily.      . cholecalciferol (VITAMIN D) 1000 units tablet Take 1,000 Units by mouth daily.    . Coenzyme Q10 (CO Q 10) 100 MG CAPS Take 100 mg by mouth daily.      Marland Kitchen glucosamine-chondroitin 500-400 MG tablet Take 1 tablet by mouth daily.     . hydrochlorothiazide (HYDRODIURIL) 25 MG tablet TAKE 1 TABLET BY MOUTH ONCE DAILY 90 tablet 0  . losartan (COZAAR) 100 MG tablet TAKE 1 TABLET BY MOUTH ONCE DAILY 90 tablet 3  . Magnesium 400 MG CAPS Take 400 mg by mouth daily. (Patient taking differently: Take 800 mg by mouth daily. ) 90 capsule 3  . metoprolol tartrate (LOPRESSOR) 100 MG tablet TAKE 1 TABLET BY MOUTH TWICE DAILY 180 tablet 3  . Multiple Vitamins-Minerals (MULTIVITAMIN WITH MINERALS) tablet  Take 1 tablet by mouth daily.      Marland Kitchen NITROSTAT 0.4 MG SL tablet DISSOLVE ONE TABLET UNDER THE TONGUE EVERY 5 MINUTES AS NEEDED FOR CHEST PAIN.  DO NOT EXCEED A TOTAL OF 3 DOSES IN 15 MINUTES 25 tablet 3  . potassium chloride SA (KLOR-CON M20) 20 MEQ tablet Take 2 tablets (40 mEq total) by mouth daily. 180 tablet 3  . rosuvastatin (CRESTOR) 20 MG tablet Take 1 tablet (20 mg total) by mouth daily. 90 tablet 3  . spironolactone (ALDACTONE) 25 MG tablet TAKE 1 TABLET BY MOUTH ONCE DAILY 90 tablet 0  . spironolactone (ALDACTONE) 25 MG tablet  TAKE 1 TABLET BY MOUTH ONCE DAILY 90 tablet 0   No facility-administered medications prior to visit.     PAST MEDICAL HISTORY: Past Medical History:  Diagnosis Date  . 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 Coronary artery disease    a. s/p CABG 1997.  Marland Kitchen Heart attack ([place])   . Hyperlipidemia   . Hypertension   . Obesity (BMI 30-39.9)   . OSA on CPAP March 2016   Compliant  . RBBB with left anterior fascicular block     PAST SURGICAL HISTORY: Past Surgical History:  Procedure Laterality Date  . COLONOSCOPY N/A 08/01/2013   Procedure: COLONOSCOPY;  Surgeon: Jamesetta So, MD;  Location: [facility];  Service: Gastroenterology;  Laterality: N/A;  . CORONARY ARTERY BYPASS GRAFT     x4  . Lens Bilateral April 2016   Replacment   . LOWER LEG SOFT TISSUE TUMOR EXCISION      FAMILY HISTORY: Family History  Problem Relation Age of Onset  . Hypertension Mother   . Diabetes Mother   . Heart attack Father   . Diabetes Brother     SOCIAL HISTORY: Social History   Socioeconomic History  . Marital status: Married    Spouse name: Not on file  . Number of children: 3  . Years of education: College  . Highest education level: Not on file  Occupational History  . Occupation: Dance movement psychotherapist  Social Needs  . Financial resource strain: Not on file  . Food insecurity:    Worry: Not on file    Inability: Not on file  . Transportation needs:    Medical: Not on file    Non-medical: Not on file  Tobacco Use  . Smoking status: Former Smoker    Packs/day: 1.50    Years: 32.00    Pack years: 48.00    Types: Cigarettes    Last attempt to quit: 08/17/1996    Years since quitting: 22.0  . Smokeless tobacco: Never Used  Substance and Sexual Activity  . Alcohol use: Yes    Alcohol/week: 0.0 standard drinks    Comment: Occasional   . Drug use: No  . Sexual activity: Not on file  Lifestyle  . Physical activity:    Days per week: Not on file    Minutes per session:  Not on file  . Stress: Not on file  Relationships  . Social connections:    Talks on phone: Not on file    Gets together: Not on file    Attends religious service: Not on file    Active member of club or organization: Not on file    Attends meetings of clubs or organizations: Not on file    Relationship status: Not on file  . Intimate partner violence:    Fear of current or  ex partner: Not on file    Emotionally abused: Not on file    Physically abused: Not on file    Forced sexual activity: Not on file  Other Topics Concern  . Not on file  Social History Narrative   Married with 3 children   No regular exercise   Drinks about 48oz of coke a day       PHYSICAL EXAM  Vitals:   09/15/18 1310  BP: 132/66  Pulse: (!) 56  Weight: 284 lb 6.4 oz (129 kg)  Height: 6\' 1"  (1.854 m)   Body mass index is 37.52 kg/m.  Generalized: Well developed, in no acute distress  Neck: Circumference 18 inches, Mallampati 2+ Neurological examination  Mentation: Alert oriented to time, place, history taking. Follows all commands speech and language fluent Cranial nerve II-XII: Pupils were equal round reactive to light. Extraocular movements were full, visual field were full on confrontational test. Facial sensation and strength were normal. Uvula tongue midline. Head turning and shoulder shrug  were normal and symmetric. Motor: The motor testing reveals 5 over 5 strength of all 4 extremities. Good symmetric motor tone is noted throughout.  Sensory: Sensory testing is intact to soft touch on all 4 extremities. No evidence of extinction is noted.  Coordination: Cerebellar testing reveals good finger-nose-finger and heel-to-shin bilaterally.  Gait and station: Gait is normal. Romberg is negative..  Reflexes: Deep tendon reflexes are symmetric and normal bilaterally.   DIAGNOSTIC DATA (LABS, IMAGING, TESTING) - I reviewed patient records, labs, notes, testing and imaging myself where  available.  Lab Results  Component Value Date   WBC 7.4 10/07/2015   HGB 15.9 10/07/2015   HCT 47.1 10/07/2015   MCV 84.3 10/07/2015   PLT 186 10/07/2015      Component Value Date/Time   NA 141 07/01/2017 0833   K 4.3 07/01/2017 0833   CL 104 07/01/2017 0833   CO2 30 07/01/2017 0833   GLUCOSE 109 (H) 07/01/2017 0833   BUN 18 07/01/2017 0833   CREATININE 0.88 07/01/2017 0833   CALCIUM 9.3 07/01/2017 0833   PROT 6.1 10/07/2015 0733   ALBUMIN 3.8 10/07/2015 0733   AST 19 10/07/2015 0733   ALT 18 10/07/2015 0733   ALKPHOS 76 10/07/2015 0733   BILITOT 1.4 (H) 10/07/2015 0733   Lab Results  Component Value Date   CHOL 195 07/25/2018   HDL 41 07/25/2018   LDLCALC 127 (H) 07/25/2018   TRIG 154 (H) 07/25/2018   CHOLHDL 4.8 07/25/2018   Lab Results  Component Value Date   HGBA1C 5.6 02/04/2010   No results found for: VITAMINB12 Lab Results  Component Value Date   TSH 1.69 10/07/2015      ASSESSMENT AND PLAN 73 y.o. year old male  has a past medical history of Cancer ([place]) (2006), Coronary artery disease, Heart attack ([place]), Hyperlipidemia, Hypertension, Obesity (BMI 30-39.9), OSA on CPAP (March 2016), and RBBB with left anterior fascicular block. here with:  Obstructive Sleep Apnea on BIPAP  1. The patient's compliance download is excellent. His apnea is well treated. He will continue using his BiPAP nightly. He will let us know if he develops any new symptoms or has any concerns. He will follow-up in 1-year or sooner if needed.    I spent 15 minutes with the patient. 50% of this time was spent discussing his plan of care and reviewing his BiPAP download.   Butler Denmark, AGNP-C, DNP 09/15/2018, 1:30 PM [facility] [address],  [place], [place] [address] [phone]  I reviewed the above note and documentation by the Nurse Practitioner and agree with the history, physical exam, assessment and plan as outlined above. I was immediately  available for face-to-face consultation. Star Age, MD, PhD [facility])

## 2018-10-05 DIAGNOSIS — Z6833 Body mass index (BMI) 33.0-33.9, adult: Secondary | ICD-10-CM | POA: Diagnosis not present

## 2018-10-05 DIAGNOSIS — E6609 Other obesity due to excess calories: Secondary | ICD-10-CM | POA: Diagnosis not present

## 2018-10-05 DIAGNOSIS — J343 Hypertrophy of nasal turbinates: Secondary | ICD-10-CM | POA: Diagnosis not present

## 2018-10-05 DIAGNOSIS — J029 Acute pharyngitis, unspecified: Secondary | ICD-10-CM | POA: Diagnosis not present

## 2018-10-05 DIAGNOSIS — Z1389 Encounter for screening for other disorder: Secondary | ICD-10-CM | POA: Diagnosis not present

## 2018-10-25 ENCOUNTER — Telehealth: Payer: Self-pay | Admitting: *Deleted

## 2018-10-25 DIAGNOSIS — E782 Mixed hyperlipidemia: Secondary | ICD-10-CM | POA: Diagnosis not present

## 2018-10-25 LAB — LIPID PANEL
CHOL/HDL RATIO: 3.3 (calc) (ref <5.0)
Cholesterol: 114 mg/dL (ref <200)
HDL: 35 mg/dL — ABNORMAL LOW (ref >=40)
LDL Cholesterol (Calc): 57 mg/dL (calc)
Non-HDL Cholesterol (Calc): 79 mg/dL (calc) (ref <130)
Triglycerides: 140 mg/dL (ref <150)

## 2018-10-25 NOTE — Telephone Encounter (Signed)
Called patient with test results. No answer. Left message to call back.  

## 2018-10-25 NOTE — Telephone Encounter (Signed)
-----   Message from Herminio Commons, MD sent at 10/25/2018  3:40 PM EDT ----- Good.

## 2018-12-13 ENCOUNTER — Other Ambulatory Visit: Payer: Self-pay | Admitting: Cardiovascular Disease

## 2019-01-02 ENCOUNTER — Other Ambulatory Visit: Payer: Self-pay | Admitting: Cardiovascular Disease

## 2019-01-03 DIAGNOSIS — Z85828 Personal history of other malignant neoplasm of skin: Secondary | ICD-10-CM | POA: Diagnosis not present

## 2019-01-03 DIAGNOSIS — D1801 Hemangioma of skin and subcutaneous tissue: Secondary | ICD-10-CM | POA: Diagnosis not present

## 2019-01-03 DIAGNOSIS — D0472 Carcinoma in situ of skin of left lower limb, including hip: Secondary | ICD-10-CM | POA: Diagnosis not present

## 2019-01-03 DIAGNOSIS — D225 Melanocytic nevi of trunk: Secondary | ICD-10-CM | POA: Diagnosis not present

## 2019-01-03 DIAGNOSIS — L570 Actinic keratosis: Secondary | ICD-10-CM | POA: Diagnosis not present

## 2019-01-03 DIAGNOSIS — L814 Other melanin hyperpigmentation: Secondary | ICD-10-CM | POA: Diagnosis not present

## 2019-01-03 DIAGNOSIS — L821 Other seborrheic keratosis: Secondary | ICD-10-CM | POA: Diagnosis not present

## 2019-01-03 DIAGNOSIS — D2262 Melanocytic nevi of left upper limb, including shoulder: Secondary | ICD-10-CM | POA: Diagnosis not present

## 2019-01-03 DIAGNOSIS — D044 Carcinoma in situ of skin of scalp and neck: Secondary | ICD-10-CM | POA: Diagnosis not present

## 2019-01-11 DIAGNOSIS — Z681 Body mass index (BMI) 19 or less, adult: Secondary | ICD-10-CM | POA: Diagnosis not present

## 2019-01-11 DIAGNOSIS — Z1389 Encounter for screening for other disorder: Secondary | ICD-10-CM | POA: Diagnosis not present

## 2019-01-11 DIAGNOSIS — Z0000 Encounter for general adult medical examination without abnormal findings: Secondary | ICD-10-CM | POA: Diagnosis not present

## 2019-04-18 DIAGNOSIS — Z23 Encounter for immunization: Secondary | ICD-10-CM | POA: Diagnosis not present

## 2019-05-17 DIAGNOSIS — M1991 Primary osteoarthritis, unspecified site: Secondary | ICD-10-CM | POA: Diagnosis not present

## 2019-05-17 DIAGNOSIS — E7849 Other hyperlipidemia: Secondary | ICD-10-CM | POA: Diagnosis not present

## 2019-05-17 DIAGNOSIS — I2510 Atherosclerotic heart disease of native coronary artery without angina pectoris: Secondary | ICD-10-CM | POA: Diagnosis not present

## 2019-05-17 DIAGNOSIS — I10 Essential (primary) hypertension: Secondary | ICD-10-CM | POA: Diagnosis not present

## 2019-05-21 ENCOUNTER — Other Ambulatory Visit: Payer: Self-pay | Admitting: Cardiovascular Disease

## 2019-06-17 ENCOUNTER — Other Ambulatory Visit: Payer: Self-pay | Admitting: Cardiovascular Disease

## 2019-06-27 DIAGNOSIS — C4921 Malignant neoplasm of connective and soft tissue of right lower limb, including hip: Secondary | ICD-10-CM | POA: Diagnosis not present

## 2019-06-27 DIAGNOSIS — C499 Malignant neoplasm of connective and soft tissue, unspecified: Secondary | ICD-10-CM | POA: Diagnosis not present

## 2019-06-27 DIAGNOSIS — Z483 Aftercare following surgery for neoplasm: Secondary | ICD-10-CM | POA: Diagnosis not present

## 2019-07-06 DIAGNOSIS — L814 Other melanin hyperpigmentation: Secondary | ICD-10-CM | POA: Diagnosis not present

## 2019-07-06 DIAGNOSIS — Z85828 Personal history of other malignant neoplasm of skin: Secondary | ICD-10-CM | POA: Diagnosis not present

## 2019-07-06 DIAGNOSIS — D1801 Hemangioma of skin and subcutaneous tissue: Secondary | ICD-10-CM | POA: Diagnosis not present

## 2019-07-06 DIAGNOSIS — L570 Actinic keratosis: Secondary | ICD-10-CM | POA: Diagnosis not present

## 2019-07-06 DIAGNOSIS — D485 Neoplasm of uncertain behavior of skin: Secondary | ICD-10-CM | POA: Diagnosis not present

## 2019-07-06 DIAGNOSIS — L821 Other seborrheic keratosis: Secondary | ICD-10-CM | POA: Diagnosis not present

## 2019-07-17 ENCOUNTER — Other Ambulatory Visit: Payer: Self-pay | Admitting: Cardiovascular Disease

## 2019-07-31 ENCOUNTER — Other Ambulatory Visit: Payer: Self-pay

## 2019-07-31 ENCOUNTER — Ambulatory Visit (INDEPENDENT_AMBULATORY_CARE_PROVIDER_SITE_OTHER): Payer: Medicare Other | Admitting: Cardiovascular Disease

## 2019-07-31 ENCOUNTER — Encounter: Payer: Self-pay | Admitting: Cardiovascular Disease

## 2019-07-31 VITALS — BP 136/73 | HR 90 | Temp 97.8°F | Ht 73.0 in | Wt 282.0 lb

## 2019-07-31 DIAGNOSIS — I25708 Atherosclerosis of coronary artery bypass graft(s), unspecified, with other forms of angina pectoris: Secondary | ICD-10-CM | POA: Diagnosis not present

## 2019-07-31 DIAGNOSIS — E785 Hyperlipidemia, unspecified: Secondary | ICD-10-CM | POA: Diagnosis not present

## 2019-07-31 DIAGNOSIS — I10 Essential (primary) hypertension: Secondary | ICD-10-CM

## 2019-07-31 MED ORDER — NITROGLYCERIN 0.4 MG SL SUBL: SUBLINGUAL_TABLET | SUBLINGUAL | 3 refills | Status: DC

## 2019-07-31 NOTE — Progress Notes (Signed)
SUBJECTIVE: The patient presents for follow-up of coronary artery disease. He underwent CABG in 1997.  He also has hypertension and sleep apnea and uses CPAP.  ECG performed today which I personally view demonstrates sinus rhythm with right bundle branch block and left anterior fascicular block.  The patient denies any symptoms of chest pain, palpitations, shortness of breath, lightheadedness, dizziness, leg swelling, orthopnea, PND, and syncope.  He and his wife babysit their 17 and 110-year-old granddaughter is 1 day/week.  Their daughter-in-law is pregnant.  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  He continues to golf occasionally.   Review of Systems: As per "subjective", otherwise negative.  Allergies  Allergen Reactions  . Niacin   . Penicillins     Current Outpatient Medications  Medication Sig Dispense Refill  . amLODipine (NORVASC) 10 MG tablet Take 1 tablet by mouth once daily 90 tablet 0  . aspirin 81 MG tablet Take 81 mg by mouth daily.    . calcium-vitamin D 250-100 MG-UNIT per tablet Take 1 tablet by mouth 2 (two) times daily.      . cholecalciferol (VITAMIN D) 1000 units tablet Take 1,000 Units by mouth daily.    . Coenzyme Q10 (CO Q 10) 100 MG CAPS Take 100 mg by mouth daily.      Marland Kitchen glucosamine-chondroitin 500-400 MG tablet Take 1 tablet by mouth daily.     . hydrochlorothiazide (HYDRODIURIL) 25 MG tablet Take 1 tablet by mouth once daily 90 tablet 0  . losartan (COZAAR) 100 MG tablet Take 1 tablet by mouth once daily 90 tablet 1  . Magnesium 400 MG CAPS Take 400 mg by mouth daily. (Patient taking differently: Take 800 mg by mouth daily. ) 90 capsule 3  . metoprolol tartrate (LOPRESSOR) 100 MG tablet TAKE 1 TABLET BY MOUTH TWICE DAILY 180 tablet 3  . Multiple Vitamins-Minerals (MULTIVITAMIN WITH MINERALS) tablet Take 1 tablet by mouth daily.      . potassium chloride SA (KLOR-CON) 20 MEQ tablet Take 2 tablets  by mouth once daily 180 tablet 0  . spironolactone (ALDACTONE) 25 MG tablet TAKE 1 TABLET BY MOUTH ONCE DAILY 90 tablet 0  . spironolactone (ALDACTONE) 25 MG tablet Take 1 tablet by mouth once daily 90 tablet 2  . nitroGLYCERIN (NITROSTAT) 0.4 MG SL tablet DISSOLVE ONE TABLET UNDER THE TONGUE EVERY 5 MINUTES AS NEEDED FOR CHEST PAIN.  DO NOT EXCEED A TOTAL OF 3 DOSES IN 15 MINUTES 25 tablet 3  . rosuvastatin (CRESTOR) 20 MG tablet Take 1 tablet (20 mg total) by mouth daily. 90 tablet 3   No current facility-administered medications for this visit.    Past Medical History:  Diagnosis Date  . 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 Coronary artery disease    a. s/p CABG 1997.  Marland Kitchen Heart attack ([place])   . Hyperlipidemia   . Hypertension   . Obesity (BMI 30-39.9)   . OSA on CPAP March 2016   Compliant  . RBBB with left anterior fascicular block     Past Surgical History:  Procedure Laterality Date  . COLONOSCOPY N/A 08/01/2013   Procedure: COLONOSCOPY;  Surgeon: Jamesetta So, MD;  Location[facility];  Service: Gastroenterology;  Laterality: N/A;  . CORONARY ARTERY BYPASS GRAFT     x4  . Lens Bilateral April 2016   Replacment   . LOWER LEG SOFT TISSUE TUMOR EXCISION  Social History   Socioeconomic History  . Marital status: Married    Spouse name: Not on file  . Number of children: 3  . Years of education: College  . Highest education level: Not on file  Occupational History  . Occupation: Dance movement psychotherapist  Tobacco Use  . Smoking status: Former Smoker    Packs/day: 1.50    Years: 32.00    Pack years: 48.00    Types: Cigarettes    Quit date: 08/17/1996    Years since quitting: 22.9  . Smokeless tobacco: Never Used  Substance and Sexual Activity  . Alcohol use: Yes    Alcohol/week: 0.0 standard drinks    Comment: Occasional   . Drug use: No  . Sexual activity: Not on file  Other Topics Concern  . Not on file  Social History Narrative   Married with  3 children   No regular exercise   Drinks about 48oz of coke a day    Social Determinants of Health   Financial Resource Strain:   . Difficulty of Paying Living Expenses: Not on file  Food Insecurity:   . Worried About Charity fundraiser in the Last Year: Not on file  . Ran Out of Food in the Last Year: Not on file  Transportation Needs:   . Lack of Transportation (Medical): Not on file  . Lack of Transportation (Non-Medical): Not on file  Physical Activity:   . Days of Exercise per Week: Not on file  . Minutes of Exercise per Session: Not on file  Stress:   . Feeling of Stress : Not on file  Social Connections:   . Frequency of Communication with Friends and Family: Not on file  . Frequency of Social Gatherings with Friends and Family: Not on file  . Attends Religious Services: Not on file  . Active Member of Clubs or Organizations: Not on file  . Attends Archivist Meetings: Not on file  . Marital Status: Not on file  Intimate Partner Violence:   . Fear of Current or Ex-Partner: Not on file  . Emotionally Abused: Not on file  . Physically Abused: Not on file  . Sexually Abused: Not on file     Vitals:   07/31/19 0858  BP: 136/73  Pulse: 90  Temp: 97.8 F (36.6 C)  SpO2: 94%  Weight: 282 lb (127.9 kg)  Height: 6\' 1"  (1.854 m)    Wt Readings from Last 3 Encounters:  07/31/19 282 lb (127.9 kg)  09/15/18 284 lb 6.4 oz (129 kg)  07/22/18 272 lb (123.4 kg)     PHYSICAL EXAM General: NAD HEENT: Normal. Neck: No JVD, no thyromegaly. Lungs: Clear to auscultation bilaterally with normal respiratory effort. CV: Regular rate and rhythm, normal S1/S2, no S3/S4, no murmur. No pretibial or periankle edema.  No carotid bruit.   Abdomen: Soft, nontender, no distention.  Neurologic: Alert and oriented.  Psych: Normal affect. Skin: Normal. Musculoskeletal: No gross deformities.      Labs: Lab Results  Component Value Date/Time   K 4.3 07/01/2017 08:33  AM   BUN 18 07/01/2017 08:33 AM   CREATININE 0.88 07/01/2017 08:33 AM   ALT 18 10/07/2015 07:33 AM   TSH 1.69 10/07/2015 07:33 AM   HGB 15.9 10/07/2015 07:33 AM     Lipids: Lab Results  Component Value Date/Time   LDLCALC 57 10/25/2018 08:13 AM   CHOL 114 10/25/2018 08:13 AM   TRIG 140 10/25/2018 08:13 AM   HDL  35 (L) 10/25/2018 08:13 AM       ASSESSMENT AND PLAN: 1. CAD/CABG: Symptomatically stable. ContinueASA,beta blockerand Crestor.CABG was in 1997.  Nuclear stress test on 08/01/2018 showed prior infarct and inferior/inferolateral/apical myocardium.  No significant ischemia.  It was a low risk study.  LVEF calculated at 55-65%.  2. Essential [id number] pressure is controlled.  No changes to therapy.  3. Hyperlipidemia:Lipids reviewed above.  LDL at goal in March 2020.  Continue rosuvastatin.    Disposition: Follow up 1 year   Kate Sable, M.D., F.A.C.C.

## 2019-07-31 NOTE — Patient Instructions (Signed)
Medication Instructions:  Your physician recommends that you continue on your current medications as directed. Please refer to the Current Medication list given to you today.  *If you need a refill on your cardiac medications before your next appointment, please call your pharmacy*  Lab Work: None today If you have labs (blood work) drawn today and your tests are completely normal, you will receive your results only by: . MyChart Message (if you have MyChart) OR . A paper copy in the mail If you have any lab test that is abnormal or we need to change your treatment, we will call you to review the results.  Testing/Procedures: None today  Follow-Up: At [facility], you and your health needs are our priority.  As part of our continuing mission to provide you with exceptional heart care, we have created designated Provider Care Teams.  These Care Teams include your primary Cardiologist (physician) and Advanced Practice Providers (APPs -  Physician Assistants and Nurse Practitioners) who all work together to provide you with the care you need, when you need it.  Your next appointment:   12 month(s)  The format for your next appointment:   Virtual Visit   Provider:   Suresh Koneswaran, MD  Other Instructions None     Thank you for choosing [facility] !         

## 2019-08-17 DIAGNOSIS — I10 Essential (primary) hypertension: Secondary | ICD-10-CM | POA: Diagnosis not present

## 2019-08-17 DIAGNOSIS — E7849 Other hyperlipidemia: Secondary | ICD-10-CM | POA: Diagnosis not present

## 2019-08-17 DIAGNOSIS — I25708 Atherosclerosis of coronary artery bypass graft(s), unspecified, with other forms of angina pectoris: Secondary | ICD-10-CM | POA: Diagnosis not present

## 2019-08-28 DIAGNOSIS — D3131 Benign neoplasm of right choroid: Secondary | ICD-10-CM | POA: Diagnosis not present

## 2019-09-07 DIAGNOSIS — Z23 Encounter for immunization: Secondary | ICD-10-CM | POA: Diagnosis not present

## 2019-09-16 ENCOUNTER — Other Ambulatory Visit: Payer: Self-pay | Admitting: Cardiovascular Disease

## 2019-09-17 DIAGNOSIS — I10 Essential (primary) hypertension: Secondary | ICD-10-CM | POA: Diagnosis not present

## 2019-09-17 DIAGNOSIS — E7849 Other hyperlipidemia: Secondary | ICD-10-CM | POA: Diagnosis not present

## 2019-09-17 DIAGNOSIS — I25708 Atherosclerosis of coronary artery bypass graft(s), unspecified, with other forms of angina pectoris: Secondary | ICD-10-CM | POA: Diagnosis not present

## 2019-09-21 ENCOUNTER — Other Ambulatory Visit: Payer: Self-pay

## 2019-09-21 ENCOUNTER — Encounter: Payer: Self-pay | Admitting: Adult Health

## 2019-09-21 ENCOUNTER — Ambulatory Visit (INDEPENDENT_AMBULATORY_CARE_PROVIDER_SITE_OTHER): Payer: Medicare Other | Admitting: Adult Health

## 2019-09-21 VITALS — BP 115/67 | HR 58 | Temp 97.2°F | Ht 73.0 in | Wt 284.2 lb

## 2019-09-21 DIAGNOSIS — Z9989 Dependence on other enabling machines and devices: Secondary | ICD-10-CM

## 2019-09-21 DIAGNOSIS — G4733 Obstructive sleep apnea (adult) (pediatric): Secondary | ICD-10-CM

## 2019-09-21 NOTE — Patient Instructions (Signed)
Continue using BiPAP nightly and greater than 4 hours each night If your symptoms worsen or you develop new symptoms please let us know.    

## 2019-09-21 NOTE — Progress Notes (Addendum)
PATIENT: Joshua Gentry DOB: Oct 01, 1945  REASON FOR VISIT: follow up HISTORY FROM: patient  HISTORY OF PRESENT ILLNESS: Today 09/21/19:  Joshua Gentry is a 74 year old male with a history of obstructive sleep apnea on BiPAP.  His download indicates that he use his machine nightly for compliance of 100%.  He uses machine greater than 4 hours each night.  On average he uses his machine 8 hours and 51 minutes.  His residual AHI is 0.3 on 14/9 centimeters of water.  He does not have a leak at all.  He reports that the BiPAP continues to work well for him.  He returns today for an evaluation.  HISTORY 09/15/18  Joshua Gentry is a 74 year old male here for follow-up for OSA using Bipap. His compliance data from 12/30/209-09/13/2018 shows 30/30 days of use at 100%. He had 30 days of > 4 hours of use, indicating excellent compliance with an average usage of 8 hours and 30 minutes. His current inspiratory pressure is 14 cmH2O and expiratory pressure is 9 cmH2O. AHI at goal 0.3 per hour, leak at 5.0. Overall, he is doing well and feels the BiPAP has been helpful. He is not having problems with his equipment. His Epworth sleepiness scale score is 6. He is doing well, and enjoying his retired life. He is keeping busy by babysitting his grandchildren. He presents today for follow-up.   REVIEW OF SYSTEMS: Out of a complete 14 system review of symptoms, the patient complains only of the following symptoms, and all other reviewed systems are negative.  ESS5 [id number]  ALLERGIES: Allergies  Allergen Reactions  . Niacin   . Penicillins     HOME MEDICATIONS: Outpatient Medications Prior to Visit  Medication Sig Dispense Refill  . rosuvastatin (CRESTOR) 20 MG tablet Take 1 tablet (20 mg total) by mouth daily. 90 tablet 3  . amLODipine (NORVASC) 10 MG tablet Take 1 tablet by mouth once daily 90 tablet 3  . aspirin 81 MG tablet Take 81 mg by mouth daily.    . calcium-vitamin D 250-100 MG-UNIT per tablet Take  1 tablet by mouth 2 (two) times daily.      . cholecalciferol (VITAMIN D) 1000 units tablet Take 1,000 Units by mouth daily.    . Coenzyme Q10 (CO Q 10) 100 MG CAPS Take 100 mg by mouth daily.      Marland Kitchen glucosamine-chondroitin 500-400 MG tablet Take 1 tablet by mouth daily.     . hydrochlorothiazide (HYDRODIURIL) 25 MG tablet Take 1 tablet by mouth once daily 90 tablet 3  . losartan (COZAAR) 100 MG tablet Take 1 tablet by mouth once daily 90 tablet 1  . Magnesium 400 MG CAPS Take 400 mg by mouth daily. 90 capsule 3  . metoprolol tartrate (LOPRESSOR) 100 MG tablet TAKE 1 TABLET BY MOUTH TWICE DAILY 180 tablet 3  . Multiple Vitamins-Minerals (MULTIVITAMIN WITH MINERALS) tablet Take 1 tablet by mouth daily.      . nitroGLYCERIN (NITROSTAT) 0.4 MG SL tablet DISSOLVE ONE TABLET UNDER THE TONGUE EVERY 5 MINUTES AS NEEDED FOR CHEST PAIN.  DO NOT EXCEED A TOTAL OF 3 DOSES IN 15 MINUTES 25 tablet 3  . potassium chloride SA (KLOR-CON) 20 MEQ tablet Take 2 tablets by mouth once daily 180 tablet 0  . spironolactone (ALDACTONE) 25 MG tablet Take 1 tablet by mouth once daily 90 tablet 2  . spironolactone (ALDACTONE) 25 MG tablet TAKE 1 TABLET BY MOUTH ONCE DAILY 90 tablet 0   No  facility-administered medications prior to visit.    PAST MEDICAL HISTORY: Past Medical History:  Diagnosis Date  . 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 Coronary artery disease    a. s/p CABG 1997.  Marland Kitchen Heart attack ([place])   . Hyperlipidemia   . Hypertension   . Obesity (BMI 30-39.9)   . OSA on CPAP March 2016   Compliant  . RBBB with left anterior fascicular block     PAST SURGICAL HISTORY: Past Surgical History:  Procedure Laterality Date  . COLONOSCOPY N/A 08/01/2013   Procedure: COLONOSCOPY;  Surgeon: Jamesetta So, MD;  Location[facility];  Service: Gastroenterology;  Laterality: N/A;  . CORONARY ARTERY BYPASS GRAFT     x4  . Lens Bilateral April 2016   Replacment   . LOWER LEG SOFT TISSUE TUMOR  EXCISION      FAMILY HISTORY: Family History  Problem Relation Age of Onset  . Hypertension Mother   . Diabetes Mother   . Heart attack Father   . Diabetes Brother     SOCIAL HISTORY: Social History   Socioeconomic History  . Marital status: Married    Spouse name: Not on file  . Number of children: 3  . Years of education: College  . Highest education level: Not on file  Occupational History  . Occupation: Dance movement psychotherapist  Tobacco Use  . Smoking status: Former Smoker    Packs/day: 1.50    Years: 32.00    Pack years: 48.00    Types: Cigarettes    Quit date: 08/17/1996    Years since quitting: 23.1  . Smokeless tobacco: Never Used  Substance and Sexual Activity  . Alcohol use: Yes    Alcohol/week: 0.0 standard drinks    Comment: Occasional   . Drug use: No  . Sexual activity: Not on file  Other Topics Concern  . Not on file  Social History Narrative   Married with 3 children   No regular exercise   Drinks about 48oz of coke a day    Social Determinants of Health   Financial Resource Strain:   . Difficulty of Paying Living Expenses: Not on file  Food Insecurity:   . Worried About Charity fundraiser in the Last Year: Not on file  . Ran Out of Food in the Last Year: Not on file  Transportation Needs:   . Lack of Transportation (Medical): Not on file  . Lack of Transportation (Non-Medical): Not on file  Physical Activity:   . Days of Exercise per Week: Not on file  . Minutes of Exercise per Session: Not on file  Stress:   . Feeling of Stress : Not on file  Social Connections:   . Frequency of Communication with Friends and Family: Not on file  . Frequency of Social Gatherings with Friends and Family: Not on file  . Attends Religious Services: Not on file  . Active Member of Clubs or Organizations: Not on file  . Attends Archivist Meetings: Not on file  . Marital Status: Not on file  Intimate Partner Violence:   . Fear of Current or  Ex-Partner: Not on file  . Emotionally Abused: Not on file  . Physically Abused: Not on file  . Sexually Abused: Not on file      PHYSICAL EXAM  Vitals:   09/21/19 1341  BP: 115/67  Pulse: (!) 58  Temp: (!) 97.2 F (36.2 C)  TempSrc: Oral  Weight: 284  lb 3.2 oz (128.9 kg)  Height: 6\' 1"  (1.854 m)   Body mass index is 37.5 kg/m.  Generalized: Well developed, in no acute distress  Chest: Lungs clear to auscultation bilaterally  Neurological examination  Mentation: Alert oriented to time, place, history taking. Follows all commands speech and language fluent Cranial nerve II-XII: Extraocular movements were full, visual field were full on confrontational test Head turning and shoulder shrug  were normal and symmetric. Motor: The motor testing reveals 5 over 5 strength of all 4 extremities. Good symmetric motor tone is noted throughout.  Sensory: Sensory testing is intact to soft touch on all 4 extremities. No evidence of extinction is noted.  Gait and station: Gait is normal.     DIAGNOSTIC DATA (LABS, IMAGING, TESTING) - I reviewed patient records, labs, notes, testing and imaging myself where available.  Lab Results  Component Value Date   WBC 7.4 10/07/2015   HGB 15.9 10/07/2015   HCT 47.1 10/07/2015   MCV 84.3 10/07/2015   PLT 186 10/07/2015      Component Value Date/Time   NA 141 07/01/2017 0833   K 4.3 07/01/2017 0833   CL 104 07/01/2017 0833   CO2 30 07/01/2017 0833   GLUCOSE 109 (H) 07/01/2017 0833   BUN 18 07/01/2017 0833   CREATININE 0.88 07/01/2017 0833   CALCIUM 9.3 07/01/2017 0833   PROT 6.1 10/07/2015 0733   ALBUMIN 3.8 10/07/2015 0733   AST 19 10/07/2015 0733   ALT 18 10/07/2015 0733   ALKPHOS 76 10/07/2015 0733   BILITOT 1.4 (H) 10/07/2015 0733   Lab Results  Component Value Date   CHOL 114 10/25/2018   HDL 35 (L) 10/25/2018   LDLCALC 57 10/25/2018   TRIG 140 10/25/2018   CHOLHDL 3.3 10/25/2018   Lab Results  Component Value Date    HGBA1C 5.6 02/04/2010   No results found for: VITAMINB12 Lab Results  Component Value Date   TSH 1.69 10/07/2015      ASSESSMENT AND PLAN 74 y.o. year old male  has a past medical history of Cancer ([place]) (2006), Coronary artery disease, Heart attack ([place]), Hyperlipidemia, Hypertension, Obesity (BMI 30-39.9), OSA on CPAP (March 2016), and RBBB with left anterior fascicular block. here with :  1. Obstructive sleep apnea on BiPAP  The patient's BiPAP download shows excellent compliance and good treatment of his apnea.  He is encouraged to continue using BiPAP nightly and greater than 4 hours each night.  He is advised that if his symptoms worsen or he develops new symptoms he should let us know.  He will follow-up in 1 year or sooner if needed    I spent 15 minutes with the patient. 50% of this time was spent reviewing download   Ward Givens, MSN, NP-C 09/21/2019, 2:09 PM [facility] [address] [place], [place] [address] [phone]  I reviewed the above note and documentation by the Nurse Practitioner and agree with the history, exam, assessment and plan as outlined above. I was available for consultation. Star Age, MD, PhD [facility])

## 2019-09-30 ENCOUNTER — Other Ambulatory Visit: Payer: Self-pay | Admitting: Cardiovascular Disease

## 2019-10-13 DIAGNOSIS — Z23 Encounter for immunization: Secondary | ICD-10-CM | POA: Diagnosis not present

## 2019-10-14 ENCOUNTER — Other Ambulatory Visit: Payer: Self-pay | Admitting: Cardiovascular Disease

## 2019-10-20 ENCOUNTER — Other Ambulatory Visit: Payer: Self-pay | Admitting: Cardiovascular Disease

## 2019-11-15 DIAGNOSIS — I10 Essential (primary) hypertension: Secondary | ICD-10-CM | POA: Diagnosis not present

## 2019-11-15 DIAGNOSIS — Z87891 Personal history of nicotine dependence: Secondary | ICD-10-CM | POA: Diagnosis not present

## 2019-11-15 DIAGNOSIS — E7849 Other hyperlipidemia: Secondary | ICD-10-CM | POA: Diagnosis not present

## 2019-11-15 DIAGNOSIS — I25708 Atherosclerosis of coronary artery bypass graft(s), unspecified, with other forms of angina pectoris: Secondary | ICD-10-CM | POA: Diagnosis not present

## 2019-11-16 ENCOUNTER — Other Ambulatory Visit: Payer: Self-pay | Admitting: Cardiovascular Disease

## 2019-12-12 ENCOUNTER — Other Ambulatory Visit: Payer: Self-pay | Admitting: Cardiovascular Disease

## 2020-01-04 DIAGNOSIS — L570 Actinic keratosis: Secondary | ICD-10-CM | POA: Diagnosis not present

## 2020-01-04 DIAGNOSIS — Z85828 Personal history of other malignant neoplasm of skin: Secondary | ICD-10-CM | POA: Diagnosis not present

## 2020-01-04 DIAGNOSIS — C44612 Basal cell carcinoma of skin of right upper limb, including shoulder: Secondary | ICD-10-CM | POA: Diagnosis not present

## 2020-01-04 DIAGNOSIS — L821 Other seborrheic keratosis: Secondary | ICD-10-CM | POA: Diagnosis not present

## 2020-01-04 DIAGNOSIS — D2261 Melanocytic nevi of right upper limb, including shoulder: Secondary | ICD-10-CM | POA: Diagnosis not present

## 2020-01-04 DIAGNOSIS — L814 Other melanin hyperpigmentation: Secondary | ICD-10-CM | POA: Diagnosis not present

## 2020-01-04 DIAGNOSIS — D225 Melanocytic nevi of trunk: Secondary | ICD-10-CM | POA: Diagnosis not present

## 2020-01-04 DIAGNOSIS — D1801 Hemangioma of skin and subcutaneous tissue: Secondary | ICD-10-CM | POA: Diagnosis not present

## 2020-01-12 DIAGNOSIS — Z6833 Body mass index (BMI) 33.0-33.9, adult: Secondary | ICD-10-CM | POA: Diagnosis not present

## 2020-01-12 DIAGNOSIS — Z0001 Encounter for general adult medical examination with abnormal findings: Secondary | ICD-10-CM | POA: Diagnosis not present

## 2020-01-12 DIAGNOSIS — E7849 Other hyperlipidemia: Secondary | ICD-10-CM | POA: Diagnosis not present

## 2020-01-12 DIAGNOSIS — Z125 Encounter for screening for malignant neoplasm of prostate: Secondary | ICD-10-CM | POA: Diagnosis not present

## 2020-01-12 DIAGNOSIS — I10 Essential (primary) hypertension: Secondary | ICD-10-CM | POA: Diagnosis not present

## 2020-01-12 DIAGNOSIS — Z1389 Encounter for screening for other disorder: Secondary | ICD-10-CM | POA: Diagnosis not present

## 2020-01-13 ENCOUNTER — Other Ambulatory Visit: Payer: Self-pay | Admitting: Cardiovascular Disease

## 2020-01-15 DIAGNOSIS — Z87891 Personal history of nicotine dependence: Secondary | ICD-10-CM | POA: Diagnosis not present

## 2020-01-15 DIAGNOSIS — I10 Essential (primary) hypertension: Secondary | ICD-10-CM | POA: Diagnosis not present

## 2020-01-15 DIAGNOSIS — E7849 Other hyperlipidemia: Secondary | ICD-10-CM | POA: Diagnosis not present

## 2020-01-15 DIAGNOSIS — I25708 Atherosclerosis of coronary artery bypass graft(s), unspecified, with other forms of angina pectoris: Secondary | ICD-10-CM | POA: Diagnosis not present

## 2020-03-15 DIAGNOSIS — Z87891 Personal history of nicotine dependence: Secondary | ICD-10-CM | POA: Diagnosis not present

## 2020-03-15 DIAGNOSIS — I10 Essential (primary) hypertension: Secondary | ICD-10-CM | POA: Diagnosis not present

## 2020-03-15 DIAGNOSIS — E7849 Other hyperlipidemia: Secondary | ICD-10-CM | POA: Diagnosis not present

## 2020-03-15 DIAGNOSIS — I25708 Atherosclerosis of coronary artery bypass graft(s), unspecified, with other forms of angina pectoris: Secondary | ICD-10-CM | POA: Diagnosis not present

## 2020-04-02 ENCOUNTER — Other Ambulatory Visit: Payer: Self-pay

## 2020-04-02 MED ORDER — SPIRONOLACTONE 25 MG PO TABS: 25.0000 mg | ORAL_TABLET | Freq: Every day | ORAL | 0 refills | Status: DC

## 2020-04-02 NOTE — Telephone Encounter (Signed)
Refilled aldactone, requested labs from pcp

## 2020-06-03 DIAGNOSIS — Z681 Body mass index (BMI) 19 or less, adult: Secondary | ICD-10-CM | POA: Diagnosis not present

## 2020-06-03 DIAGNOSIS — J301 Allergic rhinitis due to pollen: Secondary | ICD-10-CM | POA: Diagnosis not present

## 2020-06-03 DIAGNOSIS — J019 Acute sinusitis, unspecified: Secondary | ICD-10-CM | POA: Diagnosis not present

## 2020-06-06 DIAGNOSIS — Z23 Encounter for immunization: Secondary | ICD-10-CM | POA: Diagnosis not present

## 2020-06-18 IMAGING — XA Imaging study
2 series · 2 of 2 positions shown · non-contrast
Comparison: none

CLINICAL DATA: Lumbosacral spondylosis without myelopathy. Lumbar
stenosis with neurogenic claudication. Severe stenosis at L3-4 and
L4-5.

[Series 1: ortho adipose · 1 of 1 slices shown (1 of 2)]
[im 1/1]
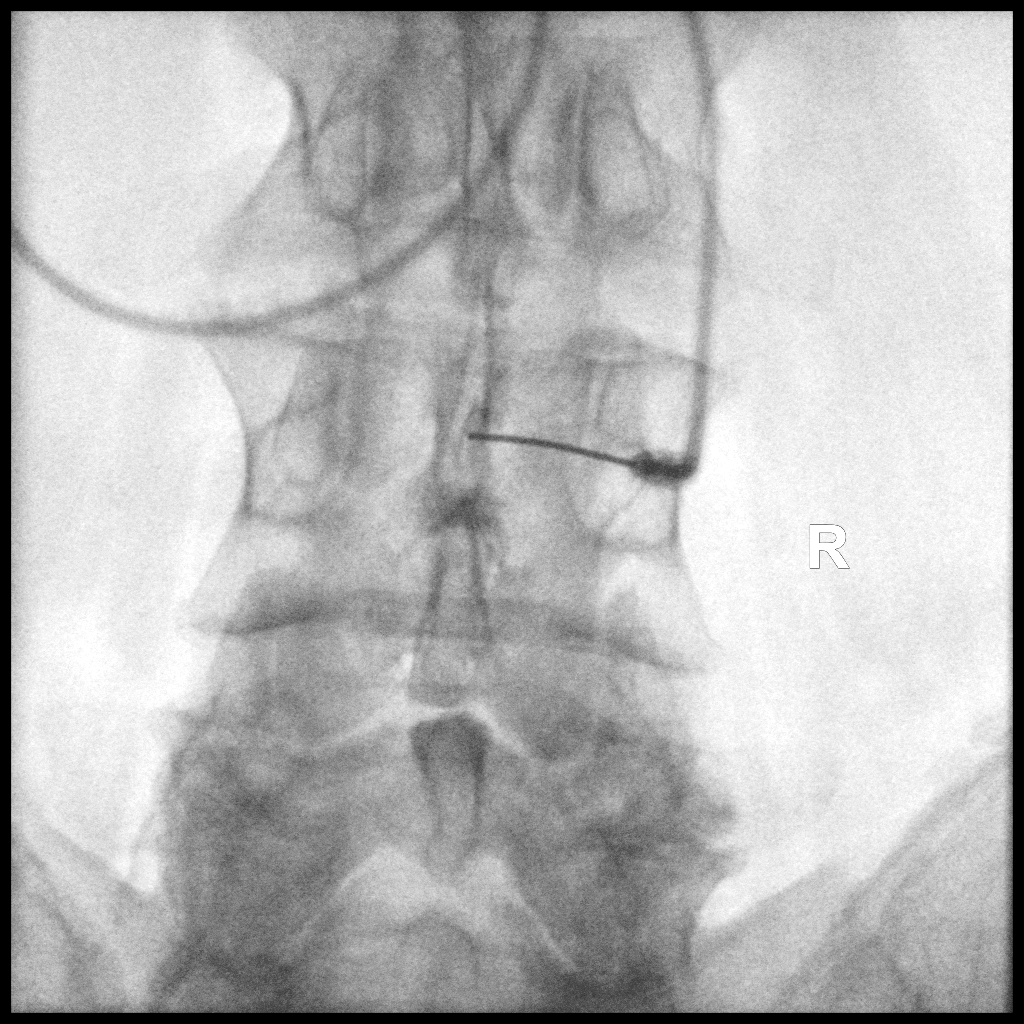

[Series 2: ortho adipose · 1 of 1 slices shown (2 of 2)]
[im 1/1]
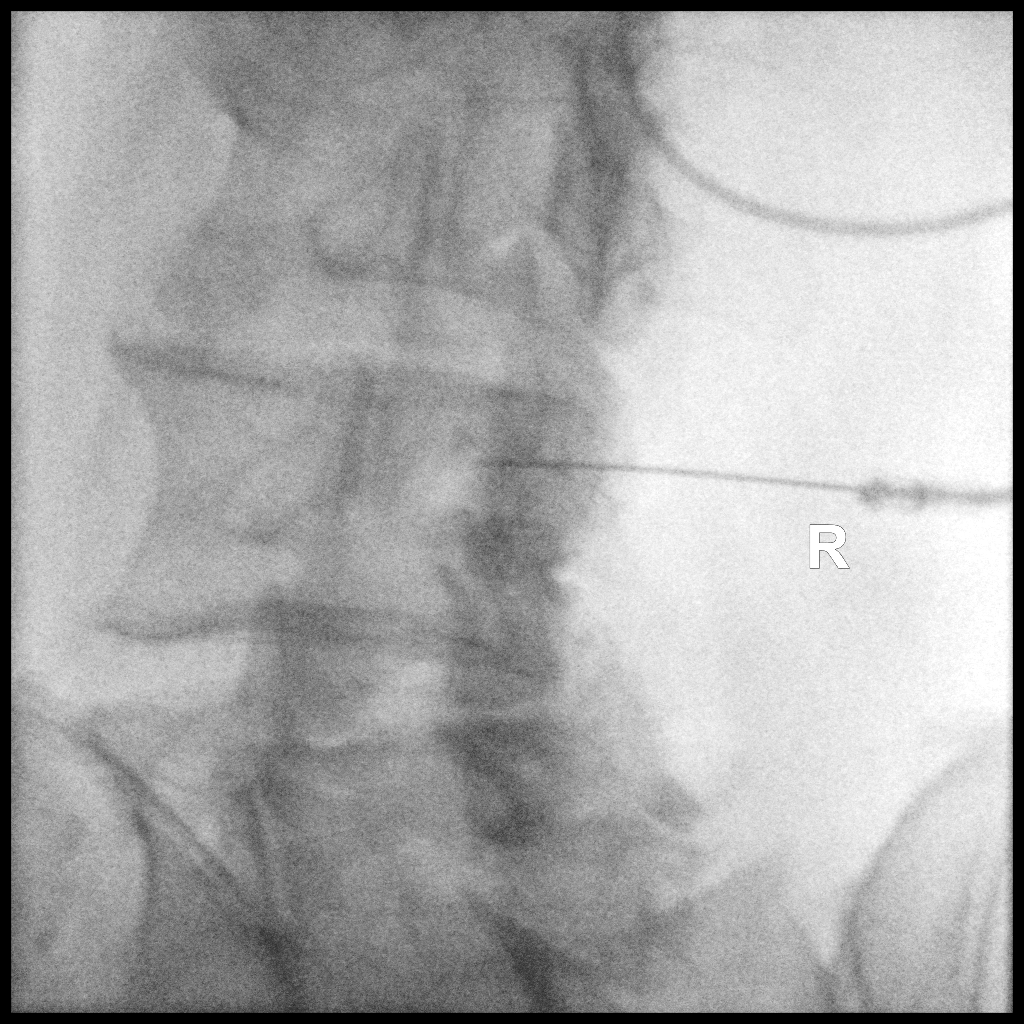

[2 of 2 positions shown; findings below may reference images not displayed]

FLUOROSCOPY TIME:  25 seconds corresponding to a Dose Area Product
of 102 Gy*m2

PROCEDURE:
The procedure, risks, benefits, and alternatives were explained to
the patient. Questions regarding the procedure were encouraged and
answered. The patient understands and consents to the procedure.

LUMBAR EPIDURAL INJECTION:

An interlaminar approach was performed on RIGHT/midline at L3-L4.
The overlying skin was cleansed and anesthetized. A 20 gauge
epidural needle was advanced using loss-of-resistance technique.

DIAGNOSTIC EPIDURAL INJECTION:

Injection of Isovue-M 200 shows a good epidural pattern with spread
above and below the level of needle placement, with BILATERAL
spread. No vascular opacification is seen.

THERAPEUTIC EPIDURAL INJECTION:

120.0 mg of Depo-Medrol mixed with 2 mL 1% lidocaine were instilled.
The procedure was well-tolerated, and the patient was discharged
thirty minutes following the injection in good condition.

COMPLICATIONS:
None
IMPRESSION: Technically successful epidural injection on the RIGHT/midline L3 L
# 1.

## 2020-06-25 DIAGNOSIS — R918 Other nonspecific abnormal finding of lung field: Secondary | ICD-10-CM | POA: Diagnosis not present

## 2020-06-25 DIAGNOSIS — C4921 Malignant neoplasm of connective and soft tissue of right lower limb, including hip: Secondary | ICD-10-CM | POA: Diagnosis not present

## 2020-06-25 DIAGNOSIS — C499 Malignant neoplasm of connective and soft tissue, unspecified: Secondary | ICD-10-CM | POA: Diagnosis not present

## 2020-06-25 DIAGNOSIS — Z483 Aftercare following surgery for neoplasm: Secondary | ICD-10-CM | POA: Diagnosis not present

## 2020-06-25 DIAGNOSIS — Z8589 Personal history of malignant neoplasm of other organs and systems: Secondary | ICD-10-CM | POA: Diagnosis not present

## 2020-06-27 DIAGNOSIS — Z23 Encounter for immunization: Secondary | ICD-10-CM | POA: Diagnosis not present

## 2020-06-29 ENCOUNTER — Other Ambulatory Visit: Payer: Self-pay | Admitting: Student

## 2020-07-02 ENCOUNTER — Encounter: Payer: Self-pay | Admitting: *Deleted

## 2020-07-03 ENCOUNTER — Other Ambulatory Visit: Payer: Self-pay | Admitting: *Deleted

## 2020-07-03 MED ORDER — METOPROLOL TARTRATE 100 MG PO TABS: 100.0000 mg | ORAL_TABLET | Freq: Two times a day (BID) | ORAL | 1 refills | Status: DC

## 2020-07-08 DIAGNOSIS — L814 Other melanin hyperpigmentation: Secondary | ICD-10-CM | POA: Diagnosis not present

## 2020-07-08 DIAGNOSIS — L821 Other seborrheic keratosis: Secondary | ICD-10-CM | POA: Diagnosis not present

## 2020-07-08 DIAGNOSIS — D225 Melanocytic nevi of trunk: Secondary | ICD-10-CM | POA: Diagnosis not present

## 2020-07-08 DIAGNOSIS — Z85828 Personal history of other malignant neoplasm of skin: Secondary | ICD-10-CM | POA: Diagnosis not present

## 2020-07-08 DIAGNOSIS — D1801 Hemangioma of skin and subcutaneous tissue: Secondary | ICD-10-CM | POA: Diagnosis not present

## 2020-07-08 DIAGNOSIS — D2262 Melanocytic nevi of left upper limb, including shoulder: Secondary | ICD-10-CM | POA: Diagnosis not present

## 2020-07-08 DIAGNOSIS — L720 Epidermal cyst: Secondary | ICD-10-CM | POA: Diagnosis not present

## 2020-07-08 DIAGNOSIS — L570 Actinic keratosis: Secondary | ICD-10-CM | POA: Diagnosis not present

## 2020-07-16 DIAGNOSIS — Z87891 Personal history of nicotine dependence: Secondary | ICD-10-CM | POA: Diagnosis not present

## 2020-07-16 DIAGNOSIS — E7849 Other hyperlipidemia: Secondary | ICD-10-CM | POA: Diagnosis not present

## 2020-07-16 DIAGNOSIS — I25708 Atherosclerosis of coronary artery bypass graft(s), unspecified, with other forms of angina pectoris: Secondary | ICD-10-CM | POA: Diagnosis not present

## 2020-07-16 DIAGNOSIS — I10 Essential (primary) hypertension: Secondary | ICD-10-CM | POA: Diagnosis not present

## 2020-08-20 ENCOUNTER — Telehealth: Payer: Self-pay

## 2020-08-20 ENCOUNTER — Ambulatory Visit (INDEPENDENT_AMBULATORY_CARE_PROVIDER_SITE_OTHER): Payer: Medicare Other | Admitting: Cardiology

## 2020-08-20 ENCOUNTER — Other Ambulatory Visit: Payer: Self-pay

## 2020-08-20 ENCOUNTER — Other Ambulatory Visit (HOSPITAL_COMMUNITY)
Admission: RE | Admit: 2020-08-20 | Discharge: 2020-08-20 | Disposition: A | Discharge status: Home / Self Care | Payer: Medicare Other | Source: Ambulatory Visit | Attending: Cardiology | Admitting: Cardiology

## 2020-08-20 ENCOUNTER — Encounter: Payer: Self-pay | Admitting: Cardiology

## 2020-08-20 VITALS — BP 134/78 | HR 60 | Ht 73.0 in | Wt 290.2 lb

## 2020-08-20 DIAGNOSIS — Z951 Presence of aortocoronary bypass graft: Secondary | ICD-10-CM | POA: Diagnosis not present

## 2020-08-20 DIAGNOSIS — I10 Essential (primary) hypertension: Secondary | ICD-10-CM | POA: Diagnosis present

## 2020-08-20 DIAGNOSIS — E785 Hyperlipidemia, unspecified: Secondary | ICD-10-CM

## 2020-08-20 DIAGNOSIS — Z9989 Dependence on other enabling machines and devices: Secondary | ICD-10-CM

## 2020-08-20 DIAGNOSIS — G4733 Obstructive sleep apnea (adult) (pediatric): Secondary | ICD-10-CM

## 2020-08-20 DIAGNOSIS — I25708 Atherosclerosis of coronary artery bypass graft(s), unspecified, with other forms of angina pectoris: Secondary | ICD-10-CM | POA: Diagnosis not present

## 2020-08-20 DIAGNOSIS — E669 Obesity, unspecified: Secondary | ICD-10-CM

## 2020-08-20 LAB — BASIC METABOLIC PANEL
Anion gap: 9 (ref 5–15)
BUN: 24 mg/dL — ABNORMAL HIGH (ref 8–23)
CO2: 24 mmol/L (ref 22–32)
Calcium: 9.6 mg/dL (ref 8.9–10.3)
Chloride: 104 mmol/L (ref 98–111)
Creatinine, Ser: 1.12 mg/dL (ref 0.61–1.24)
GFR, Estimated: >60 mL/min (ref >60)
Glucose, Bld: 101 mg/dL — ABNORMAL HIGH (ref 70–99)
Potassium: 4.4 mmol/L (ref 3.5–5.1)
Sodium: 137 mmol/L (ref 135–145)

## 2020-08-20 NOTE — Telephone Encounter (Signed)
Spoke to patient. He verbalized understanding regarding his lab work and no medication changes.

## 2020-08-20 NOTE — Patient Instructions (Signed)
Medication Instructions:  Your physician recommends that you continue on your current medications as directed. Please refer to the Current Medication list given to you today.  *If you need a refill on your cardiac medications before your next appointment, please call your pharmacy*   Lab Work: Your physician recommends that you return for lab work in: Today   If you have labs (blood work) drawn today and your tests are completely normal, you will receive your results only by: Marland Kitchen MyChart Message (if you have MyChart) OR . A paper copy in the mail If you have any lab test that is abnormal or we need to change your treatment, we will call you to review the results.   Testing/Procedures: NONE    Follow-Up: At [facility], you and your health needs are our priority.  As part of our continuing mission to provide you with exceptional heart care, we have created designated Provider Care Teams.  These Care Teams include your primary Cardiologist (physician) and Advanced Practice Providers (APPs -  Physician Assistants and Nurse Practitioners) who all work together to provide you with the care you need, when you need it.  We recommend signing up for the patient portal called "MyChart".  Sign up information is provided on this After Visit Summary.  MyChart is used to connect with patients for Virtual Visits (Telemedicine).  Patients are able to view lab/test results, encounter notes, upcoming appointments, etc.  Non-urgent messages can be sent to your provider as well.   To learn more about what you can do with MyChart, go to [email or web address].    Your next appointment:   1 year(s)  The format for your next appointment:   In Person  Provider:   With MD    Other Instructions Thank you for choosing [facility]!

## 2020-08-20 NOTE — Assessment & Plan Note (Signed)
PCP follows- last LDL 57

## 2020-08-20 NOTE — Assessment & Plan Note (Signed)
CABG 1997- low risk Myoview Dec 2019

## 2020-08-20 NOTE — Assessment & Plan Note (Signed)
Compliant 

## 2020-08-20 NOTE — Progress Notes (Signed)
Cardiology Office Note:    Date:  08/20/2020   ID:  Joshua Gentry, DOB 26-Aug-1945, MRN [id number]  PCP:  Joshua Found, MD  Cardiologist:  Joshua Docker, MD (Inactive)  Electrophysiologist:  None   Referring MD: Joshua Found, MD   No chief complaint on file.   History of Present Illness:    Joshua Gentry is a 75 y.o. male with a hx of CABG in 1997.  Nuclear stress test in 2019 was low risk.  Ejection fraction was 55 to 65%.  He has a chronic right bundle branch block.  He is obese and has sleep apnea.  He is on CPAP.  He is in the office today for routine 1 year follow-up.  Since we saw him last he has done well.  He has had some problems with back pain and sciatica and has not been able to walk on his treadmill like he had been.  Over the last couple years he has gained about 30 pounds.  He denies any chest pain or any issues with his medications.  He is compliant with his CPAP.  He has been vaccinated and boosted against COVID.  Past Medical History:  Diagnosis Date  . 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 Coronary artery disease    a. s/p CABG 1997.  Marland Kitchen Heart attack ([facility])   . Hyperlipidemia   . Hypertension   . Obesity (BMI 30-39.9)   . OSA on CPAP March 2016   Compliant  . RBBB with left anterior fascicular block     Past Surgical History:  Procedure Laterality Date  . COLONOSCOPY N/A 08/01/2013   Procedure: COLONOSCOPY;  Surgeon: Joshua Heading, MD;  Location[facility];  Service: Gastroenterology;  Laterality: N/A;  . CORONARY ARTERY BYPASS GRAFT     x4  . Lens Bilateral April 2016   Replacment   . LOWER LEG SOFT TISSUE TUMOR EXCISION      Current Medications: Current Meds  Medication Sig  . amLODipine (NORVASC) 10 MG tablet Take 1 tablet by mouth once daily  . aspirin 81 MG tablet Take 81 mg by mouth daily.  . calcium-vitamin D 250-100 MG-UNIT per tablet Take 1 tablet by mouth 2 (two) times daily.  . cholecalciferol (VITAMIN D) 1000 units  tablet Take 1,000 Units by mouth daily.  . Coenzyme Q10 (CO Q 10) 100 MG CAPS Take 100 mg by mouth daily.  Marland Kitchen glucosamine-chondroitin 500-400 MG tablet Take 1 tablet by mouth daily.  . hydrochlorothiazide (HYDRODIURIL) 25 MG tablet Take 1 tablet by mouth once daily  . losartan (COZAAR) 100 MG tablet Take 1 tablet by mouth once daily  . Magnesium 400 MG CAPS Take 400 mg by mouth daily.  . metoprolol tartrate (LOPRESSOR) 100 MG tablet Take 1 tablet (100 mg total) by mouth 2 (two) times daily.  . Multiple Vitamins-Minerals (MULTIVITAMIN WITH MINERALS) tablet Take 1 tablet by mouth daily.  . nitroGLYCERIN (NITROSTAT) 0.4 MG SL tablet DISSOLVE ONE TABLET UNDER THE TONGUE EVERY 5 MINUTES AS NEEDED FOR CHEST PAIN.  DO NOT EXCEED A TOTAL OF 3 DOSES IN 15 MINUTES  . potassium chloride SA (KLOR-CON) 20 MEQ tablet Take 2 tablets by mouth once daily  . rosuvastatin (CRESTOR) 20 MG tablet Take 1 tablet by mouth once daily  . spironolactone (ALDACTONE) 25 MG tablet Take 1 tablet by mouth once daily     Allergies:   Niacin and Penicillins   Social History   Socioeconomic  History  . Marital status: Married    Spouse name: Not on file  . Number of children: 3  . Years of education: College  . Highest education level: Not on file  Occupational History  . Occupation: Dance movement psychotherapist  Tobacco Use  . Smoking status: Former Smoker    Packs/day: 1.50    Years: 32.00    Pack years: 48.00    Types: Cigarettes    Quit date: 08/17/1996    Years since quitting: 24.0  . Smokeless tobacco: Never Used  Vaping Use  . Vaping Use: Never used  Substance and Sexual Activity  . Alcohol use: Yes    Alcohol/week: 0.0 standard drinks    Comment: Occasional   . Drug use: No  . Sexual activity: Not on file  Other Topics Concern  . Not on file  Social History Narrative   Married with 3 children   No regular exercise   Drinks about 48oz of coke a day    Social Determinants of Radio broadcast assistant  Strain: Not on file  Food Insecurity: Not on file  Transportation Needs: Not on file  Physical Activity: Not on file  Stress: Not on file  Social Connections: Not on file     Family History: The patient's family history includes Diabetes in his brother and mother; Heart attack in his father; Hypertension in his mother.  ROS:   Please see the history of present illness.     All other systems reviewed and are negative.  EKGs/Labs/Other Studies Reviewed:    The following studies were reviewed today: Myoview 08/01/2018-  There was no ST segment deviation noted during stress.  Findings consistent with prior inferior/inferolateral/apical myocardial infarction. No current myocardium at jeopardy.  The left ventricular ejection fraction is normal (55-65%).  This is a low risk study.     EKG:  EKG is ordered today.  The ekg ordered today demonstrates NSR, SB HR 54, RBBB, LAD  Recent Labs: No results Gentry for requested labs within last 8760 hours.  Recent Lipid Panel    Component Value Date/Time   CHOL 114 10/25/2018 0813   TRIG 140 10/25/2018 0813   HDL 35 (L) 10/25/2018 0813   CHOLHDL 3.3 10/25/2018 0813   VLDL 29 10/07/2015 0733   LDLCALC 57 10/25/2018 0813    Physical Exam:    VS:  BP 134/78   Pulse 60   Ht 6\' 1"  (1.854 m)   Wt 290 lb 3.2 oz (131.6 kg)   SpO2 94%   BMI 38.29 kg/m     Wt Readings from Last 3 Encounters:  08/20/20 290 lb 3.2 oz (131.6 kg)  09/21/19 284 lb 3.2 oz (128.9 kg)  07/31/19 282 lb (127.9 kg)     GEN: Obese male, well developed in no acute distress HEENT: Normal NECK: No JVD; No carotid bruits CARDIAC: RRR, no murmurs, rubs, gallops RESPIRATORY:  Clear to auscultation without rales, wheezing or rhonchi  ABDOMEN: Soft, non-distended MUSCULOSKELETAL:  No edema; No deformity  SKIN: Warm and dry NEUROLOGIC:  Alert and oriented x 3 PSYCHIATRIC:  Normal affect   ASSESSMENT:    Hx of CABG CABG 1997- low risk Myoview Dec  2019  Essential hypertension Controlled- he is on an ARB, Spironolactone, and K+ supplement- check BMP today.   OSA on CPAP Compliant  Obesity (BMI 30-39.9) BMI 38  Dyslipidemia, goal LDL below 70 PCP follows- last LDL 57  PLAN:    We discussed weight loss.  Check  BMP- decreas K+ supplement if indicated.  F/U one year.   Medication Adjustments/Labs and Tests Ordered: Current medicines are reviewed at length with the patient today.  Concerns regarding medicines are outlined above.  Orders Placed This Encounter  Procedures  . Basic Metabolic Panel (BMET)  . EKG 12-Lead   No orders of the defined types were placed in this encounter.   Patient Instructions  Medication Instructions:  Your physician recommends that you continue on your current medications as directed. Please refer to the Current Medication list given to you today.  *If you need a refill on your cardiac medications before your next appointment, please call your pharmacy*   Lab Work: Your physician recommends that you return for lab work in: Today   If you have labs (blood work) drawn today and your tests are completely normal, you will receive your results only by: Marland Kitchen MyChart Message (if you have MyChart) OR . A paper copy in the mail If you have any lab test that is abnormal or we need to change your treatment, we will call you to review the results.   Testing/Procedures: NONE    Follow-Up: At [facility], you and your health needs are our priority.  As part of our continuing mission to provide you with exceptional heart care, we have created designated Provider Care Teams.  These Care Teams include your primary Cardiologist (physician) and Advanced Practice Providers (APPs -  Physician Assistants and Nurse Practitioners) who all work together to provide you with the care you need, when you need it.  We recommend signing up for the patient portal called "MyChart".  Sign up information is provided on  this After Visit Summary.  MyChart is used to connect with patients for Virtual Visits (Telemedicine).  Patients are able to view lab/test results, encounter notes, upcoming appointments, etc.  Non-urgent messages can be sent to your provider as well.   To learn more about what you can do with MyChart, go to [email or web address].    Your next appointment:   1 year(s)  The format for your next appointment:   In Person  Provider:   With MD    Other Instructions Thank you for choosing [facility]!       Angelena Form, PA-C  08/20/2020 10:28 AM    [facility]

## 2020-08-20 NOTE — Assessment & Plan Note (Signed)
Controlled- he is on an ARB, Spironolactone, and K+ supplement- check BMP today.

## 2020-08-20 NOTE — Assessment & Plan Note (Signed)
BMI 38 

## 2020-08-20 NOTE — Telephone Encounter (Signed)
-----   Message from Abelino Derrick, [place] sent at 08/20/2020  1:12 PM EST ----- Please let the patient know his labs looked good- potasium level is fine- no change in Rx.  Corine Shelter PA-C 08/20/2020 1:12 PM

## 2020-09-13 ENCOUNTER — Telehealth: Payer: Self-pay | Admitting: Cardiology

## 2020-09-13 ENCOUNTER — Other Ambulatory Visit: Payer: Self-pay

## 2020-09-13 MED ORDER — AMLODIPINE BESYLATE 10 MG PO TABS: 10.0000 mg | ORAL_TABLET | Freq: Every day | ORAL | 3 refills | Status: DC

## 2020-09-13 MED ORDER — HYDROCHLOROTHIAZIDE 25 MG PO TABS: 25.0000 mg | ORAL_TABLET | Freq: Every day | ORAL | 3 refills | Status: DC

## 2020-09-13 NOTE — Telephone Encounter (Signed)
   1. Which medications need to be refilled? (please list name of each medication and dose if known)   HYDRODIURAL 25 MG  AMLODIPINE 10 MG   2. Which pharmacy/location (including street and city if local pharmacy) is medication to be sent to? [facility] [place], [place]   3. Do they need a 30 day or 90 day supply?   PLEASE CHANGE PATIENTS PHARMACY TO [facility], [place]

## 2020-09-13 NOTE — Telephone Encounter (Signed)
Medication refill request for Amlodipine 10 mg tablets and Hydrochlorothiazide 25 mg tablets approved. Pharmacy changed to [facility] per patients request.

## 2020-09-23 ENCOUNTER — Other Ambulatory Visit: Payer: Self-pay

## 2020-09-23 ENCOUNTER — Encounter: Payer: Self-pay | Admitting: Adult Health

## 2020-09-23 ENCOUNTER — Ambulatory Visit (INDEPENDENT_AMBULATORY_CARE_PROVIDER_SITE_OTHER): Payer: Medicare Other | Admitting: Adult Health

## 2020-09-23 VITALS — BP 98/60 | HR 60 | Ht 72.0 in | Wt 290.0 lb

## 2020-09-23 DIAGNOSIS — I25708 Atherosclerosis of coronary artery bypass graft(s), unspecified, with other forms of angina pectoris: Secondary | ICD-10-CM

## 2020-09-23 DIAGNOSIS — G4733 Obstructive sleep apnea (adult) (pediatric): Secondary | ICD-10-CM | POA: Diagnosis not present

## 2020-09-23 NOTE — Progress Notes (Signed)
PATIENT: Joshua Gentry DOB: 1945-11-02  REASON FOR VISIT: follow up HISTORY FROM: patient  HISTORY OF PRESENT ILLNESS: Today 09/23/20:  Joshua Gentry is a 75 year old male with a history of obstructive sleep apnea on BiPAP.  He reports that he uses machine nightly.  Denies any new issues.  His download is below   Compliance Report Usage 08/24/2020 - 09/22/2020 Usage days 30/30 days (100%) >= 4 hours 30 days (100%) Average usage (days used) 9 hours 6 minutes   AirCurve 10 VAuto  Serial number [id number] Mode Spont IPAP 14 cmH2O EPAP 9 cmH2O Easy-Breathe On  Therapy Leaks - L/min Median: 0.0 95th percentile: 0.1 Maximum: 4.0 Events per hour AI: 0.4 HI: 0.2 AHI: 0.6  09/21/19 Joshua Gentry is a 75 year old male with a history of obstructive sleep apnea on BiPAP.  His download indicates that he use his machine nightly for compliance of 100%.  He uses machine greater than 4 hours each night.  On average he uses his machine 8 hours and 51 minutes.  His residual AHI is 0.3 on 14/9 centimeters of water.  He does not have a leak at all.  He reports that the BiPAP continues to work well for him.  He returns today for an evaluation.  HISTORY 09/15/18  Joshua Gentry is a 75 year old male here for follow-up for OSA using Bipap. His compliance data from 12/30/209-09/13/2018 shows 30/30 days of use at 100%. He had 30 days of > 4 hours of use, indicating excellent compliance with an average usage of 8 hours and 30 minutes. His current inspiratory pressure is 14 cmH2O and expiratory pressure is 9 cmH2O. AHI at goal 0.3 per hour, leak at 5.0. Overall, he is doing well and feels the BiPAP has been helpful. He is not having problems with his equipment. His Epworth sleepiness scale score is 6. He is doing well, and enjoying his retired life. He is keeping busy by babysitting his grandchildren. He presents today for follow-up.   REVIEW OF SYSTEMS: Out of a complete 14 system review of symptoms, the  patient complains only of the following symptoms, and all other reviewed systems are negative.  ESS7 FSS 35  ALLERGIES: Allergies  Allergen Reactions  . Niacin   . Penicillins     HOME MEDICATIONS: Outpatient Medications Prior to Visit  Medication Sig Dispense Refill  . amLODipine (NORVASC) 10 MG tablet Take 1 tablet (10 mg total) by mouth daily. 90 tablet 3  . aspirin 81 MG tablet Take 81 mg by mouth daily.    . calcium-vitamin D 250-100 MG-UNIT per tablet Take 1 tablet by mouth 2 (two) times daily.    . cholecalciferol (VITAMIN D) 1000 units tablet Take 1,000 Units by mouth daily.    . Coenzyme Q10 (CO Q 10) 100 MG CAPS Take 100 mg by mouth daily.    Marland Kitchen glucosamine-chondroitin 500-400 MG tablet Take 1 tablet by mouth daily.    . hydrochlorothiazide (HYDRODIURIL) 25 MG tablet Take 1 tablet (25 mg total) by mouth daily. 90 tablet 3  . losartan (COZAAR) 100 MG tablet Take 1 tablet by mouth once daily 90 tablet 3  . Magnesium 400 MG CAPS Take 400 mg by mouth daily. 90 capsule 3  . metoprolol tartrate (LOPRESSOR) 100 MG tablet Take 1 tablet (100 mg total) by mouth 2 (two) times daily. 180 tablet 1  . Multiple Vitamins-Minerals (MULTIVITAMIN WITH MINERALS) tablet Take 1 tablet by mouth daily.    . nitroGLYCERIN (NITROSTAT) 0.4 MG SL  tablet DISSOLVE ONE TABLET UNDER THE TONGUE EVERY 5 MINUTES AS NEEDED FOR CHEST PAIN.  DO NOT EXCEED A TOTAL OF 3 DOSES IN 15 MINUTES 25 tablet 3  . potassium chloride SA (KLOR-CON) 20 MEQ tablet Take 2 tablets by mouth once daily 180 tablet 2  . rosuvastatin (CRESTOR) 20 MG tablet Take 1 tablet by mouth once daily 90 tablet 3  . spironolactone (ALDACTONE) 25 MG tablet Take 1 tablet by mouth once daily 90 tablet 0   No facility-administered medications prior to visit.    PAST MEDICAL HISTORY: Past Medical History:  Diagnosis Date  . 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 Coronary artery disease    a. s/p CABG 1997.  Marland Kitchen Heart attack ([place])   .  Hyperlipidemia   . Hypertension   . Obesity (BMI 30-39.9)   . OSA on CPAP March 2016   Compliant  . RBBB with left anterior fascicular block     PAST SURGICAL HISTORY: Past Surgical History:  Procedure Laterality Date  . COLONOSCOPY N/A 08/01/2013   Procedure: COLONOSCOPY;  Surgeon: Jamesetta So, MD;  [facility];  Service: Gastroenterology;  Laterality: N/A;  . CORONARY ARTERY BYPASS GRAFT     x4  . Lens Bilateral April 2016   Replacment   . LOWER LEG SOFT TISSUE TUMOR EXCISION      FAMILY HISTORY: Family History  Problem Relation Age of Onset  . Hypertension Mother   . Diabetes Mother   . Heart attack Father   . Diabetes Brother     SOCIAL HISTORY: Social History   Socioeconomic History  . Marital status: Married    Spouse name: Not on file  . Number of children: 3  . Years of education: College  . Highest education level: Not on file  Occupational History  . Occupation: Dance movement psychotherapist  Tobacco Use  . Smoking status: Former Smoker    Packs/day: 1.50    Years: 32.00    Pack years: 48.00    Types: Cigarettes    Quit date: 08/17/1996    Years since quitting: 24.1  . Smokeless tobacco: Never Used  Vaping Use  . Vaping Use: Never used  Substance and Sexual Activity  . Alcohol use: Yes    Alcohol/week: 0.0 standard drinks    Comment: Occasional   . Drug use: No  . Sexual activity: Not on file  Other Topics Concern  . Not on file  Social History Narrative   Married with 3 children   No regular exercise   Drinks about 48oz of coke a day    Social Determinants of Radio broadcast assistant Strain: Not on file  Food Insecurity: Not on file  Transportation Needs: Not on file  Physical Activity: Not on file  Stress: Not on file  Social Connections: Not on file  Intimate Partner Violence: Not on file      PHYSICAL EXAM  Vitals:   09/23/20 1426  BP: 98/60  Pulse: 60  Weight: 290 lb (131.5 kg)  Height: 6' (1.829 m)   Body mass  index is 39.33 kg/m.  Generalized: Well developed, in no acute distress  Chest: Lungs clear to auscultation bilaterally  Neurological examination  Mentation: Alert oriented to time, place, history taking. Follows all commands speech and language fluent Cranial nerve II-XII: Extraocular movements were full, visual field were full on confrontational test Head turning and shoulder shrug  were normal and symmetric. Motor: The motor testing reveals  5 over 5 strength of all 4 extremities. Good symmetric motor tone is noted throughout.  Sensory: Sensory testing is intact to soft touch on all 4 extremities. No evidence of extinction is noted.  Gait and station: Gait is normal.     DIAGNOSTIC DATA (LABS, IMAGING, TESTING) - I reviewed patient records, labs, notes, testing and imaging myself where available.  Lab Results  Component Value Date   WBC 7.4 10/07/2015   HGB 15.9 10/07/2015   HCT 47.1 10/07/2015   MCV 84.3 10/07/2015   PLT 186 10/07/2015      Component Value Date/Time   NA 137 08/20/2020 1115   K 4.4 08/20/2020 1115   CL 104 08/20/2020 1115   CO2 24 08/20/2020 1115   GLUCOSE 101 (H) 08/20/2020 1115   BUN 24 (H) 08/20/2020 1115   CREATININE 1.12 08/20/2020 1115   CREATININE 0.88 07/01/2017 0833   CALCIUM 9.6 08/20/2020 1115   PROT 6.1 10/07/2015 0733   ALBUMIN 3.8 10/07/2015 0733   AST 19 10/07/2015 0733   ALT 18 10/07/2015 0733   ALKPHOS 76 10/07/2015 0733   BILITOT 1.4 (H) 10/07/2015 0733   GFRNONAA >60 08/20/2020 1115   Lab Results  Component Value Date   CHOL 114 10/25/2018   HDL 35 (L) 10/25/2018   LDLCALC 57 10/25/2018   TRIG 140 10/25/2018   CHOLHDL 3.3 10/25/2018   Lab Results  Component Value Date   HGBA1C 5.6 02/04/2010   No results found for: VITAMINB12 Lab Results  Component Value Date   TSH 1.69 10/07/2015      ASSESSMENT AND PLAN 75 y.o. year old male  has a past medical history of Cancer ([place]) (2006), Coronary artery disease, Heart  attack ([place]), Hyperlipidemia, Hypertension, Obesity (BMI 30-39.9), OSA on CPAP (March 2016), and RBBB with left anterior fascicular block. here with :  1. Obstructive sleep apnea on BiPAP  The patient's BiPAP download shows excellent compliance and good treatment of his apnea.  He is encouraged to continue using BiPAP nightly and greater than 4 hours each night.  He is advised that if his symptoms worsen or he develops new symptoms he should let us know.  He will follow-up in 1 year or sooner if needed   I spent 20 minutes of face-to-face and non-face-to-face time with patient.  This included previsit chart review, lab review, study review, order entry, electronic health record documentation, patient education.    Ward Givens, MSN, NP-C 09/23/2020, 2:46 PM [facility] [address], [place], [place] [address] [phone]  I reviewed the above note and documentation by the Nurse Practitioner and agree with the history, exam, assessment and plan as outlined above. I was available for consultation. Star Age, MD, PhD [facility])

## 2020-09-23 NOTE — Patient Instructions (Signed)
Continue using CPAP nightly and greater than 4 hours each night °If your symptoms worsen or you develop new symptoms please let us know.  ° °

## 2020-10-03 ENCOUNTER — Telehealth: Payer: Self-pay | Admitting: Internal Medicine

## 2020-10-03 MED ORDER — POTASSIUM CHLORIDE CRYS ER 20 MEQ PO TBCR: 40.0000 meq | EXTENDED_RELEASE_TABLET | Freq: Every day | ORAL | 2 refills | Status: DC

## 2020-10-03 MED ORDER — SPIRONOLACTONE 25 MG PO TABS: 25.0000 mg | ORAL_TABLET | Freq: Every day | ORAL | 2 refills | Status: DC

## 2020-10-03 MED ORDER — METOPROLOL TARTRATE 100 MG PO TABS: 100.0000 mg | ORAL_TABLET | Freq: Two times a day (BID) | ORAL | 2 refills | Status: DC

## 2020-10-03 MED ORDER — NITROGLYCERIN 0.4 MG SL SUBL: SUBLINGUAL_TABLET | SUBLINGUAL | 3 refills | Status: DC

## 2020-10-03 NOTE — Telephone Encounter (Signed)
Refilled to [facility] as requested

## 2020-10-03 NOTE — Telephone Encounter (Signed)
*  STAT* If patient is at the pharmacy, call can be transferred to refill team.   1. Which medications need to be refilled? (please list name of each medication and dose if known)  nitroGLYCERIN (NITROSTAT) 0.4 MG SL tablet potassium chloride SA (KLOR-CON) 20 MEQ tablet spironolactone (ALDACTONE) 25 MG tablet metoprolol tartrate (LOPRESSOR) 100 MG tablet  2. Which pharmacy/location (including street and city if local pharmacy) is medication to be sent to? [facility]  3. Do they need a 30 day or 90 day supply? [place]

## 2020-11-12 ENCOUNTER — Telehealth: Payer: Self-pay | Admitting: Cardiology

## 2020-11-12 MED ORDER — ROSUVASTATIN CALCIUM 20 MG PO TABS: 20.0000 mg | ORAL_TABLET | Freq: Every day | ORAL | 3 refills | Status: DC

## 2020-11-12 MED ORDER — LOSARTAN POTASSIUM 100 MG PO TABS: 100.0000 mg | ORAL_TABLET | Freq: Every day | ORAL | 3 refills | Status: DC

## 2020-11-12 MED ORDER — SPIRONOLACTONE 25 MG PO TABS: 25.0000 mg | ORAL_TABLET | Freq: Every day | ORAL | 3 refills | Status: DC

## 2020-11-12 NOTE — Telephone Encounter (Signed)
*  STAT* If patient is at the pharmacy, call can be transferred to refill team.   1. Which medications need to be refilled? (please list name of each medication and dose if known) Refills: Rosuvastatin, Losartan & Spiroholactone  2. Which pharmacy/location (including street and city if local pharmacy) is medication to be sent to?  [facility], [place]  3. Do they need a 30 day or 90 day supply? [place]

## 2020-11-12 NOTE — Telephone Encounter (Signed)
Medication refill request approved for  Rosuvastatin 20 mg tablets Losartan 100 mg tablets Spironolactone 25 mg tablets  Sent to [facility].

## 2020-11-13 DIAGNOSIS — Z87891 Personal history of nicotine dependence: Secondary | ICD-10-CM | POA: Diagnosis not present

## 2020-11-13 DIAGNOSIS — I25708 Atherosclerosis of coronary artery bypass graft(s), unspecified, with other forms of angina pectoris: Secondary | ICD-10-CM | POA: Diagnosis not present

## 2020-11-13 DIAGNOSIS — I10 Essential (primary) hypertension: Secondary | ICD-10-CM | POA: Diagnosis not present

## 2020-11-13 DIAGNOSIS — E7849 Other hyperlipidemia: Secondary | ICD-10-CM | POA: Diagnosis not present

## 2020-11-19 DIAGNOSIS — J329 Chronic sinusitis, unspecified: Secondary | ICD-10-CM | POA: Diagnosis not present

## 2020-11-19 DIAGNOSIS — I10 Essential (primary) hypertension: Secondary | ICD-10-CM | POA: Diagnosis not present

## 2020-11-19 DIAGNOSIS — E669 Obesity, unspecified: Secondary | ICD-10-CM | POA: Diagnosis not present

## 2020-11-19 DIAGNOSIS — Z6838 Body mass index (BMI) 38.0-38.9, adult: Secondary | ICD-10-CM | POA: Diagnosis not present

## 2020-11-19 DIAGNOSIS — E7849 Other hyperlipidemia: Secondary | ICD-10-CM | POA: Diagnosis not present

## 2020-12-14 DIAGNOSIS — I10 Essential (primary) hypertension: Secondary | ICD-10-CM | POA: Diagnosis not present

## 2020-12-14 DIAGNOSIS — E7849 Other hyperlipidemia: Secondary | ICD-10-CM | POA: Diagnosis not present

## 2020-12-14 DIAGNOSIS — I25708 Atherosclerosis of coronary artery bypass graft(s), unspecified, with other forms of angina pectoris: Secondary | ICD-10-CM | POA: Diagnosis not present

## 2020-12-14 DIAGNOSIS — Z87891 Personal history of nicotine dependence: Secondary | ICD-10-CM | POA: Diagnosis not present

## 2021-01-06 ENCOUNTER — Other Ambulatory Visit: Payer: Self-pay

## 2021-01-06 ENCOUNTER — Ambulatory Visit (INDEPENDENT_AMBULATORY_CARE_PROVIDER_SITE_OTHER): Payer: Medicare Other

## 2021-01-06 VITALS — BP 115/62 | HR 58

## 2021-01-06 DIAGNOSIS — Z0130 Encounter for examination of blood pressure without abnormal findings: Secondary | ICD-10-CM

## 2021-01-06 DIAGNOSIS — L570 Actinic keratosis: Secondary | ICD-10-CM | POA: Diagnosis not present

## 2021-01-06 DIAGNOSIS — L814 Other melanin hyperpigmentation: Secondary | ICD-10-CM | POA: Diagnosis not present

## 2021-01-06 DIAGNOSIS — Z85828 Personal history of other malignant neoplasm of skin: Secondary | ICD-10-CM | POA: Diagnosis not present

## 2021-01-06 DIAGNOSIS — D1801 Hemangioma of skin and subcutaneous tissue: Secondary | ICD-10-CM | POA: Diagnosis not present

## 2021-01-06 DIAGNOSIS — D044 Carcinoma in situ of skin of scalp and neck: Secondary | ICD-10-CM | POA: Diagnosis not present

## 2021-01-06 DIAGNOSIS — D220 Melanocytic nevi of lip: Secondary | ICD-10-CM | POA: Diagnosis not present

## 2021-01-06 DIAGNOSIS — L821 Other seborrheic keratosis: Secondary | ICD-10-CM | POA: Diagnosis not present

## 2021-01-06 NOTE — Patient Instructions (Addendum)
Your blood pressure is in the normal range today.   Continue to monitor blood pressures at home.    Thank you for choosing [facility] !

## 2021-02-07 DIAGNOSIS — E6609 Other obesity due to excess calories: Secondary | ICD-10-CM | POA: Diagnosis not present

## 2021-02-07 DIAGNOSIS — I25708 Atherosclerosis of coronary artery bypass graft(s), unspecified, with other forms of angina pectoris: Secondary | ICD-10-CM | POA: Diagnosis not present

## 2021-02-07 DIAGNOSIS — Z1331 Encounter for screening for depression: Secondary | ICD-10-CM | POA: Diagnosis not present

## 2021-02-07 DIAGNOSIS — I2510 Atherosclerotic heart disease of native coronary artery without angina pectoris: Secondary | ICD-10-CM | POA: Diagnosis not present

## 2021-02-07 DIAGNOSIS — Z6834 Body mass index (BMI) 34.0-34.9, adult: Secondary | ICD-10-CM | POA: Diagnosis not present

## 2021-02-07 DIAGNOSIS — I10 Essential (primary) hypertension: Secondary | ICD-10-CM | POA: Diagnosis not present

## 2021-02-07 DIAGNOSIS — K635 Polyp of colon: Secondary | ICD-10-CM | POA: Diagnosis not present

## 2021-02-07 DIAGNOSIS — E7849 Other hyperlipidemia: Secondary | ICD-10-CM | POA: Diagnosis not present

## 2021-02-07 DIAGNOSIS — M1991 Primary osteoarthritis, unspecified site: Secondary | ICD-10-CM | POA: Diagnosis not present

## 2021-02-07 DIAGNOSIS — C4021 Malignant neoplasm of long bones of right lower limb: Secondary | ICD-10-CM | POA: Diagnosis not present

## 2021-02-07 DIAGNOSIS — Z0000 Encounter for general adult medical examination without abnormal findings: Secondary | ICD-10-CM | POA: Diagnosis not present

## 2021-02-07 DIAGNOSIS — Z1389 Encounter for screening for other disorder: Secondary | ICD-10-CM | POA: Diagnosis not present

## 2021-02-11 DIAGNOSIS — Z23 Encounter for immunization: Secondary | ICD-10-CM | POA: Diagnosis not present

## 2021-02-13 DIAGNOSIS — I10 Essential (primary) hypertension: Secondary | ICD-10-CM | POA: Diagnosis not present

## 2021-02-13 DIAGNOSIS — I25708 Atherosclerosis of coronary artery bypass graft(s), unspecified, with other forms of angina pectoris: Secondary | ICD-10-CM | POA: Diagnosis not present

## 2021-02-13 DIAGNOSIS — E7849 Other hyperlipidemia: Secondary | ICD-10-CM | POA: Diagnosis not present

## 2021-06-25 DIAGNOSIS — Z23 Encounter for immunization: Secondary | ICD-10-CM | POA: Diagnosis not present

## 2021-07-01 DIAGNOSIS — Z951 Presence of aortocoronary bypass graft: Secondary | ICD-10-CM | POA: Diagnosis not present

## 2021-07-01 DIAGNOSIS — C4921 Malignant neoplasm of connective and soft tissue of right lower limb, including hip: Secondary | ICD-10-CM | POA: Diagnosis not present

## 2021-07-01 DIAGNOSIS — C499 Malignant neoplasm of connective and soft tissue, unspecified: Secondary | ICD-10-CM | POA: Diagnosis not present

## 2021-07-01 DIAGNOSIS — R918 Other nonspecific abnormal finding of lung field: Secondary | ICD-10-CM | POA: Diagnosis not present

## 2021-07-08 ENCOUNTER — Telehealth: Payer: Self-pay

## 2021-07-08 ENCOUNTER — Other Ambulatory Visit: Payer: Self-pay

## 2021-07-08 MED ORDER — SPIRONOLACTONE 25 MG PO TABS: 25.0000 mg | ORAL_TABLET | Freq: Every day | ORAL | 0 refills | Status: DC

## 2021-07-08 MED ORDER — POTASSIUM CHLORIDE CRYS ER 20 MEQ PO TBCR: 40.0000 meq | EXTENDED_RELEASE_TABLET | Freq: Every day | ORAL | 2 refills | Status: DC

## 2021-07-08 MED ORDER — METOPROLOL TARTRATE 100 MG PO TABS: 100.0000 mg | ORAL_TABLET | Freq: Two times a day (BID) | ORAL | 0 refills | Status: DC

## 2021-07-08 NOTE — Telephone Encounter (Signed)
[facility] for [facility] for metoprolol and spironolactone e-scribed. Patient given 90 day supply but will need an apt for refills.

## 2021-07-08 NOTE — Telephone Encounter (Signed)
Medication refill request for Potassium CL 20 mEq tablets

## 2021-07-14 DIAGNOSIS — L570 Actinic keratosis: Secondary | ICD-10-CM | POA: Diagnosis not present

## 2021-07-14 DIAGNOSIS — Z85828 Personal history of other malignant neoplasm of skin: Secondary | ICD-10-CM | POA: Diagnosis not present

## 2021-07-14 DIAGNOSIS — L821 Other seborrheic keratosis: Secondary | ICD-10-CM | POA: Diagnosis not present

## 2021-07-14 DIAGNOSIS — D1801 Hemangioma of skin and subcutaneous tissue: Secondary | ICD-10-CM | POA: Diagnosis not present

## 2021-07-14 DIAGNOSIS — L814 Other melanin hyperpigmentation: Secondary | ICD-10-CM | POA: Diagnosis not present

## 2021-08-08 ENCOUNTER — Other Ambulatory Visit: Payer: Self-pay | Admitting: Cardiology

## 2021-09-11 ENCOUNTER — Other Ambulatory Visit: Payer: Self-pay | Admitting: Student

## 2021-09-11 MED ORDER — HYDROCHLOROTHIAZIDE 25 MG PO TABS: 25.0000 mg | ORAL_TABLET | Freq: Every day | ORAL | 0 refills | Status: DC

## 2021-09-17 ENCOUNTER — Other Ambulatory Visit: Payer: Self-pay | Admitting: Student

## 2021-09-29 ENCOUNTER — Ambulatory Visit (INDEPENDENT_AMBULATORY_CARE_PROVIDER_SITE_OTHER): Payer: Medicare Other | Admitting: Adult Health

## 2021-09-29 VITALS — BP 117/56 | HR 51 | Ht 72.0 in | Wt 257.0 lb

## 2021-09-29 DIAGNOSIS — G4733 Obstructive sleep apnea (adult) (pediatric): Secondary | ICD-10-CM | POA: Diagnosis not present

## 2021-09-29 NOTE — Progress Notes (Signed)
PATIENT: Joshua Gentry DOB: [birth year]  REASON FOR VISIT: follow up HISTORY FROM: patient  HISTORY OF PRESENT ILLNESS: Today 09/29/21:  09/23/20: Mr. Joshua Gentry is a 76 year old male with a history of OSA on CPAP. He returns today for follow-up.  His download indicates that he uses machine nightly for compliance of 100%.  He uses machine greater than 4 hours each night.  On average he uses his machine 8 hours and 58 minutes.  His residual AHI is 0.5 on 14 over 9 cm of water.  He does not have a significant leak.  He returns today for an evaluation.   Mr. Joshua Gentry is a 76 year old male with a history of obstructive sleep apnea on BiPAP.  He reports that he uses machine nightly.  Denies any new issues.  His download is below   Compliance Report Usage 08/24/2020 - 09/22/2020 Usage days 30/30 days (100%) >= 4 hours 30 days (100%) Average usage (days used) 9 hours 6 minutes   AirCurve 10 VAuto  Serial number [id number] Mode Spont IPAP 14 cmH2O EPAP 9 cmH2O Easy-Breathe On  Therapy Leaks - L/min Median: 0.0 95th percentile: 0.1 Maximum: 4.0 Events per hour AI: 0.4 HI: 0.2 AHI: 0.6  09/21/19 Mr. Joshua Gentry is a 76 year old male with a history of obstructive sleep apnea on BiPAP.  His download indicates that he use his machine nightly for compliance of 100%.  He uses machine greater than 4 hours each night.  On average he uses his machine 8 hours and 51 minutes.  His residual AHI is 0.3 on 14/9 centimeters of water.  He does not have a leak at all.  He reports that the BiPAP continues to work well for him.  He returns today for an evaluation.  HISTORY 09/15/18  Mr. Joshua Gentry is a 76 year old male here for follow-up for OSA using Bipap. His compliance data from 12/30/209-09/13/2018 shows 30/30 days of use at 100%. He had 30 days of > 4 hours of use, indicating excellent compliance with an average usage of 8 hours and 30 minutes. His current inspiratory pressure is 14 cmH2O and expiratory pressure  is 9 cmH2O. AHI at goal 0.3 per hour, leak at 5.0. Overall, he is doing well and feels the BiPAP has been helpful. He is not having problems with his equipment. His Epworth sleepiness scale score is 6. He is doing well, and enjoying his retired life. He is keeping busy by babysitting his grandchildren. He presents today for follow-up.   REVIEW OF SYSTEMS: Out of a complete 14 system review of symptoms, the patient complains only of the following symptoms, and all other reviewed systems are negative.  ESS7 FSS 35  ALLERGIES: Allergies  Allergen Reactions   Niacin    Penicillins     HOME MEDICATIONS: Outpatient Medications Prior to Visit  Medication Sig Dispense Refill   amLODipine (NORVASC) 10 MG tablet Take 1 tablet (10 mg total) by mouth daily. 90 tablet 3   aspirin 81 MG tablet Take 81 mg by mouth daily.     calcium-vitamin D 250-100 MG-UNIT per tablet Take 1 tablet by mouth 2 (two) times daily.     cholecalciferol (VITAMIN D) 1000 units tablet Take 1,000 Units by mouth daily.     Coenzyme Q10 (CO Q 10) 100 MG CAPS Take 100 mg by mouth daily.     glucosamine-chondroitin 500-400 MG tablet Take 1 tablet by mouth daily.     hydrochlorothiazide (HYDRODIURIL) 25 MG tablet Take 1 tablet (25  mg total) by mouth daily. 90 tablet 0   losartan (COZAAR) 100 MG tablet TAKE 1 TABLET(100 MG) BY MOUTH DAILY 90 tablet 3   Magnesium 400 MG CAPS Take 400 mg by mouth daily. 90 capsule 3   metoprolol tartrate (LOPRESSOR) 100 MG tablet Take 1 tablet (100 mg total) by mouth 2 (two) times daily. 180 tablet 0   Multiple Vitamins-Minerals (MULTIVITAMIN WITH MINERALS) tablet Take 1 tablet by mouth daily.     nitroGLYCERIN (NITROSTAT) 0.4 MG SL tablet DISSOLVE ONE TABLET UNDER THE TONGUE EVERY 5 MINUTES AS NEEDED FOR CHEST PAIN.  DO NOT EXCEED A TOTAL OF 3 DOSES IN 15 MINUTES 25 tablet 3   potassium chloride SA (KLOR-CON) 20 MEQ tablet Take 2 tablets (40 mEq total) by mouth daily. 180 tablet 2   rosuvastatin  (CRESTOR) 20 MG tablet TAKE 1 TABLET(20 MG) BY MOUTH DAILY 90 tablet 3   spironolactone (ALDACTONE) 25 MG tablet Take 1 tablet (25 mg total) by mouth daily. 90 tablet 0   No facility-administered medications prior to visit.    PAST MEDICAL HISTORY: Past Medical History:  Diagnosis Date   Cancer ([place]) 2006   Leg (Right) Sarcoma   Coronary artery disease    a. s/p CABG 1997.   Heart attack ([place])    Hyperlipidemia    Hypertension    Obesity (BMI 30-39.9)    OSA on CPAP March 2016   Compliant   RBBB with left anterior fascicular block     PAST SURGICAL HISTORY: Past Surgical History:  Procedure Laterality Date   COLONOSCOPY N/A 08/01/2013   Procedure: COLONOSCOPY;  Surgeon: Jamesetta So, MD;  [facility];  Service: Gastroenterology;  Laterality: N/A;   CORONARY ARTERY BYPASS GRAFT     x4   Lens Bilateral April 2016   Replacment    LOWER LEG SOFT TISSUE TUMOR EXCISION      FAMILY HISTORY: Family History  Problem Relation Age of Onset   Hypertension Mother    Diabetes Mother    Heart attack Father    Diabetes Brother     SOCIAL HISTORY: Social History   Socioeconomic History   Marital status: Married    Spouse name: Not on file   Number of children: 3   Years of education: College   Highest education level: Not on file  Occupational History   Occupation: Dance movement psychotherapist  Tobacco Use   Smoking status: Former    Packs/day: 1.50    Years: 32.00    Pack years: 48.00    Types: Cigarettes    Quit date: 08/17/1996    Years since quitting: 25.1   Smokeless tobacco: Never  Vaping Use   Vaping Use: Never used  Substance and Sexual Activity   Alcohol use: Yes    Alcohol/week: 0.0 standard drinks    Comment: Occasional    Drug use: No   Sexual activity: Not on file  Other Topics Concern   Not on file  Social History Narrative   Married with 3 children   No regular exercise   Drinks about 48oz of coke a day    Social Determinants of Health    Financial Resource Strain: Not on file  Food Insecurity: Not on file  Transportation Needs: Not on file  Physical Activity: Not on file  Stress: Not on file  Social Connections: Not on file  Intimate Partner Violence: Not on file      PHYSICAL EXAM  Vitals:   09/29/21 1042  BP: (!) 117/56  Pulse: (!) 51  Weight: 257 lb (116.6 kg)  Height: 6' (1.829 m)   Body mass index is 34.86 kg/m.  Generalized: Well developed, in no acute distress  Chest: Lungs clear to auscultation bilaterally  Neurological examination  Mentation: Alert oriented to time, place, history taking. Follows all commands speech and language fluent Cranial nerve II-XII: Extraocular movements were full, visual field were full on confrontational test Head turning and shoulder shrug  were normal and symmetric. Motor: The motor testing reveals 5 over 5 strength of all 4 extremities. Good symmetric motor tone is noted throughout.  Sensory: Sensory testing is intact to soft touch on all 4 extremities. No evidence of extinction is noted.  Gait and station: Gait is normal.     DIAGNOSTIC DATA (LABS, IMAGING, TESTING) - I reviewed patient records, labs, notes, testing and imaging myself where available.  Lab Results  Component Value Date   WBC 7.4 10/07/2015   HGB 15.9 10/07/2015   HCT 47.1 10/07/2015   MCV 84.3 10/07/2015   PLT 186 10/07/2015      Component Value Date/Time   NA 137 08/20/2020 1115   K 4.4 08/20/2020 1115   CL 104 08/20/2020 1115   CO2 24 08/20/2020 1115   GLUCOSE 101 (H) 08/20/2020 1115   BUN 24 (H) 08/20/2020 1115   CREATININE 1.12 08/20/2020 1115   CREATININE 0.88 07/01/2017 0833   CALCIUM 9.6 08/20/2020 1115   PROT 6.1 10/07/2015 0733   ALBUMIN 3.8 10/07/2015 0733   AST 19 10/07/2015 0733   ALT 18 10/07/2015 0733   ALKPHOS 76 10/07/2015 0733   BILITOT 1.4 (H) 10/07/2015 0733   GFRNONAA >60 08/20/2020 1115   Lab Results  Component Value Date   CHOL 114 10/25/2018   HDL 35  (L) 10/25/2018   LDLCALC 57 10/25/2018   TRIG 140 10/25/2018   CHOLHDL 3.3 10/25/2018   Lab Results  Component Value Date   HGBA1C 5.6 02/04/2010   No results found for: VITAMINB12 Lab Results  Component Value Date   TSH 1.69 10/07/2015      ASSESSMENT AND PLAN 76 y.o. year old male  has a past medical history of Cancer ([place]) (2006), Coronary artery disease, Heart attack ([place]), Hyperlipidemia, Hypertension, Obesity (BMI 30-39.9), OSA on CPAP (March 2016), and RBBB with left anterior fascicular block. here with :  Obstructive sleep apnea on BiPAP  Good compliance Good treatment of apnea Encourage patient to use BiPAP nightly and greater than 4 hours each night Reports that machine is stating he has has exceeded its life expectancy.  New machine ordered. Follow-up in 1 year or sooner if needed       Ward Givens, MSN, NP-C 09/29/2021, 11:19 AM [facility] [address] [place], [place] [address] [phone]  I reviewed the above note and documentation by the Nurse Practitioner and agree with the history, exam, assessment and plan as outlined above. I was available for consultation. Star Age, MD, PhD [facility])

## 2021-09-29 NOTE — Patient Instructions (Signed)
Continue using BIPAP nightly and greater than 4 hours each night Order sent for new machine If your symptoms worsen or you develop new symptoms please let us know.

## 2021-10-01 NOTE — Progress Notes (Signed)
Denyse Amass, RN; Vanessa Ralphs got it!      Previous Messages   ----- Message -----  From: Brandon Melnick, RN  Sent: 09/30/2021   4:13 PM EST  To: Vanessa Ralphs, Marchelle Gearing, *   Good afternoon,   New order in Epic for pt. Joshua Gentry. [facility]  Male, 76 y.o., [birth year]  Pronouns:  he/him/his  MRN:  [id number]   Thank you, Lovey Newcomer RN

## 2021-10-11 ENCOUNTER — Other Ambulatory Visit: Payer: Self-pay | Admitting: Internal Medicine

## 2021-10-13 ENCOUNTER — Other Ambulatory Visit: Payer: Self-pay | Admitting: Internal Medicine

## 2021-10-13 NOTE — Telephone Encounter (Signed)
This is a  pt.  °

## 2021-11-18 ENCOUNTER — Telehealth: Payer: Self-pay | Admitting: Internal Medicine

## 2021-11-18 MED ORDER — LOSARTAN POTASSIUM 100 MG PO TABS: ORAL_TABLET | ORAL | 1 refills | Status: DC

## 2021-11-18 MED ORDER — SPIRONOLACTONE 25 MG PO TABS: ORAL_TABLET | ORAL | 1 refills | Status: DC

## 2021-11-18 MED ORDER — AMLODIPINE BESYLATE 10 MG PO TABS: 10.0000 mg | ORAL_TABLET | Freq: Every day | ORAL | 1 refills | Status: DC

## 2021-11-18 NOTE — Telephone Encounter (Signed)
? ? ?  1. Which medications need to be refilled? (please list name of each medication and dose if known) LOSARTAN 100 MG ?SPIRONOLACTONE 25 MG ?AMLODIPINE 10 MG ? ?2. Which pharmacy/location (including street and city if local pharmacy) is medication to be sent to?  [facility] ? ?3. Do they need a 30 day or 90 day supply? 90 ?  ?

## 2021-11-18 NOTE — Telephone Encounter (Signed)
Complete

## 2021-11-20 ENCOUNTER — Other Ambulatory Visit: Payer: Self-pay | Admitting: Internal Medicine

## 2022-01-08 ENCOUNTER — Other Ambulatory Visit: Payer: Self-pay | Admitting: Student

## 2022-01-13 DIAGNOSIS — Z85828 Personal history of other malignant neoplasm of skin: Secondary | ICD-10-CM | POA: Diagnosis not present

## 2022-01-13 DIAGNOSIS — L821 Other seborrheic keratosis: Secondary | ICD-10-CM | POA: Diagnosis not present

## 2022-01-13 DIAGNOSIS — D225 Melanocytic nevi of trunk: Secondary | ICD-10-CM | POA: Diagnosis not present

## 2022-01-13 DIAGNOSIS — D1801 Hemangioma of skin and subcutaneous tissue: Secondary | ICD-10-CM | POA: Diagnosis not present

## 2022-01-13 DIAGNOSIS — L570 Actinic keratosis: Secondary | ICD-10-CM | POA: Diagnosis not present

## 2022-01-13 DIAGNOSIS — D0462 Carcinoma in situ of skin of left upper limb, including shoulder: Secondary | ICD-10-CM | POA: Diagnosis not present

## 2022-01-15 NOTE — Progress Notes (Signed)
Cardiology Office Note:    Date:  01/21/2022   ID:  Joshua Gentry, DOB 09-May-1946, MRN [id number]  PCP:  Joshua Sites, MD  Cardiologist:  Joshua Gentry:  Joshua Gentry   Referring MD: Joshua Sites, MD   Re establish CAD   History of Present Illness:    Joshua Gentry is a 76 y.o. male previously seen by Joshua Gentry Referred by Joshua Gentry for f/u CAD Hx of CABG in 1997.  Nuclear stress test in 2019 was low risk.  Ejection fraction was 55 to 65%.  He has a chronic right bundle branch block.  Since we saw him last he has done well.  He has had some problems with back pain and sciatica and has not been able to walk on his treadmill like he had been.  Over the last couple years he has gained about 30 pounds.  He denies any chest pain or any issues with his medications.  He is compliant with his CPAP.  He has been vaccinated and boosted against COVID.  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 s/p CABG 1997.   Heart attack ([place])    Hyperlipidemia    Hypertension    Obesity (BMI 30-39.9)    OSA on CPAP March 2016   Compliant   RBBB with left anterior fascicular block     Past Surgical History:  Procedure Laterality Date   COLONOSCOPY N/A 08/01/2013   Procedure: COLONOSCOPY;  Surgeon: Joshua So, MD;  Location: [facility];  Service: Gastroenterology;  Laterality: N/A;   CORONARY ARTERY BYPASS GRAFT     x4   Lens Bilateral April 2016   Replacment    LOWER LEG SOFT TISSUE TUMOR EXCISION      Current Medications: Current Meds  Medication Sig   amLODipine (NORVASC) 10 MG tablet Take 1 tablet (10 mg total) by mouth daily.   aspirin 81 MG tablet Take 81 mg by mouth daily.   calcium-vitamin D 250-100 MG-UNIT per tablet Take 1  tablet by mouth 2 (two) times daily.   cholecalciferol (VITAMIN D) 1000 units tablet Take 1,000 Units by mouth daily.   Coenzyme Q10 (CO Q 10) 100 MG CAPS Take 100 mg by mouth daily.   glucosamine-chondroitin 500-400 MG tablet Take 1 tablet by mouth daily.   hydrochlorothiazide (HYDRODIURIL) 25 MG tablet TAKE 1 TABLET(25 MG) BY MOUTH DAILY   losartan (COZAAR) 100 MG tablet TAKE 1 TABLET(100 MG) BY MOUTH DAILY   Magnesium 400 MG CAPS Take 400 mg by mouth daily.   metoprolol tartrate (LOPRESSOR) 100 MG tablet Take 1 tablet (100 mg total) by mouth 2 (two) times daily. Please keep upcoming appt for future refills.   Multiple Vitamins-Minerals (MULTIVITAMIN WITH MINERALS) tablet Take 1 tablet by mouth daily.   potassium chloride SA (KLOR-CON) 20 MEQ tablet Take 2 tablets (40 mEq total) by mouth daily.   rosuvastatin (CRESTOR) 20 MG tablet TAKE 1 TABLET(20 MG) BY MOUTH DAILY   spironolactone (ALDACTONE) 25 MG tablet TAKE 1 TABLET(25 MG) BY MOUTH DAILY   [DISCONTINUED] nitroGLYCERIN (NITROSTAT) 0.4 MG SL tablet DISSOLVE ONE TABLET UNDER THE TONGUE EVERY 5 MINUTES AS NEEDED FOR CHEST PAIN.  DO NOT  EXCEED A TOTAL OF 3 DOSES IN 15 MINUTES     Allergies:   Niacin and Penicillins   Social History   Socioeconomic History   Marital status: Married    Spouse name: Not on file   Number of children: 3   Years of education: College   Highest education level: Not on file  Occupational History   Occupation: Dance movement psychotherapist  Tobacco Use   Smoking status: Former    Packs/day: 1.50    Years: 32.00    Pack years: 48.00    Types: Cigarettes    Quit date: 08/17/1996    Years since quitting: 25.4   Smokeless tobacco: Never  Vaping Use   Vaping Use: Never used  Substance and Sexual Activity   Alcohol use: Yes    Alcohol/week: 0.0 standard drinks    Comment: Occasional    Drug use: No   Sexual activity: Not on file  Other Topics Concern   Not on file  Social History Narrative   Married with 3  children   No regular exercise   Drinks about 48oz of coke a day    Social Determinants of Radio broadcast assistant Strain: Not on file  Food Insecurity: Not on file  Transportation Needs: Not on file  Physical Activity: Not on file  Stress: Not on file  Social Connections: Not on file     Family History: The patient's family history includes Diabetes in his brother and mother; Heart attack in his father; Hypertension in his mother.  ROS:   Please see the history of present illness.     All other systems reviewed and are negative.  EKGs/Labs/Other Studies Reviewed:    The following studies were reviewed today: Myoview 08/01/2018- There was no ST segment deviation noted during stress. Findings consistent with prior inferior/inferolateral/apical myocardial infarction. No current myocardium at jeopardy. The left ventricular ejection fraction is normal (55-65%). This is a low risk study.     EKG:  01/21/2022 SR rate 59 RBBB LAFB PR 184 msec   Recent Labs: No results found for requested labs within last 8760 hours.  Recent Lipid Panel    Component Value Date/Time   CHOL 114 10/25/2018 0813   TRIG 140 10/25/2018 0813   HDL 35 (L) 10/25/2018 0813   CHOLHDL 3.3 10/25/2018 0813   VLDL 29 10/07/2015 0733   LDLCALC 57 10/25/2018 0813    Physical Exam:    VS:  BP 128/64   Pulse 76   Ht '6\' 1"'$  (1.854 m)   Wt 251 lb (113.9 kg)   SpO2 98%   BMI 33.12 kg/m     Wt Readings from Last 3 Encounters:  01/21/22 251 lb (113.9 kg)  09/29/21 257 lb (116.6 kg)  09/23/20 290 lb (131.5 kg)     Affect appropriate Overweight male  HEENT: normal Neck supple with no adenopathy JVP normal no bruits no thyromegaly Lungs clear with no wheezing and good diaphragmatic motion Heart:  S1/S2 no murmur, no rub, gallop or click PMI normal post sternotomy  Abdomen: benighn, BS positve, no tenderness, no AAA no bruit.  No HSM or HJR Distal pulses intact with no bruits No edema Neuro  non-focal Skin warm and dry No muscular weakness    PLAN:    Hx of CABG CABG 1997- low risk Myoview Dec 2019 stable SL nitro called in    Essential hypertension Controlled- he is on an ARB, Spironolactone, and K+ supplement-      OSA  on CPAP Compliant   Obesity (BMI 30-39.9) BMI 38   Dyslipidemia, goal LDL below 70 PCP follows- last LDL 57 labs with primary   F/U in a year    Patient Instructions  Medication Instructions:  Your physician recommends that you continue on your current medications as directed. Please refer to the Current Medication list given to you today.  *If you need a refill on your cardiac medications before your next appointment, please call your pharmacy*   Lab Work: Joshua Gentry   If you have labs (blood work) drawn today and your tests are completely normal, you will receive your results only by: [place] (if you have MyChart) OR A paper copy in the mail If you have any lab test that is abnormal or we need to change your treatment, we will call you to review the results.   Testing/Procedures: Joshua Gentry    Follow-Up: At [facility], you and your health needs are our priority.  As part of our continuing mission to provide you with exceptional heart care, we have created designated Provider Care Teams.  These Care Teams include your primary Cardiologist (physician) and Advanced Practice Providers (APPs -  Physician Assistants and Nurse Practitioners) who all work together to provide you with the care you need, when you need it.  We recommend signing up for the patient portal called "MyChart".  Sign up information is provided on this After Visit Summary.  MyChart is used to connect with patients for Virtual Visits (Telemedicine).  Patients are able to view lab/test results, encounter notes, upcoming appointments, etc.  Non-urgent messages can be sent to your provider as well.   To learn more about what you can do with MyChart, go to  [email or web address].    Your next appointment:   1 year(s)  The format for your next appointment:   In Person  Provider:   Jenkins Rouge, MD    Other Instructions Thank you for choosing [facility]!    Important Information About Sugar         Signed, Jenkins Rouge, MD  01/21/2022 10:48 AM    [facility]

## 2022-01-21 ENCOUNTER — Encounter: Payer: Self-pay | Admitting: Cardiovascular Disease

## 2022-01-21 ENCOUNTER — Ambulatory Visit (INDEPENDENT_AMBULATORY_CARE_PROVIDER_SITE_OTHER): Payer: Medicare Other | Admitting: Cardiovascular Disease

## 2022-01-21 VITALS — BP 128/64 | HR 76 | Ht 73.0 in | Wt 251.0 lb

## 2022-01-21 DIAGNOSIS — G4733 Obstructive sleep apnea (adult) (pediatric): Secondary | ICD-10-CM | POA: Diagnosis not present

## 2022-01-21 DIAGNOSIS — Z9989 Dependence on other enabling machines and devices: Secondary | ICD-10-CM | POA: Diagnosis not present

## 2022-01-21 DIAGNOSIS — I10 Essential (primary) hypertension: Secondary | ICD-10-CM | POA: Diagnosis not present

## 2022-01-21 DIAGNOSIS — E785 Hyperlipidemia, unspecified: Secondary | ICD-10-CM | POA: Diagnosis not present

## 2022-01-21 DIAGNOSIS — Z951 Presence of aortocoronary bypass graft: Secondary | ICD-10-CM

## 2022-01-21 MED ORDER — NITROGLYCERIN 0.4 MG SL SUBL: SUBLINGUAL_TABLET | SUBLINGUAL | 3 refills | Status: DC

## 2022-01-21 NOTE — Patient Instructions (Signed)
Medication Instructions:  ?Your physician recommends that you continue on your current medications as directed. Please refer to the Current Medication list given to you today. ? ?*If you need a refill on your cardiac medications before your next appointment, please call your pharmacy* ? ? ?Lab Work: ?NONE  ? ?If you have labs (blood work) drawn today and your tests are completely normal, you will receive your results only by: ?MyChart Message (if you have MyChart) OR ?A paper copy in the mail ?If you have any lab test that is abnormal or we need to change your treatment, we will call you to review the results. ? ? ?Testing/Procedures: ?NONE  ? ? ?Follow-Up: ?At [facility], you and your health needs are our priority.  As part of our continuing mission to provide you with exceptional heart care, we have created designated Provider Care Teams.  These Care Teams include your primary Cardiologist (physician) and Advanced Practice Providers (APPs -  Physician Assistants and Nurse Practitioners) who all work together to provide you with the care you need, when you need it. ? ?We recommend signing up for the patient portal called "MyChart".  Sign up information is provided on this After Visit Summary.  MyChart is used to connect with patients for Virtual Visits (Telemedicine).  Patients are able to view lab/test results, encounter notes, upcoming appointments, etc.  Non-urgent messages can be sent to your provider as well.   ?To learn more about what you can do with MyChart, go to [email or web address].   ? ?Your next appointment:   ?1 year(s) ? ?The format for your next appointment:   ?In Person ? ?Provider:   ?Peter Nishan, MD  ? ? ?Other Instructions ?Thank you for choosing [facility]! ? ? ? ?Important Information About Sugar ? ? ? ? ? ? ?

## 2022-02-13 DIAGNOSIS — I10 Essential (primary) hypertension: Secondary | ICD-10-CM | POA: Diagnosis not present

## 2022-02-13 DIAGNOSIS — E782 Mixed hyperlipidemia: Secondary | ICD-10-CM | POA: Diagnosis not present

## 2022-02-13 DIAGNOSIS — I25708 Atherosclerosis of coronary artery bypass graft(s), unspecified, with other forms of angina pectoris: Secondary | ICD-10-CM | POA: Diagnosis not present

## 2022-02-19 ENCOUNTER — Other Ambulatory Visit: Payer: Self-pay | Admitting: Internal Medicine

## 2022-04-05 ENCOUNTER — Other Ambulatory Visit: Payer: Self-pay | Admitting: Internal Medicine

## 2022-04-09 ENCOUNTER — Other Ambulatory Visit: Payer: Self-pay | Admitting: Student

## 2022-05-13 ENCOUNTER — Other Ambulatory Visit: Payer: Self-pay | Admitting: Internal Medicine

## 2022-05-16 ENCOUNTER — Other Ambulatory Visit: Payer: Self-pay | Admitting: Internal Medicine

## 2022-05-18 NOTE — Telephone Encounter (Signed)
This is a [place] pt.  °

## 2022-05-21 ENCOUNTER — Other Ambulatory Visit: Payer: Self-pay | Admitting: Internal Medicine

## 2022-05-21 DIAGNOSIS — Z23 Encounter for immunization: Secondary | ICD-10-CM | POA: Diagnosis not present

## 2022-06-08 DIAGNOSIS — E7849 Other hyperlipidemia: Secondary | ICD-10-CM | POA: Diagnosis not present

## 2022-06-08 DIAGNOSIS — I2510 Atherosclerotic heart disease of native coronary artery without angina pectoris: Secondary | ICD-10-CM | POA: Diagnosis not present

## 2022-06-08 DIAGNOSIS — C4021 Malignant neoplasm of long bones of right lower limb: Secondary | ICD-10-CM | POA: Diagnosis not present

## 2022-06-08 DIAGNOSIS — Z6834 Body mass index (BMI) 34.0-34.9, adult: Secondary | ICD-10-CM | POA: Diagnosis not present

## 2022-06-08 DIAGNOSIS — Z0001 Encounter for general adult medical examination with abnormal findings: Secondary | ICD-10-CM | POA: Diagnosis not present

## 2022-06-08 DIAGNOSIS — E782 Mixed hyperlipidemia: Secondary | ICD-10-CM | POA: Diagnosis not present

## 2022-06-08 DIAGNOSIS — I10 Essential (primary) hypertension: Secondary | ICD-10-CM | POA: Diagnosis not present

## 2022-06-08 DIAGNOSIS — Z1331 Encounter for screening for depression: Secondary | ICD-10-CM | POA: Diagnosis not present

## 2022-06-08 DIAGNOSIS — I25708 Atherosclerosis of coronary artery bypass graft(s), unspecified, with other forms of angina pectoris: Secondary | ICD-10-CM | POA: Diagnosis not present

## 2022-06-08 DIAGNOSIS — E6609 Other obesity due to excess calories: Secondary | ICD-10-CM | POA: Diagnosis not present

## 2022-07-07 DIAGNOSIS — C498 Malignant neoplasm of overlapping sites of connective and soft tissue: Secondary | ICD-10-CM | POA: Diagnosis not present

## 2022-07-07 DIAGNOSIS — C499 Malignant neoplasm of connective and soft tissue, unspecified: Secondary | ICD-10-CM | POA: Diagnosis not present

## 2022-07-16 DIAGNOSIS — D1801 Hemangioma of skin and subcutaneous tissue: Secondary | ICD-10-CM | POA: Diagnosis not present

## 2022-07-16 DIAGNOSIS — L570 Actinic keratosis: Secondary | ICD-10-CM | POA: Diagnosis not present

## 2022-07-16 DIAGNOSIS — L821 Other seborrheic keratosis: Secondary | ICD-10-CM | POA: Diagnosis not present

## 2022-07-16 DIAGNOSIS — L814 Other melanin hyperpigmentation: Secondary | ICD-10-CM | POA: Diagnosis not present

## 2022-07-16 DIAGNOSIS — Z85828 Personal history of other malignant neoplasm of skin: Secondary | ICD-10-CM | POA: Diagnosis not present

## 2022-07-16 DIAGNOSIS — C44712 Basal cell carcinoma of skin of right lower limb, including hip: Secondary | ICD-10-CM | POA: Diagnosis not present

## 2022-11-06 ENCOUNTER — Other Ambulatory Visit: Payer: Self-pay | Admitting: Cardiovascular Disease

## 2022-11-09 ENCOUNTER — Other Ambulatory Visit: Payer: Self-pay | Admitting: Internal Medicine

## 2022-12-02 ENCOUNTER — Other Ambulatory Visit: Payer: Self-pay | Admitting: Cardiovascular Disease

## 2022-12-18 ENCOUNTER — Telehealth: Payer: Self-pay | Admitting: Cardiovascular Disease

## 2022-12-18 MED ORDER — SPIRONOLACTONE 25 MG PO TABS: ORAL_TABLET | ORAL | 1 refills | Status: DC

## 2022-12-18 NOTE — Telephone Encounter (Signed)
 *  STAT* If patient is at the pharmacy, call can be transferred to refill team.   1. Which medications need to be refilled? (please list name of each medication and dose if known)   spironolactone (ALDACTONE) 25 MG tablet     2. Which pharmacy/location (including street and city if local pharmacy) is medication to be sent to?  [facility] [phone] - [place], [place] - [address] AT [facility] [address]    3. Do they need a 30 day or 90 day supply? 90 days

## 2022-12-18 NOTE — Telephone Encounter (Signed)
Refill completed.

## 2022-12-23 DIAGNOSIS — M48062 Spinal stenosis, lumbar region with neurogenic claudication: Secondary | ICD-10-CM | POA: Diagnosis not present

## 2022-12-23 DIAGNOSIS — Z6835 Body mass index (BMI) 35.0-35.9, adult: Secondary | ICD-10-CM | POA: Diagnosis not present

## 2022-12-24 ENCOUNTER — Other Ambulatory Visit: Payer: Self-pay | Admitting: Cardiology

## 2023-01-07 DIAGNOSIS — M48062 Spinal stenosis, lumbar region with neurogenic claudication: Secondary | ICD-10-CM | POA: Diagnosis not present

## 2023-01-14 DIAGNOSIS — Z85828 Personal history of other malignant neoplasm of skin: Secondary | ICD-10-CM | POA: Diagnosis not present

## 2023-01-14 DIAGNOSIS — L814 Other melanin hyperpigmentation: Secondary | ICD-10-CM | POA: Diagnosis not present

## 2023-01-14 DIAGNOSIS — L821 Other seborrheic keratosis: Secondary | ICD-10-CM | POA: Diagnosis not present

## 2023-01-14 DIAGNOSIS — D1801 Hemangioma of skin and subcutaneous tissue: Secondary | ICD-10-CM | POA: Diagnosis not present

## 2023-01-14 DIAGNOSIS — L570 Actinic keratosis: Secondary | ICD-10-CM | POA: Diagnosis not present

## 2023-01-14 DIAGNOSIS — D0472 Carcinoma in situ of skin of left lower limb, including hip: Secondary | ICD-10-CM | POA: Diagnosis not present

## 2023-01-14 DIAGNOSIS — C44719 Basal cell carcinoma of skin of left lower limb, including hip: Secondary | ICD-10-CM | POA: Diagnosis not present

## 2023-01-22 ENCOUNTER — Encounter: Payer: Self-pay | Admitting: Student

## 2023-01-22 ENCOUNTER — Other Ambulatory Visit: Payer: Self-pay

## 2023-01-22 ENCOUNTER — Ambulatory Visit: Payer: Medicare Other | Attending: Student | Admitting: Student

## 2023-01-22 VITALS — BP 110/64 | HR 60 | Ht 73.0 in | Wt 260.0 lb

## 2023-01-22 DIAGNOSIS — G4733 Obstructive sleep apnea (adult) (pediatric): Secondary | ICD-10-CM

## 2023-01-22 DIAGNOSIS — I10 Essential (primary) hypertension: Secondary | ICD-10-CM | POA: Diagnosis not present

## 2023-01-22 DIAGNOSIS — E785 Hyperlipidemia, unspecified: Secondary | ICD-10-CM

## 2023-01-22 DIAGNOSIS — I2510 Atherosclerotic heart disease of native coronary artery without angina pectoris: Secondary | ICD-10-CM | POA: Diagnosis not present

## 2023-01-22 MED ORDER — METOPROLOL SUCCINATE ER 100 MG PO TB24: 100.0000 mg | ORAL_TABLET | Freq: Every day | ORAL | 3 refills | Status: DC

## 2023-01-22 MED ORDER — LOSARTAN POTASSIUM 100 MG PO TABS: 100.0000 mg | ORAL_TABLET | Freq: Every day | ORAL | 3 refills | Status: DC

## 2023-01-22 MED ORDER — POTASSIUM CHLORIDE CRYS ER 20 MEQ PO TBCR: 40.0000 meq | EXTENDED_RELEASE_TABLET | Freq: Every day | ORAL | 2 refills | Status: DC

## 2023-01-22 MED ORDER — HYDROCHLOROTHIAZIDE 25 MG PO TABS: 25.0000 mg | ORAL_TABLET | Freq: Every day | ORAL | 3 refills | Status: DC

## 2023-01-22 NOTE — Progress Notes (Signed)
   Cardiology Office Note    Date:  01/22/2023  ID:  Joshua Gentry, DOB 27-Apr-1946, MRN [id number] Cardiologist: Charlton Haws, MD    History of Present Illness:    Joshua Gentry is a 77 y.o. male with past medical history of CAD (s/p CABG in 1997, low-risk NST in 07/2018), RBBB, HTN, HLD and OSA who presents to the office today for annual follow-up.  He was examined by Dr. Eden Emms in 01/2022 and reported having back pain and sciatica but denied any specific anginal symptoms. He was using a CPAP at night. He was continued on his current cardiac medications with ASA 81 mg daily, Amlodipine 10 mg daily, HCTZ 25 mg daily, Losartan 100 mg daily, Lopressor 100 mg twice daily, Spironolactone 25 mg daily and Crestor 20 mg daily.  In talking to the patient today, he reports overall feeling well since his last office visit. He remains active in doing chores around the house and helping take care of his grandchildren. He denies any recent chest pain or dyspnea on exertion with these activities. No recent orthopnea, PND or pitting edema. He does use his CPAP on a nightly basis. In reviewing his medications, he has only been taking his Lopressor once daily as he frequently forgets his evening dose. We discussed switching to Toprol-XL for sustained release.  Studies Reviewed:   EKG: EKG is ordered today and demonstrates NSR, HR 60 with LAD and known RBBB.   NST: 07/2018 There was no ST segment deviation noted during stress. Findings consistent with prior inferior/inferolateral/apical myocardial infarction. No current myocardium at jeopardy. The left ventricular ejection fraction is normal (55-65%). This is a low risk study.   Physical Exam:   VS:  BP 110/64   Pulse 60   Ht 6\' 1"  (1.854 m)   Wt 260 lb (117.9 kg)   SpO2 97%   BMI 34.30 kg/m    Wt Readings from Last 3 Encounters:  01/22/23 260 lb (117.9 kg)  01/21/22 251 lb (113.9 kg)  09/29/21 257 lb (116.6 kg)     GEN: Pleasant male  appearing in no acute distress NECK: No JVD; No carotid bruits CARDIAC: RRR, no murmurs, rubs, gallops RESPIRATORY:  Clear to auscultation without rales, wheezing or rhonchi  ABDOMEN: Appears non-distended. No obvious abdominal masses. EXTREMITIES: No clubbing or cyanosis. No pitting edema.  Distal pedal pulses are 2+ bilaterally.   Assessment and Plan:   1. CAD - He is s/p CABG in 1997 with low-risk NST in 07/2018. He remains active at baseline and denies any recent anginal symptoms. - Continue current medical therapy with ASA 81 mg daily and Crestor 20 mg daily.  Will switch Lopressor to Toprol-XL 100 mg daily as discussed above.  2. HTN - His blood pressure is well-controlled at 110/64 during today's visit. Will continue Amlodipine 10 mg daily, HCTZ 25 mg daily, Spironolactone 25 mg daily and Losartan 100 mg daily. Will adjust Lopressor from 100 mg daily to Toprol-XL 100 mg daily for sustained release given that he has been taking Lopressor once daily.  3. HLD - Followed by his PCP.  LDL was well-controlled at 61 in 05/2022 and LFT's WNL. Will continue current medical therapy with Crestor 20 mg daily.  4. OSA - Continued compliance with CPAP encouraged.    Signed, Ellsworth Lennox, PA-C

## 2023-01-22 NOTE — Patient Instructions (Signed)
Medication Instructions:   Stop Taking Lopressor   Start Taking Toprol XL 100 mg Daily   *If you need a refill on your cardiac medications before your next appointment, please call your pharmacy*   Lab Work: NONE   If you have labs (blood work) drawn today and your tests are completely normal, you will receive your results only by: MyChart Message (if you have MyChart) OR A paper copy in the mail If you have any lab test that is abnormal or we need to change your treatment, we will call you to review the results.   Testing/Procedures: NONE    Follow-Up: At [facility], you and your health needs are our priority.  As part of our continuing mission to provide you with exceptional heart care, we have created designated Provider Care Teams.  These Care Teams include your primary Cardiologist (physician) and Advanced Practice Providers (APPs -  Physician Assistants and Nurse Practitioners) who all work together to provide you with the care you need, when you need it.  We recommend signing up for the patient portal called "MyChart".  Sign up information is provided on this After Visit Summary.  MyChart is used to connect with patients for Virtual Visits (Telemedicine).  Patients are able to view lab/test results, encounter notes, upcoming appointments, etc.  Non-urgent messages can be sent to your provider as well.   To learn more about what you can do with MyChart, go to [email or web address].    Your next appointment:   1 year(s)  Provider:   Charlton Haws, MD or Randall An, PA-C    Other Instructions Thank you for choosing [facility]!

## 2023-02-01 NOTE — Progress Notes (Signed)
Order(s) created erroneously. Erroneous order ID: [id number]  Order moved by: Leonia Corona  Order move date/time: 02/01/2023 4:30 PM  Source Patient: [id number]  Source Contact: 01/22/2023  Destination Patient: [id number]  Destination Contact: 01/27/2023

## 2023-02-15 ENCOUNTER — Ambulatory Visit (HOSPITAL_COMMUNITY): Payer: Medicare Other | Attending: Neurosurgery

## 2023-02-15 ENCOUNTER — Other Ambulatory Visit: Payer: Self-pay

## 2023-02-15 DIAGNOSIS — Z7409 Other reduced mobility: Secondary | ICD-10-CM | POA: Insufficient documentation

## 2023-02-15 DIAGNOSIS — M545 Low back pain, unspecified: Secondary | ICD-10-CM | POA: Insufficient documentation

## 2023-02-15 DIAGNOSIS — R262 Difficulty in walking, not elsewhere classified: Secondary | ICD-10-CM | POA: Insufficient documentation

## 2023-02-15 NOTE — Therapy (Signed)
OUTPATIENT PHYSICAL THERAPY THORACOLUMBAR EVALUATION   Patient Name: Joshua Gentry MRN: [id number] DOB:[birth year], 77 y.o., male Today's Date: 02/15/2023  END OF SESSION:  PT End of Session - 02/15/23 0951     Visit Number 1    Number of Visits 8    Date for PT Re-Evaluation 03/15/23    Authorization Type Medicare Part A    PT Start Time 0950    PT Stop Time 1030    PT Time Calculation (min) 40 min    Activity Tolerance Patient tolerated treatment well    Behavior During Therapy [facility] for tasks assessed/performed             Past Medical History:  Diagnosis Date   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 s/p CABG 1997.   Heart attack (HCC)    Hyperlipidemia    Hypertension    Obesity (BMI 30-39.9)    OSA on CPAP March 2016   Compliant   RBBB with left anterior fascicular block    Past Surgical History:  Procedure Laterality Date   COLONOSCOPY N/A 08/01/2013   Procedure: COLONOSCOPY;  Surgeon: Dalia Heading, MD;  Location: [facility];  Service: Gastroenterology;  Laterality: N/A;   CORONARY ARTERY BYPASS GRAFT     x4   Lens Bilateral April 2016   Replacment    LOWER LEG SOFT TISSUE TUMOR EXCISION     Patient Active Problem List   Diagnosis Date Noted   OSA on CPAP 04/09/2015   Liposarcoma (HCC) 07/06/2013   RBBB plus LA hemiblock 03/21/2013   Obesity (BMI 30-39.9) 07/22/2009   Dyslipidemia, goal LDL below 70 01/09/2009   Essential hypertension 01/09/2009   Hx of CABG 01/09/2009    PCP: Assunta Found, MD  REFERRING PROVIDER: Lisbeth Renshaw, MD  REFERRING DIAG: [phone] (ICD-10-CM) - Spinal stenosis, lumbar region with neurogenic claudication  Rationale for Evaluation and Treatment: Rehabilitation  THERAPY DIAG:  Low back pain, unspecified back pain laterality, unspecified chronicity, unspecified whether sciatica present  ONSET DATE: 2019  SUBJECTIVE:                                                                                                                                                                                            SUBJECTIVE STATEMENT: Tripped back in 2019; caused him issues with his back and down his legs; ended up doing some therapy and having an epidural injection that helped him a lot;   Now pain has returned over the past 2 months; notices especially with playing golf and with prolonged standing.  Saw  Nundkumar, who ordered another epidural and physical therapy.    PERTINENT HISTORY:  Chronic leg and back pain; had epidural injection about a month ago.    PAIN:  Are you having pain? Yes: NPRS scale: at most 7/10; best 0 currently 3/10 Pain location: mid back mostly; occasionally in legs to knees Pain description: aching Aggravating factors: prolonged standing, walking, prolonged sitting Relieving factors: laying down, relaxing; doesn't bother him sleeping  PRECAUTIONS: None  WEIGHT BEARING RESTRICTIONS: No  FALLS:  Has patient fallen in last 6 months? Yes. Number of falls 1-2  OCCUPATION: retired  PLOF: Independent  PATIENT GOALS: like be able to play golf without pain  NEXT MD VISIT: 02/17/23  OBJECTIVE:   DIAGNOSTIC FINDINGS:  CLINICAL DATA:  Low back pain extending down legs, 6 weeks duration.   EXAM: MRI LUMBAR SPINE WITHOUT CONTRAST   TECHNIQUE: Multiplanar, multisequence MR imaging of the lumbar spine was performed. No intravenous contrast was administered.   COMPARISON:  None.   FINDINGS: Segmentation:  5 lumbar type vertebral bodies.   Alignment:  Normal except for 2 mm retrolisthesis L2-3 and L3-4.   Vertebrae:  No fracture or primary bone lesion.   Conus medullaris and cauda equina: Conus extends to the L1 level. Conus and cauda equina appear normal.   Paraspinal and other soft tissues: Negative   Disc levels:   Likely congenital diminutive discs at T11-12, T12-L1 and L1-2. No stenosis or neural compression at those levels.    L2-3: 2 mm retrolisthesis. Mild bulging of the disc. Mild facet and ligamentous hypertrophy. Mild narrowing of both lateral recesses but without demonstrable neural compression.   L3-4: 2 mm retrolisthesis. Mild bulging of the disc. Facet and ligamentous hypertrophy. Stenosis of the canal and both lateral recesses that could cause neural compression on either or both sides. Some edema between the spinous processes likely indicating abutment, which could be associated with some back pain.   L4-5: Endplate osteophytes and mild bulging of the disc. Facet and ligamentous hypertrophy. Stenosis of both lateral recesses that could cause neural compression.   L5-S1: Minimal disc bulge. Facet osteoarthritis. No canal or foraminal stenosis.   IMPRESSION: L3-4: Multifactorial canal stenosis and bilateral lateral recess stenosis that could cause neural compression on either or both sides. Findings at this level could also be associated with back pain.   L4-5: Bilateral lateral recess stenosis that could cause neural compression on either or both sides. Findings could also be associated with back pain.   L5-S1: Facet osteoarthritis which could be associated with back pain. No stenosis or neural compression.   L2-3: Mild bilateral lateral recess stenosis without visible neural compression.  PATIENT SURVEYS:  Modified Oswestry 15/50; 30%   COGNITION: Overall cognitive status: Within functional limits for tasks assessed     SENSATION: WFL  POSTURE: rounded shoulders, forward head, and decreased lumbar lordosis  PALPATION: General tenderness lumbar spine  LUMBAR ROM:   AROM eval  Flexion 60% available  Extension 10% available  Right lateral flexion 6" above knee  Left lateral flexion 6" above knee  Right rotation   Left rotation    (Blank rows = not tested)  LOWER EXTREMITY ROM:     Active  Right eval Left eval  Hip flexion    Hip extension    Hip abduction    Hip  adduction    Hip internal rotation    Hip external rotation    Knee flexion    Knee extension    Ankle  dorsiflexion    Ankle plantarflexion    Ankle inversion    Ankle eversion     (Blank rows = not tested)  LOWER EXTREMITY MMT:    MMT Right eval Left eval  Hip flexion 4+ 4+  Hip extension    Hip abduction    Hip adduction    Hip internal rotation    Hip external rotation    Knee flexion    Knee extension 5 5  Ankle dorsiflexion 5 5  Ankle plantarflexion    Ankle inversion    Ankle eversion     (Blank rows = not tested)    FUNCTIONAL TESTS:  5 times sit to stand: 13.34 sec no UE assist  GAIT: Distance walked: 50 ft in clinic Assistive device utilized: None Level of assistance: Complete Independence Comments: decreased trunk rotation  TODAY'S TREATMENT:                                                                                                                              DATE: 02/15/23 physical therapy evaluation and HEP instruction    PATIENT EDUCATION:  Education details: Patient educated on exam findings, POC, scope of PT, HEP, and what to expect next visit. Person educated: Patient Education method: Explanation, Demonstration, and Handouts Education comprehension: verbalized understanding, returned demonstration, verbal cues required, and tactile cues required  HOME EXERCISE PROGRAM: Access Code: [email or web address] URL: [email or web address] Date: 02/15/2023 Prepared by: [facility] - [facility]  Exercises - Supine Lower Trunk Rotation  - 2 x daily - 7 x weekly - 1 sets - 10 reps - Seated Hamstring Stretch  - 2 x daily - 7 x weekly - 1 sets - 10 reps - 10 sec hold - Seated Piriformis Stretch with Trunk Bend  - 2 x daily - 7 x weekly - 1 sets - 10 reps - 10 sec hold  ASSESSMENT:  CLINICAL IMPRESSION: Patient is a 77 y.o. pleasant male who was seen today for physical therapy evaluation and treatment for back pain.  Patient demonstrates muscle weakness,  reduced ROM, and fascial restrictions which are likely contributing to symptoms of pain and are negatively impacting patient ability to perform ADLs and functional mobility tasks. Patient will benefit from skilled physical therapy services to address these deficits to reduce pain and improve level of function with ADLs and functional mobility tasks.   OBJECTIVE IMPAIRMENTS: decreased activity tolerance, decreased knowledge of condition, decreased mobility, difficulty walking, decreased ROM, decreased strength, hypomobility, increased fascial restrictions, impaired perceived functional ability, and pain.   ACTIVITY LIMITATIONS: carrying, lifting, bending, sitting, standing, squatting, and locomotion level  PARTICIPATION LIMITATIONS: meal prep, cleaning, laundry, shopping, community activity, yard work, and Freight forwarder POTENTIAL: Good  CLINICAL DECISION MAKING: Stable/uncomplicated  EVALUATION COMPLEXITY: Low   GOALS: Goals reviewed with patient? No  SHORT TERM GOALS: Target date: 03/01/2023  patient will be independent with initial HEP   Baseline: Goal status: INITIAL  2.  Patient will self  report 30% improvement to improve tolerance for functional activity  Baseline:  Goal status: INITIAL  LONG TERM GOALS: Target date: 03/15/2023  Patient will be independent in self management strategies to improve quality of life and functional outcomes.  Baseline:  Goal status: INITIAL  2.  Patient will self report 50% improvement to improve tolerance for functional activity  Baseline:  Goal status: INITIAL  3.  Patient will be able to stand/walk x 15 minutes without back pain > 3/10 to play golf more efficiently Baseline:  Goal status: INITIAL  4.  Patient will improve AROM of lumbar spine all ranges without pain to wfl throughout Baseline:  Goal status: INITIAL  5.  Patient will improve Modified Oswestry score to 10/50 or less  Baseline: 15/50 Goal status:  INITIAL  PLAN:  PT FREQUENCY: 2x/week  PT DURATION: 4 weeks  PLANNED INTERVENTIONS: Therapeutic exercises, Therapeutic activity, Neuromuscular re-education, Balance training, Gait training, Patient/Family education, Joint manipulation, Joint mobilization, Stair training, Orthotic/Fit training, DME instructions, Aquatic Therapy, Dry Needling, Electrical stimulation, Spinal manipulation, Spinal mobilization, Cryotherapy, Moist heat, Compression bandaging, scar mobilization, Splintting, Taping, Traction, Ultrasound, Ionotophoresis 4mg /ml Dexamethasone, and Manual therapy .  PLAN FOR NEXT SESSION: Review HEP and goals; test glute and hamstring strength; lumbar mobility and postural strengthening   10:59 AM, 02/15/23 Londyn Wotton Small Jacion Dismore MPT [place] [facility] [place] [phone] [phone]

## 2023-02-17 ENCOUNTER — Other Ambulatory Visit: Payer: Self-pay | Admitting: Cardiovascular Disease

## 2023-02-17 DIAGNOSIS — Z6834 Body mass index (BMI) 34.0-34.9, adult: Secondary | ICD-10-CM | POA: Diagnosis not present

## 2023-02-17 DIAGNOSIS — M48062 Spinal stenosis, lumbar region with neurogenic claudication: Secondary | ICD-10-CM | POA: Diagnosis not present

## 2023-02-24 ENCOUNTER — Ambulatory Visit (HOSPITAL_COMMUNITY): Payer: Medicare Other | Admitting: Physical Therapy

## 2023-02-24 DIAGNOSIS — R262 Difficulty in walking, not elsewhere classified: Secondary | ICD-10-CM | POA: Diagnosis not present

## 2023-02-24 DIAGNOSIS — M545 Low back pain, unspecified: Secondary | ICD-10-CM | POA: Diagnosis not present

## 2023-02-24 DIAGNOSIS — Z7409 Other reduced mobility: Secondary | ICD-10-CM | POA: Diagnosis not present

## 2023-02-24 NOTE — Therapy (Signed)
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 s/p CABG 1997.   Heart attack (HCC)    Hyperlipidemia    Hypertension    Obesity (BMI 30-39.9)    OSA on CPAP March 2016   Compliant   RBBB with left anterior fascicular block    Past Surgical History:  Procedure Laterality Date   COLONOSCOPY N/A 08/01/2013   Procedure: COLONOSCOPY;  Surgeon: Dalia Heading, MD;  [facility];  Service: Gastroenterology;  Laterality: N/A;   CORONARY ARTERY BYPASS GRAFT     x4   Lens Bilateral April 2016   Replacment    LOWER LEG SOFT TISSUE TUMOR EXCISION     Patient Active Problem List   Diagnosis Date Noted   OSA on CPAP 04/09/2015   Liposarcoma (HCC) 07/06/2013   RBBB plus LA hemiblock 03/21/2013   Obesity (BMI 30-39.9) 07/22/2009   Dyslipidemia, goal LDL below 70 01/09/2009   Essential hypertension 01/09/2009   Hx of CABG 01/09/2009    PCP: Assunta Found, MD  REFERRING PROVIDER: Lisbeth Renshaw, MD  REFERRING DIAG: [phone] (ICD-10-CM) - Spinal stenosis, lumbar region with neurogenic claudication  Rationale for Evaluation and Treatment: Rehabilitation  THERAPY DIAG:  Low back pain, unspecified back pain laterality, unspecified chronicity, unspecified whether sciatica present  ONSET DATE: 2019  SUBJECTIVE:                                                                                                                                                                                            SUBJECTIVE STATEMENT: Pt states he is doing well today overall, pain remains around the same but doing his HEP.   Evaluation:Tripped back in 2019; caused him issues with his back and down his legs; ended up doing some therapy and having an epidural injection that helped him a lot;   Now pain  has returned over the past 2 months; notices especially with playing golf and with prolonged standing.  Saw Nundkumar, who ordered another epidural and physical therapy.    PERTINENT HISTORY:  Chronic leg and back pain; had epidural injection about a month ago.    PAIN:  Are you having pain? Yes: NPRS scale: at most 7/10; best 0 currently 3/10 Pain location: mid back mostly; occasionally in legs to knees Pain description: aching Aggravating factors: prolonged standing, walking, prolonged sitting Relieving factors: laying down, relaxing; doesn't bother him sleeping  PRECAUTIONS: None  WEIGHT BEARING RESTRICTIONS: No  FALLS:  Has patient fallen in last 6 months? Yes. Number of falls 1-2  OCCUPATION: retired  PLOF: Independent  PATIENT GOALS: like be able to play golf without pain  NEXT MD VISIT: 02/17/23  OBJECTIVE:   DIAGNOSTIC FINDINGS:  CLINICAL DATA:  Low back pain extending down legs, 6 weeks duration.   EXAM: MRI LUMBAR SPINE WITHOUT CONTRAST   TECHNIQUE: Multiplanar, multisequence MR imaging of the lumbar spine was performed. No intravenous contrast was administered.   COMPARISON:  None.   FINDINGS: Segmentation:  5 lumbar type vertebral bodies.   Alignment:  Normal except for 2 mm retrolisthesis L2-3 and L3-4.   Vertebrae:  No fracture or primary bone lesion.   Conus medullaris and cauda equina: Conus extends to the L1 level. Conus and cauda equina appear normal.   Paraspinal and other soft tissues: Negative   Disc levels:   Likely congenital diminutive  discs at T11-12, T12-L1 and L1-2. No stenosis or neural compression at those levels.   L2-3: 2 mm retrolisthesis. Mild bulging of the disc. Mild facet and ligamentous hypertrophy. Mild narrowing of both lateral recesses but without demonstrable neural compression.   L3-4: 2 mm retrolisthesis. Mild bulging of the disc. Facet and ligamentous hypertrophy. Stenosis of the canal and both lateral recesses that could cause neural compression on either or both sides. Some edema between the spinous processes likely indicating abutment, which could be associated with some back pain.   L4-5: Endplate osteophytes and mild bulging of the disc. Facet and ligamentous hypertrophy. Stenosis of both lateral recesses that could cause neural compression.   L5-S1: Minimal disc bulge. Facet osteoarthritis. No canal or foraminal stenosis.   IMPRESSION: L3-4: Multifactorial canal stenosis and bilateral lateral recess stenosis that could cause neural compression on either or both sides. Findings at this level could also be associated with back pain.   L4-5: Bilateral lateral recess stenosis that could cause neural compression on either or both sides. Findings could also be associated with back pain.   L5-S1: Facet osteoarthritis which could be associated with back pain. No stenosis or neural compression.   L2-3: Mild bilateral lateral recess stenosis without visible neural compression.  PATIENT SURVEYS:  Modified Oswestry 15/50; 30%   COGNITION: Overall cognitive status: Within functional limits for tasks assessed     SENSATION: WFL  POSTURE: rounded shoulders, forward head, and decreased lumbar lordosis  PALPATION: General tenderness lumbar spine  LUMBAR ROM:   AROM eval  Flexion 60% available  Extension 10% available  Right lateral flexion 6" above knee  Left lateral flexion 6" above knee  Right rotation   Left rotation    (Blank rows = not tested)  LOWER EXTREMITY MMT:    MMT  Right eval Left eval  Hip flexion 4+ 4+  Hip extension    Hip abduction    Hip adduction    Hip internal rotation    Hip  external rotation    Knee flexion    Knee extension 5 5  Ankle dorsiflexion 5 5  Ankle plantarflexion    Ankle inversion    Ankle eversion     (Blank rows = not tested)    FUNCTIONAL TESTS:  5 times sit to stand: 13.34 sec no UE assist  GAIT: Distance walked: 50 ft in clinic Assistive device utilized: None Level of assistance: Complete Independence Comments: decreased trunk rotation  TODAY'S TREATMENT:                                                                                                                              DATE:  02/24/23 Standing:  hip excursions 10X each  Seated:  piriformis stretch 2X30"  Hamstring stretch 2X30"  Sit to stands 10X no UE  Long seated hamstring stretch 30" each side Supine: lower trunk rotations 10X5"  Abdominal isometric 10X5"  Bridge 10X  SLR 10X each    02/15/23 physical therapy evaluation and HEP instruction    PATIENT EDUCATION:  Education details: Patient educated on exam findings, POC, scope of PT, HEP, and what to expect next visit. Person educated: Patient Education method: Explanation, Demonstration, and Handouts Education comprehension: verbalized understanding, returned demonstration, verbal cues required, and tactile cues required  HOME EXERCISE PROGRAM: Access Code: [email or web address] URL: [email or web address]  Date: 02/15/2023 Prepared by: [facility] - Re[facility] Exercises - Supine Lower Trunk Rotation  - 2 x daily - 7 x weekly - 1 sets - 10 reps - Seated Hamstring Stretch  - 2 x daily - 7 x weekly - 1 sets - 10 reps - 10 sec hold - Seated Piriformis Stretch with Trunk Bend  - 2 x daily - 7 x weekly - 1 sets - 10 reps - 10 sec hold  Date: 02/24/2023 Prepared by: Emeline Gins Exercises - Seated Table Hamstring Stretch  - 2 x daily - 7 x weekly - 1 sets - 3 reps - 30 sec hold - Sit to Stand  Without Arm Support  - 2 x daily - 7 x weekly - 1 sets - 10 reps - Supine Transversus Abdominis Bracing - Hands on Ground  - 2 x daily - 7 x weekly - 1 sets - 10 reps - 5 sec hold - Supine Bridge  - 2 x daily - 7 x weekly - 1 sets - 10 reps - Small Range Straight Leg Raise  - 2 x daily - 7 x weekly - 1 sets - 10 reps  ASSESSMENT:  CLINICAL IMPRESSION: Progressed session today with added stretches and core stabilization education/instruction.  Began with standing hip excursions to improve lumbar mobility.  Pt able to complete standing from standard chair without use of UE.  Instructed with alternative ways to complete hamstring stretch in seated and standing.  Pt with noted tightness bilaterally, Rt slightly worse than Lt.  Piriformis stretch instructed with mild tightness noted.  HEP updated to include added exercises today.  Pt requires cues to hold times and breathing as well as maintaining posture. Patient will benefit from skilled physical therapy services to address these deficits to reduce pain and improve level of function with ADLs and functional mobility tasks.  OBJECTIVE IMPAIRMENTS: decreased activity tolerance, decreased knowledge of condition, decreased mobility, difficulty walking, decreased ROM, decreased strength, hypomobility, increased fascial restrictions, impaired perceived functional ability, and pain.   ACTIVITY LIMITATIONS: carrying, lifting, bending, sitting, standing, squatting, and locomotion level  PARTICIPATION LIMITATIONS: meal prep, cleaning, laundry, shopping, community activity, yard work, and Freight forwarder POTENTIAL: Good  CLINICAL DECISION MAKING: Stable/uncomplicated  EVALUATION COMPLEXITY: Low   GOALS: Goals reviewed with patient? No  SHORT TERM GOALS: Target date: 03/01/2023  patient will be independent with initial HEP   Baseline: Goal status: INITIAL  2.  Patient will self report 30% improvement to improve tolerance for functional  activity  Baseline:  Goal status: INITIAL  LONG TERM GOALS: Target date: 03/15/2023  Patient will be independent in self management strategies to improve quality of life and functional outcomes.  Baseline:  Goal status: INITIAL  2.  Patient will self report 50% improvement to improve tolerance for functional activity  Baseline:  Goal status: INITIAL  3.  Patient will be able to stand/walk x 15 minutes without back pain > 3/10 to play golf more efficiently Baseline:  Goal status: INITIAL  4.  Patient will improve AROM of lumbar spine all ranges without pain to wfl throughout Baseline:  Goal status: INITIAL  5.  Patient will improve Modified Oswestry score to 10/50 or less  Baseline: 15/50 Goal status: INITIAL  PLAN:  PT FREQUENCY: 2x/week  PT DURATION: 4 weeks  PLANNED INTERVENTIONS: Therapeutic exercises, Therapeutic activity, Neuromuscular re-education, Balance training, Gait training, Patient/Family education, Joint manipulation, Joint mobilization, Stair training, Orthotic/Fit training, DME instructions, Aquatic Therapy, Dry Needling, Electrical stimulation, Spinal manipulation, Spinal mobilization, Cryotherapy, Moist heat, Compression bandaging, scar mobilization, Splintting, Taping, Traction, Ultrasound, Ionotophoresis 4mg /ml Dexamethasone, and Manual therapy .  PLAN FOR NEXT SESSION: Next session test glute and hamstring strength.  Progress lumbar mobility and postural strengthening   11:08 AM, 02/24/23 Lurena Nida, PTA/CLT [facility] Ph: [phone]

## 2023-02-26 ENCOUNTER — Encounter (HOSPITAL_COMMUNITY): Payer: Medicare Other | Admitting: Physical Therapy

## 2023-03-03 ENCOUNTER — Ambulatory Visit (HOSPITAL_COMMUNITY): Payer: Medicare Other

## 2023-03-03 ENCOUNTER — Encounter (HOSPITAL_COMMUNITY): Payer: Self-pay

## 2023-03-03 DIAGNOSIS — M545 Low back pain, unspecified: Secondary | ICD-10-CM | POA: Diagnosis not present

## 2023-03-03 DIAGNOSIS — R262 Difficulty in walking, not elsewhere classified: Secondary | ICD-10-CM | POA: Diagnosis not present

## 2023-03-03 DIAGNOSIS — Z7409 Other reduced mobility: Secondary | ICD-10-CM | POA: Diagnosis not present

## 2023-03-03 NOTE — Therapy (Signed)
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 s/p CABG 1997.   Heart attack (HCC)    Hyperlipidemia    Hypertension    Obesity (BMI 30-39.9)    OSA on CPAP March 2016   Compliant   RBBB with left anterior fascicular block    Past Surgical History:  Procedure Laterality Date   COLONOSCOPY N/A 08/01/2013   Procedure: COLONOSCOPY;  Surgeon: Dalia Heading, MD;  Location: [facility];  Service: Gastroenterology;  Laterality: N/A;   CORONARY ARTERY BYPASS GRAFT     x4   Lens Bilateral April 2016   Replacment    LOWER LEG SOFT TISSUE TUMOR EXCISION     Patient Active Problem List   Diagnosis Date Noted   OSA on CPAP 04/09/2015   Liposarcoma (HCC) 07/06/2013   RBBB plus LA hemiblock 03/21/2013   Obesity (BMI 30-39.9) 07/22/2009   Dyslipidemia, goal LDL below 70 01/09/2009   Essential hypertension 01/09/2009   Hx of CABG 01/09/2009    PCP: Assunta Found, MD  REFERRING PROVIDER: Lisbeth Renshaw, MD  REFERRING DIAG: [phone] (ICD-10-CM) - Spinal stenosis, lumbar region with neurogenic claudication  Rationale for Evaluation and Treatment: Rehabilitation  THERAPY DIAG:  Low back pain, unspecified back pain laterality, unspecified chronicity, unspecified whether sciatica present  ONSET DATE: 2019  SUBJECTIVE:                                                                                                                                                                                            SUBJECTIVE STATEMENT: Pt stated he is feeling good today, no reports of pain and has been compliant with HEP.  Reports stiffness in back and c/o some pain during activities, none at rest.  Evaluation:Tripped back in 2019; caused him issues with his back and down his legs; ended up doing some therapy and  having an epidural injection that helped him a lot;   Now pain has returned over the past 2 months; notices especially with playing golf and with prolonged standing.  Saw Nundkumar, who ordered another epidural and physical therapy.    PERTINENT HISTORY:  Chronic leg and back pain; had epidural injection about a month ago.    PAIN:  Are you having pain? Yes: NPRS scale: at most 7/10; best 0 currently 3/10 Pain location: mid back mostly; occasionally in legs to knees Pain description: aching Aggravating factors: prolonged standing, walking, prolonged sitting Relieving factors: laying down, relaxing; doesn't bother him sleeping  PRECAUTIONS: None  WEIGHT BEARING RESTRICTIONS: No  FALLS:  Has patient fallen in last 6 months? Yes. Number of falls 1-2  OCCUPATION: retired  PLOF: Independent  PATIENT GOALS: like be able to play golf without pain  NEXT MD VISIT: 02/17/23  OBJECTIVE:   DIAGNOSTIC FINDINGS:  CLINICAL DATA:  Low back pain extending down legs, 6 weeks duration.   EXAM: MRI LUMBAR SPINE WITHOUT CONTRAST   TECHNIQUE: Multiplanar, multisequence MR imaging of the lumbar spine was performed. No intravenous contrast was administered.   COMPARISON:  None.   FINDINGS: Segmentation:  5 lumbar type vertebral bodies.   Alignment:  Normal except for 2 mm retrolisthesis L2-3 and L3-4.   Vertebrae:  No fracture or primary bone lesion.   Conus medullaris and cauda equina: Conus extends to the L1 level. Conus and cauda equina appear normal.   Paraspinal and other  soft tissues: Negative   Disc levels:   Likely congenital diminutive discs at T11-12, T12-L1 and L1-2. No stenosis or neural compression at those levels.   L2-3: 2 mm retrolisthesis. Mild bulging of the disc. Mild facet and ligamentous hypertrophy. Mild narrowing of both lateral recesses but without demonstrable neural compression.   L3-4: 2 mm retrolisthesis. Mild bulging of the disc. Facet and ligamentous hypertrophy. Stenosis of the canal and both lateral recesses that could cause neural compression on either or both sides. Some edema between the spinous processes likely indicating abutment, which could be associated with some back pain.   L4-5: Endplate osteophytes and mild bulging of the disc. Facet and ligamentous hypertrophy. Stenosis of both lateral recesses that could cause neural compression.   L5-S1: Minimal disc bulge. Facet osteoarthritis. No canal or foraminal stenosis.   IMPRESSION: L3-4: Multifactorial canal stenosis and bilateral lateral recess stenosis that could cause neural compression on either or both sides. Findings at this level could also be associated with back pain.   L4-5: Bilateral lateral recess stenosis that could cause neural compression on either or both sides. Findings could also be associated with back pain.   L5-S1: Facet osteoarthritis which could be associated with back pain. No stenosis or neural compression.   L2-3: Mild bilateral lateral recess stenosis without visible neural compression.  PATIENT SURVEYS:  Modified Oswestry 15/50; 30%   COGNITION: Overall cognitive status: Within functional limits for tasks assessed     SENSATION: WFL  POSTURE: rounded shoulders, forward head, and decreased lumbar lordosis  PALPATION: General tenderness lumbar spine  LUMBAR ROM:   AROM eval  Flexion 60% available  Extension 10% available  Right lateral flexion 6" above knee  Left lateral flexion 6" above knee  Right rotation    Left rotation    (Blank rows = not tested)  LOWER EXTREMITY MMT:    MMT Right eval Left eval Right 03/03/23 Left 03/03/23  Hip flexion 4+ 4+    Hip extension  Prone: 4/5 Prone: 4-/5  Hip abduction   4/5 4  Hip adduction      Hip internal rotation      Hip external rotation      Knee flexion   5/5 5/5  Knee extension 5 5    Ankle dorsiflexion 5 5    Ankle plantarflexion      Ankle inversion      Ankle eversion       (Blank rows = not tested)    FUNCTIONAL TESTS:  5 times sit to stand: 13.34 sec no UE assist  GAIT: Distance walked: 50 ft in clinic Assistive device utilized: None Level of assistance: Complete Independence Comments: decreased trunk rotation  TODAY'S TREATMENT:                                                                                                                              DATE:  03/01/23: Standing:  hip excursions 10X each    RTB shoulder extension    Wall arch 5x  MMT (see above)  Supine: Bridge 10x  Hamstring stretch 1x with hands behind knees, 2x with rope for 30" holds  Piriformis stretch 90/90 2x 30" Quadruped: cat/camel 5x 10" Seated: abdominal sets 10x 5"  Lumbar support   Seated posture  Encouarged to adjust mirror prior leaving parking lot  Rows RTB  Shoulders up back and relax   02/24/23 Standing:  hip excursions 10X each  Seated:  piriformis stretch 2X30"  Hamstring stretch 2X30"  Sit to stands 10X no UE  Long seated hamstring stretch 30" each side Supine: lower trunk rotations 10X5"  Abdominal isometric 10X5"  Bridge 10X  SLR 10X each    02/15/23 physical therapy evaluation and HEP instruction    PATIENT EDUCATION:  Education details: Patient educated on exam findings, POC, scope of PT, HEP, and what to expect next visit. Person educated: Patient Education method: Explanation, Demonstration, and Handouts Education comprehension: verbalized understanding, returned demonstration, verbal cues required, and  tactile cues required  HOME EXERCISE PROGRAM: Access Code: [email or web address] URL: [email or web address]  Date: 02/15/2023 Prepared by: [facility] - Rehab Exercises - Supine Lower Trunk Rotation  - 2 x daily - 7 x weekly - 1 sets - 10 reps - Seated Hamstring Stretch  - 2 x daily - 7 x weekly - 1 sets - 10 reps - 10 sec hold - Seated Piriformis Stretch with Trunk Bend  - 2 x daily - 7 x weekly - 1 sets - 10 reps - 10 sec hold  Date: 02/24/2023 Prepared by: Emeline Gins Exercises - Seated Table Hamstring Stretch  - 2 x daily - 7 x weekly - 1 sets - 3 reps - 30 sec hold - Sit to Stand Without Arm Support  - 2 x daily - 7 x weekly - 1 sets - 10 reps - Supine Transversus Abdominis Bracing - Hands on Ground  - 2 x daily - 7 x weekly - 1 sets -  10 reps - 5 sec hold - Supine Bridge  - 2 x daily - 7 x weekly - 1 sets - 10 reps - Small Range Straight Leg Raise  - 2 x daily - 7 x weekly - 1 sets - 10 reps  03/03/23- Supine Figure 4 Piriformis Stretch:  - 1 x daily - 7 x weekly - 3 sets - 3 reps - 30" hold  - Seated Transversus Abdominis Bracing  - 1 x daily - 7 x weekly - 3 sets - 10 reps - 5" hold - Standing shoulder flexion wall slides  - 1 x daily - 7 x weekly - 3 sets - 10 reps - 5" hold  ASSESSMENT:  CLINICAL IMPRESSION: Session focus on lumbar mobility, proximal strengthening and postural strengthening.  MMT complete for gluteal mm with noted weakness with glut max.  Pt reports plans to drive to [place] this weekend, educated importance of seated posture for pain control.  Discussed lumbar support, adjusting mirrors in car to improve seated posture and additional abdominal strengthening to support back.  No reports of pain through session.   OBJECTIVE IMPAIRMENTS: decreased activity tolerance, decreased knowledge of condition, decreased mobility, difficulty walking, decreased ROM, decreased strength, hypomobility, increased fascial restrictions, impaired perceived functional ability, and pain.    ACTIVITY LIMITATIONS: carrying, lifting, bending, sitting, standing, squatting, and locomotion level  PARTICIPATION LIMITATIONS: meal prep, cleaning, laundry, shopping, community activity, yard work, and Freight forwarder POTENTIAL: Good  CLINICAL DECISION MAKING: Stable/uncomplicated  EVALUATION COMPLEXITY: Low   GOALS: Goals reviewed with patient? No  SHORT TERM GOALS: Target date: 03/01/2023  patient will be independent with initial HEP   Baseline: Goal status: IN PROGRESS  2.  Patient will self report 30% improvement to improve tolerance for functional activity  Baseline:  Goal status: IN PROGRESS  LONG TERM GOALS: Target date: 03/15/2023  Patient will be independent in self management strategies to improve quality of life and functional outcomes.  Baseline:  Goal status: IN PROGRESS  2.  Patient will self report 50% improvement to improve tolerance for functional activity  Baseline:  Goal status: IN PROGRESS  3.  Patient will be able to stand/walk x 15 minutes without back pain > 3/10 to play golf more efficiently Baseline:  Goal status: IN PROGRESS  4.  Patient will improve AROM of lumbar spine all ranges without pain to wfl throughout Baseline:  Goal status: IN PROGRESS  5.  Patient will improve Modified Oswestry score to 10/50 or less  Baseline: 15/50 Goal status: IN PROGRESS  PLAN:  PT FREQUENCY: 2x/week  PT DURATION: 4 weeks  PLANNED INTERVENTIONS: Therapeutic exercises, Therapeutic activity, Neuromuscular re-education, Balance training, Gait training, Patient/Family education, Joint manipulation, Joint mobilization, Stair training, Orthotic/Fit training, DME instructions, Aquatic Therapy, Dry Needling, Electrical stimulation, Spinal manipulation, Spinal mobilization, Cryotherapy, Moist heat, Compression bandaging, scar mobilization, Splintting, Taping, Traction, Ultrasound, Ionotophoresis 4mg /ml Dexamethasone, and Manual therapy .  PLAN FOR NEXT  SESSION: Progress lumbar mobility and postural strengthening  Becky Sax, LPTA/CLT; CBIS [phone]  Juel Burrow, PTA 03/03/2023, 9:09 AM  9:09 AM, 03/03/23

## 2023-03-05 ENCOUNTER — Encounter (HOSPITAL_COMMUNITY): Payer: Self-pay

## 2023-03-05 ENCOUNTER — Ambulatory Visit (HOSPITAL_COMMUNITY): Payer: Medicare Other

## 2023-03-05 DIAGNOSIS — Z7409 Other reduced mobility: Secondary | ICD-10-CM | POA: Diagnosis not present

## 2023-03-05 DIAGNOSIS — R262 Difficulty in walking, not elsewhere classified: Secondary | ICD-10-CM | POA: Diagnosis not present

## 2023-03-05 DIAGNOSIS — M545 Low back pain, unspecified: Secondary | ICD-10-CM

## 2023-03-05 NOTE — Therapy (Signed)
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 s/p CABG 1997.   Heart attack (HCC)    Hyperlipidemia    Hypertension    Obesity (BMI 30-39.9)    OSA on CPAP March 2016   Compliant   RBBB with left anterior fascicular block    Past Surgical History:  Procedure Laterality Date   COLONOSCOPY N/A 08/01/2013   Procedure: COLONOSCOPY;  Surgeon: Dalia Heading, MD;  Location: [facility];  Service: Gastroenterology;  Laterality: N/A;   CORONARY ARTERY BYPASS GRAFT     x4   Lens Bilateral April 2016   Replacment    LOWER LEG SOFT TISSUE TUMOR EXCISION     Patient Active Problem List   Diagnosis Date Noted   OSA on CPAP 04/09/2015   Liposarcoma (HCC) 07/06/2013   RBBB plus LA hemiblock 03/21/2013   Obesity (BMI 30-39.9) 07/22/2009   Dyslipidemia, goal LDL below 70 01/09/2009   Essential hypertension 01/09/2009   Hx of CABG 01/09/2009    PCP: Assunta Found, MD  REFERRING PROVIDER: Lisbeth Renshaw, MD  REFERRING DIAG: [phone] (ICD-10-CM) - Spinal stenosis, lumbar region with neurogenic claudication  Rationale for Evaluation and Treatment: Rehabilitation  THERAPY DIAG:  Low back pain, unspecified back pain laterality, unspecified chronicity, unspecified whether sciatica present  ONSET DATE: 2019  SUBJECTIVE:                                                                                                                                                                                            SUBJECTIVE STATEMENT: Pt reports he is sore today and reports of tightness in lower back.  No reports of pain.    Evaluation:Tripped back in 2019; caused him issues with his back and down his legs; ended up doing some therapy and having an epidural injection that helped him a lot;   Now pain has returned over the past 2 months;  notices especially with playing golf and with prolonged standing.  Saw Nundkumar, who ordered another epidural and physical therapy.    PERTINENT HISTORY:  Chronic leg and back pain; had epidural injection about a month ago.    PAIN:  Are you having pain? Yes: NPRS scale: at most 7/10; best 0 currently 3/10 Pain location: mid back mostly; occasionally in legs to knees Pain description: aching Aggravating factors: prolonged standing, walking, prolonged sitting Relieving factors: laying down, relaxing; doesn't bother him sleeping  PRECAUTIONS: None  WEIGHT BEARING RESTRICTIONS: No  FALLS:  Has patient fallen in last 6 months? Yes. Number of falls 1-2  OCCUPATION: retired  PLOF: Independent  PATIENT GOALS: like be able to play golf without pain  NEXT MD VISIT: 02/17/23  OBJECTIVE:   DIAGNOSTIC FINDINGS:  CLINICAL DATA:  Low back pain extending down legs, 6 weeks duration.   EXAM: MRI LUMBAR SPINE WITHOUT CONTRAST   TECHNIQUE: Multiplanar, multisequence MR imaging of the lumbar spine was performed. No intravenous contrast was administered.   COMPARISON:  None.   FINDINGS: Segmentation:  5 lumbar type vertebral bodies.   Alignment:  Normal except for 2 mm retrolisthesis L2-3 and L3-4.   Vertebrae:  No fracture or primary bone lesion.   Conus medullaris and cauda equina: Conus extends to the L1 level. Conus and cauda equina appear normal.   Paraspinal and other soft tissues: Negative   Disc levels:   Likely congenital diminutive discs at T11-12, T12-L1 and L1-2. No stenosis or neural  compression at those levels.   L2-3: 2 mm retrolisthesis. Mild bulging of the disc. Mild facet and ligamentous hypertrophy. Mild narrowing of both lateral recesses but without demonstrable neural compression.   L3-4: 2 mm retrolisthesis. Mild bulging of the disc. Facet and ligamentous hypertrophy. Stenosis of the canal and both lateral recesses that could cause neural compression on either or both sides. Some edema between the spinous processes likely indicating abutment, which could be associated with some back pain.   L4-5: Endplate osteophytes and mild bulging of the disc. Facet and ligamentous hypertrophy. Stenosis of both lateral recesses that could cause neural compression.   L5-S1: Minimal disc bulge. Facet osteoarthritis. No canal or foraminal stenosis.   IMPRESSION: L3-4: Multifactorial canal stenosis and bilateral lateral recess stenosis that could cause neural compression on either or both sides. Findings at this level could also be associated with back pain.   L4-5: Bilateral lateral recess stenosis that could cause neural compression on either or both sides. Findings could also be associated with back pain.   L5-S1: Facet osteoarthritis which could be associated with back pain. No stenosis or neural compression.   L2-3: Mild bilateral lateral recess stenosis without visible neural compression.  PATIENT SURVEYS:  Modified Oswestry 15/50; 30%   COGNITION: Overall cognitive status: Within functional limits for tasks assessed     SENSATION: WFL  POSTURE: rounded shoulders, forward head, and decreased lumbar lordosis  PALPATION: General tenderness lumbar spine  LUMBAR ROM:   AROM eval  Flexion 60% available  Extension 10% available  Right lateral flexion 6" above knee  Left lateral flexion 6" above knee  Right rotation   Left rotation    (Blank rows = not tested)  LOWER EXTREMITY MMT:    MMT Right eval Left eval Right 03/03/23 Left 03/03/23   Hip flexion 4+ 4+    Hip extension   Prone: 4/5 Prone: 4-/5  Hip abduction   4/5 4  Hip adduction  Hip internal rotation      Hip external rotation      Knee flexion   5/5 5/5  Knee extension 5 5    Ankle dorsiflexion 5 5    Ankle plantarflexion      Ankle inversion      Ankle eversion       (Blank rows = not tested)    FUNCTIONAL TESTS:  5 times sit to stand: 13.34 sec no UE assist  GAIT: Distance walked: 50 ft in clinic Assistive device utilized: None Level of assistance: Complete Independence Comments: decreased trunk rotation  TODAY'S TREATMENT:                                                                                                                              DATE:  03/05/23: Standing:  Wall arch 10x 5"  RTB shoulder extension 10x   RTB rows 10x  3D hip excursion (weight shift with UE overhead stretch, rotation, squat then lumbar extension)  March with ab set  Hamstring stretch on 12in step 2x 30"  Hip extension 10x 5" Supine:   Piriformis stretch 2x 30" 90/90 with towel  LTR 5x 10"  03/01/23: Standing:  hip excursions 10X each    RTB shoulder extension    Wall arch 5x  MMT (see above)  Supine: Bridge 10x  Hamstring stretch 1x with hands behind knees, 2x with rope for 30" holds  Piriformis stretch 90/90 2x 30" Quadruped: cat/camel 5x 10" Seated: abdominal sets 10x 5"  Lumbar support   Seated posture  Encouarged to adjust mirror prior leaving parking lot  Rows RTB  Shoulders up back and relax   02/24/23 Standing:  hip excursions 10X each  Seated:  piriformis stretch 2X30"  Hamstring stretch 2X30"  Sit to stands 10X no UE  Long seated hamstring stretch 30" each side Supine: lower trunk rotations 10X5"  Abdominal isometric 10X5"  Bridge 10X  SLR 10X each    02/15/23 physical therapy evaluation and HEP instruction    PATIENT EDUCATION:  Education details: Patient educated on exam findings, POC, scope of PT, HEP, and what to expect  next visit. Person educated: Patient Education method: Explanation, Demonstration, and Handouts Education comprehension: verbalized understanding, returned demonstration, verbal cues required, and tactile cues required  HOME EXERCISE PROGRAM: Access Code: [email or web address] URL: [email or web address]  Date: 02/15/2023 Prepared by: [facility] - Rehab Exercises - Supine Lower Trunk Rotation  - 2 x daily - 7 x weekly - 1 sets - 10 reps - Seated Hamstring Stretch  - 2 x daily - 7 x weekly - 1 sets - 10 reps - 10 sec hold - Seated Piriformis Stretch with Trunk Bend  - 2 x daily - 7 x weekly - 1 sets - 10 reps - 10 sec hold  Date: 02/24/2023 Prepared by: Emeline Gins Exercises - Seated Table Hamstring Stretch  - 2 x daily - 7 x weekly - 1 sets - 3 reps -  30 sec hold - Sit to Stand Without Arm Support  - 2 x daily - 7 x weekly - 1 sets - 10 reps - Supine Transversus Abdominis Bracing - Hands on Ground  - 2 x daily - 7 x weekly - 1 sets - 10 reps - 5 sec hold - Supine Bridge  - 2 x daily - 7 x weekly - 1 sets - 10 reps - Small Range Straight Leg Raise  - 2 x daily - 7 x weekly - 1 sets - 10 reps  03/03/23- Supine Figure 4 Piriformis Stretch:  - 1 x daily - 7 x weekly - 3 sets - 3 reps - 30" hold  - Seated Transversus Abdominis Bracing  - 1 x daily - 7 x weekly - 3 sets - 10 reps - 5" hold - Standing shoulder flexion wall slides  - 1 x daily - 7 x weekly - 3 sets - 10 reps - 5" hold  ASSESSMENT:  CLINICAL IMPRESSION: Session focus with lumbar mobility, proximal and postural strengthening.  Add lumbar stability exercises with min cueing for posture and abdominal support.  Continued with stretches for mobility.  Pt tolerated well to session with no reports of increased pain.  OBJECTIVE IMPAIRMENTS: decreased activity tolerance, decreased knowledge of condition, decreased mobility, difficulty walking, decreased ROM, decreased strength, hypomobility, increased fascial restrictions, impaired perceived  functional ability, and pain.   ACTIVITY LIMITATIONS: carrying, lifting, bending, sitting, standing, squatting, and locomotion level  PARTICIPATION LIMITATIONS: meal prep, cleaning, laundry, shopping, community activity, yard work, and Freight forwarder POTENTIAL: Good  CLINICAL DECISION MAKING: Stable/uncomplicated  EVALUATION COMPLEXITY: Low   GOALS: Goals reviewed with patient? No  SHORT TERM GOALS: Target date: 03/01/2023  patient will be independent with initial HEP   Baseline: Goal status: IN PROGRESS  2.  Patient will self report 30% improvement to improve tolerance for functional activity  Baseline:  Goal status: IN PROGRESS  LONG TERM GOALS: Target date: 03/15/2023  Patient will be independent in self management strategies to improve quality of life and functional outcomes.  Baseline:  Goal status: IN PROGRESS  2.  Patient will self report 50% improvement to improve tolerance for functional activity  Baseline:  Goal status: IN PROGRESS  3.  Patient will be able to stand/walk x 15 minutes without back pain > 3/10 to play golf more efficiently Baseline:  Goal status: IN PROGRESS  4.  Patient will improve AROM of lumbar spine all ranges without pain to wfl throughout Baseline:  Goal status: IN PROGRESS  5.  Patient will improve Modified Oswestry score to 10/50 or less  Baseline: 15/50 Goal status: IN PROGRESS  PLAN:  PT FREQUENCY: 2x/week  PT DURATION: 4 weeks  PLANNED INTERVENTIONS: Therapeutic exercises, Therapeutic activity, Neuromuscular re-education, Balance training, Gait training, Patient/Family education, Joint manipulation, Joint mobilization, Stair training, Orthotic/Fit training, DME instructions, Aquatic Therapy, Dry Needling, Electrical stimulation, Spinal manipulation, Spinal mobilization, Cryotherapy, Moist heat, Compression bandaging, scar mobilization, Splintting, Taping, Traction, Ultrasound, Ionotophoresis 4mg /ml Dexamethasone, and  Manual therapy .  PLAN FOR NEXT SESSION: Progress lumbar mobility and postural strengthening.  Add cervical retraction, standing abduction and progress balance.    Becky Sax, LPTA/CLT; CBIS [phone]  Juel Burrow, PTA 03/05/2023, 9:48 AM  9:48 AM, 03/05/23

## 2023-03-08 ENCOUNTER — Ambulatory Visit (HOSPITAL_COMMUNITY): Payer: Medicare Other | Admitting: Physical Therapy

## 2023-03-08 DIAGNOSIS — M545 Low back pain, unspecified: Secondary | ICD-10-CM | POA: Diagnosis not present

## 2023-03-08 DIAGNOSIS — R262 Difficulty in walking, not elsewhere classified: Secondary | ICD-10-CM | POA: Diagnosis not present

## 2023-03-08 DIAGNOSIS — Z7409 Other reduced mobility: Secondary | ICD-10-CM | POA: Diagnosis not present

## 2023-03-08 NOTE — Therapy (Signed)
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 s/p CABG 1997.   Heart attack (HCC)    Hyperlipidemia    Hypertension    Obesity (BMI 30-39.9)    OSA on CPAP March 2016   Compliant   RBBB with left anterior fascicular block    Past Surgical History:  Procedure Laterality Date   COLONOSCOPY N/A 08/01/2013   Procedure: COLONOSCOPY;  Surgeon: Dalia Heading, MD;  Location: [facility];  Service: Gastroenterology;  Laterality: N/A;   CORONARY ARTERY BYPASS GRAFT     x4   Lens Bilateral April 2016   Replacment    LOWER LEG SOFT TISSUE TUMOR EXCISION     Patient Active Problem List   Diagnosis Date Noted   OSA on CPAP 04/09/2015   Liposarcoma (HCC) 07/06/2013   RBBB plus LA hemiblock 03/21/2013   Obesity (BMI 30-39.9) 07/22/2009   Dyslipidemia, goal LDL below 70 01/09/2009   Essential hypertension 01/09/2009   Hx of CABG 01/09/2009    PCP: Assunta Found, MD  REFERRING PROVIDER: Lisbeth Renshaw, MD  REFERRING DIAG: [phone] (ICD-10-CM) - Spinal stenosis, lumbar region with neurogenic claudication  Rationale for Evaluation and Treatment: Rehabilitation  THERAPY DIAG:  Low back pain, unspecified back pain laterality, unspecified chronicity, unspecified whether sciatica present  ONSET DATE: 2019  SUBJECTIVE:                                                                                                                                                                                            SUBJECTIVE STATEMENT: Pt reports he drove to [place] and back on Saturday for a family reunion.  States he is sore from that but no specific pain currently.     Evaluation:Tripped back in 2019; caused him issues with his back and down his legs; ended up doing some therapy and having an epidural injection that helped him a lot;  Now pain has returned over the past 2 months; notices especially with playing golf and with prolonged standing.  Saw Nundkumar, who ordered another epidural and physical therapy.    PERTINENT HISTORY:  Chronic leg and back pain; had epidural injection about a month ago.    PAIN:  Are you having pain? Yes: NPRS scale: at most 7/10; best 0 currently 3/10 Pain location: mid back mostly; occasionally in legs to knees Pain description: aching Aggravating factors: prolonged standing, walking, prolonged sitting Relieving factors: laying down, relaxing; doesn't bother him sleeping  PRECAUTIONS: None  WEIGHT BEARING RESTRICTIONS: No  FALLS:  Has patient fallen in last 6 months? Yes. Number of falls 1-2  OCCUPATION: retired  PLOF: Independent  PATIENT GOALS: like be able to play golf without pain  NEXT MD VISIT: 02/17/23  OBJECTIVE:   DIAGNOSTIC FINDINGS:  CLINICAL DATA:  Low back pain extending down legs, 6 weeks duration.   EXAM: MRI LUMBAR SPINE WITHOUT CONTRAST   TECHNIQUE: Multiplanar, multisequence MR imaging of the lumbar spine was performed. No intravenous contrast was administered.   COMPARISON:  None.   FINDINGS: Segmentation:  5 lumbar type vertebral bodies.   Alignment:  Normal except for 2 mm retrolisthesis L2-3 and L3-4.   Vertebrae:  No fracture or primary bone lesion.   Conus medullaris and cauda equina: Conus extends to the L1 level. Conus and cauda equina appear normal.   Paraspinal and other soft tissues: Negative   Disc levels:   Likely congenital diminutive discs at  T11-12, T12-L1 and L1-2. No stenosis or neural compression at those levels.   L2-3: 2 mm retrolisthesis. Mild bulging of the disc. Mild facet and ligamentous hypertrophy. Mild narrowing of both lateral recesses but without demonstrable neural compression.   L3-4: 2 mm retrolisthesis. Mild bulging of the disc. Facet and ligamentous hypertrophy. Stenosis of the canal and both lateral recesses that could cause neural compression on either or both sides. Some edema between the spinous processes likely indicating abutment, which could be associated with some back pain.   L4-5: Endplate osteophytes and mild bulging of the disc. Facet and ligamentous hypertrophy. Stenosis of both lateral recesses that could cause neural compression.   L5-S1: Minimal disc bulge. Facet osteoarthritis. No canal or foraminal stenosis.   IMPRESSION: L3-4: Multifactorial canal stenosis and bilateral lateral recess stenosis that could cause neural compression on either or both sides. Findings at this level could also be associated with back pain.   L4-5: Bilateral lateral recess stenosis that could cause neural compression on either or both sides. Findings could also be associated with back pain.   L5-S1: Facet osteoarthritis which could be associated with back pain. No stenosis or neural compression.   L2-3: Mild bilateral lateral recess stenosis without visible neural compression.  PATIENT SURVEYS:  Modified Oswestry 15/50; 30%   COGNITION: Overall cognitive status: Within functional limits for tasks assessed     SENSATION: WFL  POSTURE: rounded shoulders, forward head, and decreased lumbar lordosis  PALPATION: General tenderness lumbar spine  LUMBAR ROM:   AROM eval  Flexion 60% available  Extension 10% available  Right lateral flexion 6" above knee  Left lateral flexion 6" above knee  Right rotation   Left rotation    (Blank rows = not tested)  LOWER EXTREMITY MMT:    MMT  Right eval Left eval Right 03/03/23 Left 03/03/23  Hip flexion 4+ 4+    Hip extension   Prone: 4/5 Prone: 4-/5  Hip abduction   4/5  4  Hip adduction      Hip internal rotation      Hip external rotation      Knee flexion   5/5 5/5  Knee extension 5 5    Ankle dorsiflexion 5 5    Ankle plantarflexion      Ankle inversion      Ankle eversion       (Blank rows = not tested)    FUNCTIONAL TESTS:  5 times sit to stand: 13.34 sec no UE assist  GAIT: Distance walked: 50 ft in clinic Assistive device utilized: None Level of assistance: Complete Independence Comments: decreased trunk rotation  TODAY'S TREATMENT:                                                                                                                              DATE:  03/08/23 Standing: heelraise (warm up) 20X  Hip extension 10X  Hip abduction 10X  Hamstring stretch with 12" step  3D hip excursions 10X each (lat, rot, A/P)  Green TB shoulder extension 10X2   Rows 10X2  Tandem stance 30" each LE no UE  Wall arch 10X5" Seated: cervical retractions 10X5" Supine:  lower trunk rotation 10X5"  03/05/23: Standing:  Wall arch 10x 5"  RTB shoulder extension 10x   RTB rows 10x  3D hip excursion (weight shift with UE overhead stretch, rotation, squat then lumbar extension)  March with ab set  Hamstring stretch on 12in step 2x 30"  Hip extension 10x 5" Supine:   Piriformis stretch 2x 30" 90/90 with towel  LTR 5x 10"  03/01/23: Standing:  hip excursions 10X each    RTB shoulder extension    Wall arch 5x  MMT (see above)  Supine: Bridge 10x  Hamstring stretch 1x with hands behind knees, 2x with rope for 30" holds  Piriformis stretch 90/90 2x 30" Quadruped: cat/camel 5x 10" Seated: abdominal sets 10x 5"  Lumbar support   Seated posture  Encouarged to adjust mirror prior leaving parking lot  Rows RTB  Shoulders up back and relax   02/24/23 Standing:  hip excursions 10X each  Seated:  piriformis  stretch 2X30"  Hamstring stretch 2X30"  Sit to stands 10X no UE  Long seated hamstring stretch 30" each side Supine: lower trunk rotations 10X5"  Abdominal isometric 10X5"  Bridge 10X  SLR 10X each    02/15/23 physical therapy evaluation and HEP instruction    PATIENT EDUCATION:  Education details: Patient educated on exam findings, POC, scope of PT, HEP, and what to expect next visit. Person educated: Patient Education method: Explanation, Demonstration, and Handouts Education comprehension: verbalized understanding, returned demonstration, verbal cues required, and tactile cues required  HOME EXERCISE PROGRAM: Access Code: [email or web address] URL: [email or web address]  Date: 02/15/2023 Prepared by: [facility] Exercises - Supine Lower Trunk Rotation  - 2 x daily - 7 x weekly - 1 sets - 10 reps - Seated Hamstring Stretch  - 2  x daily - 7 x weekly - 1 sets - 10 reps - 10 sec hold - Seated Piriformis Stretch with Trunk Bend  - 2 x daily - 7 x weekly - 1 sets - 10 reps - 10 sec hold  Date: 02/24/2023 Prepared by: Emeline Gins Exercises - Seated Table Hamstring Stretch  - 2 x daily - 7 x weekly - 1 sets - 3 reps - 30 sec hold - Sit to Stand Without Arm Support  - 2 x daily - 7 x weekly - 1 sets - 10 reps - Supine Transversus Abdominis Bracing - Hands on Ground  - 2 x daily - 7 x weekly - 1 sets - 10 reps - 5 sec hold - Supine Bridge  - 2 x daily - 7 x weekly - 1 sets - 10 reps - Small Range Straight Leg Raise  - 2 x daily - 7 x weekly - 1 sets - 10 reps  03/03/23- Supine Figure 4 Piriformis Stretch:  - 1 x daily - 7 x weekly - 3 sets - 3 reps - 30" hold  - Seated Transversus Abdominis Bracing  - 1 x daily - 7 x weekly - 3 sets - 10 reps - 5" hold - Standing shoulder flexion wall slides  - 1 x daily - 7 x weekly - 3 sets - 10 reps - 5" hold  ASSESSMENT:  CLINICAL IMPRESSION: Continued focus with lumbar mobility, proximal and postural strengthening.  Increased challenge of  postural strengthening to green band today with additional set added. Added hip abduction and began tandem stance.  Pt able to complete full 30" holds with both LE leading with tandem, however noted weakness/challenge of mm.  Cervical retraction added with good form and little cues needed.  Ended session with stretches for mobility.  Pt tolerated well to session with no reports of increased pain.  OBJECTIVE IMPAIRMENTS: decreased activity tolerance, decreased knowledge of condition, decreased mobility, difficulty walking, decreased ROM, decreased strength, hypomobility, increased fascial restrictions, impaired perceived functional ability, and pain.   ACTIVITY LIMITATIONS: carrying, lifting, bending, sitting, standing, squatting, and locomotion level  PARTICIPATION LIMITATIONS: meal prep, cleaning, laundry, shopping, community activity, yard work, and Freight forwarder POTENTIAL: Good  CLINICAL DECISION MAKING: Stable/uncomplicated  EVALUATION COMPLEXITY: Low   GOALS: Goals reviewed with patient? No  SHORT TERM GOALS: Target date: 03/01/2023  patient will be independent with initial HEP   Baseline: Goal status: IN PROGRESS  2.  Patient will self report 30% improvement to improve tolerance for functional activity  Baseline:  Goal status: IN PROGRESS  LONG TERM GOALS: Target date: 03/15/2023  Patient will be independent in self management strategies to improve quality of life and functional outcomes.  Baseline:  Goal status: IN PROGRESS  2.  Patient will self report 50% improvement to improve tolerance for functional activity  Baseline:  Goal status: IN PROGRESS  3.  Patient will be able to stand/walk x 15 minutes without back pain > 3/10 to play golf more efficiently Baseline:  Goal status: IN PROGRESS  4.  Patient will improve AROM of lumbar spine all ranges without pain to wfl throughout Baseline:  Goal status: IN PROGRESS  5.  Patient will improve Modified Oswestry  score to 10/50 or less  Baseline: 15/50 Goal status: IN PROGRESS  PLAN:  PT FREQUENCY: 2x/week  PT DURATION: 4 weeks  PLANNED INTERVENTIONS: Therapeutic exercises, Therapeutic activity, Neuromuscular re-education, Balance training, Gait training, Patient/Family education, Joint manipulation, Joint mobilization, Stair training, Orthotic/Fit  training, DME instructions, Aquatic Therapy, Dry Needling, Electrical stimulation, Spinal manipulation, Spinal mobilization, Cryotherapy, Moist heat, Compression bandaging, scar mobilization, Splintting, Taping, Traction, Ultrasound, Ionotophoresis 4mg /ml Dexamethasone, and Manual therapy .  PLAN FOR NEXT SESSION: Progress lumbar mobility and postural strengthening.  Progress balance.  Add foam to increase challenge of tandem next session.   Lurena Nida, PTA 03/08/2023, 11:26 AM  11:26 AM, 03/08/23

## 2023-03-10 ENCOUNTER — Ambulatory Visit (HOSPITAL_COMMUNITY): Payer: Medicare Other | Admitting: Physical Therapy

## 2023-03-10 DIAGNOSIS — Z7409 Other reduced mobility: Secondary | ICD-10-CM | POA: Diagnosis not present

## 2023-03-10 DIAGNOSIS — M545 Low back pain, unspecified: Secondary | ICD-10-CM | POA: Diagnosis not present

## 2023-03-10 DIAGNOSIS — R262 Difficulty in walking, not elsewhere classified: Secondary | ICD-10-CM | POA: Diagnosis not present

## 2023-03-10 NOTE — Therapy (Signed)
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 s/p CABG 1997.   Heart attack (HCC)    Hyperlipidemia    Hypertension    Obesity (BMI 30-39.9)    OSA on CPAP March 2016   Compliant   RBBB with left anterior fascicular block    Past Surgical History:  Procedure Laterality Date   COLONOSCOPY N/A 08/01/2013   Procedure: COLONOSCOPY;  Surgeon: Dalia Heading, MD;  Location: [facility];  Service: Gastroenterology;  Laterality: N/A;   CORONARY ARTERY BYPASS GRAFT     x4   Lens Bilateral April 2016   Replacment    LOWER LEG SOFT TISSUE TUMOR EXCISION     Patient Active Problem List   Diagnosis Date Noted   OSA on CPAP 04/09/2015   Liposarcoma (HCC) 07/06/2013   RBBB plus LA hemiblock 03/21/2013   Obesity (BMI 30-39.9) 07/22/2009   Dyslipidemia, goal LDL below 70 01/09/2009   Essential hypertension 01/09/2009   Hx of CABG 01/09/2009    PCP: Assunta Found, MD  REFERRING PROVIDER: Lisbeth Renshaw, MD  REFERRING DIAG: [phone] (ICD-10-CM) - Spinal stenosis, lumbar region with neurogenic claudication  Rationale for Evaluation and Treatment: Rehabilitation  THERAPY DIAG:  No diagnosis found.  ONSET DATE: 2019  SUBJECTIVE:                                                                                                                                                                                           SUBJECTIVE STATEMENT: Pt  reports he only has minimal pain of 1/10 in lower back.     Evaluation:Tripped back in 2019; caused him issues with his back and down his legs; ended up doing some therapy and having an epidural injection that helped him a lot;   Now pain has returned over the past 2 months; notices especially with playing golf and with prolonged standing.  Saw Nundkumar, who ordered  another epidural and physical therapy.    PERTINENT HISTORY:  Chronic leg and back pain; had epidural injection about a month ago.    PAIN:  Are you having pain? Yes: NPRS scale: at most 4/10; best 0 currently 1/10 Pain location: mid back mostly; occasionally in legs to knees Pain description: aching Aggravating factors: prolonged standing, walking, prolonged sitting Relieving factors: laying down, relaxing; doesn't bother him sleeping  PRECAUTIONS: None  WEIGHT BEARING RESTRICTIONS: No  FALLS:  Has patient fallen in last 6 months? Yes. Number of falls 1-2  OCCUPATION: retired  PLOF: Independent  PATIENT GOALS: like be able to play golf without pain  NEXT MD VISIT: 02/17/23  OBJECTIVE:   DIAGNOSTIC FINDINGS:  CLINICAL DATA:  Low back pain extending down legs, 6 weeks duration.   EXAM: MRI LUMBAR SPINE WITHOUT CONTRAST   TECHNIQUE: Multiplanar, multisequence MR imaging of the lumbar spine was performed. No intravenous contrast was administered.   COMPARISON:  None.   FINDINGS: Segmentation:  5 lumbar type vertebral bodies.   Alignment:  Normal except for 2 mm retrolisthesis L2-3 and L3-4.   Vertebrae:  No fracture or primary bone lesion.   Conus medullaris and cauda equina: Conus extends to the L1 level. Conus and cauda equina appear normal.   Paraspinal and other soft tissues: Negative   Disc levels:   Likely congenital diminutive discs at T11-12, T12-L1 and L1-2. No stenosis or neural compression at those levels.   L2-3: 2 mm retrolisthesis. Mild bulging of the disc. Mild facet and ligamentous  hypertrophy. Mild narrowing of both lateral recesses but without demonstrable neural compression.   L3-4: 2 mm retrolisthesis. Mild bulging of the disc. Facet and ligamentous hypertrophy. Stenosis of the canal and both lateral recesses that could cause neural compression on either or both sides. Some edema between the spinous processes likely indicating abutment, which could be associated with some back pain.   L4-5: Endplate osteophytes and mild bulging of the disc. Facet and ligamentous hypertrophy. Stenosis of both lateral recesses that could cause neural compression.   L5-S1: Minimal disc bulge. Facet osteoarthritis. No canal or foraminal stenosis.   IMPRESSION: L3-4: Multifactorial canal stenosis and bilateral lateral recess stenosis that could cause neural compression on either or both sides. Findings at this level could also be associated with back pain.   L4-5: Bilateral lateral recess stenosis that could cause neural compression on either or both sides. Findings could also be associated with back pain.   L5-S1: Facet osteoarthritis which could be associated with back pain. No stenosis or neural compression.   L2-3: Mild bilateral lateral recess stenosis without visible neural compression.  PATIENT SURVEYS:  Modified Oswestry 15/50; 30%   COGNITION: Overall cognitive status: Within functional limits for tasks assessed     SENSATION: WFL  POSTURE: rounded shoulders, forward head, and decreased lumbar lordosis  PALPATION: General tenderness lumbar spine  LUMBAR ROM:   AROM eval  Flexion 60% available  Extension 10% available  Right lateral flexion 6" above knee  Left lateral flexion 6" above knee  Right rotation   Left rotation    (Blank rows = not tested)  LOWER EXTREMITY MMT:    MMT Right eval Left eval Right 03/03/23 Left 03/03/23  Hip flexion 4+ 4+    Hip extension   Prone: 4/5 Prone: 4-/5  Hip abduction   4/5 4  Hip adduction      Hip  internal rotation      Hip external rotation  Knee flexion   5/5 5/5  Knee extension 5 5    Ankle dorsiflexion 5 5    Ankle plantarflexion      Ankle inversion      Ankle eversion       (Blank rows = not tested)    FUNCTIONAL TESTS:  5 times sit to stand: 13.34 sec no UE assist  GAIT: Distance walked: 50 ft in clinic Assistive device utilized: None Level of assistance: Complete Independence Comments: decreased trunk rotation  TODAY'S TREATMENT:                                                                                                                              DATE:  03/10/23 Standing: heelraise (warm up) 20X  Hip extension 10X2  Hip abduction 10X2  Hamstring stretch with 12" step 3X30" each LE  3D hip excursions 10X each (lat, rot, A/P)  Green TB shoulder extension 10X2, rows 10X2  Paloff GTB 2X10 each direction normal BOS  Tandem stance 30" X3 on balance beam foam each LE no UE  Wall arch 10X5" Nustep 5 minutes UE/LE level 5   03/08/23 Standing: heelraise (warm up) 20X  Hip extension 10X  Hip abduction 10X  Hamstring stretch with 12" step  3D hip excursions 10X each (lat, rot, A/P)  Green TB shoulder extension 10X2   Rows 10X2  Tandem stance 30" each LE no UE  Wall arch 10X5" Seated: cervical retractions 10X5" Supine:  lower trunk rotation 10X5"  03/05/23: Standing:  Wall arch 10x 5"  RTB shoulder extension 10x   RTB rows 10x  3D hip excursion (weight shift with UE overhead stretch, rotation, squat then lumbar extension)  March with ab set  Hamstring stretch on 12in step 2x 30"  Hip extension 10x 5" Supine:   Piriformis stretch 2x 30" 90/90 with towel  LTR 5x 10"  03/01/23: Standing:  hip excursions 10X each    RTB shoulder extension    Wall arch 5x  MMT (see above)  Supine: Bridge 10x  Hamstring stretch 1x with hands behind knees, 2x with rope for 30" holds  Piriformis stretch 90/90 2x 30" Quadruped: cat/camel 5x 10" Seated: abdominal  sets 10x 5"  Lumbar support   Seated posture  Encouarged to adjust mirror prior leaving parking lot  Rows RTB  Shoulders up back and relax   02/24/23 Standing:  hip excursions 10X each  Seated:  piriformis stretch 2X30"  Hamstring stretch 2X30"  Sit to stands 10X no UE  Long seated hamstring stretch 30" each side Supine: lower trunk rotations 10X5"  Abdominal isometric 10X5"  Bridge 10X  SLR 10X each    02/15/23 physical therapy evaluation and HEP instruction    PATIENT EDUCATION:  Education details: Patient educated on exam findings, POC, scope of PT, HEP, and what to expect next visit. Person educated: Patient Education method: Explanation, Demonstration, and Handouts Education comprehension: verbalized understanding, returned demonstration, verbal cues required, and  tactile cues required  HOME EXERCISE PROGRAM: Access Code: [email or web address] URL: [email or web address]  Date: 02/15/2023 Prepared by: [facility] Exercises - Supine Lower Trunk Rotation  - 2 x daily - 7 x weekly - 1 sets - 10 reps - Seated Hamstring Stretch  - 2 x daily - 7 x weekly - 1 sets - 10 reps - 10 sec hold - Seated Piriformis Stretch with Trunk Bend  - 2 x daily - 7 x weekly - 1 sets - 10 reps - 10 sec hold  Date: 02/24/2023 Prepared by: Emeline Gins Exercises - Seated Table Hamstring Stretch  - 2 x daily - 7 x weekly - 1 sets - 3 reps - 30 sec hold - Sit to Stand Without Arm Support  - 2 x daily - 7 x weekly - 1 sets - 10 reps - Supine Transversus Abdominis Bracing - Hands on Ground  - 2 x daily - 7 x weekly - 1 sets - 10 reps - 5 sec hold - Supine Bridge  - 2 x daily - 7 x weekly - 1 sets - 10 reps - Small Range Straight Leg Raise  - 2 x daily - 7 x weekly - 1 sets - 10 reps  03/03/23- Supine Figure 4 Piriformis Stretch:  - 1 x daily - 7 x weekly - 3 sets - 3 reps - 30" hold  - Seated Transversus Abdominis Bracing  - 1 x daily - 7 x weekly - 3 sets - 10 reps - 5" hold - Standing shoulder  flexion wall slides  - 1 x daily - 7 x weekly - 3 sets - 10 reps - 5" hold  ASSESSMENT:  CLINICAL IMPRESSION: Highest pain rating now only 4/10 (was 7/10) and reports this only occurs if he's been standing too long.  Continued with focus on improving LE strength/stability.  Continued focus with lumbar mobility, proximal and postural strengthening.  Additional set of LE strengthening added today.  Used foam beam to increase challenge of tandem stance with ability to complete full 30" holds with both LE leading.  Paloff incorporated to challenge core stability.  Pt did  report slight increase pain following this activity but resolved with hip excursions.   Ended session with nustep to increase activity tolerance/mobility.  No reports of increased pain at end of session.  OBJECTIVE IMPAIRMENTS: decreased activity tolerance, decreased knowledge of condition, decreased mobility, difficulty walking, decreased ROM, decreased strength, hypomobility, increased fascial restrictions, impaired perceived functional ability, and pain.   ACTIVITY LIMITATIONS: carrying, lifting, bending, sitting, standing, squatting, and locomotion level  PARTICIPATION LIMITATIONS: meal prep, cleaning, laundry, shopping, community activity, yard work, and Freight forwarder POTENTIAL: Good  CLINICAL DECISION MAKING: Stable/uncomplicated  EVALUATION COMPLEXITY: Low   GOALS: Goals reviewed with patient? No  SHORT TERM GOALS: Target date: 03/01/2023  patient will be independent with initial HEP   Baseline: Goal status: IN PROGRESS  2.  Patient will self report 30% improvement to improve tolerance for functional activity  Baseline:  Goal status: IN PROGRESS  LONG TERM GOALS: Target date: 03/15/2023  Patient will be independent in self management strategies to improve quality of life and functional outcomes.  Baseline:  Goal status: IN PROGRESS  2.  Patient will self report 50% improvement to improve tolerance for  functional activity  Baseline:  Goal status: IN PROGRESS  3.  Patient will be able to stand/walk x 15 minutes without back pain > 3/10 to play golf more efficiently Baseline:  Goal status:  IN PROGRESS  4.  Patient will improve AROM of lumbar spine all ranges without pain to wfl throughout Baseline:  Goal status: IN PROGRESS  5.  Patient will improve Modified Oswestry score to 10/50 or less  Baseline: 15/50 Goal status: IN PROGRESS  PLAN:  PT FREQUENCY: 2x/week  PT DURATION: 4 weeks  PLANNED INTERVENTIONS: Therapeutic exercises, Therapeutic activity, Neuromuscular re-education, Balance training, Gait training, Patient/Family education, Joint manipulation, Joint mobilization, Stair training, Orthotic/Fit training, DME instructions, Aquatic Therapy, Dry Needling, Electrical stimulation, Spinal manipulation, Spinal mobilization, Cryotherapy, Moist heat, Compression bandaging, scar mobilization, Splintting, Taping, Traction, Ultrasound, Ionotophoresis 4mg /ml Dexamethasone, and Manual therapy .  PLAN FOR NEXT SESSION: Progress lumbar mobility and postural strengthening.  Progress balance. Re-assess next session.   Lurena Nida, PTA 03/10/2023, 11:48 AM  11:48 AM, 03/10/23

## 2023-03-15 ENCOUNTER — Ambulatory Visit (HOSPITAL_COMMUNITY)
Admission: RE | Admit: 2023-03-15 | Discharge: 2023-03-15 | Disposition: A | Discharge status: Home / Self Care | Payer: Medicare Other | Source: Ambulatory Visit | Attending: Family Medicine | Admitting: Family Medicine

## 2023-03-15 ENCOUNTER — Other Ambulatory Visit (HOSPITAL_COMMUNITY): Payer: Self-pay | Admitting: Family Medicine

## 2023-03-15 ENCOUNTER — Encounter (HOSPITAL_COMMUNITY): Payer: Medicare Other | Admitting: Physical Therapy

## 2023-03-15 DIAGNOSIS — E6609 Other obesity due to excess calories: Secondary | ICD-10-CM | POA: Diagnosis not present

## 2023-03-15 DIAGNOSIS — J984 Other disorders of lung: Secondary | ICD-10-CM | POA: Diagnosis not present

## 2023-03-15 DIAGNOSIS — R053 Chronic cough: Secondary | ICD-10-CM

## 2023-03-15 DIAGNOSIS — R822 Biliuria: Secondary | ICD-10-CM | POA: Diagnosis not present

## 2023-03-15 DIAGNOSIS — Z6834 Body mass index (BMI) 34.0-34.9, adult: Secondary | ICD-10-CM | POA: Diagnosis not present

## 2023-03-15 DIAGNOSIS — R310 Gross hematuria: Secondary | ICD-10-CM | POA: Diagnosis not present

## 2023-03-15 DIAGNOSIS — I10 Essential (primary) hypertension: Secondary | ICD-10-CM | POA: Diagnosis not present

## 2023-03-15 DIAGNOSIS — R945 Abnormal results of liver function studies: Secondary | ICD-10-CM | POA: Diagnosis not present

## 2023-03-15 DIAGNOSIS — R918 Other nonspecific abnormal finding of lung field: Secondary | ICD-10-CM | POA: Diagnosis not present

## 2023-03-15 DIAGNOSIS — B188 Other chronic viral hepatitis: Secondary | ICD-10-CM | POA: Diagnosis not present

## 2023-03-16 ENCOUNTER — Encounter (HOSPITAL_COMMUNITY): Payer: Self-pay | Admitting: Pharmacy Technician

## 2023-03-16 ENCOUNTER — Observation Stay (HOSPITAL_COMMUNITY)
Admission: EM | Admit: 2023-03-16 | Discharge: 2023-03-18 | Disposition: A | Discharge status: Home / Self Care | Payer: Medicare Other | Attending: Internal Medicine | Admitting: Internal Medicine

## 2023-03-16 ENCOUNTER — Emergency Department (HOSPITAL_COMMUNITY): Payer: Medicare Other

## 2023-03-16 ENCOUNTER — Other Ambulatory Visit: Payer: Self-pay

## 2023-03-16 ENCOUNTER — Encounter (HOSPITAL_COMMUNITY): Payer: Self-pay

## 2023-03-16 ENCOUNTER — Ambulatory Visit (HOSPITAL_COMMUNITY): Admission: RE | Admit: 2023-03-16 | Payer: Medicare Other | Source: Ambulatory Visit

## 2023-03-16 DIAGNOSIS — J439 Emphysema, unspecified: Secondary | ICD-10-CM | POA: Diagnosis not present

## 2023-03-16 DIAGNOSIS — D649 Anemia, unspecified: Secondary | ICD-10-CM

## 2023-03-16 DIAGNOSIS — N179 Acute kidney failure, unspecified: Principal | ICD-10-CM

## 2023-03-16 DIAGNOSIS — Z7982 Long term (current) use of aspirin: Secondary | ICD-10-CM | POA: Insufficient documentation

## 2023-03-16 DIAGNOSIS — R7989 Other specified abnormal findings of blood chemistry: Secondary | ICD-10-CM | POA: Diagnosis present

## 2023-03-16 DIAGNOSIS — K8020 Calculus of gallbladder without cholecystitis without obstruction: Secondary | ICD-10-CM | POA: Diagnosis not present

## 2023-03-16 DIAGNOSIS — I2510 Atherosclerotic heart disease of native coronary artery without angina pectoris: Secondary | ICD-10-CM | POA: Insufficient documentation

## 2023-03-16 DIAGNOSIS — I700 Atherosclerosis of aorta: Secondary | ICD-10-CM | POA: Diagnosis not present

## 2023-03-16 DIAGNOSIS — Z79899 Other long term (current) drug therapy: Secondary | ICD-10-CM | POA: Insufficient documentation

## 2023-03-16 DIAGNOSIS — D72829 Elevated white blood cell count, unspecified: Secondary | ICD-10-CM | POA: Diagnosis not present

## 2023-03-16 DIAGNOSIS — N289 Disorder of kidney and ureter, unspecified: Secondary | ICD-10-CM

## 2023-03-16 DIAGNOSIS — R0602 Shortness of breath: Secondary | ICD-10-CM | POA: Insufficient documentation

## 2023-03-16 DIAGNOSIS — R918 Other nonspecific abnormal finding of lung field: Secondary | ICD-10-CM | POA: Diagnosis not present

## 2023-03-16 DIAGNOSIS — Z85831 Personal history of malignant neoplasm of soft tissue: Secondary | ICD-10-CM | POA: Insufficient documentation

## 2023-03-16 DIAGNOSIS — N2889 Other specified disorders of kidney and ureter: Secondary | ICD-10-CM

## 2023-03-16 DIAGNOSIS — R911 Solitary pulmonary nodule: Secondary | ICD-10-CM | POA: Diagnosis not present

## 2023-03-16 DIAGNOSIS — R310 Gross hematuria: Secondary | ICD-10-CM

## 2023-03-16 DIAGNOSIS — K429 Umbilical hernia without obstruction or gangrene: Secondary | ICD-10-CM | POA: Diagnosis not present

## 2023-03-16 DIAGNOSIS — I10 Essential (primary) hypertension: Secondary | ICD-10-CM | POA: Diagnosis not present

## 2023-03-16 DIAGNOSIS — R932 Abnormal findings on diagnostic imaging of liver and biliary tract: Secondary | ICD-10-CM | POA: Diagnosis not present

## 2023-03-16 DIAGNOSIS — G4733 Obstructive sleep apnea (adult) (pediatric): Secondary | ICD-10-CM

## 2023-03-16 DIAGNOSIS — Z87891 Personal history of nicotine dependence: Secondary | ICD-10-CM | POA: Diagnosis not present

## 2023-03-16 DIAGNOSIS — Z951 Presence of aortocoronary bypass graft: Secondary | ICD-10-CM

## 2023-03-16 DIAGNOSIS — K7689 Other specified diseases of liver: Secondary | ICD-10-CM | POA: Diagnosis not present

## 2023-03-16 DIAGNOSIS — E785 Hyperlipidemia, unspecified: Secondary | ICD-10-CM | POA: Diagnosis present

## 2023-03-16 LAB — TECHNOLOGIST SMEAR REVIEW: Plt Morphology: NORMAL

## 2023-03-16 LAB — URINALYSIS, ROUTINE W REFLEX MICROSCOPIC
Bacteria, UA: NONE SEEN
Bilirubin Urine: NEGATIVE
Glucose, UA: NEGATIVE mg/dL
Ketones, ur: NEGATIVE mg/dL
Nitrite: NEGATIVE
Protein, ur: 100 mg/dL — AB
RBC / HPF: >50 RBC/hpf (ref 0–5)
Specific Gravity, Urine: 1.012 (ref 1.005–1.030)
pH: 6 (ref 5.0–8.0)

## 2023-03-16 LAB — FERRITIN: Ferritin: 436 ng/mL — ABNORMAL HIGH (ref 24–336)

## 2023-03-16 LAB — DIFFERENTIAL
Basophils Absolute: 0.1 10*3/uL (ref 0.0–0.1)
Basophils Relative: 0 %
Eosinophils Absolute: 0.9 10*3/uL — ABNORMAL HIGH (ref 0.0–0.5)
Eosinophils Relative: 3 %
Lymphocytes Relative: 9 %
Lymphs Abs: 2.4 10*3/uL (ref 0.7–4.0)
Monocytes Absolute: 1.2 10*3/uL — ABNORMAL HIGH (ref 0.1–1.0)
Monocytes Relative: 5 %
Neutro Abs: 21.2 10*3/uL — ABNORMAL HIGH (ref 1.7–7.7)
Neutrophils Relative %: 81 %
Smear Review: NORMAL
nRBC: 0 /100 WBC

## 2023-03-16 LAB — IRON AND TIBC
Iron: 26 ug/dL — ABNORMAL LOW (ref 45–182)
Saturation Ratios: 11 % — ABNORMAL LOW (ref 17.9–39.5)
TIBC: 234 ug/dL — ABNORMAL LOW (ref 250–450)
UIBC: 208 ug/dL

## 2023-03-16 LAB — COMPREHENSIVE METABOLIC PANEL
ALT: 13 U/L (ref 0–44)
AST: 15 U/L (ref 15–41)
Albumin: 3.1 g/dL — ABNORMAL LOW (ref 3.5–5.0)
Alkaline Phosphatase: 74 U/L (ref 38–126)
Anion gap: 8 (ref 5–15)
BUN: 35 mg/dL — ABNORMAL HIGH (ref 8–23)
CO2: 21 mmol/L — ABNORMAL LOW (ref 22–32)
Calcium: 9.8 mg/dL (ref 8.9–10.3)
Chloride: 102 mmol/L (ref 98–111)
Creatinine, Ser: 1.81 mg/dL — ABNORMAL HIGH (ref 0.61–1.24)
GFR, Estimated: 39 mL/min — ABNORMAL LOW (ref >60)
Glucose, Bld: 115 mg/dL — ABNORMAL HIGH (ref 70–99)
Potassium: 4.9 mmol/L (ref 3.5–5.1)
Sodium: 131 mmol/L — ABNORMAL LOW (ref 135–145)
Total Bilirubin: 0.9 mg/dL (ref 0.3–1.2)
Total Protein: 6.8 g/dL (ref 6.5–8.1)

## 2023-03-16 LAB — CULTURE, BLOOD (ROUTINE X 2)
Special Requests: ADEQUATE
Special Requests: ADEQUATE

## 2023-03-16 LAB — CBC WITH DIFFERENTIAL/PLATELET
Abs Immature Granulocytes: 0.41 10*3/uL — ABNORMAL HIGH (ref 0.00–0.07)
Basophils Absolute: 0.1 10*3/uL (ref 0.0–0.1)
Basophils Relative: 1 %
Eosinophils Absolute: 0.7 10*3/uL — ABNORMAL HIGH (ref 0.0–0.5)
Eosinophils Relative: 3 %
HCT: 33.8 % — ABNORMAL LOW (ref 39.0–52.0)
Hemoglobin: 10.8 g/dL — ABNORMAL LOW (ref 13.0–17.0)
Immature Granulocytes: 2 %
Lymphocytes Relative: 8 %
Lymphs Abs: 1.9 10*3/uL (ref 0.7–4.0)
MCH: 27.8 pg (ref 26.0–34.0)
MCHC: 32 g/dL (ref 30.0–36.0)
MCV: 86.9 fL (ref 80.0–100.0)
Monocytes Absolute: 1.3 10*3/uL — ABNORMAL HIGH (ref 0.1–1.0)
Monocytes Relative: 5 %
Neutro Abs: 20 10*3/uL — ABNORMAL HIGH (ref 1.7–7.7)
Neutrophils Relative %: 81 %
Platelets: 264 10*3/uL (ref 150–400)
RBC: 3.89 MIL/uL — ABNORMAL LOW (ref 4.22–5.81)
RDW: 15.1 % (ref 11.5–15.5)
WBC: 24.3 10*3/uL — ABNORMAL HIGH (ref 4.0–10.5)
nRBC: 0 % (ref 0.0–0.2)

## 2023-03-16 LAB — CBC
HCT: 33.9 % — ABNORMAL LOW (ref 39.0–52.0)
Hemoglobin: 10.7 g/dL — ABNORMAL LOW (ref 13.0–17.0)
MCH: 28.3 pg (ref 26.0–34.0)
MCHC: 31.6 g/dL (ref 30.0–36.0)
MCV: 89.7 fL (ref 80.0–100.0)
Platelets: 277 10*3/uL (ref 150–400)
RBC: 3.78 MIL/uL — ABNORMAL LOW (ref 4.22–5.81)
RDW: 14.9 % (ref 11.5–15.5)
WBC: 25.9 10*3/uL — ABNORMAL HIGH (ref 4.0–10.5)
nRBC: 0 % (ref 0.0–0.2)

## 2023-03-16 LAB — CREATININE, URINE, RANDOM: Creatinine, Urine: 127 mg/dL

## 2023-03-16 LAB — SODIUM, URINE, RANDOM: Sodium, Ur: 59 mmol/L

## 2023-03-16 LAB — BRAIN NATRIURETIC PEPTIDE: B Natriuretic Peptide: 60 pg/mL (ref 0.0–100.0)

## 2023-03-16 MED ORDER — SODIUM CHLORIDE 0.9 % IV SOLN: INTRAVENOUS | Status: DC

## 2023-03-16 MED ORDER — AZITHROMYCIN 250 MG PO TABS
500.0000 mg | ORAL_TABLET | Freq: Once | ORAL | Status: AC
  Administered 2023-03-16: 500 mg via ORAL
  Filled 2023-03-16: qty 2

## 2023-03-16 MED ORDER — SODIUM CHLORIDE 0.9 % IV SOLN
1.0000 g | Freq: Once | INTRAVENOUS | Status: AC
  Administered 2023-03-16: 1 g via INTRAVENOUS
  Filled 2023-03-16 (×2): qty 10

## 2023-03-16 MED ORDER — SODIUM CHLORIDE 0.9 % IV BOLUS
1000.0000 mL | Freq: Once | INTRAVENOUS | Status: AC
  Administered 2023-03-16: 1000 mL via INTRAVENOUS

## 2023-03-16 MED ORDER — ACETAMINOPHEN 650 MG RE SUPP: 650.0000 mg | Freq: Four times a day (QID) | RECTAL | Status: DC | PRN

## 2023-03-16 MED ORDER — ACETAMINOPHEN 325 MG PO TABS: 650.0000 mg | ORAL_TABLET | Freq: Four times a day (QID) | ORAL | Status: DC | PRN

## 2023-03-16 MED ORDER — IOHEXOL 300 MG/ML  SOLN
80.0000 mL | Freq: Once | INTRAMUSCULAR | Status: AC | PRN
  Administered 2023-03-16: 80 mL via INTRAVENOUS

## 2023-03-16 MED ORDER — ROSUVASTATIN CALCIUM 20 MG PO TABS
20.0000 mg | ORAL_TABLET | Freq: Every day | ORAL | Status: DC
  Administered 2023-03-16 – 2023-03-17 (×2): 20 mg via ORAL
  Filled 2023-03-16 (×2): qty 1

## 2023-03-16 NOTE — Assessment & Plan Note (Signed)
Continue crestor 20mg  daily

## 2023-03-16 NOTE — ED Notes (Signed)
Pt ambulated to restroom steady gait, continues to have blood in the urine.

## 2023-03-16 NOTE — H&P (Signed)
History and Physical    Patient: Joshua Gentry WGN:[id number] DOB: [birth year] DOA: 03/16/2023 DOS: the patient was seen and examined on 03/16/2023 PCP: Assunta Found, MD  Patient coming from: Home - lives with his wife. Ambulates independently.    Chief Complaint: abnormal CXR/labs from PCP   HPI: Joshua Gentry is a 77 y.o. male with medical history significant of remote hx of right leg sarcoma, CAD s/p CABG in 1997, HTN, HLD, OSA on cpap who presented to ED with complaints of gross hematuria.  He has been coughing x 6 months every morning. He went to see his PCP yesterday AM due to blood in his urine that started last Wednesday. He states his blood is dark red every time he urinates. No pain at all. He has no urgency or frequency. He has no acute CVA tenderness.  States he has had lower back pain for years which has gotten worse. He received a steroid shot for this about 6 weeks ago and helped the pain; however, he states pain came back. No leg weakness. He is on aspirin only.   He has decreased appetite x 5 days. He has been drinking water well. No N/V/D.   Denies any fever/chills, vision changes/headaches, chest pain or palpitations, shortness of breath, abdominal pain, N/V/D, dysuria or leg swelling.    He does not smoke or drink alcohol.   ER Course:  vitals: afebrile, bp: 107/56, HR: 63, RR: 20, oxygen: 97%on 2L [place] Pertinent labs: wbc: 24.3, hgb: 10.8, sodium: 131, BUN: 35, creatinine: 1.81,  CXR: Interval development of basilar predominant patchy and nodular subpleural and airspace disease in both lung bases. Imaging features could be related to multifocal pneumonia but given nodular character in some regions, chest CT recommended to further evaluate. CT chest: numerous lung nodules identified worrisome for metastatic disease.  Possible mass lesion involving the right kidney. Gallstones. Aortic atherosclerosis and emphysema.  CT abdomen/pelvis: There is a 6.9 cm heterogeneous  lesion centered in the right kidney upper pole with protrusion into the sinus fat. This is highly concerning for renal neoplasm. There are few adjacent retrocaval lymph nodes noted with short axis less than 6 mm. Renal vein is patent. 2. Innumerable nodules in the visualized bilateral lungs, compatible with metastasis. Please refer to same-day performed CT scan chest report for details. 3. Several subcentimeter sized sclerotic foci in the T11 vertebral body, L2 vertebral body and L1 spinous process, which are indeterminate. No pathological fracture. 4. Multiple other nonacute observations, as described above. In ED: oncology was consulted. Given 1L IVF bolus and TRH asked to admit.     Review of Systems: As mentioned in the history of present illness. All other systems reviewed and are negative. Past Medical History:  Diagnosis Date   Cancer [facility]) 2006   Leg (Right) Sarcoma   Coronary artery disease    a. s/p CABG 1997.   Heart attack (HCC)    Hyperlipidemia    Hypertension    Obesity (BMI 30-39.9)    OSA on CPAP March 2016   Compliant   RBBB with left anterior fascicular block    Past Surgical History:  Procedure Laterality Date   COLONOSCOPY N/A 08/01/2013   Procedure: COLONOSCOPY;  Surgeon: Dalia Heading, MD;  Location: [facility];  Service: Gastroenterology;  Laterality: N/A;   CORONARY ARTERY BYPASS GRAFT     x4   Lens Bilateral April 2016   Replacment    LOWER LEG SOFT TISSUE TUMOR EXCISION  Social History:  reports that he quit smoking about 26 years ago. His smoking use included cigarettes. He started smoking about 58 years ago. He has a 48 pack-year smoking history. He has never used smokeless tobacco. He reports that he does not currently use alcohol. He reports that he does not use drugs.  Allergies  Allergen Reactions   Niacin    Penicillins     Family History  Problem Relation Age of Onset   Hypertension Mother    Diabetes Mother    Heart attack  Father    Diabetes Brother     Prior to Admission medications   Medication Sig Start Date End Date Taking? Authorizing Provider  amLODipine (NORVASC) 10 MG tablet TAKE 1 TABLET(10 MG) BY MOUTH DAILY 11/09/22   Pricilla Riffle, MD  aspirin 81 MG tablet Take 81 mg by mouth daily.    [provider]  calcium-vitamin D 250-100 MG-UNIT per tablet Take 1 tablet by mouth 2 (two) times daily.    [provider]  cholecalciferol (VITAMIN D) 1000 units tablet Take 1,000 Units by mouth daily.    [provider]  Coenzyme Q10 (CO Q 10) 100 MG CAPS Take 100 mg by mouth daily.    [provider]  glucosamine-chondroitin 500-400 MG tablet Take 1 tablet by mouth daily.    [provider]  hydrochlorothiazide (HYDRODIURIL) 25 MG tablet Take 1 tablet (25 mg total) by mouth daily. 01/22/23   Strader, Lennart Pall, PA-C  losartan (COZAAR) 100 MG tablet Take 1 tablet (100 mg total) by mouth daily. 01/22/23   Strader, Lennart Pall, PA-C  Magnesium 400 MG CAPS Take 400 mg by mouth daily. 04/09/15   Jodelle Gross, NP  metoprolol succinate (TOPROL XL) 100 MG 24 hr tablet Take 1 tablet (100 mg total) by mouth daily. Take with or immediately following a meal. 01/22/23   Strader, [place] M, PA-C  Multiple Vitamins-Minerals (MULTIVITAMIN WITH MINERALS) tablet Take 1 tablet by mouth daily.    [provider]  nitroGLYCERIN (NITROSTAT) 0.4 MG SL tablet DISSOLVE 1 TABLET UNDER THE TONGUE EVERY 5 MINUTES AS NEEDED FOR CHEST PAIN, DO NOT EXCEED A TOTAL OF 3 DOSES IN 15 MINUTES 11/06/22   Wendall Stade, MD  potassium chloride SA (KLOR-CON M) 20 MEQ tablet Take 2 tablets (40 mEq total) by mouth daily. 01/22/23   Strader, Lennart Pall, PA-C  rosuvastatin (CRESTOR) 20 MG tablet TAKE 1 TABLET(20 MG) BY MOUTH DAILY 12/24/22   Antoine Poche, MD  spironolactone (ALDACTONE) 25 MG tablet Take 1 tablet by mouth daily. 12/18/22   Wendall Stade, MD    Physical Exam: Vitals:   03/16/23 1915  03/16/23 1930 03/16/23 1945 03/16/23 2027  BP: (!) 112/49 (!) 106/51 (!) 111/51 118/63  Pulse: 71 68 70 66  Resp: 20 17 15 18   Temp:    97.8 F (36.6 C)  TempSrc:    Oral  SpO2: 96% 92% 93% 97%  Weight:    116.3 kg  Height:    6\' 1"  (1.854 m)   General:  Appears calm and comfortable and is in NAD Eyes:  PERRL, EOMI, normal lids, iris ENT:  grossly normal hearing, lips & tongue, mmm; appropriate dentition Neck:  no LAD, masses or thyromegaly; no carotid bruits Cardiovascular:  RRR, no m/r/g. No LE edema.  Respiratory:   CTA bilaterally with no wheezes/rales/rhonchi.  Normal respiratory effort. Abdomen:  soft, NT, ND, NABS Back:   normal alignment, no CVAT  Skin:  no rash or induration seen on limited exam Musculoskeletal:  grossly normal tone BUE/BLE, good ROM, no bony abnormality Lower extremity:  No LE edema.  Limited foot exam with no ulcerations.  2+ distal pulses. Psychiatric:  grossly normal mood and affect, speech fluent and appropriate, AOx3 Neurologic:  CN 2-12 grossly intact, moves all extremities in coordinated fashion, sensation intact   Radiological Exams on Admission: Independently reviewed - see discussion in A/P where applicable  CT ABDOMEN PELVIS W CONTRAST  Result Date: 03/16/2023 CLINICAL DATA:  Abdominal pain, acute, nonlocalized EXAM: CT ABDOMEN AND PELVIS WITH CONTRAST TECHNIQUE: Multidetector CT imaging of the abdomen and pelvis was performed using the standard protocol following bolus administration of intravenous contrast. RADIATION DOSE REDUCTION: This exam was performed according to the departmental dose-optimization program which includes automated exposure control, adjustment of the mA and/or kV according to patient size and/or use of iterative reconstruction technique. CONTRAST:  80mL OMNIPAQUE IOHEXOL 300 MG/ML  SOLN COMPARISON:  None Available. FINDINGS: Lower chest: There are innumerable nodules in the visualized bilateral lungs, compatible with  metastasis. Please refer to separately dictated CT scan chest performed earlier the same day. The heart is normal in size. No pericardial effusion. Hepatobiliary: The liver is normal in size. Non-cirrhotic configuration. There are multiple hypoattenuating structures in the liver with largest in the left hepatic dome measuring up to 3.2 x 4.3 cm, which can be characterized as a simple cyst. There are multiple subcentimeter hypoattenuating foci throughout liver, which are too small to adequately characterize but favored to represent cysts as well. They can be better characterized with contrast-enhanced MRI abdomen, if clinically indicated. No intrahepatic or extrahepatic bile duct dilation. Moderate volume dependent gallstones noted. Normal gallbladder wall thickness. No pericholecystic inflammatory changes. Pancreas: Unremarkable. No pancreatic ductal dilatation or surrounding inflammatory changes. Spleen: Within normal limits. There is a 1.4 x 1.5 cm simple cyst arising from the upper pole. Adrenals/Urinary Tract: Adrenal glands are unremarkable. There is a heterogeneous approximately 4.1 by 6.9 x 4.5 cm (anteroposterior x transverse x craniocaudal) contour deforming lesion centered in the right kidney upper pole with protrusion into the sinus fat. This is highly concerning for renal neoplasm. There are few adjacent retrocaval lymph nodes noted with short axis less than 6 mm. Renal vein is patent. No perinephric fat stranding. Bilateral kidneys are otherwise unremarkable. No hydroureteronephrosis or nephroureterolithiasis. Unremarkable urinary bladder. Stomach/Bowel: There is a small sliding hiatal hernia. No disproportionate dilation of the small or large bowel loops. No evidence of abnormal bowel wall thickening or inflammatory changes. The appendix is unremarkable. Vascular/Lymphatic: No ascites or pneumoperitoneum. No abdominal or pelvic lymphadenopathy, by size criteria. No aneurysmal dilation of the major  abdominal arteries. There are moderate peripheral atherosclerotic vascular calcifications of the aorta and its major branches. Reproductive: Enlarged prostate. Symmetric seminal vesicles. Other: There is a tiny fat containing umbilical hernia. The soft tissues and abdominal wall are otherwise unremarkable. Musculoskeletal: There are several subcentimeter sized sclerotic foci in the T11 vertebral body, L2 vertebral body and L1 spinous process, which are indeterminate. No pathological fracture. There are mild multilevel degenerative changes in the visualized spine. IMPRESSION: 1. There is a 6.9 cm heterogeneous lesion centered in the right kidney upper pole with protrusion into the sinus fat. This is highly concerning for renal neoplasm. There are few adjacent retrocaval lymph nodes noted with short axis less than 6 mm. Renal vein is patent. 2. Innumerable nodules in the visualized bilateral lungs, compatible with metastasis. Please refer to  same-day performed CT scan chest report for details. 3. Several subcentimeter sized sclerotic foci in the T11 vertebral body, L2 vertebral body and L1 spinous process, which are indeterminate. No pathological fracture. 4. Multiple other nonacute observations, as described above. Aortic Atherosclerosis (ICD10-I70.0). Electronically Signed   By: Jules Schick M.D.   On: 03/16/2023 14:22   CT Chest Wo Contrast  Result Date: 03/16/2023 CLINICAL DATA:  Lung nodules. EXAM: CT CHEST WITHOUT CONTRAST TECHNIQUE: Multidetector CT imaging of the chest was performed following the standard protocol without IV contrast. RADIATION DOSE REDUCTION: This exam was performed according to the departmental dose-optimization program which includes automated exposure control, adjustment of the mA and/or kV according to patient size and/or use of iterative reconstruction technique. COMPARISON:  X-ray 03/15/2023 FINDINGS: Cardiovascular: Status post median sternotomy. Heart is nonenlarged. No  pericardial effusion. On this non IV contrast exam thoracic aorta has a normal course and caliber. Mediastinum/Nodes: Heterogeneous thyroid gland. Slightly patulous thoracic esophagus with a small hiatal hernia. No specific abnormal lymph node enlargement identified in the axillary regions, hilum or mediastinum. Lungs/Pleura: Advanced centrilobular emphysematous lung changes greatest in the upper lung zones. There is some left apical pleural blebs as well. Areas of scarring and fibrotic changes. There is a calcified nodule seen in the lingula on image 113 of series 4. However there are numerous noncalcified lung nodules identified. The vast majority are cm in size or smaller. There are a few larger areas as well such as right lower lobe on series 4, image 115 measuring 20 by 16 mm. Left lower lobe focus on image 125 measuring 16 by 13 mm. Please correlate for any known history such as malignancy. Otherwise recommend further evaluation. No pleural effusion or pneumothorax. Upper Abdomen: Adrenal glands are preserved in the upper abdomen. Multiple stones in the nondilated gallbladder. Possible mass lesion involving the right kidney. Recommend abdominal imaging with contrast. Musculoskeletal: Advanced degenerative changes of the spine. IMPRESSION: Numerous lung nodules identified worrisome for metastatic disease. Possible mass lesion involving the right kidney. Recommend follow up contrast CT scan of the abdomen and pelvis to further delineate. Gallstones. Aortic Atherosclerosis (ICD10-I70.0) and Emphysema (ICD10-J43.9). Electronically Signed   By: Karen Kays M.D.   On: 03/16/2023 11:41   DG Chest 2 View  Result Date: 03/15/2023 CLINICAL DATA:  Chronic cough. EXAM: CHEST - 2 VIEW COMPARISON:  11/02/2017 FINDINGS: Interval development of patchy and nodular subpleural and airspace disease in both lung bases. Stable left lower lobe pulmonary nodule compatible with calcified granuloma. No substantial pleural  effusion. Lungs are hyperexpanded. Cardiopericardial silhouette is at upper limits of normal for size. No acute bony abnormality. IMPRESSION: Interval development of basilar predominant patchy and nodular subpleural and airspace disease in both lung bases. Imaging features could be related to multifocal pneumonia but given nodular character in some regions, chest CT recommended to further evaluate. Electronically Signed   By: Kennith Center M.D.   On: 03/15/2023 10:32    EKG: Independently reviewed.  NSR with rate 64; nonspecific ST changes with no evidence of acute ischemia. RBBB and LAFB. RBBB stable    Labs on Admission: I have personally reviewed the available labs and imaging studies at the time of the admission.  Pertinent labs:   wbc: 24.3,  hgb: 10.8,  sodium: 131,  BUN: 35,  creatinine: 1.81,  Assessment and Plan: Principal Problem:   Acute renal failure (ARF) (HCC) Active Problems:   Leukocytosis/SIRS   Renal mass, right   Multiple lung nodules concerning  for metastatic disesae   Essential hypertension   Normocytic anemia   Hx of CABG   Dyslipidemia, goal LDL below 70   OSA on CPAP    Assessment and Plan: * Acute renal failure (ARF) (HCC) 77 year old presenting to ED from his PCP for abnormal labs and gross hematuria and AKI found to have new metastatic disease  -obs to med-surg -check urine studies -check bladder scan -strict I/O -avoid nephrotoxic drugs (holding ARB, spironolactone and hydrochlorothiazide)  -CT abdomen with no hydronephrosis, stones, blockage, but renal mass.  -gentle IVF -trend    Leukocytosis/SIRS He has no fever/chills or symptoms suggestive of infection except cough x 6 months  SIRS criteria resolved except for leukocytosis  Gross hematuria, but no urinary symptoms Urine cultures/blood cx pending CT chest with no pneumonia, CXR with basilar dx. Will give one dose of azith/rocephin today  Concerns for malignancy Smear pending  Gentle IVF  and trend   Renal mass, right 6.9cm heterogeneous lesion centered in the right kidney upper pole with protrusion into the left sinus fat. Highly concerning for renal neoplasm.  Hematuria possibly secondary to malignancy. No stones on CT  Will need urology consult/outpatient f/u   Multiple lung nodules concerning for metastatic disesae Oncology has been consulted ? Primary renal?    Normocytic anemia Has gross hematuria, no recent labs for comparison for baseline ? ACD with possible malignancy  Iron studies pending Trend   Essential hypertension Home bp runs 100-110/70 systolic Soft here, hold meds for now   Hx of CABG Continue medical management  Hold ASA with gross hematuria and anemia   Dyslipidemia, goal LDL below 70 Continue crestor 20mg  daily   OSA on CPAP Continue cpap at night        Advance Care Planning:   Code Status: Full Code   Consults: onocology/urology   DVT Prophylaxis: SCDs  Family Communication: wife at bedside   Severity of Illness: The appropriate patient status for this patient is OBSERVATION. Observation status is judged to be reasonable and necessary in order to provide the required intensity of service to ensure the patient's safety. The patient's presenting symptoms, physical exam findings, and initial radiographic and laboratory data in the context of their medical condition is felt to place them at decreased risk for further clinical deterioration. Furthermore, it is anticipated that the patient will be medically stable for discharge from the hospital within 2 midnights of admission.   Author: Orland Mustard, MD 03/16/2023 10:31 PM  For on call review [email or web address].

## 2023-03-16 NOTE — ED Notes (Signed)
ED TO INPATIENT HANDOFF REPORT  ED Nurse Name and Phone #: Fredric Mare [phone]  Z Name/Age/Gender Joshua Gentry 77 y.o. male Room/Bed: APA19/APA19  Code Status   Code Status: Full Code  Home/SNF/Other Home Patient oriented to: self, place, time, and situation Is this baseline? Yes   Triage Complete: Triage complete  Chief Complaint Acute renal failure (ARF) (HCC) [N17.9]  Triage Note Pt here via POV with reports of elevated WBC's and testing positive for Hep B. Seen by PCP yesterday for hematuria. PCP noted pt with abnormal breath sounds to bil lower lungs, did chest xray and informed pt to have chest CT today. Spouse states pt has had a "junky" cough.    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 s/p CABG 1997.   Heart attack (HCC)    Hyperlipidemia    Hypertension    Obesity (BMI 30-39.9)    OSA on CPAP March 2016   Compliant   RBBB with left anterior fascicular block    Past Surgical History:  Procedure Laterality Date   COLONOSCOPY N/A 08/01/2013   Procedure: COLONOSCOPY;  Surgeon: Dalia Heading, MD;  Location: [facility];  Service: Gastroenterology;  Laterality: N/A;   CORONARY ARTERY BYPASS GRAFT     x4   Lens Bilateral April 2016   Replacment    LOWER LEG SOFT TISSUE TUMOR EXCISION       A IV Location/Drains/Wounds Patient Lines/Drains/Airways Status      Active Line/Drains/Airways     Name Placement date Placement time Site Days   Peripheral IV 03/16/23 20 G Posterior;Right Wrist 03/16/23  1125  Wrist  less than 1            Intake/Output Last 24 hours  Intake/Output Summary (Last 24 hours) at 03/16/2023 1952 Last data filed at 03/16/2023 1847 Gross per 24 hour  Intake 1000 ml  Output 200 ml  Net 800 ml    Labs/Imaging Results for orders placed or performed during the hospital encounter of 03/16/23 (from the past 48 hour(s))  Comprehensive metabolic panel     Status: Abnormal   Collection Time: 03/16/23 11:24 AM  Result Value Ref Range   Sodium 131 (L) 135 - 145 mmol/L   Potassium 4.9 3.5 - 5.1 mmol/L   Chloride 102 98 - 111 mmol/L   CO2 21 (L) 22 - 32 mmol/L   Glucose, Bld 115 (H) 70 - 99 mg/dL    Comment: Glucose reference range applies only to samples taken after fasting for at least 8 hours.   BUN 35 (H) 8 - 23 mg/dL   Creatinine, Ser 4.40 (H) 0.61 - 1.24 mg/dL   Calcium 9.8 8.9 - 10.2 mg/dL   Total Protein 6.8 6.5 - 8.1 g/dL   Albumin 3.1 (L) 3.5 - 5.0 g/dL   AST 15 15 -  41 U/L   ALT 13 0 - 44 U/L   Alkaline Phosphatase 74 38 - 126 U/L   Total Bilirubin 0.9 0.3 - 1.2 mg/dL   GFR, Estimated 39 (L) >60 mL/min    Comment: (NOTE) Calculated using the CKD-EPI Creatinine Equation (2021)    Anion gap 8 5 - 15    Comment: Performed at [facility], [address] [place], [place] [address]  Brain natriuretic peptide     Status: None   Collection Time: 03/16/23 11:24 AM  Result Value Ref Range   B Natriuretic Peptide 60.0 0.0 - 100.0 pg/mL    Comment: Performed at [facility], [address], [place], [place] [address]  CBC with Differential     Status: Abnormal   Collection Time: 03/16/23 11:24 AM  Result Value Ref Range   WBC 24.3 (H) 4.0 - 10.5 K/uL   RBC 3.89 (L) 4.22 - 5.81 MIL/uL   Hemoglobin 10.8 (L) 13.0 - 17.0 g/dL   HCT 69.6 (L) 29.5 - 28.4 %   MCV 86.9 80.0 - 100.0 fL   MCH 27.8 26.0 - 34.0 pg   MCHC  32.0 30.0 - 36.0 g/dL   RDW 13.2 44.0 - 10.2 %   Platelets 264 150 - 400 K/uL   nRBC 0.0 0.0 - 0.2 %   Neutrophils Relative % 81 %   Neutro Abs 20.0 (H) 1.7 - 7.7 K/uL   Lymphocytes Relative 8 %   Lymphs Abs 1.9 0.7 - 4.0 K/uL   Monocytes Relative 5 %   Monocytes Absolute 1.3 (H) 0.1 - 1.0 K/uL   Eosinophils Relative 3 %   Eosinophils Absolute 0.7 (H) 0.0 - 0.5 K/uL   Basophils Relative 1 %   Basophils Absolute 0.1 0.0 - 0.1 K/uL   Immature Granulocytes 2 %   Abs Immature Granulocytes 0.41 (H) 0.00 - 0.07 K/uL    Comment: Performed at [facility], [address] [place], [place] [address]  Ferritin     Status: Abnormal   Collection Time: 03/16/23 11:24 AM  Result Value Ref Range   Ferritin 436 (H) 24 - 336 ng/mL    Comment: Performed at [facility], [address], [place], [place] [address]  Iron and TIBC     Status: Abnormal   Collection Time: 03/16/23 11:24 AM  Result Value Ref Range   Iron 26 (L) 45 - 182 ug/dL   TIBC 474 (L) 259 - 563 ug/dL   Saturation Ratios 11 (L) 17.9 - 39.5 %   UIBC 208 ug/dL    Comment: Performed at [facility], [address], [place], [place] [address]  Urinalysis, Routine w reflex microscopic -Urine, Clean Catch     Status: Abnormal   Collection Time: 03/16/23  1:01 PM  Result Value Ref Range   Color, Urine RED (A) YELLOW    Comment: BIOCHEMICALS MAY BE AFFECTED BY COLOR   APPearance HAZY (A) CLEAR   Specific Gravity, Urine 1.012 1.005 - 1.030   pH 6.0 5.0 - 8.0   Glucose, UA NEGATIVE NEGATIVE mg/dL   Hgb urine dipstick LARGE (A) NEGATIVE   Bilirubin Urine NEGATIVE NEGATIVE   Ketones, ur NEGATIVE NEGATIVE mg/dL   Protein, ur 332 (A) NEGATIVE mg/dL   Nitrite NEGATIVE NEGATIVE   Leukocytes,Ua TRACE (A) NEGATIVE   RBC / HPF >50 0 - 5 RBC/hpf   WBC, UA 0-5 0 - 5 WBC/hpf   Bacteria, UA NONE SEEN NONE SEEN   Squamous Epithelial / HPF 0-5 0 -  5 /HPF    Comment: Performed at [facility], [address] [place], [place] [address]  Sodium,  urine, random     Status: None   Collection Time: 03/16/23  1:01 PM  Result Value Ref Range   Sodium, Ur 59 mmol/L    Comment: Performed at [facility], [address], [place], [place] [address]  Creatinine, urine, random     Status: None   Collection Time: 03/16/23  1:01 PM  Result Value Ref Range   Creatinine, Urine 127 mg/dL    Comment: Performed at [facility], [address] [place], [place] [address]  Culture, blood (Routine X 2) w Reflex to ID Panel     Status: None (Preliminary result)   Collection Time: 03/16/23  5:59 PM   Specimen: BLOOD  Result Value Ref Range   Specimen Description BLOOD BLOOD RIGHT ARM    Special Requests      BOTTLES DRAWN AEROBIC AND ANAEROBIC Blood Culture adequate volume Performed at [facility], [address] [place], [place] [address]    Culture PENDING    Report Status PENDING   CBC     Status: Abnormal   Collection Time: 03/16/23  5:59 PM  Result Value Ref Range   WBC 25.9 (H) 4.0 - 10.5 K/uL   RBC 3.78 (L) 4.22 - 5.81 MIL/uL   Hemoglobin 10.7 (L) 13.0 - 17.0 g/dL   HCT 65.7 (L) 84.6 - 96.2 %   MCV 89.7 80.0 - 100.0 fL   MCH 28.3 26.0 - 34.0 pg   MCHC 31.6 30.0 - 36.0 g/dL   RDW 95.2 84.1 - 32.4 %   Platelets 277 150 - 400 K/uL   nRBC 0.0 0.0 - 0.2 %    Comment: Performed at [facility], [address] [place], [place] [address]  Culture, blood (Routine X 2) w Reflex to ID Panel     Status: None (Preliminary result)   Collection Time: 03/16/23  6:03 PM   Specimen: BLOOD  Result Value Ref Range   Specimen Description BLOOD BLOOD LEFT ARM    Special Requests      BOTTLES DRAWN AEROBIC AND ANAEROBIC Blood Culture adequate volume Performed at [facility], [address], [place], [place] [address]    Culture PENDING    Report Status PENDING    CT ABDOMEN PELVIS W CONTRAST  Result Date: 03/16/2023 CLINICAL DATA:  Abdominal pain, acute, nonlocalized EXAM: CT ABDOMEN AND PELVIS WITH CONTRAST TECHNIQUE: Multidetector CT imaging of  the abdomen and pelvis was performed using the standard protocol following bolus administration of intravenous contrast. RADIATION DOSE REDUCTION: This exam was performed according to the departmental dose-optimization program which includes automated exposure control, adjustment of the mA and/or kV according to patient size and/or use of iterative reconstruction technique. CONTRAST:  80mL OMNIPAQUE IOHEXOL 300 MG/ML  SOLN COMPARISON:  None Available. FINDINGS: Lower chest: There are innumerable nodules in the visualized bilateral lungs, compatible with metastasis. Please refer to separately dictated CT scan chest performed earlier the same day. The heart is normal in size. No pericardial effusion. Hepatobiliary: The liver is normal in size. Non-cirrhotic configuration. There are multiple hypoattenuating structures in the liver with largest in the left hepatic dome measuring up to 3.2 x 4.3 cm, which can be characterized as a simple cyst. There are multiple subcentimeter hypoattenuating foci throughout liver, which are too small to adequately characterize but favored to represent cysts as well. They can be better characterized with contrast-enhanced MRI abdomen, if  clinically indicated. No intrahepatic or extrahepatic bile duct dilation. Moderate volume dependent gallstones noted. Normal gallbladder wall thickness. No pericholecystic inflammatory changes. Pancreas: Unremarkable. No pancreatic ductal dilatation or surrounding inflammatory changes. Spleen: Within normal limits. There is a 1.4 x 1.5 cm simple cyst arising from the upper pole. Adrenals/Urinary Tract: Adrenal glands are unremarkable. There is a heterogeneous approximately 4.1 by 6.9 x 4.5 cm (anteroposterior x transverse x craniocaudal) contour deforming lesion centered in the right kidney upper pole with protrusion into the sinus fat. This is highly concerning for renal neoplasm. There are few adjacent retrocaval lymph nodes noted with short axis less  than 6 mm. Renal vein is patent. No perinephric fat stranding. Bilateral kidneys are otherwise unremarkable. No hydroureteronephrosis or nephroureterolithiasis. Unremarkable urinary bladder. Stomach/Bowel: There is a small sliding hiatal hernia. No disproportionate dilation of the small or large bowel loops. No evidence of abnormal bowel wall thickening or inflammatory changes. The appendix is unremarkable. Vascular/Lymphatic: No ascites or pneumoperitoneum. No abdominal or pelvic lymphadenopathy, by size criteria. No aneurysmal dilation of the major abdominal arteries. There are moderate peripheral atherosclerotic vascular calcifications of the aorta and its major branches. Reproductive: Enlarged prostate. Symmetric seminal vesicles. Other: There is a tiny fat containing umbilical hernia. The soft tissues and abdominal wall are otherwise unremarkable. Musculoskeletal: There are several subcentimeter sized sclerotic foci in the T11 vertebral body, L2 vertebral body and L1 spinous process, which are indeterminate. No pathological fracture. There are mild multilevel degenerative changes in the visualized spine. IMPRESSION: 1. There is a 6.9 cm heterogeneous lesion centered in the right kidney upper pole with protrusion into the sinus fat. This is highly concerning for renal neoplasm. There are few adjacent retrocaval lymph nodes noted with short axis less than 6 mm. Renal vein is patent. 2. Innumerable nodules in the visualized bilateral lungs, compatible with metastasis. Please refer to same-day performed CT scan chest report for details. 3. Several subcentimeter sized sclerotic foci in the T11 vertebral body, L2 vertebral body and L1 spinous process, which are indeterminate. No pathological fracture. 4. Multiple other nonacute observations, as described above. Aortic Atherosclerosis (ICD10-I70.0). Electronically Signed   By: Jules Schick M.D.   On: 03/16/2023 14:22   CT Chest Wo Contrast  Result Date:  03/16/2023 CLINICAL DATA:  Lung nodules. EXAM: CT CHEST WITHOUT CONTRAST TECHNIQUE: Multidetector CT imaging of the chest was performed following the standard protocol without IV contrast. RADIATION DOSE REDUCTION: This exam was performed according to the departmental dose-optimization program which includes automated exposure control, adjustment of the mA and/or kV according to patient size and/or use of iterative reconstruction technique. COMPARISON:  X-ray 03/15/2023 FINDINGS: Cardiovascular: Status post median sternotomy. Heart is nonenlarged. No pericardial effusion. On this non IV contrast exam thoracic aorta has a normal course and caliber. Mediastinum/Nodes: Heterogeneous thyroid gland. Slightly patulous thoracic esophagus with a small hiatal hernia. No specific abnormal lymph node enlargement identified in the axillary regions, hilum or mediastinum. Lungs/Pleura: Advanced centrilobular emphysematous lung changes greatest in the upper lung zones. There is some left apical pleural blebs as well. Areas of scarring and fibrotic changes. There is a calcified nodule seen in the lingula on image 113 of series 4. However there are numerous noncalcified lung nodules identified. The vast majority are cm in size or smaller. There are a few larger areas as well such as right lower lobe on series 4, image 115 measuring 20 by 16 mm. Left lower lobe focus on image 125 measuring 16 by 13 mm. Please  correlate for any known history such as malignancy. Otherwise recommend further evaluation. No pleural effusion or pneumothorax. Upper Abdomen: Adrenal glands are preserved in the upper abdomen. Multiple stones in the nondilated gallbladder. Possible mass lesion involving the right kidney. Recommend abdominal imaging with contrast. Musculoskeletal: Advanced degenerative changes of the spine. IMPRESSION: Numerous lung nodules identified worrisome for metastatic disease. Possible mass lesion involving the right kidney. Recommend  follow up contrast CT scan of the abdomen and pelvis to further delineate. Gallstones. Aortic Atherosclerosis (ICD10-I70.0) and Emphysema (ICD10-J43.9). Electronically Signed   By: Karen Kays M.D.   On: 03/16/2023 11:41   DG Chest 2 View  Result Date: 03/15/2023 CLINICAL DATA:  Chronic cough. EXAM: CHEST - 2 VIEW COMPARISON:  11/02/2017 FINDINGS: Interval development of patchy and nodular subpleural and airspace disease in both lung bases. Stable left lower lobe pulmonary nodule compatible with calcified granuloma. No substantial pleural effusion. Lungs are hyperexpanded. Cardiopericardial silhouette is at upper limits of normal for size. No acute bony abnormality. IMPRESSION: Interval development of basilar predominant patchy and nodular subpleural and airspace disease in both lung bases. Imaging features could be related to multifocal pneumonia but given nodular character in some regions, chest CT recommended to further evaluate. Electronically Signed   By: Kennith Center M.D.   On: 03/15/2023 10:32    Pending Labs Unresulted Labs (From admission, onward)     Start     Ordered   03/17/23 0500  Basic metabolic panel  Tomorrow morning,   R        03/16/23 1745   03/17/23 0500  CBC  Tomorrow morning,   R        03/16/23 1745   03/16/23 1650  Urine Culture (for pregnant, neutropenic or urologic patients or patients with an indwelling urinary catheter)  (Urine Labs)  Once,   URGENT       Question:  Indication  Answer:  Acute gross hematuria   03/16/23 1649            Vitals/Pain Today's Vitals   03/16/23 1830 03/16/23 1852 03/16/23 1904 03/16/23 1920  BP: (!) 123/58  (!) 111/50   Pulse: 67  65   Resp: 20  17   Temp:   97.7 F (36.5 C)   TempSrc:   Oral   SpO2: 95%  92%   Weight:      Height:      PainSc:  0-No pain 0-No pain 0-No pain    Isolation Precautions No active isolations  Medications Medications  rosuvastatin (CRESTOR) tablet 20 mg (has no administration in time  range)  0.9 %  sodium chloride infusion ( Intravenous Rate/Dose Verify 03/16/23 1925)  acetaminophen (TYLENOL) tablet 650 mg (has no administration in time range)    Or  acetaminophen (TYLENOL) suppository 650 mg (has no administration in time range)  sodium chloride 0.9 % bolus 1,000 mL (0 mLs Intravenous Stopped 03/16/23 1323)  iohexol (OMNIPAQUE) 300 MG/ML solution 80 mL (80 mLs Intravenous Contrast Given 03/16/23 1310)    Mobility walks     Focused Assessments Cardiac Assessment Handoff:  Cardiac Rhythm: Normal sinus rhythm No results found for: "CKTOTAL", "CKMB", "CKMBINDEX", "TROPONINI" No results found for: "DDIMER" Does the Patient currently have chest pain? No   , Renal Assessment Handoff:  Elmer Bales Urine Strict I&O  , Pulmonary Assessment Handoff:  Lung sounds: Bilateral Breath Sounds: Clear O2 Device: Nasal Cannula (CPAP at nighttime) O2 Flow Rate (L/min): 2 L/min (92-93% on nasal cannula)  R Recommendations: See Admitting Provider Note  Report given to:   Additional Notes:

## 2023-03-16 NOTE — Assessment & Plan Note (Addendum)
Continue medical management  Hold ASA with gross hematuria and anemia

## 2023-03-16 NOTE — Assessment & Plan Note (Signed)
Continue cpap at night  

## 2023-03-16 NOTE — ED Notes (Signed)
Patient transported to CT 

## 2023-03-16 NOTE — ED Provider Notes (Signed)
  Physical Exam  BP 128/62 (BP Location: Right Arm)   Pulse 73   Temp 98.7 F (37.1 C) (Oral)   Resp 18   Ht 6\' 1"  (1.854 m)   Wt 116.5 kg   SpO2 97%   BMI 33.89 kg/m   Physical Exam  Procedures  Procedures  ED Course / MDM   Clinical Course as of 03/17/23 1109  Tue Mar 16, 2023  1601 Assumed care from Dr. Jeraldine Loots.  77 year old male with remote history of right lower extremity sarcoma and CAD who presented to the emergency department with abnormal chest x-ray, fatigue, and hematuria.  CT scan today shows that he may have metastatic right kidney cancer with lung and spine mets.  Waiting to hear back from Dr. Ellin Saba from oncology.  Does appear to have an AKI at this time. [RP]  1649 Dr Artis Flock from the hospitalist to admit the patient for his acute kidney injury and for further evaluation of his possible mass cancer.  Of note patient was noted to have a new oxygen requirement of 2 L but is not complaining of any shortness of breath lung sounds were clear to auscultation bilaterally.  Have not heard back from oncology at this time. [RP]    Clinical Course User Index [RP] Rondel Baton, MD   Medical Decision Making Amount and/or Complexity of Data Reviewed Labs: ordered. Radiology: ordered.  Risk Prescription drug management. Decision regarding hospitalization.      Rondel Baton, MD 03/17/23 [phone]

## 2023-03-16 NOTE — Assessment & Plan Note (Signed)
77 year old presenting to ED from his PCP for abnormal labs and gross hematuria and AKI found to have new metastatic disease  -obs to med-surg -check urine studies -check bladder scan -strict I/O -avoid nephrotoxic drugs (holding ARB, spironolactone and hydrochlorothiazide)  -CT abdomen with no hydronephrosis, stones, blockage, but renal mass.  -gentle IVF -trend

## 2023-03-16 NOTE — Assessment & Plan Note (Signed)
Home bp runs 100-110/70 systolic Soft here, hold meds for now

## 2023-03-16 NOTE — ED Notes (Signed)
Pt uses home CPAP wife states that she is going home and will bring it back to hospital for him tonight.

## 2023-03-16 NOTE — Assessment & Plan Note (Signed)
Oncology has been consulted ? Primary renal?

## 2023-03-16 NOTE — ED Notes (Signed)
Pt ambulated to restroom to collect urine sample.

## 2023-03-16 NOTE — ED Provider Notes (Signed)
[place] EMERGENCY DEPARTMENT AT [facility] Provider Note   CSN: [id number] Arrival date & time: 03/16/23  4098     History  Chief Complaint  Patient presents with   Abnormal Lab    Joshua Gentry is a 77 y.o. male.  HPI Patient presents with his wife who assists with history.  Patient presents with concern fatigue, hematuria.  Patient has had cough for about 6 months as well.  After speaking with his physician yesterday, and having abnormal chest x-ray he was sent here for evaluation today.  Patient while resting denies complaints, does describe the aforementioned concerns.  No inability to urinate, no syncope, no chest pain.  He does have a history of sarcoma in the distant past.     Home Medications Prior to Admission medications   Medication Sig Start Date End Date Taking? Authorizing Provider  amLODipine (NORVASC) 10 MG tablet TAKE 1 TABLET(10 MG) BY MOUTH DAILY 11/09/22   Pricilla Riffle, MD  aspirin 81 MG tablet Take 81 mg by mouth daily.    [provider]  calcium-vitamin D 250-100 MG-UNIT per tablet Take 1 tablet by mouth 2 (two) times daily.    [provider]  cholecalciferol (VITAMIN D) 1000 units tablet Take 1,000 Units by mouth daily.    [provider]  Coenzyme Q10 (CO Q 10) 100 MG CAPS Take 100 mg by mouth daily.    [provider]  glucosamine-chondroitin 500-400 MG tablet Take 1 tablet by mouth daily.    [provider]  hydrochlorothiazide (HYDRODIURIL) 25 MG tablet Take 1 tablet (25 mg total) by mouth daily. 01/22/23   Strader, Lennart Pall, PA-C  losartan (COZAAR) 100 MG tablet Take 1 tablet (100 mg total) by mouth daily. 01/22/23   Strader, Lennart Pall, PA-C  Magnesium 400 MG CAPS Take 400 mg by mouth daily. 04/09/15   Jodelle Gross, NP  metoprolol succinate (TOPROL XL) 100 MG 24 hr tablet Take 1 tablet (100 mg total) by mouth daily. Take with or immediately following a meal. 01/22/23   Strader, [place] M,  PA-C  Multiple Vitamins-Minerals (MULTIVITAMIN WITH MINERALS) tablet Take 1 tablet by mouth daily.    [provider]  nitroGLYCERIN (NITROSTAT) 0.4 MG SL tablet DISSOLVE 1 TABLET UNDER THE TONGUE EVERY 5 MINUTES AS NEEDED FOR CHEST PAIN, DO NOT EXCEED A TOTAL OF 3 DOSES IN 15 MINUTES 11/06/22   Wendall Stade, MD  potassium chloride SA (KLOR-CON M) 20 MEQ tablet Take 2 tablets (40 mEq total) by mouth daily. 01/22/23   Strader, Lennart Pall, PA-C  rosuvastatin (CRESTOR) 20 MG tablet TAKE 1 TABLET(20 MG) BY MOUTH DAILY 12/24/22   Antoine Poche, MD  spironolactone (ALDACTONE) 25 MG tablet Take 1 tablet by mouth daily. 12/18/22   Wendall Stade, MD      Allergies    Niacin and Penicillins    Review of Systems   Review of Systems  All other systems reviewed and are negative.   Physical Exam Updated Vital Signs BP (!) 107/56 (BP Location: Left Arm)   Pulse 63   Temp 97.9 F (36.6 C) (Oral)   Resp 20   Ht 6' (1.829 m)   Wt 117.9 kg   SpO2 97%   BMI 35.26 kg/m  Physical Exam Vitals and nursing note reviewed.  Constitutional:      General: He is not in acute distress.    Appearance: He is well-developed.  HENT:  Head: Normocephalic and atraumatic.  Eyes:     Conjunctiva/sclera: Conjunctivae normal.  Cardiovascular:     Rate and Rhythm: Normal rate and regular rhythm.  Pulmonary:     Effort: Pulmonary effort is normal. No respiratory distress.     Breath sounds: No stridor.  Abdominal:     General: There is no distension.  Skin:    General: Skin is warm and dry.  Neurological:     Mental Status: He is alert and oriented to person, place, and time.     ED Results / Procedures / Treatments   Labs (all labs ordered are listed, but only abnormal results are displayed) Labs Reviewed  COMPREHENSIVE METABOLIC PANEL - Abnormal; Notable for the following components:      Result Value   Sodium 131 (*)    CO2 21 (*)    Glucose, Bld 115 (*)    BUN 35 (*)     Creatinine, Ser 1.81 (*)    Albumin 3.1 (*)    GFR, Estimated 39 (*)    All other components within normal limits  CBC WITH DIFFERENTIAL/PLATELET - Abnormal; Notable for the following components:   WBC 24.3 (*)    RBC 3.89 (*)    Hemoglobin 10.8 (*)    HCT 33.8 (*)    Neutro Abs 20.0 (*)    Monocytes Absolute 1.3 (*)    Eosinophils Absolute 0.7 (*)    Abs Immature Granulocytes 0.41 (*)    All other components within normal limits  URINALYSIS, ROUTINE W REFLEX MICROSCOPIC - Abnormal; Notable for the following components:   Color, Urine RED (*)    APPearance HAZY (*)    Hgb urine dipstick LARGE (*)    Protein, ur 100 (*)    Leukocytes,Ua TRACE (*)    All other components within normal limits  BRAIN NATRIURETIC PEPTIDE    EKG None  Radiology CT ABDOMEN PELVIS W CONTRAST  Result Date: 03/16/2023 CLINICAL DATA:  Abdominal pain, acute, nonlocalized EXAM: CT ABDOMEN AND PELVIS WITH CONTRAST TECHNIQUE: Multidetector CT imaging of the abdomen and pelvis was performed using the standard protocol following bolus administration of intravenous contrast. RADIATION DOSE REDUCTION: This exam was performed according to the departmental dose-optimization program which includes automated exposure control, adjustment of the mA and/or kV according to patient size and/or use of iterative reconstruction technique. CONTRAST:  80mL OMNIPAQUE IOHEXOL 300 MG/ML  SOLN COMPARISON:  None Available. FINDINGS: Lower chest: There are innumerable nodules in the visualized bilateral lungs, compatible with metastasis. Please refer to separately dictated CT scan chest performed earlier the same day. The heart is normal in size. No pericardial effusion. Hepatobiliary: The liver is normal in size. Non-cirrhotic configuration. There are multiple hypoattenuating structures in the liver with largest in the left hepatic dome measuring up to 3.2 x 4.3 cm, which can be characterized as a simple cyst. There are multiple  subcentimeter hypoattenuating foci throughout liver, which are too small to adequately characterize but favored to represent cysts as well. They can be better characterized with contrast-enhanced MRI abdomen, if clinically indicated. No intrahepatic or extrahepatic bile duct dilation. Moderate volume dependent gallstones noted. Normal gallbladder wall thickness. No pericholecystic inflammatory changes. Pancreas: Unremarkable. No pancreatic ductal dilatation or surrounding inflammatory changes. Spleen: Within normal limits. There is a 1.4 x 1.5 cm simple cyst arising from the upper pole. Adrenals/Urinary Tract: Adrenal glands are unremarkable. There is a heterogeneous approximately 4.1 by 6.9 x 4.5 cm (anteroposterior x transverse x craniocaudal) contour deforming lesion  centered in the right kidney upper pole with protrusion into the sinus fat. This is highly concerning for renal neoplasm. There are few adjacent retrocaval lymph nodes noted with short axis less than 6 mm. Renal vein is patent. No perinephric fat stranding. Bilateral kidneys are otherwise unremarkable. No hydroureteronephrosis or nephroureterolithiasis. Unremarkable urinary bladder. Stomach/Bowel: There is a small sliding hiatal hernia. No disproportionate dilation of the small or large bowel loops. No evidence of abnormal bowel wall thickening or inflammatory changes. The appendix is unremarkable. Vascular/Lymphatic: No ascites or pneumoperitoneum. No abdominal or pelvic lymphadenopathy, by size criteria. No aneurysmal dilation of the major abdominal arteries. There are moderate peripheral atherosclerotic vascular calcifications of the aorta and its major branches. Reproductive: Enlarged prostate. Symmetric seminal vesicles. Other: There is a tiny fat containing umbilical hernia. The soft tissues and abdominal wall are otherwise unremarkable. Musculoskeletal: There are several subcentimeter sized sclerotic foci in the T11 vertebral body, L2  vertebral body and L1 spinous process, which are indeterminate. No pathological fracture. There are mild multilevel degenerative changes in the visualized spine. IMPRESSION: 1. There is a 6.9 cm heterogeneous lesion centered in the right kidney upper pole with protrusion into the sinus fat. This is highly concerning for renal neoplasm. There are few adjacent retrocaval lymph nodes noted with short axis less than 6 mm. Renal vein is patent. 2. Innumerable nodules in the visualized bilateral lungs, compatible with metastasis. Please refer to same-day performed CT scan chest report for details. 3. Several subcentimeter sized sclerotic foci in the T11 vertebral body, L2 vertebral body and L1 spinous process, which are indeterminate. No pathological fracture. 4. Multiple other nonacute observations, as described above. Aortic Atherosclerosis (ICD10-I70.0). Electronically Signed   By: Jules Schick M.D.   On: 03/16/2023 14:22   CT Chest Wo Contrast  Result Date: 03/16/2023 CLINICAL DATA:  Lung nodules. EXAM: CT CHEST WITHOUT CONTRAST TECHNIQUE: Multidetector CT imaging of the chest was performed following the standard protocol without IV contrast. RADIATION DOSE REDUCTION: This exam was performed according to the departmental dose-optimization program which includes automated exposure control, adjustment of the mA and/or kV according to patient size and/or use of iterative reconstruction technique. COMPARISON:  X-ray 03/15/2023 FINDINGS: Cardiovascular: Status post median sternotomy. Heart is nonenlarged. No pericardial effusion. On this non IV contrast exam thoracic aorta has a normal course and caliber. Mediastinum/Nodes: Heterogeneous thyroid gland. Slightly patulous thoracic esophagus with a small hiatal hernia. No specific abnormal lymph node enlargement identified in the axillary regions, hilum or mediastinum. Lungs/Pleura: Advanced centrilobular emphysematous lung changes greatest in the upper lung zones.  There is some left apical pleural blebs as well. Areas of scarring and fibrotic changes. There is a calcified nodule seen in the lingula on image 113 of series 4. However there are numerous noncalcified lung nodules identified. The vast majority are cm in size or smaller. There are a few larger areas as well such as right lower lobe on series 4, image 115 measuring 20 by 16 mm. Left lower lobe focus on image 125 measuring 16 by 13 mm. Please correlate for any known history such as malignancy. Otherwise recommend further evaluation. No pleural effusion or pneumothorax. Upper Abdomen: Adrenal glands are preserved in the upper abdomen. Multiple stones in the nondilated gallbladder. Possible mass lesion involving the right kidney. Recommend abdominal imaging with contrast. Musculoskeletal: Advanced degenerative changes of the spine. IMPRESSION: Numerous lung nodules identified worrisome for metastatic disease. Possible mass lesion involving the right kidney. Recommend follow up contrast CT scan of  the abdomen and pelvis to further delineate. Gallstones. Aortic Atherosclerosis (ICD10-I70.0) and Emphysema (ICD10-J43.9). Electronically Signed   By: Karen Kays M.D.   On: 03/16/2023 11:41   DG Chest 2 View  Result Date: 03/15/2023 CLINICAL DATA:  Chronic cough. EXAM: CHEST - 2 VIEW COMPARISON:  11/02/2017 FINDINGS: Interval development of patchy and nodular subpleural and airspace disease in both lung bases. Stable left lower lobe pulmonary nodule compatible with calcified granuloma. No substantial pleural effusion. Lungs are hyperexpanded. Cardiopericardial silhouette is at upper limits of normal for size. No acute bony abnormality. IMPRESSION: Interval development of basilar predominant patchy and nodular subpleural and airspace disease in both lung bases. Imaging features could be related to multifocal pneumonia but given nodular character in some regions, chest CT recommended to further evaluate. Electronically  Signed   By: Kennith Center M.D.   On: 03/15/2023 10:32    Procedures Procedures    Medications Ordered in ED Medications  sodium chloride 0.9 % bolus 1,000 mL (0 mLs Intravenous Stopped 03/16/23 1323)  iohexol (OMNIPAQUE) 300 MG/ML solution 80 mL (80 mLs Intravenous Contrast Given 03/16/23 1310)    ED Course/ Medical Decision Making/ A&P Clinical Course as of 03/16/23 1622  Tue Mar 16, 2023  1601 Assumed care from Dr. Jeraldine Loots.  77 year old male with remote history of right lower extremity sarcoma and CAD who presented to the emergency department with abnormal chest x-ray, fatigue, and hematuria.  CT scan today shows that he may have metastatic right kidney cancer with lung and spine mets.  Waiting to hear back from Dr. Ellin Saba from oncology.  Does appear to have an AKI at this time. [RP]    Clinical Course User Index [RP] Rondel Baton, MD                                 Medical Decision Making Adult male with history of sarcoma, hypertension, now presents with hematuria fatigue, abnormal chest x-ray and positive hep B testing of some sort performed as an outpatient.  Patient's vital signs are reassuring, but given these concerns broad differential including malignancy, pneumonia, bacteremia, sepsis, cystitis considered. Cardiac 80 sinus normal Pulse ox 100% room air normal   Amount and/or Complexity of Data Reviewed Independent Historian: spouse External Data Reviewed: notes. Labs: ordered. Decision-making details documented in ED Course. Radiology: ordered and independent interpretation performed. Decision-making details documented in ED Course. ECG/medicine tests: ordered and independent interpretation performed. Decision-making details documented in ED Course.  Risk Prescription drug management. Decision regarding hospitalization.   4:22 PM At bedside I demonstrated the radiographic images to the patient and his wife.  Specifically illustrated renal mass, possible  malignancy, possible metastases and it does not scare to the patient's renal dysfunction via lab studies. On signout patient is awaiting callback from oncology for consideration of next steps.        Final Clinical Impression(s) / ED Diagnoses Final diagnoses:  Renal mass  Gross hematuria  Renal dysfunction     Gerhard Munch, MD 03/16/23 1623

## 2023-03-16 NOTE — ED Triage Notes (Signed)
Pt here via POV with reports of elevated WBC's and testing positive for Hep B. Seen by PCP yesterday for hematuria. PCP noted pt with abnormal breath sounds to bil lower lungs, did chest xray and informed pt to have chest CT today. Spouse states pt has had a "junky" cough.

## 2023-03-16 NOTE — Assessment & Plan Note (Addendum)
-  He has no fever/chills or symptoms suggestive of infection except increased frequency and positive hematuria. -Complete a total of 7 days empirical oral antibiotics using third-generation cephalosporin at time of discharge. -Patient advised to maintain adequate hydration -Continue to follow WBC trend with repeat CBC at follow-up visit.

## 2023-03-16 NOTE — Assessment & Plan Note (Addendum)
6.9cm heterogeneous lesion centered in the right kidney upper pole with protrusion into the left sinus fat. Highly concerning for renal neoplasm.  Hematuria possibly secondary to malignancy. No stones on CT  Will need urology consult/outpatient f/u

## 2023-03-16 NOTE — ED Notes (Signed)
Pt ambulated to restroom given urinal to measure and educated patient about strict intake and output monitoring.

## 2023-03-16 NOTE — Assessment & Plan Note (Signed)
Has gross hematuria, no recent labs for comparison for baseline ? ACD with possible malignancy  Iron studies pending Trend

## 2023-03-17 ENCOUNTER — Encounter (HOSPITAL_COMMUNITY): Payer: Medicare Other | Admitting: Physical Therapy

## 2023-03-17 ENCOUNTER — Other Ambulatory Visit: Payer: Self-pay | Admitting: Urology

## 2023-03-17 DIAGNOSIS — E785 Hyperlipidemia, unspecified: Secondary | ICD-10-CM | POA: Diagnosis not present

## 2023-03-17 DIAGNOSIS — G4733 Obstructive sleep apnea (adult) (pediatric): Secondary | ICD-10-CM | POA: Diagnosis not present

## 2023-03-17 DIAGNOSIS — D649 Anemia, unspecified: Secondary | ICD-10-CM | POA: Diagnosis not present

## 2023-03-17 DIAGNOSIS — I10 Essential (primary) hypertension: Secondary | ICD-10-CM

## 2023-03-17 DIAGNOSIS — Z951 Presence of aortocoronary bypass graft: Secondary | ICD-10-CM

## 2023-03-17 DIAGNOSIS — N2889 Other specified disorders of kidney and ureter: Secondary | ICD-10-CM

## 2023-03-17 DIAGNOSIS — N179 Acute kidney failure, unspecified: Secondary | ICD-10-CM | POA: Diagnosis not present

## 2023-03-17 DIAGNOSIS — R918 Other nonspecific abnormal finding of lung field: Secondary | ICD-10-CM | POA: Diagnosis not present

## 2023-03-17 MED ORDER — SODIUM CHLORIDE 0.9 % IV SOLN
1.0000 g | INTRAVENOUS | Status: DC
  Administered 2023-03-17: 1 g via INTRAVENOUS
  Filled 2023-03-17: qty 10

## 2023-03-17 NOTE — Care Management Obs Status (Signed)
MEDICARE OBSERVATION STATUS NOTIFICATION   Patient Details  Name: Joshua Gentry MRN: [id number] Date of Birth: 1946/08/12   Medicare Observation Status Notification Given:  Yes    Corey Harold 03/17/2023, 4:13 PM

## 2023-03-17 NOTE — Progress Notes (Signed)
   03/17/23 1019  TOC Brief Assessment  Insurance and Status Reviewed  Patient has primary care physician Yes  Home environment has been reviewed Home with spouse  Prior level of function: independent  Prior/Current Home Services No current home services  Social Determinants of Health Reivew SDOH reviewed no interventions necessary  Readmission risk has been reviewed Yes  Transition of care needs no transition of care needs at this time   Patient in OBS for Acute renal failure, TOC following may discharge home later.

## 2023-03-17 NOTE — H&P (View-Only) (Signed)
 Urology Consult  Referring physician: Dr. Gwenlyn Perking Reason for referral: right renal mass  Chief Complaint: Gross hematuria  History of Present Illness: [name] is a 77yo with a history of CAD, OSA, and sarcoma admitted with weakness, elevated creatinine and gross hematuria Since Wednesday he has been having painless gross hematuria. He also noted worsening shortness of breath over the past 2 months. He has a remote tobacco abuse hx and quit 27 years ago. He denies nay unexplained weight loss. Hgb 9.9, creatinine 1.58. He underwent CT chest abd pelvis on admission which showed a 7cm central right renal mass, retroperitoneal lymph nodes and numerous lung nodules concerning for metastatic disease.   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 s/p CABG 1997.   Heart attack (HCC)    Hyperlipidemia    Hypertension    Obesity (BMI 30-39.9)    OSA on CPAP March 2016   Compliant   RBBB with left anterior fascicular block    Past Surgical History:  Procedure Laterality Date   COLONOSCOPY N/A 08/01/2013   Procedure: COLONOSCOPY;  Surgeon: Dalia Heading, MD;  Location: [facility];  Service: Gastroenterology;  Laterality: N/A;   CORONARY ARTERY BYPASS GRAFT     x4   Lens Bilateral April 2016   Replacment    LOWER LEG SOFT TISSUE TUMOR EXCISION      Medications: I have reviewed the patient's current medications. Allergies:  Allergies  Allergen Reactions   Niacin    Penicillins     Patient can't recall reaction- happened a long time ago    Family History  Problem Relation Age of Onset   Hypertension Mother    Diabetes Mother    Heart attack Father    Diabetes Brother    Social History:  reports that he quit smoking about 26 years ago. His smoking use included cigarettes. He started smoking about 58 years ago. He has a 48 pack-year smoking history. He has never used smokeless tobacco. He reports that he does not  currently use alcohol. He reports that he does not use drugs.  Review of Systems  Genitourinary:  Positive for hematuria.  All other systems reviewed and are negative.   Physical Exam:  Vital signs in last 24 hours: Temp:  [97.7 F (36.5 C)-98.7 F (37.1 C)] 98.7 F (37.1 C) (07/31 0903) Pulse Rate:  [62-73] 73 (07/31 0903) Resp:  [15-20] 18 (07/31 0434) BP: (106-128)/(49-95) 128/62 (07/31 0903) SpO2:  [92 %-100 %] 97 % (07/31 0903) Weight:  [116.3 kg-116.5 kg] 116.5 kg (07/31 0500) Physical Exam Vitals reviewed.  Constitutional:      Appearance: Normal appearance.  HENT:     Head: Normocephalic and atraumatic.     Nose: Nose normal.     Mouth/Throat:     Mouth: Mucous membranes are dry.  Eyes:     Extraocular Movements: Extraocular movements intact.     Pupils: Pupils are equal, round, and reactive to light.  Cardiovascular:     Rate and Rhythm: Normal rate and regular rhythm.  Pulmonary:     Effort: Pulmonary effort is normal. No respiratory distress.  Abdominal:     General: Abdomen is flat. There is no distension.  Musculoskeletal:        General: No swelling. Normal range of motion.     Cervical back: Normal range of motion and neck supple.  Skin:  General: Skin is warm and dry.  Neurological:     General: No focal deficit present.     Mental Status: He is alert and oriented to person, place, and time.  Psychiatric:        Mood and Affect: Mood normal.        Behavior: Behavior normal.        Thought Content: Thought content normal.        Judgment: Judgment normal.     Laboratory Data:  Results for orders placed or performed during the hospital encounter of 03/16/23 (from the past 72 hour(s))  Comprehensive metabolic panel     Status: Abnormal   Collection Time: 03/16/23 11:24 AM  Result Value Ref Range   Sodium 131 (L) 135 - 145 mmol/L   Potassium 4.9 3.5 - 5.1 mmol/L   Chloride 102 98 - 111 mmol/L   CO2 21 (L) 22 - 32 mmol/L   Glucose, Bld 115  (H) 70 - 99 mg/dL    Comment: Glucose reference range applies only to samples taken after fasting for at least 8 hours.   BUN 35 (H) 8 - 23 mg/dL   Creatinine, Ser 7.82 (H) 0.61 - 1.24 mg/dL   Calcium 9.8 8.9 - 95.6 mg/dL   Total Protein 6.8 6.5 - 8.1 g/dL   Albumin 3.1 (L) 3.5 - 5.0 g/dL   AST 15 15 - 41 U/L   ALT 13 0 - 44 U/L   Alkaline Phosphatase 74 38 - 126 U/L   Total Bilirubin 0.9 0.3 - 1.2 mg/dL   GFR, Estimated 39 (L) >60 mL/min    Comment: (NOTE) Calculated using the CKD-EPI Creatinine Equation (2021)    Anion gap 8 5 - 15    Comment: Performed at [facility], [address] [place], [place] [address]  Brain natriuretic peptide     Status: None   Collection Time: 03/16/23 11:24 AM  Result Value Ref Range   B Natriuretic Peptide 60.0 0.0 - 100.0 pg/mL    Comment: Performed at [facility], [address], [place], [place] [address]  CBC with Differential     Status: Abnormal   Collection Time: 03/16/23 11:24 AM  Result Value Ref Range   WBC 24.3 (H) 4.0 - 10.5 K/uL   RBC 3.89 (L) 4.22 - 5.81 MIL/uL   Hemoglobin 10.8 (L) 13.0 - 17.0 g/dL   HCT 69.6 (L) 29.5 - 28.4 %   MCV 86.9 80.0 - 100.0 fL   MCH 27.8 26.0 - 34.0 pg   MCHC 32.0 30.0 - 36.0 g/dL   RDW 13.2 44.0 - 10.2 %   Platelets 264 150 - 400 K/uL   nRBC 0.0 0.0 - 0.2 %   Neutrophils Relative % 81 %   Neutro Abs 20.0 (H) 1.7 - 7.7 K/uL   Lymphocytes Relative 8 %   Lymphs Abs 1.9 0.7 - 4.0 K/uL   Monocytes Relative 5 %   Monocytes Absolute 1.3 (H) 0.1 - 1.0 K/uL   Eosinophils Relative 3 %   Eosinophils Absolute 0.7 (H) 0.0 - 0.5 K/uL   Basophils Relative 1 %   Basophils Absolute 0.1 0.0 - 0.1 K/uL   Immature Granulocytes 2 %   Abs Immature Granulocytes 0.41 (H) 0.00 - 0.07 K/uL    Comment: Performed at [facility], [address], [place], [place] [address]  Ferritin     Status: Abnormal   Collection Time: 03/16/23 11:24 AM  Result Value Ref Range   Ferritin 436 (  H) 24 - 336 ng/mL    Comment: Performed  at [facility] [address], [place], [place] [address]  Iron and TIBC     Status: Abnormal   Collection Time: 03/16/23 11:24 AM  Result Value Ref Range   Iron 26 (L) 45 - 182 ug/dL   TIBC 191 (L) 478 - 295 ug/dL   Saturation Ratios 11 (L) 17.9 - 39.5 %   UIBC 208 ug/dL    Comment: Performed at [facility], [address], [place], [place] [address]  Urinalysis, Routine w reflex microscopic -Urine, Clean Catch     Status: Abnormal   Collection Time: 03/16/23  1:01 PM  Result Value Ref Range   Color, Urine RED (A) YELLOW    Comment: BIOCHEMICALS MAY BE AFFECTED BY COLOR   APPearance HAZY (A) CLEAR   Specific Gravity, Urine 1.012 1.005 - 1.030   pH 6.0 5.0 - 8.0   Glucose, UA NEGATIVE NEGATIVE mg/dL   Hgb urine dipstick LARGE (A) NEGATIVE   Bilirubin Urine NEGATIVE NEGATIVE   Ketones, ur NEGATIVE NEGATIVE mg/dL   Protein, ur 865 (A) NEGATIVE mg/dL   Nitrite NEGATIVE NEGATIVE   Leukocytes,Ua TRACE (A) NEGATIVE   RBC / HPF >50 0 - 5 RBC/hpf   WBC, UA 0-5 0 - 5 WBC/hpf   Bacteria, UA NONE SEEN NONE SEEN   Squamous Epithelial / HPF 0-5 0 - 5 /HPF    Comment: Performed at [facility], [address], [place], [place] [address]  Sodium, urine, random     Status: None   Collection Time: 03/16/23  1:01 PM  Result Value Ref Range   Sodium, Ur 59 mmol/L    Comment: Performed at [facility], [address] [place], [place] [address]  Creatinine, urine, random     Status: None   Collection Time: 03/16/23  1:01 PM  Result Value Ref Range   Creatinine, Urine 127 mg/dL    Comment: Performed at [facility], [address], [place], [place] [address]  Culture, blood (Routine X 2) w Reflex to ID Panel     Status: None (Preliminary result)   Collection Time: 03/16/23  5:59 PM   Specimen: BLOOD  Result Value Ref Range   Specimen Description BLOOD BLOOD RIGHT ARM    Special Requests      BOTTLES DRAWN AEROBIC AND ANAEROBIC Blood Culture adequate volume   Culture      NO GROWTH < 24  HOURS Performed at [facility], [address] [place], [place] [address]    Report Status PENDING   CBC     Status: Abnormal   Collection Time: 03/16/23  5:59 PM  Result Value Ref Range   WBC 25.9 (H) 4.0 - 10.5 K/uL   RBC 3.78 (L) 4.22 - 5.81 MIL/uL   Hemoglobin 10.7 (L) 13.0 - 17.0 g/dL   HCT 27.2 (L) 53.6 - 64.4 %   MCV 89.7 80.0 - 100.0 fL   MCH 28.3 26.0 - 34.0 pg   MCHC 31.6 30.0 - 36.0 g/dL   RDW 03.4 74.2 - 59.5 %   Platelets 277 150 - 400 K/uL   nRBC 0.0 0.0 - 0.2 %    Comment: Performed at [facility], [address], [place], [place] [address]  Technologist smear review     Status: None   Collection Time: 03/16/23  5:59 PM  Result Value Ref Range   WBC MORPHOLOGY Mild Left Shift (1-5% metas, occ myelo)    RBC MORPHOLOGY  MORPHOLOGY UNREMARKABLE    Plt Morphology Normal platelet morphology    Clinical Information leukocytosis in setting of maligancy     Comment: Performed at [facility], [address], [place], [place] [address]  Differential     Status: Abnormal   Collection Time: 03/16/23  5:59 PM  Result Value Ref Range   Neutrophils Relative % 81 %   Neutro Abs 21.2 (H) 1.7 - 7.7 K/uL   Lymphocytes Relative 9 %   Lymphs Abs 2.4 0.7 - 4.0 K/uL   Monocytes Relative 5 %   Monocytes Absolute 1.2 (H) 0.1 - 1.0 K/uL   Eosinophils Relative 3 %   Eosinophils Absolute 0.9 (H) 0.0 - 0.5 K/uL   Basophils Relative 0 %   Basophils Absolute 0.1 0.0 - 0.1 K/uL   WBC Morphology Mild Left Shift (1-5% metas, occ myelo)    RBC Morphology MORPHOLOGY UNREMARKABLE    Smear Review Normal platelet morphology    nRBC 0 0 /100 WBC    Comment: Performed at [facility], [address] [place], [place] [address]  Culture, blood (Routine X 2) w Reflex to ID Panel     Status: None (Preliminary result)   Collection Time: 03/16/23  6:03 PM   Specimen: BLOOD  Result Value Ref Range   Specimen Description BLOOD BLOOD LEFT ARM    Special Requests      BOTTLES DRAWN AEROBIC AND  ANAEROBIC Blood Culture adequate volume   Culture      NO GROWTH < 24 HOURS Performed at [facility], [address], [place], [place] [address]    Report Status PENDING   Basic metabolic panel     Status: Abnormal   Collection Time: 03/17/23  4:55 AM  Result Value Ref Range   Sodium 134 (L) 135 - 145 mmol/L   Potassium 4.7 3.5 - 5.1 mmol/L   Chloride 105 98 - 111 mmol/L   CO2 22 22 - 32 mmol/L   Glucose, Bld 98 70 - 99 mg/dL    Comment: Glucose reference range applies only to samples taken after fasting for at least 8 hours.   BUN 31 (H) 8 - 23 mg/dL   Creatinine, Ser 9.14 (H) 0.61 - 1.24 mg/dL   Calcium 9.5 8.9 - 78.2 mg/dL   GFR, Estimated 45 (L) >60 mL/min    Comment: (NOTE) Calculated using the CKD-EPI Creatinine Equation (2021)    Anion gap 7 5 - 15    Comment: Performed at [facility], [address] [place], [place] [address]  CBC     Status: Abnormal   Collection Time: 03/17/23  4:55 AM  Result Value Ref Range   WBC 21.4 (H) 4.0 - 10.5 K/uL   RBC 3.46 (L) 4.22 - 5.81 MIL/uL   Hemoglobin 9.9 (L) 13.0 - 17.0 g/dL   HCT 30.8 (L) 65.7 - 84.6 %   MCV 89.0 80.0 - 100.0 fL   MCH 28.6 26.0 - 34.0 pg   MCHC 32.1 30.0 - 36.0 g/dL   RDW 96.2 95.2 - 84.1 %   Platelets 257 150 - 400 K/uL   nRBC 0.0 0.0 - 0.2 %    Comment: Performed at [facility], [address] [place], [place] [address]   Recent Results (from the past 240 hour(s))  Culture, blood (Routine X 2) w Reflex to ID Panel     Status: None (Preliminary result)   Collection Time: 03/16/23  5:59 PM   Specimen: BLOOD  Result Value Ref Range  Status   Specimen Description BLOOD BLOOD RIGHT ARM  Final   Special Requests   Final    BOTTLES DRAWN AEROBIC AND ANAEROBIC Blood Culture adequate volume   Culture   Final    NO GROWTH < 24 HOURS Performed at [facility], [address], [place], [place] [address]    Report Status PENDING  Incomplete  Culture, blood (Routine X 2) w Reflex to ID Panel     Status: None  (Preliminary result)   Collection Time: 03/16/23  6:03 PM   Specimen: BLOOD  Result Value Ref Range Status   Specimen Description BLOOD BLOOD LEFT ARM  Final   Special Requests   Final    BOTTLES DRAWN AEROBIC AND ANAEROBIC Blood Culture adequate volume   Culture   Final    NO GROWTH < 24 HOURS Performed at [facility], [address], [place], [place] [address]    Report Status PENDING  Incomplete   Creatinine: Recent Labs    03/16/23 1124 03/17/23 0455  CREATININE 1.81* 1.58*   Baseline Creatinine: unknown  Impression/Assessment:  77yo with right renal mass, likely metastatic disease and gross hematuria  Plan:  We discussed the natural hx of renal masses and the likelihood of malignancy given lymphadenopathy and lung masses. We discussed the natural hx of RCC versus TCC and the survival rates from surgery alone versus surgery plus chemotherapy. We disucssed the treatment options including active surveillance and radical nephrectomy. After discussing the options the patient elects for right radical nephrectomy which will be scheduled as an outpatient. The patient does not need a biopsy to confirm malignancy.   Wilkie Aye 03/17/2023, 12:46 PM

## 2023-03-17 NOTE — Consult Note (Signed)
Urology Consult  Referring physician: Dr. Gwenlyn Perking Reason for referral: right renal mass  Chief Complaint: Gross hematuria  History of Present Illness: [name] is a 77yo with a history of CAD, OSA, and sarcoma admitted with weakness, elevated creatinine and gross hematuria Since Wednesday he has been having painless gross hematuria. He also noted worsening shortness of breath over the past 2 months. He has a remote tobacco abuse hx and quit 27 years ago. He denies nay unexplained weight loss. Hgb 9.9, creatinine 1.58. He underwent CT chest abd pelvis on admission which showed a 7cm central right renal mass, retroperitoneal lymph nodes and numerous lung nodules concerning for metastatic disease.   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 s/p CABG 1997.   Heart attack (HCC)    Hyperlipidemia    Hypertension    Obesity (BMI 30-39.9)    OSA on CPAP March 2016   Compliant   RBBB with left anterior fascicular block    Past Surgical History:  Procedure Laterality Date   COLONOSCOPY N/A 08/01/2013   Procedure: COLONOSCOPY;  Surgeon: Dalia Heading, MD;  Location: [facility];  Service: Gastroenterology;  Laterality: N/A;   CORONARY ARTERY BYPASS GRAFT     x4   Lens Bilateral April 2016   Replacment    LOWER LEG SOFT TISSUE TUMOR EXCISION      Medications: I have reviewed the patient's current medications. Allergies:  Allergies  Allergen Reactions   Niacin    Penicillins     Patient can't recall reaction- happened a long time ago    Family History  Problem Relation Age of Onset   Hypertension Mother    Diabetes Mother    Heart attack Father    Diabetes Brother    Social History:  reports that he quit smoking about 26 years ago. His smoking use included cigarettes. He started smoking about 58 years ago. He has a 48 pack-year smoking history. He has never used smokeless tobacco. He reports that he does not  currently use alcohol. He reports that he does not use drugs.  Review of Systems  Genitourinary:  Positive for hematuria.  All other systems reviewed and are negative.   Physical Exam:  Vital signs in last 24 hours: Temp:  [97.7 F (36.5 C)-98.7 F (37.1 C)] 98.7 F (37.1 C) (07/31 0903) Pulse Rate:  [62-73] 73 (07/31 0903) Resp:  [15-20] 18 (07/31 0434) BP: (106-128)/(49-95) 128/62 (07/31 0903) SpO2:  [92 %-100 %] 97 % (07/31 0903) Weight:  [116.3 kg-116.5 kg] 116.5 kg (07/31 0500) Physical Exam Vitals reviewed.  Constitutional:      Appearance: Normal appearance.  HENT:     Head: Normocephalic and atraumatic.     Nose: Nose normal.     Mouth/Throat:     Mouth: Mucous membranes are dry.  Eyes:     Extraocular Movements: Extraocular movements intact.     Pupils: Pupils are equal, round, and reactive to light.  Cardiovascular:     Rate and Rhythm: Normal rate and regular rhythm.  Pulmonary:     Effort: Pulmonary effort is normal. No respiratory distress.  Abdominal:     General: Abdomen is flat. There is no distension.  Musculoskeletal:        General: No swelling. Normal range of motion.     Cervical back: Normal range of motion and neck supple.  Skin:  General: Skin is warm and dry.  Neurological:     General: No focal deficit present.     Mental Status: He is alert and oriented to person, place, and time.  Psychiatric:        Mood and Affect: Mood normal.        Behavior: Behavior normal.        Thought Content: Thought content normal.        Judgment: Judgment normal.     Laboratory Data:  Results for orders placed or performed during the hospital encounter of 03/16/23 (from the past 72 hour(s))  Comprehensive metabolic panel     Status: Abnormal   Collection Time: 03/16/23 11:24 AM  Result Value Ref Range   Sodium 131 (L) 135 - 145 mmol/L   Potassium 4.9 3.5 - 5.1 mmol/L   Chloride 102 98 - 111 mmol/L   CO2 21 (L) 22 - 32 mmol/L   Glucose, Bld 115  (H) 70 - 99 mg/dL    Comment: Glucose reference range applies only to samples taken after fasting for at least 8 hours.   BUN 35 (H) 8 - 23 mg/dL   Creatinine, Ser 7.82 (H) 0.61 - 1.24 mg/dL   Calcium 9.8 8.9 - 95.6 mg/dL   Total Protein 6.8 6.5 - 8.1 g/dL   Albumin 3.1 (L) 3.5 - 5.0 g/dL   AST 15 15 - 41 U/L   ALT 13 0 - 44 U/L   Alkaline Phosphatase 74 38 - 126 U/L   Total Bilirubin 0.9 0.3 - 1.2 mg/dL   GFR, Estimated 39 (L) >60 mL/min    Comment: (NOTE) Calculated using the CKD-EPI Creatinine Equation (2021)    Anion gap 8 5 - 15    Comment: Performed at [facility], [address] [place], [place] [address]  Brain natriuretic peptide     Status: None   Collection Time: 03/16/23 11:24 AM  Result Value Ref Range   B Natriuretic Peptide 60.0 0.0 - 100.0 pg/mL    Comment: Performed at [facility], [address], [place], [place] [address]  CBC with Differential     Status: Abnormal   Collection Time: 03/16/23 11:24 AM  Result Value Ref Range   WBC 24.3 (H) 4.0 - 10.5 K/uL   RBC 3.89 (L) 4.22 - 5.81 MIL/uL   Hemoglobin 10.8 (L) 13.0 - 17.0 g/dL   HCT 69.6 (L) 29.5 - 28.4 %   MCV 86.9 80.0 - 100.0 fL   MCH 27.8 26.0 - 34.0 pg   MCHC 32.0 30.0 - 36.0 g/dL   RDW 13.2 44.0 - 10.2 %   Platelets 264 150 - 400 K/uL   nRBC 0.0 0.0 - 0.2 %   Neutrophils Relative % 81 %   Neutro Abs 20.0 (H) 1.7 - 7.7 K/uL   Lymphocytes Relative 8 %   Lymphs Abs 1.9 0.7 - 4.0 K/uL   Monocytes Relative 5 %   Monocytes Absolute 1.3 (H) 0.1 - 1.0 K/uL   Eosinophils Relative 3 %   Eosinophils Absolute 0.7 (H) 0.0 - 0.5 K/uL   Basophils Relative 1 %   Basophils Absolute 0.1 0.0 - 0.1 K/uL   Immature Granulocytes 2 %   Abs Immature Granulocytes 0.41 (H) 0.00 - 0.07 K/uL    Comment: Performed at [facility], [address], [place], [place] [address]  Ferritin     Status: Abnormal   Collection Time: 03/16/23 11:24 AM  Result Value Ref Range   Ferritin 436 (  H) 24 - 336 ng/mL    Comment: Performed  at [facility] [address], [place], [place] [address]  Iron and TIBC     Status: Abnormal   Collection Time: 03/16/23 11:24 AM  Result Value Ref Range   Iron 26 (L) 45 - 182 ug/dL   TIBC 191 (L) 478 - 295 ug/dL   Saturation Ratios 11 (L) 17.9 - 39.5 %   UIBC 208 ug/dL    Comment: Performed at [facility], [address], [place], [place] [address]  Urinalysis, Routine w reflex microscopic -Urine, Clean Catch     Status: Abnormal   Collection Time: 03/16/23  1:01 PM  Result Value Ref Range   Color, Urine RED (A) YELLOW    Comment: BIOCHEMICALS MAY BE AFFECTED BY COLOR   APPearance HAZY (A) CLEAR   Specific Gravity, Urine 1.012 1.005 - 1.030   pH 6.0 5.0 - 8.0   Glucose, UA NEGATIVE NEGATIVE mg/dL   Hgb urine dipstick LARGE (A) NEGATIVE   Bilirubin Urine NEGATIVE NEGATIVE   Ketones, ur NEGATIVE NEGATIVE mg/dL   Protein, ur 865 (A) NEGATIVE mg/dL   Nitrite NEGATIVE NEGATIVE   Leukocytes,Ua TRACE (A) NEGATIVE   RBC / HPF >50 0 - 5 RBC/hpf   WBC, UA 0-5 0 - 5 WBC/hpf   Bacteria, UA NONE SEEN NONE SEEN   Squamous Epithelial / HPF 0-5 0 - 5 /HPF    Comment: Performed at [facility], [address], [place], [place] [address]  Sodium, urine, random     Status: None   Collection Time: 03/16/23  1:01 PM  Result Value Ref Range   Sodium, Ur 59 mmol/L    Comment: Performed at [facility], [address] [place], [place] [address]  Creatinine, urine, random     Status: None   Collection Time: 03/16/23  1:01 PM  Result Value Ref Range   Creatinine, Urine 127 mg/dL    Comment: Performed at [facility], [address], [place], [place] [address]  Culture, blood (Routine X 2) w Reflex to ID Panel     Status: None (Preliminary result)   Collection Time: 03/16/23  5:59 PM   Specimen: BLOOD  Result Value Ref Range   Specimen Description BLOOD BLOOD RIGHT ARM    Special Requests      BOTTLES DRAWN AEROBIC AND ANAEROBIC Blood Culture adequate volume   Culture      NO GROWTH < 24  HOURS Performed at [facility], [address] [place], [place] [address]    Report Status PENDING   CBC     Status: Abnormal   Collection Time: 03/16/23  5:59 PM  Result Value Ref Range   WBC 25.9 (H) 4.0 - 10.5 K/uL   RBC 3.78 (L) 4.22 - 5.81 MIL/uL   Hemoglobin 10.7 (L) 13.0 - 17.0 g/dL   HCT 27.2 (L) 53.6 - 64.4 %   MCV 89.7 80.0 - 100.0 fL   MCH 28.3 26.0 - 34.0 pg   MCHC 31.6 30.0 - 36.0 g/dL   RDW 03.4 74.2 - 59.5 %   Platelets 277 150 - 400 K/uL   nRBC 0.0 0.0 - 0.2 %    Comment: Performed at [facility], [address], [place], [place] [address]  Technologist smear review     Status: None   Collection Time: 03/16/23  5:59 PM  Result Value Ref Range   WBC MORPHOLOGY Mild Left Shift (1-5% metas, occ myelo)    RBC MORPHOLOGY  MORPHOLOGY UNREMARKABLE    Plt Morphology Normal platelet morphology    Clinical Information leukocytosis in setting of maligancy     Comment: Performed at [facility], [address], [place], [place] [address]  Differential     Status: Abnormal   Collection Time: 03/16/23  5:59 PM  Result Value Ref Range   Neutrophils Relative % 81 %   Neutro Abs 21.2 (H) 1.7 - 7.7 K/uL   Lymphocytes Relative 9 %   Lymphs Abs 2.4 0.7 - 4.0 K/uL   Monocytes Relative 5 %   Monocytes Absolute 1.2 (H) 0.1 - 1.0 K/uL   Eosinophils Relative 3 %   Eosinophils Absolute 0.9 (H) 0.0 - 0.5 K/uL   Basophils Relative 0 %   Basophils Absolute 0.1 0.0 - 0.1 K/uL   WBC Morphology Mild Left Shift (1-5% metas, occ myelo)    RBC Morphology MORPHOLOGY UNREMARKABLE    Smear Review Normal platelet morphology    nRBC 0 0 /100 WBC    Comment: Performed at [facility], [address] [place], [place] [address]  Culture, blood (Routine X 2) w Reflex to ID Panel     Status: None (Preliminary result)   Collection Time: 03/16/23  6:03 PM   Specimen: BLOOD  Result Value Ref Range   Specimen Description BLOOD BLOOD LEFT ARM    Special Requests      BOTTLES DRAWN AEROBIC AND  ANAEROBIC Blood Culture adequate volume   Culture      NO GROWTH < 24 HOURS Performed at [facility], [address], [place], [place] [address]    Report Status PENDING   Basic metabolic panel     Status: Abnormal   Collection Time: 03/17/23  4:55 AM  Result Value Ref Range   Sodium 134 (L) 135 - 145 mmol/L   Potassium 4.7 3.5 - 5.1 mmol/L   Chloride 105 98 - 111 mmol/L   CO2 22 22 - 32 mmol/L   Glucose, Bld 98 70 - 99 mg/dL    Comment: Glucose reference range applies only to samples taken after fasting for at least 8 hours.   BUN 31 (H) 8 - 23 mg/dL   Creatinine, Ser 9.14 (H) 0.61 - 1.24 mg/dL   Calcium 9.5 8.9 - 78.2 mg/dL   GFR, Estimated 45 (L) >60 mL/min    Comment: (NOTE) Calculated using the CKD-EPI Creatinine Equation (2021)    Anion gap 7 5 - 15    Comment: Performed at [facility], [address] [place], [place] [address]  CBC     Status: Abnormal   Collection Time: 03/17/23  4:55 AM  Result Value Ref Range   WBC 21.4 (H) 4.0 - 10.5 K/uL   RBC 3.46 (L) 4.22 - 5.81 MIL/uL   Hemoglobin 9.9 (L) 13.0 - 17.0 g/dL   HCT 30.8 (L) 65.7 - 84.6 %   MCV 89.0 80.0 - 100.0 fL   MCH 28.6 26.0 - 34.0 pg   MCHC 32.1 30.0 - 36.0 g/dL   RDW 96.2 95.2 - 84.1 %   Platelets 257 150 - 400 K/uL   nRBC 0.0 0.0 - 0.2 %    Comment: Performed at [facility], [address] [place], [place] [address]   Recent Results (from the past 240 hour(s))  Culture, blood (Routine X 2) w Reflex to ID Panel     Status: None (Preliminary result)   Collection Time: 03/16/23  5:59 PM   Specimen: BLOOD  Result Value Ref Range  Status   Specimen Description BLOOD BLOOD RIGHT ARM  Final   Special Requests   Final    BOTTLES DRAWN AEROBIC AND ANAEROBIC Blood Culture adequate volume   Culture   Final    NO GROWTH < 24 HOURS Performed at [facility], [address], [place], [place] [address]    Report Status PENDING  Incomplete  Culture, blood (Routine X 2) w Reflex to ID Panel     Status: None  (Preliminary result)   Collection Time: 03/16/23  6:03 PM   Specimen: BLOOD  Result Value Ref Range Status   Specimen Description BLOOD BLOOD LEFT ARM  Final   Special Requests   Final    BOTTLES DRAWN AEROBIC AND ANAEROBIC Blood Culture adequate volume   Culture   Final    NO GROWTH < 24 HOURS Performed at [facility], [address], [place], [place] [address]    Report Status PENDING  Incomplete   Creatinine: Recent Labs    03/16/23 1124 03/17/23 0455  CREATININE 1.81* 1.58*   Baseline Creatinine: unknown  Impression/Assessment:  77yo with right renal mass, likely metastatic disease and gross hematuria  Plan:  We discussed the natural hx of renal masses and the likelihood of malignancy given lymphadenopathy and lung masses. We discussed the natural hx of RCC versus TCC and the survival rates from surgery alone versus surgery plus chemotherapy. We disucssed the treatment options including active surveillance and radical nephrectomy. After discussing the options the patient elects for right radical nephrectomy which will be scheduled as an outpatient. The patient does not need a biopsy to confirm malignancy.   Joshua Gentry 03/17/2023, 12:46 PM

## 2023-03-17 NOTE — Progress Notes (Signed)
Progress Note   Patient: Joshua Gentry QQV:[id number] DOB: 1946/05/02 DOA: 03/16/2023     0 DOS: the patient was seen and examined on 03/17/2023   Brief hospital admission narrative course: ARLENE IVERS is a 77 y.o. male with medical history significant of remote hx of right leg sarcoma, CAD s/p CABG in 1997, HTN, HLD, OSA on cpap who presented to ED with complaints of gross hematuria.  He has been coughing x 6 months every morning. He went to see his PCP yesterday AM due to blood in his urine that started last Wednesday. He states his blood is dark red every time he urinates. No pain at all. He has no urgency or frequency. He has no acute CVA tenderness.  States he has had lower back pain for years which has gotten worse. He received a steroid shot for this about 6 weeks ago and helped the pain; however, he states pain came back. No leg weakness. He is on aspirin only.    He has decreased appetite x 5 days. He has been drinking water well. No N/V/D.    Denies any fever/chills, vision changes/headaches, chest pain or palpitations, shortness of breath, abdominal pain, N/V/D, dysuria or leg swelling.      He does not smoke or drink alcohol.    ER Course:  vitals: afebrile, bp: 107/56, HR: 63, RR: 20, oxygen: 97%on 2L [place] Pertinent labs: wbc: 24.3, hgb: 10.8, sodium: 131, BUN: 35, creatinine: 1.81,  CXR: Interval development of basilar predominant patchy and nodular subpleural and airspace disease in both lung bases. Imaging features could be related to multifocal pneumonia but given nodular character in some regions, chest CT recommended to further evaluate. CT chest: numerous lung nodules identified worrisome for metastatic disease.  Possible mass lesion involving the right kidney. Gallstones. Aortic atherosclerosis and emphysema.  CT abdomen/pelvis: There is a 6.9 cm heterogeneous lesion centered in the right kidney upper pole with protrusion into the sinus fat. This is highly concerning  for renal neoplasm. There are few adjacent retrocaval lymph nodes noted with short axis less than 6 mm. Renal vein is patent. 2. Innumerable nodules in the visualized bilateral lungs, compatible with metastasis. Please refer to same-day performed CT scan chest report for details. 3. Several subcentimeter sized sclerotic foci in the T11 vertebral body, L2 vertebral body and L1 spinous process, which are indeterminate. No pathological fracture. 4. Multiple other nonacute observations, as described above. In ED: Urology was consulted. Given 1L IVF bolus and TRH asked to admit.   Assessment and Plan: * Acute renal failure (ARF) (HCC) -77 year old presenting to ED from his PCP for abnormal labs and gross hematuria and AKI found to have new metastatic disease. -Continue to have hematuria; concern for overlapping UTI present at the moment -Continue to follow cultures -Continue empirical management with Rocephin -CT scan suggesting the presence of RCC; urology service has been consulted and after discussing with family they will plan for outpatient right nephrectomy. -Patient will establish care with oncology service in order to receive adjuvant chemotherapy. -Continue fluid resuscitation and follow renal function trend. -Continue to minimize the use of nephrotoxic agents; avoid hypotension and the use of contrast.  Leukocytosis/SIRS -He has no fever/chills or symptoms suggestive of infection except increased frequency and positive hematuria -Continue empirical use of Rocephin -Continue to maintain adequate hydration -Continue to follow WBCs trend -Incentive spirometer has been ordered.  Renal mass, right -6.9cm heterogeneous lesion centered in the right kidney upper pole with protrusion  into the left sinus fat. Highly concerning for renal neoplasm.  -Patient with positive hematuria -Case discussed with urology service who will make arrangement for right nephrectomy in the coming  weeks.  Multiple lung nodules concerning for metastatic disesae -With concerns for renal cell carcinoma with lung metastasis -Outpatient oncology follow-up for PET scan and most likely adjuvant chemotherapy after nephrectomy.   Normocytic anemia -Has gross hematuria, no recent labs for comparison for baseline -Anemia of chronic disease along with acute blood loss component from hematuria appreciated -No transfusions required at the moment -Follow hemoglobin trend.  Essential hypertension -Continue holding antihypertensive agents -Maintain adequate hydration -Heart healthy diet discussed with patient.   Hx of CABG -Continue medical management  -Continue holding aspirin at the moment with ongoing hematuria. -Patient denies chest pain. -Continue outpatient follow-up with cardiology service.  Dyslipidemia, goal LDL below 70 -Continue crestor 20mg  daily  -Heart healthy diet discussed with patient.  OSA on CPAP -Continue cpap at night    Subjective:  Afebrile, no chest pain, no nausea, no vomiting.  Complaining of ongoing hematuria.  Physical Exam: Vitals:   03/17/23 0434 03/17/23 0500 03/17/23 0903 03/17/23 1342  BP: 108/66  128/62 (!) 109/58  Pulse: 73  73 71  Resp: 18   19  Temp: 97.7 F (36.5 C)  98.7 F (37.1 C) 97.6 F (36.4 C)  TempSrc: Oral  Oral Oral  SpO2: 95%  97% 98%  Weight:  116.5 kg    Height:       General exam: Alert, awake, oriented x 3; in no acute distress; expressing no nausea, no vomiting, no chest pain or shortness of breath currently.  Tolerating diet.  Ongoing hematuria reported. Respiratory system: Positive scattered rhonchi; no wheezing, no using accessory muscles.  Good saturation on room air. Cardiovascular system:RRR. No rub or gallop; no JVD. Gastrointestinal system: Abdomen is obese, nondistended, soft and nontender. No organomegaly or masses felt. Normal bowel sounds heard. Central nervous system: Alert and oriented. No focal  neurological deficits. Extremities: No cyanosis or clubbing. Skin: No petechiae. Psychiatry: Judgement and insight appear normal. Mood & affect appropriate.   Data Reviewed: Basic metabolic panel: Sodium 134, potassium 4.7, chloride 105, bicarb 22, glucose 98, BUN 31, creatinine 1.58 and GFR 45 CBC: WBCs 21.4, hemoglobin 9.9, platelet count 257 K  Family Communication: Wife at bedside.  Disposition: Status is: Observation The patient remains OBS appropriate and will d/c before 2 midnights.  Planned Discharge Destination: Home   Time spent: 50 minutes  Author: Vassie Loll, MD 03/17/2023 6:55 PM  For on call review [email or web address].

## 2023-03-17 NOTE — Progress Notes (Signed)
Patient using home CPAP unit independently. Told patient to call if he needed anything.

## 2023-03-18 ENCOUNTER — Telehealth: Payer: Self-pay | Admitting: *Deleted

## 2023-03-18 DIAGNOSIS — I10 Essential (primary) hypertension: Secondary | ICD-10-CM | POA: Diagnosis not present

## 2023-03-18 DIAGNOSIS — N179 Acute kidney failure, unspecified: Secondary | ICD-10-CM | POA: Diagnosis not present

## 2023-03-18 DIAGNOSIS — Z951 Presence of aortocoronary bypass graft: Secondary | ICD-10-CM | POA: Diagnosis not present

## 2023-03-18 DIAGNOSIS — G4733 Obstructive sleep apnea (adult) (pediatric): Secondary | ICD-10-CM | POA: Diagnosis not present

## 2023-03-18 DIAGNOSIS — R310 Gross hematuria: Secondary | ICD-10-CM

## 2023-03-18 DIAGNOSIS — R918 Other nonspecific abnormal finding of lung field: Secondary | ICD-10-CM | POA: Diagnosis not present

## 2023-03-18 DIAGNOSIS — N2889 Other specified disorders of kidney and ureter: Secondary | ICD-10-CM | POA: Diagnosis not present

## 2023-03-18 DIAGNOSIS — E785 Hyperlipidemia, unspecified: Secondary | ICD-10-CM | POA: Diagnosis not present

## 2023-03-18 MED ORDER — CEFPODOXIME PROXETIL 200 MG PO TABS: 200.0000 mg | ORAL_TABLET | Freq: Two times a day (BID) | ORAL | 0 refills | Status: AC

## 2023-03-18 NOTE — Discharge Summary (Signed)
Physician Discharge Summary   Patient: Joshua Gentry MRN: [id number] DOB: April 24, 1946  Admit date:     03/16/2023  Discharge date: 03/18/23  Discharge Physician: Vassie Loll   PCP: Assunta Found, MD   Recommendations at discharge:  Repeat CBC to follow hemoglobin trend/stability Repeat basic metabolic panel to follow renal function and electrolytes. Reassess blood pressure and adjust antihypertensive treatment as required Make sure patient has follow-up with urology service and oncology as instructed  Discharge Diagnoses: Principal Problem:   Acute renal failure (ARF) (HCC) Active Problems:   Leukocytosis/SIRS   Renal mass, right   Multiple lung nodules concerning for metastatic disesae   Essential hypertension   Normocytic anemia   Hx of CABG   Dyslipidemia, goal LDL below 70   OSA on CPAP   Gross hematuria  Brief Hospital admission narrative course: Joshua Gentry is a 77 y.o. male with medical history significant of remote hx of right leg sarcoma, CAD s/p CABG in 1997, HTN, HLD, OSA on cpap who presented to ED with complaints of gross hematuria.  He has been coughing x 6 months every morning. He went to see his PCP yesterday AM due to blood in his urine that started last Wednesday. He states his blood is dark red every time he urinates. No pain at all. He has no urgency or frequency. He has no acute CVA tenderness.  States he has had lower back pain for years which has gotten worse. He received a steroid shot for this about 6 weeks ago and helped the pain; however, he states pain came back. No leg weakness. He is on aspirin only.    He has decreased appetite x 5 days. He has been drinking water well. No N/V/D.    Denies any fever/chills, vision changes/headaches, chest pain or palpitations, shortness of breath, abdominal pain, N/V/D, dysuria or leg swelling.      He does not smoke or drink alcohol.   Assessment and Plan: * Acute renal failure (ARF) (HCC) -77 year old  presenting to ED from his PCP for abnormal labs and gross hematuria and AKI found to have new metastatic disease. -Continue to have hematuria; concern for overlapping UTI present at the current moment -Follow final culture results; currently expressing no dysuria, no fever, no nausea or vomiting. -Patient will pleat 7 days total of antibiotics using oral cefpodoxime at time of discharge. -Advised to maintain adequate hydration -Renal function improved/close to baseline at time of discharge. -Continue minimizing nephrotoxic agents and follow creatinine trend. -CT scan suggesting the presence of metastatic RCC; urology service has been consulted and after discussing with family they will plan for outpatient right nephrectomy; tentatively scheduled for 04/05/2023. -Patient will establish care with oncology service in order to complete staging as needed and pursued adjuvant chemotherapy. -Continue fluid resuscitation and follow renal function trend. -Avoid hypotension and the use of contrast.  Leukocytosis/SIRS -He has no fever/chills or symptoms suggestive of infection except increased frequency and positive hematuria. -Complete a total of 7 days empirical oral antibiotics using third-generation cephalosporin at time of discharge. -Patient advised to maintain adequate hydration -Continue to follow WBC trend with repeat CBC at follow-up visit.  Renal mass, right -6.9 cm heterogeneous lesion centered in the right kidney upper pole with protrusion into the left sinus fat. Highly concerning for renal neoplasm.  -Patient with positive hematuria -Case discussed with urology service who will make arrangement for right nephrectomy in the coming 2 weeks or so. -Outpatient follow-up with oncology service  will be arranged.  Multiple lung nodules concerning for metastatic disesae -With concerns for renal cell carcinoma with lung metastasis -Outpatient oncology follow-up for PET scan and most likely  adjuvant chemotherapy after nephrectomy.    Normocytic anemia -Has gross hematuria, no recent labs for comparison for baseline -Anemia of chronic disease along with acute blood loss component from hematuria appreciated -No transfusions required at the moment -continue to Follow hemoglobin trend. -Hgb 9.5 at discharge  Essential hypertension -Continue to closely follow vital signs-obesity -Holding agents that can Further affect renal function -Maintain adequate hydration -Heart healthy diet discussed with patient.   Hx of CABG -Continue medical management  -Continue holding aspirin at the moment with ongoing hematuria. -Patient denies chest pain. -Continue outpatient follow-up with cardiology service.  Dyslipidemia, goal LDL below 70 -Continue crestor 20mg  daily  -Heart healthy diet discussed with patient.  OSA on CPAP -Continue cpap at night    Consultants: Urology service; oncology curbside. Procedures performed: See below for x-ray reports. Disposition: Home Diet recommendation: Heart healthy diet.  DISCHARGE MEDICATION: Allergies as of 03/18/2023       Reactions   Niacin    Penicillins    Patient can't recall reaction- happened a long time ago        Medication List     STOP taking these medications    amLODipine 10 MG tablet Commonly known as: NORVASC   aspirin 81 MG tablet   Co Q 10 100 MG Caps   hydrochlorothiazide 25 MG tablet Commonly known as: HYDRODIURIL   losartan 100 MG tablet Commonly known as: COZAAR   moxifloxacin 400 MG tablet Commonly known as: AVELOX   potassium chloride SA 20 MEQ tablet Commonly known as: KLOR-CON M   spironolactone 25 MG tablet Commonly known as: ALDACTONE       TAKE these medications    calcium-vitamin D 250-100 MG-UNIT tablet Take 1 tablet by mouth 2 (two) times daily.   cefpodoxime 200 MG tablet Commonly known as: VANTIN Take 1 tablet (200 mg total) by mouth 2 (two) times daily for 7 days.    cholecalciferol 1000 units tablet Commonly known as: VITAMIN D Take 1,000 Units by mouth daily.   metoprolol succinate 100 MG 24 hr tablet Commonly known as: Toprol XL Take 1 tablet (100 mg total) by mouth daily. Take with or immediately following a meal.   multivitamin with minerals tablet Take 1 tablet by mouth daily.   nitroGLYCERIN 0.4 MG SL tablet Commonly known as: NITROSTAT DISSOLVE 1 TABLET UNDER THE TONGUE EVERY 5 MINUTES AS NEEDED FOR CHEST PAIN, DO NOT EXCEED A TOTAL OF 3 DOSES IN 15 MINUTES   rosuvastatin 20 MG tablet Commonly known as: CRESTOR TAKE 1 TABLET(20 MG) BY MOUTH DAILY What changed: See the new instructions.        Follow-up Information     Assunta Found, MD. Schedule an appointment as soon as possible for a visit in 10 day(s).   Specialty: Family Medicine Contact information: [address] [place] [address] [phone]                Discharge Exam: Filed Weights   03/16/23 1009 03/16/23 2027 03/17/23 0500  Weight: 117.9 kg 116.3 kg 116.5 kg   General exam: Alert, awake, oriented x 3; in no acute distress; expressing no nausea, no vomiting, no chest pain or shortness of breath currently.  Tolerating diet.  Ongoing hematuria reported. Respiratory system: Positive scattered rhonchi; no wheezing, no using accessory muscles.  Good  saturation on room air. Cardiovascular system:RRR. No rub or gallop; no JVD. Gastrointestinal system: Abdomen is obese, nondistended, soft and nontender. No organomegaly or masses felt. Normal bowel sounds heard. Central nervous system: Alert and oriented. No focal neurological deficits. Extremities: No cyanosis or clubbing. Skin: No petechiae. Psychiatry: Judgement and insight appear normal. Mood & affect appropriate.   Condition at discharge: stable  The results of significant diagnostics from this hospitalization (including imaging, microbiology, ancillary and laboratory) are listed below for  reference.   Imaging Studies: CT ABDOMEN PELVIS W CONTRAST  Result Date: 03/16/2023 CLINICAL DATA:  Abdominal pain, acute, nonlocalized EXAM: CT ABDOMEN AND PELVIS WITH CONTRAST TECHNIQUE: Multidetector CT imaging of the abdomen and pelvis was performed using the standard protocol following bolus administration of intravenous contrast. RADIATION DOSE REDUCTION: This exam was performed according to the departmental dose-optimization program which includes automated exposure control, adjustment of the mA and/or kV according to patient size and/or use of iterative reconstruction technique. CONTRAST:  80mL OMNIPAQUE IOHEXOL 300 MG/ML  SOLN COMPARISON:  None Available. FINDINGS: Lower chest: There are innumerable nodules in the visualized bilateral lungs, compatible with metastasis. Please refer to separately dictated CT scan chest performed earlier the same day. The heart is normal in size. No pericardial effusion. Hepatobiliary: The liver is normal in size. Non-cirrhotic configuration. There are multiple hypoattenuating structures in the liver with largest in the left hepatic dome measuring up to 3.2 x 4.3 cm, which can be characterized as a simple cyst. There are multiple subcentimeter hypoattenuating foci throughout liver, which are too small to adequately characterize but favored to represent cysts as well. They can be better characterized with contrast-enhanced MRI abdomen, if clinically indicated. No intrahepatic or extrahepatic bile duct dilation. Moderate volume dependent gallstones noted. Normal gallbladder wall thickness. No pericholecystic inflammatory changes. Pancreas: Unremarkable. No pancreatic ductal dilatation or surrounding inflammatory changes. Spleen: Within normal limits. There is a 1.4 x 1.5 cm simple cyst arising from the upper pole. Adrenals/Urinary Tract: Adrenal glands are unremarkable. There is a heterogeneous approximately 4.1 by 6.9 x 4.5 cm (anteroposterior x transverse x craniocaudal)  contour deforming lesion centered in the right kidney upper pole with protrusion into the sinus fat. This is highly concerning for renal neoplasm. There are few adjacent retrocaval lymph nodes noted with short axis less than 6 mm. Renal vein is patent. No perinephric fat stranding. Bilateral kidneys are otherwise unremarkable. No hydroureteronephrosis or nephroureterolithiasis. Unremarkable urinary bladder. Stomach/Bowel: There is a small sliding hiatal hernia. No disproportionate dilation of the small or large bowel loops. No evidence of abnormal bowel wall thickening or inflammatory changes. The appendix is unremarkable. Vascular/Lymphatic: No ascites or pneumoperitoneum. No abdominal or pelvic lymphadenopathy, by size criteria. No aneurysmal dilation of the major abdominal arteries. There are moderate peripheral atherosclerotic vascular calcifications of the aorta and its major branches. Reproductive: Enlarged prostate. Symmetric seminal vesicles. Other: There is a tiny fat containing umbilical hernia. The soft tissues and abdominal wall are otherwise unremarkable. Musculoskeletal: There are several subcentimeter sized sclerotic foci in the T11 vertebral body, L2 vertebral body and L1 spinous process, which are indeterminate. No pathological fracture. There are mild multilevel degenerative changes in the visualized spine. IMPRESSION: 1. There is a 6.9 cm heterogeneous lesion centered in the right kidney upper pole with protrusion into the sinus fat. This is highly concerning for renal neoplasm. There are few adjacent retrocaval lymph nodes noted with short axis less than 6 mm. Renal vein is patent. 2. Innumerable nodules in the visualized  bilateral lungs, compatible with metastasis. Please refer to same-day performed CT scan chest report for details. 3. Several subcentimeter sized sclerotic foci in the T11 vertebral body, L2 vertebral body and L1 spinous process, which are indeterminate. No pathological fracture.  4. Multiple other nonacute observations, as described above. Aortic Atherosclerosis (ICD10-I70.0). Electronically Signed   By: Jules Schick M.D.   On: 03/16/2023 14:22   CT Chest Wo Contrast  Result Date: 03/16/2023 CLINICAL DATA:  Lung nodules. EXAM: CT CHEST WITHOUT CONTRAST TECHNIQUE: Multidetector CT imaging of the chest was performed following the standard protocol without IV contrast. RADIATION DOSE REDUCTION: This exam was performed according to the departmental dose-optimization program which includes automated exposure control, adjustment of the mA and/or kV according to patient size and/or use of iterative reconstruction technique. COMPARISON:  X-ray 03/15/2023 FINDINGS: Cardiovascular: Status post median sternotomy. Heart is nonenlarged. No pericardial effusion. On this non IV contrast exam thoracic aorta has a normal course and caliber. Mediastinum/Nodes: Heterogeneous thyroid gland. Slightly patulous thoracic esophagus with a small hiatal hernia. No specific abnormal lymph node enlargement identified in the axillary regions, hilum or mediastinum. Lungs/Pleura: Advanced centrilobular emphysematous lung changes greatest in the upper lung zones. There is some left apical pleural blebs as well. Areas of scarring and fibrotic changes. There is a calcified nodule seen in the lingula on image 113 of series 4. However there are numerous noncalcified lung nodules identified. The vast majority are cm in size or smaller. There are a few larger areas as well such as right lower lobe on series 4, image 115 measuring 20 by 16 mm. Left lower lobe focus on image 125 measuring 16 by 13 mm. Please correlate for any known history such as malignancy. Otherwise recommend further evaluation. No pleural effusion or pneumothorax. Upper Abdomen: Adrenal glands are preserved in the upper abdomen. Multiple stones in the nondilated gallbladder. Possible mass lesion involving the right kidney. Recommend abdominal imaging with  contrast. Musculoskeletal: Advanced degenerative changes of the spine. IMPRESSION: Numerous lung nodules identified worrisome for metastatic disease. Possible mass lesion involving the right kidney. Recommend follow up contrast CT scan of the abdomen and pelvis to further delineate. Gallstones. Aortic Atherosclerosis (ICD10-I70.0) and Emphysema (ICD10-J43.9). Electronically Signed   By: Karen Kays M.D.   On: 03/16/2023 11:41   DG Chest 2 View  Result Date: 03/15/2023 CLINICAL DATA:  Chronic cough. EXAM: CHEST - 2 VIEW COMPARISON:  11/02/2017 FINDINGS: Interval development of patchy and nodular subpleural and airspace disease in both lung bases. Stable left lower lobe pulmonary nodule compatible with calcified granuloma. No substantial pleural effusion. Lungs are hyperexpanded. Cardiopericardial silhouette is at upper limits of normal for size. No acute bony abnormality. IMPRESSION: Interval development of basilar predominant patchy and nodular subpleural and airspace disease in both lung bases. Imaging features could be related to multifocal pneumonia but given nodular character in some regions, chest CT recommended to further evaluate. Electronically Signed   By: Kennith Center M.D.   On: 03/15/2023 10:32    Microbiology: Results for orders placed or performed during the hospital encounter of 03/16/23  Urine Culture (for pregnant, neutropenic or urologic patients or patients with an indwelling urinary catheter)     Status: None   Collection Time: 03/16/23  1:01 PM   Specimen: Urine, Clean Catch  Result Value Ref Range Status   Specimen Description   Final    URINE, CLEAN CATCH Performed at [facility], [address], [place], [place] [address]    Special Requests  Final    NONE Performed at [facility], [address], [place], [place] [address]    Culture   Final    NO GROWTH Performed at [facility], [address], [place], [place] [address]    Report Status 03/17/2023 FINAL   Final  Culture, blood (Routine X 2) w Reflex to ID Panel     Status: None (Preliminary result)   Collection Time: 03/16/23  5:59 PM   Specimen: BLOOD  Result Value Ref Range Status   Specimen Description BLOOD BLOOD RIGHT ARM  Final   Special Requests   Final    BOTTLES DRAWN AEROBIC AND ANAEROBIC Blood Culture adequate volume   Culture   Final    NO GROWTH 2 DAYS Performed at [facility], [address], [place], [place] [address]    Report Status PENDING  Incomplete  Culture, blood (Routine X 2) w Reflex to ID Panel     Status: None (Preliminary result)   Collection Time: 03/16/23  6:03 PM   Specimen: BLOOD  Result Value Ref Range Status   Specimen Description BLOOD BLOOD LEFT ARM  Final   Special Requests   Final    BOTTLES DRAWN AEROBIC AND ANAEROBIC Blood Culture adequate volume   Culture   Final    NO GROWTH 2 DAYS Performed at [facility], [address] [place], [place] [address]    Report Status PENDING  Incomplete    Labs: CBC: Recent Labs  Lab 03/16/23 1124 03/16/23 1759 03/17/23 0455 03/18/23 0421  WBC 24.3* 25.9* 21.4* 22.8*  NEUTROABS 20.0* 21.2*  --   --   HGB 10.8* 10.7* 9.9* 9.5*  HCT 33.8* 33.9* 30.8* 30.0*  MCV 86.9 89.7 89.0 88.0  PLT 264 277 257 250   Basic Metabolic Panel: Recent Labs  Lab 03/16/23 1124 03/17/23 0455 03/18/23 0421  NA 131* 134* 134*  K 4.9 4.7 4.2  CL 102 105 106  CO2 21* 22 21*  GLUCOSE 115* 98 102*  BUN 35* 31* 24*  CREATININE 1.81* 1.58* 1.36*  CALCIUM 9.8 9.5 9.0   Liver Function Tests: Recent Labs  Lab 03/16/23 1124  AST 15  ALT 13  ALKPHOS 74  BILITOT 0.9  PROT 6.8  ALBUMIN 3.1*   CBG: No results for input(s): "GLUCAP" in the last 168 hours.  Discharge time spent: greater than 30 minutes.  Signed: Vassie Loll, MD [facility] 03/18/2023

## 2023-03-18 NOTE — Telephone Encounter (Signed)
   Name: Joshua Gentry  DOB: 1946-07-01  MRN: [id number]  Primary Cardiologist: Charlton Haws, MD   Preoperative team, please contact this patient and set up a phone call appointment for further preoperative risk assessment. Please obtain consent and complete medication review. Thank you for your help.  Given patient's recent hospital visit, virtual visit is indicated.   I confirm that guidance regarding antiplatelet and oral anticoagulation therapy has been completed and, if necessary, noted below.  Ideally aspirin should be continued without interruption, however if the bleeding risk is too great, aspirin may be held for 5-7 days prior to surgery. Please resume aspirin post operatively when it is felt to be safe from a bleeding standpoint.     Carlos Levering, NP 03/18/2023, 3:31 PM [facility]

## 2023-03-18 NOTE — Progress Notes (Signed)
Patient states understanding of discharge instructions.  

## 2023-03-18 NOTE — Telephone Encounter (Signed)
1st attempt to reach pt regarding surgical clearance and the need for a tele visit.  Left a message for pt to call back and ask for the preop team. 

## 2023-03-18 NOTE — Telephone Encounter (Signed)
   Pre-operative Risk Assessment    Patient Name: Joshua Gentry  DOB: 01-May-1946 MRN: [id number]      Request for Surgical Clearance    Procedure:   RIGHT RADICAL NEPHRECTOMY  Date of Surgery:  Clearance 04/05/23                                 Surgeon:  DR. Ronne Binning Surgeon's Group or Practice Name:  [facility] [place] Phone number:  [phone] Fax number:  [phone]   Type of Clearance Requested:   - Medical ; ASA    Type of Anesthesia:  General    Additional requests/questions:    Elpidio Anis   03/18/2023, 12:01 PM

## 2023-03-19 ENCOUNTER — Telehealth: Payer: Self-pay

## 2023-03-19 NOTE — Addendum Note (Signed)
Addended by: Anselm Lis A on: 03/19/2023 12:59 PM   Modules accepted: Orders

## 2023-03-19 NOTE — Telephone Encounter (Signed)
Pt is scheduled for tele on 8/14 at 3 pm. Med rec and consent done.

## 2023-03-19 NOTE — Telephone Encounter (Signed)
Pt is scheduled for tele on 8/14 at 3 pm. Med rec and consent done.    Patient Consent for Virtual Visit        Joshua Gentry has provided verbal consent on 03/19/2023 for a virtual visit (video or telephone).   CONSENT FOR VIRTUAL VISIT FOR:  Joshua Gentry  By participating in this virtual visit I agree to the following:  I hereby voluntarily request, consent and authorize [facility] and its employed or contracted physicians, physician assistants, nurse practitioners or other licensed health care professionals (the Practitioner), to provide me with telemedicine health care services (the "Services") as deemed necessary by the treating Practitioner. I acknowledge and consent to receive the Services by the Practitioner via telemedicine. I understand that the telemedicine visit will involve communicating with the Practitioner through live audiovisual communication technology and the disclosure of certain medical information by electronic transmission. I acknowledge that I have been given the opportunity to request an in-person assessment or other available alternative prior to the telemedicine visit and am voluntarily participating in the telemedicine visit.  I understand that I have the right to withhold or withdraw my consent to the use of telemedicine in the course of my care at any time, without affecting my right to future care or treatment, and that the Practitioner or I may terminate the telemedicine visit at any time. I understand that I have the right to inspect all information obtained and/or recorded in the course of the telemedicine visit and may receive copies of available information for a reasonable fee.  I understand that some of the potential risks of receiving the Services via telemedicine include:  Delay or interruption in medical evaluation due to technological equipment failure or disruption; Information transmitted may not be sufficient (e.g. poor resolution of  images) to allow for appropriate medical decision making by the Practitioner; and/or  In rare instances, security protocols could fail, causing a breach of personal health information.  Furthermore, I acknowledge that it is my responsibility to provide information about my medical history, conditions and care that is complete and accurate to the best of my ability. I acknowledge that Practitioner's advice, recommendations, and/or decision may be based on factors not within their control, such as incomplete or inaccurate data provided by me or distortions of diagnostic images or specimens that may result from electronic transmissions. I understand that the practice of medicine is not an exact science and that Practitioner makes no warranties or guarantees regarding treatment outcomes. I acknowledge that a copy of this consent can be made available to me via my patient portal [facility] MyChart), or I can request a printed copy by calling the office of [facility].    I understand that my insurance will be billed for this visit.   I have read or had this consent read to me. I understand the contents of this consent, which adequately explains the benefits and risks of the Services being provided via telemedicine.  I have been provided ample opportunity to ask questions regarding this consent and the Services and have had my questions answered to my satisfaction. I give my informed consent for the services to be provided through the use of telemedicine in my medical care

## 2023-03-21 NOTE — Progress Notes (Signed)
[facility] [address] [place] [address]   Clinic Day:  03/21/2023  Referring physician: Assunta Found, MD  Patient Care Team: Assunta Found, MD as PCP - General (Family Medicine) Wendall Stade, MD as PCP - Cardiology (Cardiology)   ASSESSMENT & PLAN:   Assessment: ***  Plan: ***  No orders of the defined types were placed in this encounter.     Alben Deeds Teague,acting as a Neurosurgeon for Doreatha Massed, MD.,have documented all relevant documentation on the behalf of Doreatha Massed, MD,as directed by  Doreatha Massed, MD while in the presence of Doreatha Massed, MD.   ***  [place]   8/4/20248:42 PM  CHIEF COMPLAINT/PURPOSE OF CONSULT:   Diagnosis: ***  Cancer Staging  No matching staging information was found for the patient.    Prior Therapy: ***  Current Therapy:  ***   HISTORY OF PRESENT ILLNESS:   Oncology History   No history exists.      Joshua Gentry is a 77 y.o. male presenting to clinic today for evaluation of a renal mass at the request of Dr. Gwenlyn Perking.  Patient was admitted from the ED on 03/16/23 for a renal  mass. Ct A/P found a 6.9 cm heterogeneous lesion centered in the right kidney upper pole with protrusion into the sinus fat, highly concerning for renal neoplasm; a few adjacent retrocaval lymph nodes noted with short axis less than 6 mm; innumerable nodules in the visualized bilateral lungs compatible with metastasis; and several subcentimeter sized sclerotic foci in the T11 vertebral body, L2 vertebral body and L1 spinous process, which are indeterminate. He was given 7 days of oral cefpodoxime at time of discharge on 8/1 and had a tentative right nephrectomy scheduled for 04/05/23.  Today, he states that he is doing well overall. His appetite level is at ***%. His energy level is at ***%.  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 s/p CABG 1997.   Heart attack (HCC)    Hyperlipidemia    Hypertension    Obesity (BMI 30-39.9)    OSA on CPAP March 2016   Compliant   RBBB with left anterior fascicular block     Surgical History: Past Surgical History:  Procedure Laterality Date   COLONOSCOPY N/A 08/01/2013   Procedure: COLONOSCOPY;  Surgeon: Dalia Heading, MD;  Location: [facility];  Service: Gastroenterology;  Laterality: N/A;   CORONARY ARTERY BYPASS GRAFT     x4   Lens Bilateral April 2016   Replacment    LOWER LEG SOFT TISSUE TUMOR EXCISION      Social History: Social History   Socioeconomic History   Marital status: Married    Spouse name: Not on file   Number of children: 3   Years of education: College   Highest education level: Not on file  Occupational History   Occupation: Quarry manager  Tobacco Use   Smoking status: Former    Current packs/day: 0.00    Average packs/day: 1.5 packs/day for 32.0 years (48.0 ttl pk-yrs)    Types: Cigarettes    Start date: 08/17/1964    Quit date: 08/17/1996    Years since quitting: 26.6   Smokeless tobacco: Never  Vaping Use   Vaping status: Never Used  Substance and Sexual Activity   Alcohol use: Not Currently  Comment: Occasional    Drug use: No   Sexual activity: Not on file  Other Topics Concern   Not on file  Social History Narrative   Married with 3 children   No regular exercise   Drinks about 48oz of coke a day    Social Determinants of Health   Financial Resource Strain: Not on file  Food Insecurity: No Food Insecurity (03/16/2023)   Hunger Vital Sign    Worried About Running Out of Food in the Last Year: Never true    Ran Out of Food in the Last Year: Never true  Transportation Needs: No Transportation Needs (03/16/2023)   PRAPARE - Administrator, Civil Service (Medical): No    Lack of Transportation (Non-Medical): No  Physical Activity: Not on file  Stress:  Not on file  Social Connections: Not on file  Intimate Partner Violence: Not At Risk (03/16/2023)   Humiliation, Afraid, Rape, and Kick questionnaire    Fear of Current or Ex-Partner: No    Emotionally Abused: No    Physically Abused: No    Sexually Abused: No    Family History: Family History  Problem Relation Age of Onset   Hypertension Mother    Diabetes Mother    Heart attack Father    Diabetes Brother     Current Medications:  Current Outpatient Medications:    calcium-vitamin D 250-100 MG-UNIT per tablet, Take 1 tablet by mouth 2 (two) times daily., Disp: , Rfl:    cefpodoxime (VANTIN) 200 MG tablet, Take 1 tablet (200 mg total) by mouth 2 (two) times daily for 7 days., Disp: 14 tablet, Rfl: 0   metoprolol succinate (TOPROL XL) 100 MG 24 hr tablet, Take 1 tablet (100 mg total) by mouth daily. Take with or immediately following a meal., Disp: 90 tablet, Rfl: 3   Multiple Vitamins-Minerals (MULTIVITAMIN WITH MINERALS) tablet, Take 1 tablet by mouth daily., Disp: , Rfl:    nitroGLYCERIN (NITROSTAT) 0.4 MG SL tablet, DISSOLVE 1 TABLET UNDER THE TONGUE EVERY 5 MINUTES AS NEEDED FOR CHEST PAIN, DO NOT EXCEED A TOTAL OF 3 DOSES IN 15 MINUTES, Disp: 25 tablet, Rfl: 3   rosuvastatin (CRESTOR) 20 MG tablet, TAKE 1 TABLET(20 MG) BY MOUTH DAILY (Patient taking differently: Take 20 mg by mouth daily.), Disp: 90 tablet, Rfl: 3   Allergies: Allergies  Allergen Reactions   Niacin    Penicillins     Patient can't recall reaction- happened a long time ago    REVIEW OF SYSTEMS:   Review of Systems  Constitutional:  Negative for chills, fatigue and fever.  HENT:   Negative for lump/mass, mouth sores, nosebleeds, sore throat and trouble swallowing.   Eyes:  Negative for eye problems.  Respiratory:  Negative for cough and shortness of breath.   Cardiovascular:  Negative for chest pain, leg swelling and palpitations.  Gastrointestinal:  Negative for abdominal pain, constipation, diarrhea,  nausea and vomiting.  Genitourinary:  Negative for bladder incontinence, difficulty urinating, dysuria, frequency, hematuria and nocturia.   Musculoskeletal:  Negative for arthralgias, back pain, flank pain, myalgias and neck pain.  Skin:  Negative for itching and rash.  Neurological:  Negative for dizziness, headaches and numbness.  Hematological:  Does not bruise/bleed easily.  Psychiatric/Behavioral:  Negative for depression, sleep disturbance and suicidal ideas. The patient is not nervous/anxious.   All other systems reviewed and are negative.    VITALS:   There were no vitals taken for this visit.  Wt Readings  from Last 3 Encounters:  03/17/23 256 lb 13.4 oz (116.5 kg)  01/22/23 260 lb (117.9 kg)  01/21/22 251 lb (113.9 kg)    There is no height or weight on file to calculate BMI.  Performance status (ECOG): {CHL ONC [id number]  PHYSICAL EXAM:   Physical Exam Vitals and nursing note reviewed. Exam conducted with a chaperone present.  Constitutional:      Appearance: Normal appearance.  Cardiovascular:     Rate and Rhythm: Normal rate and regular rhythm.     Pulses: Normal pulses.     Heart sounds: Normal heart sounds.  Pulmonary:     Effort: Pulmonary effort is normal.     Breath sounds: Normal breath sounds.  Abdominal:     Palpations: Abdomen is soft. There is no hepatomegaly, splenomegaly or mass.     Tenderness: There is no abdominal tenderness.  Musculoskeletal:     Right lower leg: No edema.     Left lower leg: No edema.  Lymphadenopathy:     Cervical: No cervical adenopathy.     Right cervical: No superficial, deep or posterior cervical adenopathy.    Left cervical: No superficial, deep or posterior cervical adenopathy.     Upper Body:     Right upper body: No supraclavicular or axillary adenopathy.     Left upper body: No supraclavicular or axillary adenopathy.  Neurological:     General: No focal deficit present.     Mental Status: He is alert and  oriented to person, place, and time.  Psychiatric:        Mood and Affect: Mood normal.        Behavior: Behavior normal.     LABS:      Latest Ref Rng & Units 03/18/2023    4:21 AM 03/17/2023    4:55 AM 03/16/2023    5:59 PM  CBC  WBC 4.0 - 10.5 K/uL 22.8  21.4  25.9   Hemoglobin 13.0 - 17.0 g/dL 9.5  9.9  84.1   Hematocrit 39.0 - 52.0 % 30.0  30.8  33.9   Platelets 150 - 400 K/uL 250  257  277       Latest Ref Rng & Units 03/18/2023    4:21 AM 03/17/2023    4:55 AM 03/16/2023   11:24 AM  CMP  Glucose 70 - 99 mg/dL 324  98  401   BUN 8 - 23 mg/dL 24  31  35   Creatinine 0.61 - 1.24 mg/dL 0.27  2.53  6.64   Sodium 135 - 145 mmol/L 134  134  131   Potassium 3.5 - 5.1 mmol/L 4.2  4.7  4.9   Chloride 98 - 111 mmol/L 106  105  102   CO2 22 - 32 mmol/L 21  22  21    Calcium 8.9 - 10.3 mg/dL 9.0  9.5  9.8   Total Protein 6.5 - 8.1 g/dL   6.8   Total Bilirubin 0.3 - 1.2 mg/dL   0.9   Alkaline Phos 38 - 126 U/L   74   AST 15 - 41 U/L   15   ALT 0 - 44 U/L   13      No results found for: "CEA1", "CEA" / No results found for: "CEA1", "CEA" No results found for: "PSA1" No results found for: "QIH474" No results found for: "CAN125"  No results found for: "TOTALPROTELP", "ALBUMINELP", "A1GS", "A2GS", "BETS", "BETA2SER", "GAMS", "MSPIKE", "SPEI" Lab Results  Component Value Date  TIBC 234 (L) 03/16/2023   FERRITIN 436 (H) 03/16/2023   IRONPCTSAT 11 (L) 03/16/2023   No results found for: "LDH"   STUDIES:   CT ABDOMEN PELVIS W CONTRAST  Result Date: 03/16/2023 CLINICAL DATA:  Abdominal pain, acute, nonlocalized EXAM: CT ABDOMEN AND PELVIS WITH CONTRAST TECHNIQUE: Multidetector CT imaging of the abdomen and pelvis was performed using the standard protocol following bolus administration of intravenous contrast. RADIATION DOSE REDUCTION: This exam was performed according to the departmental dose-optimization program which includes automated exposure control, adjustment of the mA  and/or kV according to patient size and/or use of iterative reconstruction technique. CONTRAST:  80mL OMNIPAQUE IOHEXOL 300 MG/ML  SOLN COMPARISON:  None Available. FINDINGS: Lower chest: There are innumerable nodules in the visualized bilateral lungs, compatible with metastasis. Please refer to separately dictated CT scan chest performed earlier the same day. The heart is normal in size. No pericardial effusion. Hepatobiliary: The liver is normal in size. Non-cirrhotic configuration. There are multiple hypoattenuating structures in the liver with largest in the left hepatic dome measuring up to 3.2 x 4.3 cm, which can be characterized as a simple cyst. There are multiple subcentimeter hypoattenuating foci throughout liver, which are too small to adequately characterize but favored to represent cysts as well. They can be better characterized with contrast-enhanced MRI abdomen, if clinically indicated. No intrahepatic or extrahepatic bile duct dilation. Moderate volume dependent gallstones noted. Normal gallbladder wall thickness. No pericholecystic inflammatory changes. Pancreas: Unremarkable. No pancreatic ductal dilatation or surrounding inflammatory changes. Spleen: Within normal limits. There is a 1.4 x 1.5 cm simple cyst arising from the upper pole. Adrenals/Urinary Tract: Adrenal glands are unremarkable. There is a heterogeneous approximately 4.1 by 6.9 x 4.5 cm (anteroposterior x transverse x craniocaudal) contour deforming lesion centered in the right kidney upper pole with protrusion into the sinus fat. This is highly concerning for renal neoplasm. There are few adjacent retrocaval lymph nodes noted with short axis less than 6 mm. Renal vein is patent. No perinephric fat stranding. Bilateral kidneys are otherwise unremarkable. No hydroureteronephrosis or nephroureterolithiasis. Unremarkable urinary bladder. Stomach/Bowel: There is a small sliding hiatal hernia. No disproportionate dilation of the small or  large bowel loops. No evidence of abnormal bowel wall thickening or inflammatory changes. The appendix is unremarkable. Vascular/Lymphatic: No ascites or pneumoperitoneum. No abdominal or pelvic lymphadenopathy, by size criteria. No aneurysmal dilation of the major abdominal arteries. There are moderate peripheral atherosclerotic vascular calcifications of the aorta and its major branches. Reproductive: Enlarged prostate. Symmetric seminal vesicles. Other: There is a tiny fat containing umbilical hernia. The soft tissues and abdominal wall are otherwise unremarkable. Musculoskeletal: There are several subcentimeter sized sclerotic foci in the T11 vertebral body, L2 vertebral body and L1 spinous process, which are indeterminate. No pathological fracture. There are mild multilevel degenerative changes in the visualized spine. IMPRESSION: 1. There is a 6.9 cm heterogeneous lesion centered in the right kidney upper pole with protrusion into the sinus fat. This is highly concerning for renal neoplasm. There are few adjacent retrocaval lymph nodes noted with short axis less than 6 mm. Renal vein is patent. 2. Innumerable nodules in the visualized bilateral lungs, compatible with metastasis. Please refer to same-day performed CT scan chest report for details. 3. Several subcentimeter sized sclerotic foci in the T11 vertebral body, L2 vertebral body and L1 spinous process, which are indeterminate. No pathological fracture. 4. Multiple other nonacute observations, as described above. Aortic Atherosclerosis (ICD10-I70.0). Electronically Signed   By: Timoteo Expose.D.  On: 03/16/2023 14:22   CT Chest Wo Contrast  Result Date: 03/16/2023 CLINICAL DATA:  Lung nodules. EXAM: CT CHEST WITHOUT CONTRAST TECHNIQUE: Multidetector CT imaging of the chest was performed following the standard protocol without IV contrast. RADIATION DOSE REDUCTION: This exam was performed according to the departmental dose-optimization program  which includes automated exposure control, adjustment of the mA and/or kV according to patient size and/or use of iterative reconstruction technique. COMPARISON:  X-ray 03/15/2023 FINDINGS: Cardiovascular: Status post median sternotomy. Heart is nonenlarged. No pericardial effusion. On this non IV contrast exam thoracic aorta has a normal course and caliber. Mediastinum/Nodes: Heterogeneous thyroid gland. Slightly patulous thoracic esophagus with a small hiatal hernia. No specific abnormal lymph node enlargement identified in the axillary regions, hilum or mediastinum. Lungs/Pleura: Advanced centrilobular emphysematous lung changes greatest in the upper lung zones. There is some left apical pleural blebs as well. Areas of scarring and fibrotic changes. There is a calcified nodule seen in the lingula on image 113 of series 4. However there are numerous noncalcified lung nodules identified. The vast majority are cm in size or smaller. There are a few larger areas as well such as right lower lobe on series 4, image 115 measuring 20 by 16 mm. Left lower lobe focus on image 125 measuring 16 by 13 mm. Please correlate for any known history such as malignancy. Otherwise recommend further evaluation. No pleural effusion or pneumothorax. Upper Abdomen: Adrenal glands are preserved in the upper abdomen. Multiple stones in the nondilated gallbladder. Possible mass lesion involving the right kidney. Recommend abdominal imaging with contrast. Musculoskeletal: Advanced degenerative changes of the spine. IMPRESSION: Numerous lung nodules identified worrisome for metastatic disease. Possible mass lesion involving the right kidney. Recommend follow up contrast CT scan of the abdomen and pelvis to further delineate. Gallstones. Aortic Atherosclerosis (ICD10-I70.0) and Emphysema (ICD10-J43.9). Electronically Signed   By: Karen Kays M.D.   On: 03/16/2023 11:41   DG Chest 2 View  Result Date: 03/15/2023 CLINICAL DATA:  Chronic  cough. EXAM: CHEST - 2 VIEW COMPARISON:  11/02/2017 FINDINGS: Interval development of patchy and nodular subpleural and airspace disease in both lung bases. Stable left lower lobe pulmonary nodule compatible with calcified granuloma. No substantial pleural effusion. Lungs are hyperexpanded. Cardiopericardial silhouette is at upper limits of normal for size. No acute bony abnormality. IMPRESSION: Interval development of basilar predominant patchy and nodular subpleural and airspace disease in both lung bases. Imaging features could be related to multifocal pneumonia but given nodular character in some regions, chest CT recommended to further evaluate. Electronically Signed   By: Kennith Center M.D.   On: 03/15/2023 10:32

## 2023-03-22 ENCOUNTER — Inpatient Hospital Stay: Payer: Medicare Other | Attending: Hematology | Admitting: Hematology

## 2023-03-22 ENCOUNTER — Inpatient Hospital Stay: Payer: Medicare Other

## 2023-03-22 ENCOUNTER — Encounter: Payer: Self-pay | Admitting: Hematology

## 2023-03-22 VITALS — BP 110/56 | HR 89 | Temp 98.2°F | Resp 18 | Ht 73.0 in | Wt 256.9 lb

## 2023-03-22 DIAGNOSIS — R319 Hematuria, unspecified: Secondary | ICD-10-CM | POA: Diagnosis not present

## 2023-03-22 DIAGNOSIS — K449 Diaphragmatic hernia without obstruction or gangrene: Secondary | ICD-10-CM | POA: Insufficient documentation

## 2023-03-22 DIAGNOSIS — N2889 Other specified disorders of kidney and ureter: Secondary | ICD-10-CM | POA: Diagnosis not present

## 2023-03-22 DIAGNOSIS — E042 Nontoxic multinodular goiter: Secondary | ICD-10-CM | POA: Diagnosis not present

## 2023-03-22 DIAGNOSIS — G4733 Obstructive sleep apnea (adult) (pediatric): Secondary | ICD-10-CM | POA: Insufficient documentation

## 2023-03-22 DIAGNOSIS — Z87891 Personal history of nicotine dependence: Secondary | ICD-10-CM | POA: Diagnosis not present

## 2023-03-22 DIAGNOSIS — I10 Essential (primary) hypertension: Secondary | ICD-10-CM | POA: Insufficient documentation

## 2023-03-22 DIAGNOSIS — N23 Unspecified renal colic: Secondary | ICD-10-CM | POA: Insufficient documentation

## 2023-03-22 DIAGNOSIS — D500 Iron deficiency anemia secondary to blood loss (chronic): Secondary | ICD-10-CM | POA: Diagnosis not present

## 2023-03-22 DIAGNOSIS — R059 Cough, unspecified: Secondary | ICD-10-CM | POA: Diagnosis not present

## 2023-03-22 DIAGNOSIS — I2510 Atherosclerotic heart disease of native coronary artery without angina pectoris: Secondary | ICD-10-CM | POA: Diagnosis not present

## 2023-03-22 DIAGNOSIS — R918 Other nonspecific abnormal finding of lung field: Secondary | ICD-10-CM

## 2023-03-22 DIAGNOSIS — I700 Atherosclerosis of aorta: Secondary | ICD-10-CM | POA: Diagnosis not present

## 2023-03-22 DIAGNOSIS — E785 Hyperlipidemia, unspecified: Secondary | ICD-10-CM | POA: Insufficient documentation

## 2023-03-22 DIAGNOSIS — K429 Umbilical hernia without obstruction or gangrene: Secondary | ICD-10-CM | POA: Diagnosis not present

## 2023-03-22 DIAGNOSIS — Z79899 Other long term (current) drug therapy: Secondary | ICD-10-CM | POA: Diagnosis not present

## 2023-03-22 DIAGNOSIS — I252 Old myocardial infarction: Secondary | ICD-10-CM | POA: Insufficient documentation

## 2023-03-22 DIAGNOSIS — I4510 Unspecified right bundle-branch block: Secondary | ICD-10-CM | POA: Diagnosis not present

## 2023-03-22 LAB — CBC WITH DIFFERENTIAL/PLATELET
Abs Immature Granulocytes: 1.29 10*3/uL — ABNORMAL HIGH (ref 0.00–0.07)
Basophils Absolute: 0.2 10*3/uL — ABNORMAL HIGH (ref 0.0–0.1)
Basophils Relative: 1 %
Eosinophils Absolute: 1.1 10*3/uL — ABNORMAL HIGH (ref 0.0–0.5)
Eosinophils Relative: 3 %
HCT: 34.5 % — ABNORMAL LOW (ref 39.0–52.0)
Hemoglobin: 11.1 g/dL — ABNORMAL LOW (ref 13.0–17.0)
Immature Granulocytes: 3 %
Lymphocytes Relative: 5 %
Lymphs Abs: 2 10*3/uL (ref 0.7–4.0)
MCH: 28 pg (ref 26.0–34.0)
MCHC: 32.2 g/dL (ref 30.0–36.0)
MCV: 86.9 fL (ref 80.0–100.0)
Monocytes Absolute: 1.3 10*3/uL — ABNORMAL HIGH (ref 0.1–1.0)
Monocytes Relative: 3 %
Neutro Abs: 32.7 10*3/uL — ABNORMAL HIGH (ref 1.7–7.7)
Neutrophils Relative %: 85 %
Platelets: 295 10*3/uL (ref 150–400)
RBC: 3.97 MIL/uL — ABNORMAL LOW (ref 4.22–5.81)
RDW: 14.9 % (ref 11.5–15.5)
WBC: 38.5 10*3/uL — ABNORMAL HIGH (ref 4.0–10.5)
nRBC: 0 % (ref 0.0–0.2)

## 2023-03-22 LAB — COMPREHENSIVE METABOLIC PANEL
ALT: 18 U/L (ref 0–44)
AST: 18 U/L (ref 15–41)
Albumin: 3.1 g/dL — ABNORMAL LOW (ref 3.5–5.0)
Alkaline Phosphatase: 111 U/L (ref 38–126)
Anion gap: 7 (ref 5–15)
BUN: 17 mg/dL (ref 8–23)
CO2: 22 mmol/L (ref 22–32)
Calcium: 10.6 mg/dL — ABNORMAL HIGH (ref 8.9–10.3)
Chloride: 103 mmol/L (ref 98–111)
Creatinine, Ser: 1.35 mg/dL — ABNORMAL HIGH (ref 0.61–1.24)
GFR, Estimated: 55 mL/min — ABNORMAL LOW (ref >60)
Glucose, Bld: 141 mg/dL — ABNORMAL HIGH (ref 70–99)
Potassium: 4.2 mmol/L (ref 3.5–5.1)
Sodium: 132 mmol/L — ABNORMAL LOW (ref 135–145)
Total Bilirubin: 0.9 mg/dL (ref 0.3–1.2)
Total Protein: 6.9 g/dL (ref 6.5–8.1)

## 2023-03-22 LAB — LACTATE DEHYDROGENASE: LDH: 108 U/L (ref 98–192)

## 2023-03-22 LAB — FOLATE: Folate: 14.1 ng/mL (ref >5.9)

## 2023-03-22 LAB — VITAMIN B12: Vitamin B-12: 537 pg/mL (ref 180–914)

## 2023-03-22 NOTE — Patient Instructions (Addendum)
[facility]  Discharge Instructions  You were seen and examined today by Dr. Ellin Saba. Dr. Ellin Saba is a medical oncologist, meaning that he specializes in the treatment of cancer diagnoses. Dr. Ellin Saba discussed your past medical history, family history of cancers, and the events that led to you being here today.  You were referred to Dr. Ellin Saba due to an abnormal CT scan which revealed a mass on your right kidney concerning for renal cell (kidney) cancer. Unfortunately, there are also spots noted in your spinal bones as well as your lungs highly concerning for cancer having spread.  Dr. Ellin Saba has recommended proceeding with surgery as scheduled. Dr. Ellin Saba will arrange for you to have a bone scan to better visualize the areas of concern on your bones.  Follow-up as scheduled, approximately 4 weeks after surgery to discuss the need for ongoing treatment.  Thank you for choosing [facility] to provide your oncology and hematology care.   To afford each patient quality time with our provider, please arrive at least 15 minutes before your scheduled appointment time. You may need to reschedule your appointment if you arrive late (10 or more minutes). Arriving late affects you and other patients whose appointments are after yours.  Also, if you miss three or more appointments without notifying the office, you may be dismissed from the clinic at the provider's discretion.    Again, thank you for choosing [facility].  Our hope is that these requests will decrease the amount of time that you wait before being seen by our physicians.   If you have a lab appointment with the Cancer Center - please note that after April 8th, all labs will be drawn in the cancer center.  You do not have to check in or register with the main entrance as you have in the past but will complete your check-in at the cancer center.             _____________________________________________________________  Should you have questions after your visit to [facility], please contact our office at [phone] and follow the prompts.  Our office hours are 8:00 a.m. to 4:30 p.m. Monday - Thursday and 8:00 a.m. to 2:30 p.m. Friday.  Please note that voicemails left after 4:00 p.m. may not be returned until the following business day.  We are closed weekends and all major holidays.  You do have access to a nurse 24-7, just call the main number to the clinic [phone] and do not press any options, hold on the line and a nurse will answer the phone.    For prescription refill requests, have your pharmacy contact our office and allow 72 hours.    Masks are no longer required in the cancer centers. If you would like for your care team to wear a mask while they are taking care of you, please let them know. You may have one support person who is at least 75 years old accompany you for your appointments.

## 2023-03-23 ENCOUNTER — Ambulatory Visit (HOSPITAL_COMMUNITY): Payer: Medicare Other | Attending: Neurosurgery

## 2023-03-23 DIAGNOSIS — M545 Low back pain, unspecified: Secondary | ICD-10-CM | POA: Insufficient documentation

## 2023-03-23 NOTE — Therapy (Signed)
OUTPATIENT PHYSICAL THERAPY DISCHARGE PHYSICAL THERAPY DISCHARGE SUMMARY  Visits from Start of Care: 7  Current functional level related to goals / functional outcomes: See below   Remaining deficits: See below   Education / Equipment: See below   Patient agrees to discharge. Patient goals were not met. Patient is being discharged due to a change in medical status. 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 s/p CABG 1997.   Heart attack (HCC)    Hyperlipidemia    Hypertension    Obesity (BMI 30-39.9)    OSA on CPAP March 2016   Compliant   RBBB with left anterior fascicular block    Past Surgical History:  Procedure Laterality Date   COLONOSCOPY N/A 08/01/2013   Procedure: COLONOSCOPY;  Surgeon: Dalia Heading, MD;  [facility]: [facility];  Service: Gastroenterology;  Laterality: N/A;   CORONARY ARTERY BYPASS GRAFT     x4   Lens Bilateral April 2016   Replacment    LOWER LEG SOFT TISSUE TUMOR EXCISION     Patient Active Problem List   Diagnosis Date Noted   Gross hematuria 03/18/2023   Leukocytosis/SIRS 03/16/2023   Normocytic anemia 03/16/2023   Acute renal failure (ARF) (HCC) 03/16/2023   Renal mass, right 03/16/2023   Multiple lung nodules concerning for metastatic disesae 03/16/2023   OSA on CPAP 04/09/2015   Liposarcoma (HCC) 07/06/2013   RBBB plus LA hemiblock 03/21/2013    Obesity (BMI 30-39.9) 07/22/2009   Dyslipidemia, goal LDL below 70 01/09/2009   Essential hypertension 01/09/2009   Hx of CABG 01/09/2009    PCP: Assunta Found, MD  REFERRING PROVIDER: Lisbeth Renshaw, MD  REFERRING DIAG: [phone] (ICD-10-CM) - Spinal stenosis, lumbar region with neurogenic claudication  Rationale for Evaluation and Treatment: Rehabilitation  THERAPY DIAG:  Low back pain, unspecified back pain laterality, unspecified chronicity, unspecified whether sciatica present  ONSET DATE: 2019  SUBJECTIVE:  SUBJECTIVE STATEMENT: Patient with recent diagnosis of kidney cancer  Evaluation:Tripped back in 2019; caused him issues with his back and down his legs; ended up doing some therapy and having an epidural injection that helped him a lot;   Now pain has returned over the past 2 months; notices especially with playing golf and with prolonged standing.  Saw Nundkumar, who ordered another epidural and physical therapy.    PERTINENT HISTORY:  Chronic leg and back pain; had epidural injection about a month ago.    PAIN:  Are you having pain? Yes: NPRS scale: at most 4/10; best 0 currently 1/10 Pain location: mid back mostly; occasionally in legs to knees Pain description: aching Aggravating factors: prolonged standing, walking, prolonged sitting Relieving factors: laying down, relaxing; doesn't bother him sleeping  PRECAUTIONS: None  WEIGHT BEARING RESTRICTIONS: No  FALLS:  Has patient fallen in last 6 months? Yes. Number of falls 1-2  OCCUPATION: retired  PLOF: Independent  PATIENT GOALS: like be able to play golf without pain  NEXT MD VISIT: 02/17/23  OBJECTIVE:   DIAGNOSTIC FINDINGS:  CLINICAL DATA:  Low back pain extending down legs, 6 weeks duration.   EXAM: MRI LUMBAR  SPINE WITHOUT CONTRAST   TECHNIQUE: Multiplanar, multisequence MR imaging of the lumbar spine was performed. No intravenous contrast was administered.   COMPARISON:  None.   FINDINGS: Segmentation:  5 lumbar type vertebral bodies.   Alignment:  Normal except for 2 mm retrolisthesis L2-3 and L3-4.   Vertebrae:  No fracture or primary bone lesion.   Conus medullaris and cauda equina: Conus extends to the L1 level. Conus and cauda equina appear normal.   Paraspinal and other soft tissues: Negative   Disc levels:   Likely congenital diminutive discs at T11-12, T12-L1 and L1-2. No stenosis or neural compression at those levels.   L2-3: 2 mm retrolisthesis. Mild bulging of the disc. Mild facet and ligamentous hypertrophy. Mild narrowing of both lateral recesses but without demonstrable neural compression.   L3-4: 2 mm retrolisthesis. Mild bulging of the disc. Facet and ligamentous hypertrophy. Stenosis of the canal and both lateral recesses that could cause neural compression on either or both sides. Some edema between the spinous processes likely indicating abutment, which could be associated with some back pain.   L4-5: Endplate osteophytes and mild bulging of the disc. Facet and ligamentous hypertrophy. Stenosis of both lateral recesses that could cause neural compression.   L5-S1: Minimal disc bulge. Facet osteoarthritis. No canal or foraminal stenosis.   IMPRESSION: L3-4: Multifactorial canal stenosis and bilateral lateral recess stenosis that could cause neural compression on either or both sides. Findings at this level could also be associated with back pain.   L4-5: Bilateral lateral recess stenosis that could cause neural compression on either or both sides. Findings could also be associated with back pain.   L5-S1: Facet osteoarthritis which could be associated with back pain. No stenosis or neural compression.   L2-3: Mild bilateral lateral recess stenosis  without visible neural compression.  PATIENT SURVEYS:  Modified Oswestry 15/50; 30%   COGNITION: Overall cognitive status: Within functional limits for tasks assessed     SENSATION: WFL  POSTURE: rounded shoulders, forward head, and decreased lumbar lordosis  PALPATION: General tenderness lumbar spine  LUMBAR ROM:   AROM eval  Flexion 60% available  Extension 10% available  Right lateral flexion 6" above knee  Left lateral flexion 6" above knee  Right rotation   Left rotation    (Blank rows =  not tested)  LOWER EXTREMITY MMT:    MMT Right eval Left eval Right 03/03/23 Left 03/03/23  Hip flexion 4+ 4+    Hip extension   Prone: 4/5 Prone: 4-/5  Hip abduction   4/5 4  Hip adduction      Hip internal rotation      Hip external rotation      Knee flexion   5/5 5/5  Knee extension 5 5    Ankle dorsiflexion 5 5    Ankle plantarflexion      Ankle inversion      Ankle eversion       (Blank rows = not tested)    FUNCTIONAL TESTS:  5 times sit to stand: 13.34 sec no UE assist  GAIT: Distance walked: 50 ft in clinic Assistive device utilized: None Level of assistance: Complete Independence Comments: decreased trunk rotation  TODAY'S TREATMENT:                                                                                                                              DATE:  03/23/23 No treatment today; discharge due to change in medical status 03/10/23 Standing: heelraise (warm up) 20X  Hip extension 10X2  Hip abduction 10X2  Hamstring stretch with 12" step 3X30" each LE  3D hip excursions 10X each (lat, rot, A/P)  Green TB shoulder extension 10X2, rows 10X2  Paloff GTB 2X10 each direction normal BOS  Tandem stance 30" X3 on balance beam foam each LE no UE  Wall arch 10X5" Nustep 5 minutes UE/LE level 5   03/08/23 Standing: heelraise (warm up) 20X  Hip extension 10X  Hip abduction 10X  Hamstring stretch with 12" step  3D hip excursions 10X each (lat, rot,  A/P)  Green TB shoulder extension 10X2   Rows 10X2  Tandem stance 30" each LE no UE  Wall arch 10X5" Seated: cervical retractions 10X5" Supine:  lower trunk rotation 10X5"  03/05/23: Standing:  Wall arch 10x 5"  RTB shoulder extension 10x   RTB rows 10x  3D hip excursion (weight shift with UE overhead stretch, rotation, squat then lumbar extension)  March with ab set  Hamstring stretch on 12in step 2x 30"  Hip extension 10x 5" Supine:   Piriformis stretch 2x 30" 90/90 with towel  LTR 5x 10"  03/01/23: Standing:  hip excursions 10X each    RTB shoulder extension    Wall arch 5x  MMT (see above)  Supine: Bridge 10x  Hamstring stretch 1x with hands behind knees, 2x with rope for 30" holds  Piriformis stretch 90/90 2x 30" Quadruped: cat/camel 5x 10" Seated: abdominal sets 10x 5"  Lumbar support   Seated posture  Encouarged to adjust mirror prior leaving parking lot  Rows RTB  Shoulders up back and relax   02/24/23 Standing:  hip excursions 10X each  Seated:  piriformis stretch 2X30"  Hamstring stretch 2X30"  Sit to stands 10X no UE  Long seated hamstring stretch 30" each side Supine: lower trunk rotations 10X5"  Abdominal isometric 10X5"  Bridge 10X  SLR 10X each    02/15/23 physical therapy evaluation and HEP instruction    PATIENT EDUCATION:  Education details: Patient educated on exam findings, POC, scope of PT, HEP, and what to expect next visit. Person educated: Patient Education method: Explanation, Demonstration, and Handouts Education comprehension: verbalized understanding, returned demonstration, verbal cues required, and tactile cues required  HOME EXERCISE PROGRAM: Access Code: [email or web address] URL: [email or web address]  Date: 02/15/2023 Prepared by: [facility] Exercises - Supine Lower Trunk Rotation  - 2 x daily - 7 x weekly - 1 sets - 10 reps - Seated Hamstring Stretch  - 2 x daily - 7 x weekly - 1 sets - 10 reps - 10 sec hold -  Seated Piriformis Stretch with Trunk Bend  - 2 x daily - 7 x weekly - 1 sets - 10 reps - 10 sec hold  Date: 02/24/2023 Prepared by: Emeline Gins Exercises - Seated Table Hamstring Stretch  - 2 x daily - 7 x weekly - 1 sets - 3 reps - 30 sec hold - Sit to Stand Without Arm Support  - 2 x daily - 7 x weekly - 1 sets - 10 reps - Supine Transversus Abdominis Bracing - Hands on Ground  - 2 x daily - 7 x weekly - 1 sets - 10 reps - 5 sec hold - Supine Bridge  - 2 x daily - 7 x weekly - 1 sets - 10 reps - Small Range Straight Leg Raise  - 2 x daily - 7 x weekly - 1 sets - 10 reps  03/03/23- Supine Figure 4 Piriformis Stretch:  - 1 x daily - 7 x weekly - 3 sets - 3 reps - 30" hold  - Seated Transversus Abdominis Bracing  - 1 x daily - 7 x weekly - 3 sets - 10 reps - 5" hold - Standing shoulder flexion wall slides  - 1 x daily - 7 x weekly - 3 sets - 10 reps - 5" hold  ASSESSMENT:  CLINICAL IMPRESSION: Discharge due to change in medical status  OBJECTIVE IMPAIRMENTS: decreased activity tolerance, decreased knowledge of condition, decreased mobility, difficulty walking, decreased ROM, decreased strength, hypomobility, increased fascial restrictions, impaired perceived functional ability, and pain.   ACTIVITY LIMITATIONS: carrying, lifting, bending, sitting, standing, squatting, and locomotion level  PARTICIPATION LIMITATIONS: meal prep, cleaning, laundry, shopping, community activity, yard work, and Freight forwarder POTENTIAL: Good  CLINICAL DECISION MAKING: Stable/uncomplicated  EVALUATION COMPLEXITY: Low   GOALS: Goals reviewed with patient? No  SHORT TERM GOALS: Target date: 03/01/2023  patient will be independent with initial HEP   Baseline: Goal status: IN PROGRESS  2.  Patient will self report 30% improvement to improve tolerance for functional activity  Baseline:  Goal status: IN PROGRESS  LONG TERM GOALS: Target date: 03/15/2023  Patient will be independent in self  management strategies to improve quality of life and functional outcomes.  Baseline:  Goal status: IN PROGRESS  2.  Patient will self report 50% improvement to improve tolerance for functional activity  Baseline:  Goal status: IN PROGRESS  3.  Patient will be able to stand/walk x 15 minutes without back pain > 3/10 to play golf more efficiently Baseline:  Goal status: IN PROGRESS  4.  Patient will improve AROM of lumbar spine all ranges without pain to wfl throughout Baseline:  Goal status: IN PROGRESS  5.  Patient will improve Modified Oswestry score to 10/50 or less  Baseline: 15/50 Goal status: IN PROGRESS  PLAN:  PT FREQUENCY: 2x/week  PT DURATION: 4 weeks  PLANNED INTERVENTIONS: Therapeutic exercises, Therapeutic activity, Neuromuscular re-education, Balance training, Gait training, Patient/Family education, Joint manipulation, Joint mobilization, Stair training, Orthotic/Fit training, DME instructions, Aquatic Therapy, Dry Needling, Electrical stimulation, Spinal manipulation, Spinal mobilization, Cryotherapy, Moist heat, Compression bandaging, scar mobilization, Splintting, Taping, Traction, Ultrasound, Ionotophoresis 4mg /ml Dexamethasone, and Manual therapy .  PLAN FOR NEXT SESSION: discharge 10:32 AM, 03/23/23  Small  MPT [place] [facility] [place] [phone]

## 2023-03-26 ENCOUNTER — Inpatient Hospital Stay: Payer: Medicare Other

## 2023-03-26 DIAGNOSIS — N23 Unspecified renal colic: Secondary | ICD-10-CM | POA: Diagnosis not present

## 2023-03-26 DIAGNOSIS — N2889 Other specified disorders of kidney and ureter: Secondary | ICD-10-CM | POA: Diagnosis not present

## 2023-03-26 DIAGNOSIS — R059 Cough, unspecified: Secondary | ICD-10-CM | POA: Diagnosis not present

## 2023-03-26 DIAGNOSIS — D500 Iron deficiency anemia secondary to blood loss (chronic): Secondary | ICD-10-CM | POA: Diagnosis not present

## 2023-03-26 DIAGNOSIS — R319 Hematuria, unspecified: Secondary | ICD-10-CM | POA: Diagnosis not present

## 2023-03-26 MED ORDER — SODIUM CHLORIDE 0.9 % IV SOLN: INTRAVENOUS | Status: DC

## 2023-03-26 MED ORDER — SODIUM CHLORIDE 0.9 % IV SOLN: Freq: Once | INTRAVENOUS | Status: DC

## 2023-03-26 NOTE — Patient Instructions (Signed)
[facility]  Discharge Instructions: Thank you for choosing [facility] to provide your oncology and hematology care.  If you have a lab appointment with the Cancer Center - please note that after April 8th, 2024, all labs will be drawn in the cancer center.  You do not have to check in or register with the main entrance as you have in the past but will complete your check-in in the cancer center.  Wear comfortable clothing and clothing appropriate for easy access to any Portacath or PICC line.   We strive to give you quality time with your provider. You may need to reschedule your appointment if you arrive late (15 or more minutes).  Arriving late affects you and other patients whose appointments are after yours.  Also, if you miss three or more appointments without notifying the office, you may be dismissed from the clinic at the provider's discretion.      For prescription refill requests, have your pharmacy contact our office and allow 72 hours for refills to be completed.    Today you received 1 Liter of NS today.     BELOW ARE SYMPTOMS THAT SHOULD BE REPORTED IMMEDIATELY: *FEVER GREATER THAN 100.4 F (38 C) OR HIGHER *CHILLS OR SWEATING *NAUSEA AND VOMITING THAT IS NOT CONTROLLED WITH YOUR NAUSEA MEDICATION *UNUSUAL SHORTNESS OF BREATH *UNUSUAL BRUISING OR BLEEDING *URINARY PROBLEMS (pain or burning when urinating, or frequent urination) *BOWEL PROBLEMS (unusual diarrhea, constipation, pain near the anus) TENDERNESS IN MOUTH AND THROAT WITH OR WITHOUT PRESENCE OF ULCERS (sore throat, sores in mouth, or a toothache) UNUSUAL RASH, SWELLING OR PAIN  UNUSUAL VAGINAL DISCHARGE OR ITCHING   Items with * indicate a potential emergency and should be followed up as soon as possible or go to the Emergency Department if any problems should occur.  Please show the CHEMOTHERAPY ALERT CARD or IMMUNOTHERAPY ALERT CARD at check-in to the Emergency Department and  triage nurse.  Should you have questions after your visit or need to cancel or reschedule your appointment, please contact [facility] [phone]  and follow the prompts.  Office hours are 8:00 a.m. to 4:30 p.m. Monday - Friday. Please note that voicemails left after 4:00 p.m. may not be returned until the following business day.  We are closed weekends and major holidays. You have access to a nurse at all times for urgent questions. Please call the main number to the clinic [phone] and follow the prompts.  For any non-urgent questions, you may also contact your provider using MyChart. We now offer e-Visits for anyone 62 and older to request care online for non-urgent symptoms. For details visit [email or web address].   Also download the MyChart app! Go to the app store, search "MyChart", open the app, select [place], and log in with your MyChart username and password.

## 2023-03-26 NOTE — Progress Notes (Signed)
Pt presents today for 1 Liter of NS per provider's order. Vital signs stable and pt voiced no new complaints at this time.  Peripheral IV started with good blood return pre and post infusion.  Treatment given today per MD orders. Tolerated infusion without adverse affects. Vital signs stable. No complaints at this time. Discharged from clinic ambulatory in stable condition. Alert and oriented x 3. F/U with [facility] as scheduled.

## 2023-03-29 DIAGNOSIS — C649 Malignant neoplasm of unspecified kidney, except renal pelvis: Secondary | ICD-10-CM | POA: Diagnosis not present

## 2023-03-29 DIAGNOSIS — Z6834 Body mass index (BMI) 34.0-34.9, adult: Secondary | ICD-10-CM | POA: Diagnosis not present

## 2023-03-29 DIAGNOSIS — E6609 Other obesity due to excess calories: Secondary | ICD-10-CM | POA: Diagnosis not present

## 2023-03-31 ENCOUNTER — Ambulatory Visit (INDEPENDENT_AMBULATORY_CARE_PROVIDER_SITE_OTHER): Payer: Medicare Other

## 2023-03-31 ENCOUNTER — Encounter (HOSPITAL_COMMUNITY)
Admission: RE | Admit: 2023-03-31 | Discharge: 2023-03-31 | Disposition: A | Discharge status: Home / Self Care | Payer: Medicare Other | Source: Ambulatory Visit | Attending: Hematology | Admitting: Hematology

## 2023-03-31 ENCOUNTER — Encounter (HOSPITAL_COMMUNITY): Payer: Self-pay

## 2023-03-31 ENCOUNTER — Encounter
Admission: RE | Admit: 2023-03-31 | Discharge: 2023-03-31 | Disposition: A | Discharge status: Home / Self Care | Payer: Medicare Other | Source: Ambulatory Visit | Attending: Hematology | Admitting: Hematology

## 2023-03-31 DIAGNOSIS — R918 Other nonspecific abnormal finding of lung field: Secondary | ICD-10-CM | POA: Insufficient documentation

## 2023-03-31 DIAGNOSIS — Z01810 Encounter for preprocedural cardiovascular examination: Secondary | ICD-10-CM

## 2023-03-31 DIAGNOSIS — N2889 Other specified disorders of kidney and ureter: Secondary | ICD-10-CM | POA: Diagnosis not present

## 2023-03-31 DIAGNOSIS — C649 Malignant neoplasm of unspecified kidney, except renal pelvis: Secondary | ICD-10-CM | POA: Diagnosis not present

## 2023-03-31 HISTORY — DX: Disorder of kidney and ureter, unspecified: N28.9

## 2023-03-31 MED ORDER — TECHNETIUM TC 99M MEDRONATE IV KIT
20.0000 | PACK | Freq: Once | INTRAVENOUS | Status: AC | PRN
  Administered 2023-03-31: 21.5 via INTRAVENOUS

## 2023-03-31 NOTE — Progress Notes (Signed)
Virtual Visit via Telephone Note   Because of Joshua Gentry's co-morbid illnesses, he is at least at moderate risk for complications without adequate follow up.  This format is felt to be most appropriate for this patient at this time.  The patient did not have access to video technology/had technical difficulties with video requiring transitioning to audio format only (telephone).  All issues noted in this document were discussed and addressed.  No physical exam could be performed with this format.  Please refer to the patient's chart for his consent to telehealth for [facility].  Evaluation Performed:  Preoperative cardiovascular risk assessment _____________   Date:  03/31/2023   Patient ID:  Joshua Gentry, DOB Apr 01, 1946, MRN [id number] Patient Location:  Home Provider location:   Office  Primary Care Provider:  Assunta Found, MD Primary Cardiologist:  Charlton Haws, MD  Chief Complaint / Patient Profile   77 y.o. y/o male with a h/o CAD (status post CABG in 1997, low risk NST in 2019), RBBB, HTN, HLD, and OSA who is pending right radical nephrectomy 04/05/2023 and presents today for telephonic preoperative cardiovascular risk assessment.  History of Present Illness    Joshua Gentry is a 77 y.o. male who presents via audio/video conferencing for a telehealth visit today.  Pt was last seen in cardiology clinic on 01/22/2023 by Randall An, PA-C.  At that time MIA SEEGARS was doing well .  The patient is now pending procedure as outlined above. Since his last visit, he has declined a little in activity due to weakness.  Right now he is able to get around from room to room but does get short of breath with most activities.  He is not doing much walking and is only scored a 3.3 METS on the DASI.  He is currently off aspirin due to bleeding in his urine.  This is how the renal mass was found.  He can remain off the aspirin until after surgery when urology deems it  safe to restart.  Past Medical History    Past Medical History:  Diagnosis Date   Cancer (HCC) 08/17/2004   Leg (Right) Sarcoma   Coronary artery disease    a. s/p CABG 1997.   Heart attack (HCC)    Hyperlipidemia    Hypertension    Obesity (BMI 30-39.9)    OSA on CPAP 10/16/2014   Compliant   RBBB with left anterior fascicular block    Renal insufficiency    Past Surgical History:  Procedure Laterality Date   COLONOSCOPY N/A 08/01/2013   Procedure: COLONOSCOPY;  Surgeon: Dalia Heading, MD;  Location: [facility];  Service: Gastroenterology;  Laterality: N/A;   CORONARY ARTERY BYPASS GRAFT     x4   Lens Bilateral April 2016   Replacment    LOWER LEG SOFT TISSUE TUMOR EXCISION      Allergies  Allergies  Allergen Reactions   Niacin    Penicillins     Patient can't recall reaction- happened a long time ago    Home Medications    Prior to Admission medications   Medication Sig Start Date End Date Taking? Authorizing Provider  calcium-vitamin D 250-100 MG-UNIT per tablet Take 1 tablet by mouth 2 (two) times daily.    [provider]  metoprolol succinate (TOPROL XL) 100 MG 24 hr tablet Take 1 tablet (100 mg total) by mouth daily. Take with or immediately following a meal. 01/22/23   Strader, Lennart Pall,  PA-C  Multiple Vitamins-Minerals (MULTIVITAMIN WITH MINERALS) tablet Take 1 tablet by mouth daily.    [provider]  nitroGLYCERIN (NITROSTAT) 0.4 MG SL tablet DISSOLVE 1 TABLET UNDER THE TONGUE EVERY 5 MINUTES AS NEEDED FOR CHEST PAIN, DO NOT EXCEED A TOTAL OF 3 DOSES IN 15 MINUTES 11/06/22   Wendall Stade, MD  rosuvastatin (CRESTOR) 20 MG tablet TAKE 1 TABLET(20 MG) BY MOUTH DAILY Patient taking differently: Take 20 mg by mouth daily. 12/24/22   Antoine Poche, MD    Physical Exam    Vital Signs:  [place] does not have vital signs available for review today.  Given telephonic nature of communication, physical exam is limited. AAOx3.  NAD. Normal affect.  Speech and respirations are unlabored.  Accessory Clinical Findings    None  Assessment & Plan    1.  Preoperative Cardiovascular Risk Assessment:  Mr. Adu perioperative risk of a major cardiac event is 6.6% according to the Revised Cardiac Risk Index (RCRI).  Therefore, he is at high risk for perioperative complications.   His functional capacity is poor at 3.3 METs according to the [facility] Activity Status Index (DASI). Recommendations: The patient is at high risk for perioperative cardiac complications and is at a low functional capacity.  However, further testing will not change how his cardiac status is managed.  Proceed with surgery at high risk if there are no other options for treatment. Antiplatelet and/or Anticoagulation Recommendations: Patient already stopped aspirin in response to bleeding.    The patient was advised that if he develops new symptoms prior to surgery to contact our office to arrange for a follow-up visit, and he verbalized understanding.   A copy of this note will be routed to requesting surgeon.  Time:   Today, I have spent 10 minutes with the patient with telehealth technology discussing medical history, symptoms, and management plan.     Sharlene Dory, PA-C  03/31/2023, 3:15 PM

## 2023-03-31 NOTE — Patient Instructions (Signed)
Joshua Gentry  03/31/2023     @PREFPERIOPPHARMACY @   Your procedure is scheduled on  04/05/2023.   Report to [facility] at  0915  A.M.   Call this number if you have problems the morning of surgery:  [phone]  If you experience any cold or flu symptoms such as cough, fever, chills, shortness of breath, etc. between now and your scheduled surgery, please notify us at the above number.   Remember:  Do not eat or drink after midnight.      Take these medicines the morning of surgery with A SIP OF WATER                                    metoprolol.    Do not wear jewelry, make-up or nail polish, including gel polish,  artificial nails, or any other type of covering on natural nails (fingers and  toes).  Do not wear lotions, powders, or perfumes, or deodorant.  Do not shave 48 hours prior to surgery.  Men may shave face and neck.  Do not bring valuables to the hospital.  [facility] is not responsible for any belongings or valuables.  Contacts, dentures or bridgework may not be worn into surgery.  Leave your suitcase in the car.  After surgery it may be brought to your room.  For patients admitted to the hospital, discharge time will be determined by your treatment team.  Patients discharged the day of surgery will not be allowed to drive home.    Special instructions:   DO NOT smoke tobacco or vape for 24 hours before your procedure.  Please read over the following fact sheets that you were given. Pain Booklet, Coughing and Deep Breathing, Blood Transfusion Information, Lab Information, Surgical Site Infection Prevention, Anesthesia Post-op Instructions, and Care and Recovery After Surgery        Minimally Invasive Nephrectomy, Care After This sheet gives you information about how to care for yourself after your procedure. Your health care provider may also give you more specific instructions. If you have problems or questions, contact your health care  provider. What can I expect after the procedure? After the procedure, it is common to have: Pain. Soreness and numbness in the area around your incisions. Follow [id number] Anesthesia, Adult, Care After The following information offers guidance on how to care for yourself after your procedure. Your health care provider may also give you more specific instructions. If you have problems or questions, contact your health care provider. What can I expect after the procedure? After the procedure, it is common for people to: Have pain or discomfort at the IV site. Have nausea or vomiting. Have a sore throat or hoarseness. Have trouble concentrating. Feel cold or chills. Feel weak, sleepy, or tired (fatigue). Have soreness and body aches. These can affect parts of the body that were not involved in surgery. Follow these instructions at home: For the time period you were told by your health care provider:  Rest. Do not participate in activities where you could fall or become injured. Do not drive or use machinery. Do not drink alcohol. Do not take sleeping pills or medicines that cause drowsiness. Do not make important decisions or sign legal documents. Do not take care of children on your own. General instructions Drink enough fluid to keep your urine pale yellow. If you have sleep apnea,  surgery and certain medicines can increase your risk for breathing problems. Follow instructions from your health care provider about wearing your sleep device: Anytime you are sleeping, including during daytime naps. While taking prescription pain medicines, sleeping medicines, or medicines that make you drowsy. Return to your normal activities as told by your health care provider. Ask your health care provider what activities are safe for you. Take over-the-counter and prescription medicines only as told by your health care provider. Do not use any  products that contain nicotine or tobacco. These products include cigarettes, chewing tobacco, and vaping devices, such as e-cigarettes. These can delay incision healing after surgery. If you need help quitting, ask your health care provider. Contact a health care provider if: You have nausea or vomiting that does not get better with medicine. You vomit every time you eat or drink. You have pain that does not get better with medicine. You cannot urinate or have bloody urine. You develop a skin rash. You have a fever. Get help right away if: You have trouble breathing. You have chest pain. You vomit blood. These symptoms may be an emergency. Get help right away. Call 911. Do not wait to see if the symptoms will go away. Do not drive yourself to the hospital. Summary After the procedure, it is common to have a sore throat, hoarseness, nausea, vomiting, or to feel weak, sleepy, or fatigue. For the time period you were told by your health care provider, do not drive or use machinery. Get help right away if you have difficulty breathing, have chest pain, or vomit blood. These symptoms may be an emergency. This information is not intended to replace advice given to you by your health care provider. Make sure you discuss any questions you have with your health care provider. Document Revised: 10/31/2021 Document Reviewed: 10/31/2021 Elsevier Patient Education  2024 [employer] How to Use Chlorhexidine Before Surgery Chlorhexidine gluconate (CHG) is a germ-killing (antiseptic) solution that is used to clean the skin. It can get rid of the bacteria that normally live on the skin and can keep them away for about 24 hours. To clean your skin with CHG, you may be given: A CHG solution to use in the shower or as part of a sponge bath. A prepackaged cloth that contains CHG. Cleaning your skin with CHG may help lower the risk for infection: While you are staying in the intensive care unit of the  hospital. If you have a vascular access, such as a central line, to provide short-term or long-term access to your veins. If you have a catheter to drain urine from your bladder. If you are on a ventilator. A ventilator is a machine that helps you breathe by moving air in and out of your lungs. After surgery. What are the risks? Risks of using CHG include: A skin reaction. Hearing loss, if CHG gets in your ears and you have a perforated eardrum. Eye injury, if CHG gets in your eyes and is not rinsed out. The CHG product catching fire. Make sure that you avoid smoking and flames after applying CHG to your skin. Do not use CHG: If you have a chlorhexidine allergy or have previously reacted to chlorhexidine. On babies younger than 78 months of age. How to use CHG solution Use CHG only as told by your health care provider, and follow the instructions on the label. Use the full amount of CHG as directed. Usually, this is one bottle. During a shower Follow these steps  when using CHG solution during a shower (unless your health care provider gives you different instructions): Start the shower. Use your normal soap and shampoo to wash your face and hair. Turn off the shower or move out of the shower stream. Pour the CHG onto a clean washcloth. Do not use any type of brush or rough-edged sponge. Starting at your neck, lather your body down to your toes. Make sure you follow these instructions: If you will be having surgery, pay special attention to the part of your body where you will be having surgery. Scrub this area for at least 1 minute. Do not use CHG on your head or face. If the solution gets into your ears or eyes, rinse them well with water. Avoid your genital area. Avoid any areas of skin that have broken skin, cuts, or scrapes. Scrub your back and under your arms. Make sure to wash skin folds. Let the lather sit on your skin for 1-2 minutes or as long as told by your health care  provider. Thoroughly rinse your entire body in the shower. Make sure that all body creases and crevices are rinsed well. Dry off with a clean towel. Do not put any substances on your body afterward--such as powder, lotion, or perfume--unless you are told to do so by your health care provider. Only use lotions that are recommended by the manufacturer. Put on clean clothes or pajamas. If it is the night before your surgery, sleep in clean sheets.  During a sponge bath Follow these steps when using CHG solution during a sponge bath (unless your health care provider gives you different instructions): Use your normal soap and shampoo to wash your face and hair. Pour the CHG onto a clean washcloth. Starting at your neck, lather your body down to your toes. Make sure you follow these instructions: If you will be having surgery, pay special attention to the part of your body where you will be having surgery. Scrub this area for at least 1 minute. Do not use CHG on your head or face. If the solution gets into your ears or eyes, rinse them well with water. Avoid your genital area. Avoid any areas of skin that have broken skin, cuts, or scrapes. Scrub your back and under your arms. Make sure to wash skin folds. Let the lather sit on your skin for 1-2 minutes or as long as told by your health care provider. Using a different clean, wet washcloth, thoroughly rinse your entire body. Make sure that all body creases and crevices are rinsed well. Dry off with a clean towel. Do not put any substances on your body afterward--such as powder, lotion, or perfume--unless you are told to do so by your health care provider. Only use lotions that are recommended by the manufacturer. Put on clean clothes or pajamas. If it is the night before your surgery, sleep in clean sheets. How to use CHG prepackaged cloths Only use CHG cloths as told by your health care provider, and follow the instructions on the label. Use the  CHG cloth on clean, dry skin. Do not use the CHG cloth on your head or face unless your health care provider tells you to. When washing with the CHG cloth: Avoid your genital area. Avoid any areas of skin that have broken skin, cuts, or scrapes. Before surgery Follow these steps when using a CHG cloth to clean before surgery (unless your health care provider gives you different instructions): Using the CHG cloth, vigorously scrub  the part of your body where you will be having surgery. Scrub using a back-and-forth motion for 3 minutes. The area on your body should be completely wet with CHG when you are done scrubbing. Do not rinse. Discard the cloth and let the area air-dry. Do not put any substances on the area afterward, such as powder, lotion, or perfume. Put on clean clothes or pajamas. If it is the night before your surgery, sleep in clean sheets.  For general bathing Follow these steps when using CHG cloths for general bathing (unless your health care provider gives you different instructions). Use a separate CHG cloth for each area of your body. Make sure you wash between any folds of skin and between your fingers and toes. Wash your body in the following order, switching to a new cloth after each step: The front of your neck, shoulders, and chest. Both of your arms, under your arms, and your hands. Your stomach and groin area, avoiding the genitals. Your right leg and foot. Your left leg and foot. The back of your neck, your back, and your buttocks. Do not rinse. Discard the cloth and let the area air-dry. Do not put any substances on your body afterward--such as powder, lotion, or perfume--unless you are told to do so by your health care provider. Only use lotions that are recommended by the manufacturer. Put on clean clothes or pajamas. Contact a health care provider if: Your skin gets irritated after scrubbing. You have questions about using your solution or cloth. You swallow  any chlorhexidine. Call your local poison control center (986-729-6844 in the [place] Get help right away if: Your eyes itch badly, or they become very red or swollen. Your skin itches badly and is red or swollen. Your hearing changes. You have trouble seeing. You have swelling or tingling in your mouth or throat. You have trouble breathing. These symptoms may represent a serious problem that is an emergency. Do not wait to see if the symptoms will go away. Get medical help right away. Call your local emergency services (911 in the [place] Do not drive yourself to the hospital. Summary Chlorhexidine gluconate (CHG) is a germ-killing (antiseptic) solution that is used to clean the skin. Cleaning your skin with CHG may help to lower your risk for infection. You may be given CHG to use for bathing. It may be in a bottle or in a prepackaged cloth to use on your skin. Carefully follow your health care provider's instructions and the instructions on the product label. Do not use CHG if you have a chlorhexidine allergy. Contact your health care provider if your skin gets irritated after scrubbing. This information is not intended to replace advice given to you by your health care provider. Make sure you discuss any questions you have with your health care provider. Document Revised: 12/01/2021 Document Reviewed: 10/14/2020 Elsevier Patient Education  2023 [employer].

## 2023-04-01 ENCOUNTER — Encounter (HOSPITAL_COMMUNITY)
Admission: RE | Admit: 2023-04-01 | Discharge: 2023-04-01 | Disposition: A | Discharge status: Home / Self Care | Payer: Medicare Other | Source: Ambulatory Visit | Attending: Urology | Admitting: Urology

## 2023-04-01 DIAGNOSIS — Z01818 Encounter for other preprocedural examination: Secondary | ICD-10-CM | POA: Diagnosis present

## 2023-04-01 DIAGNOSIS — Z01812 Encounter for preprocedural laboratory examination: Secondary | ICD-10-CM | POA: Diagnosis not present

## 2023-04-01 LAB — TYPE AND SCREEN
ABO/RH(D): O POS
Antibody Screen: NEGATIVE

## 2023-04-05 ENCOUNTER — Inpatient Hospital Stay (HOSPITAL_COMMUNITY): Payer: Medicare Other | Admitting: Anesthesiology

## 2023-04-05 ENCOUNTER — Inpatient Hospital Stay (HOSPITAL_COMMUNITY): Payer: Medicare Other

## 2023-04-05 ENCOUNTER — Inpatient Hospital Stay (HOSPITAL_COMMUNITY)
Admission: RE | Admit: 2023-04-05 | Discharge: 2023-04-12 | DRG: 656 | Disposition: A | Discharge status: Skilled Nursing Facility | Payer: Medicare Other | Attending: Urology | Admitting: Urology

## 2023-04-05 ENCOUNTER — Encounter (HOSPITAL_COMMUNITY): Payer: Self-pay | Admitting: Urology

## 2023-04-05 ENCOUNTER — Encounter (HOSPITAL_COMMUNITY)
Admission: RE | Disposition: A | Discharge status: Skilled Nursing Facility | Payer: Self-pay | Source: Home / Self Care | Attending: Urology

## 2023-04-05 DIAGNOSIS — Z7401 Bed confinement status: Secondary | ICD-10-CM | POA: Diagnosis not present

## 2023-04-05 DIAGNOSIS — J984 Other disorders of lung: Secondary | ICD-10-CM | POA: Diagnosis present

## 2023-04-05 DIAGNOSIS — Z6833 Body mass index (BMI) 33.0-33.9, adult: Secondary | ICD-10-CM | POA: Diagnosis not present

## 2023-04-05 DIAGNOSIS — R2689 Other abnormalities of gait and mobility: Secondary | ICD-10-CM | POA: Diagnosis present

## 2023-04-05 DIAGNOSIS — N2889 Other specified disorders of kidney and ureter: Secondary | ICD-10-CM

## 2023-04-05 DIAGNOSIS — E8770 Fluid overload, unspecified: Secondary | ICD-10-CM | POA: Diagnosis not present

## 2023-04-05 DIAGNOSIS — J189 Pneumonia, unspecified organism: Secondary | ICD-10-CM | POA: Diagnosis present

## 2023-04-05 DIAGNOSIS — J9601 Acute respiratory failure with hypoxia: Secondary | ICD-10-CM | POA: Diagnosis present

## 2023-04-05 DIAGNOSIS — N1832 Chronic kidney disease, stage 3b: Secondary | ICD-10-CM | POA: Diagnosis present

## 2023-04-05 DIAGNOSIS — I252 Old myocardial infarction: Secondary | ICD-10-CM | POA: Diagnosis not present

## 2023-04-05 DIAGNOSIS — R310 Gross hematuria: Secondary | ICD-10-CM | POA: Diagnosis present

## 2023-04-05 DIAGNOSIS — J449 Chronic obstructive pulmonary disease, unspecified: Secondary | ICD-10-CM | POA: Diagnosis present

## 2023-04-05 DIAGNOSIS — J432 Centrilobular emphysema: Secondary | ICD-10-CM

## 2023-04-05 DIAGNOSIS — I2510 Atherosclerotic heart disease of native coronary artery without angina pectoris: Secondary | ICD-10-CM | POA: Diagnosis present

## 2023-04-05 DIAGNOSIS — G9341 Metabolic encephalopathy: Secondary | ICD-10-CM | POA: Diagnosis not present

## 2023-04-05 DIAGNOSIS — I10 Essential (primary) hypertension: Secondary | ICD-10-CM | POA: Diagnosis not present

## 2023-04-05 DIAGNOSIS — G4733 Obstructive sleep apnea (adult) (pediatric): Secondary | ICD-10-CM | POA: Diagnosis present

## 2023-04-05 DIAGNOSIS — D630 Anemia in neoplastic disease: Secondary | ICD-10-CM | POA: Diagnosis present

## 2023-04-05 DIAGNOSIS — A419 Sepsis, unspecified organism: Secondary | ICD-10-CM | POA: Diagnosis present

## 2023-04-05 DIAGNOSIS — J439 Emphysema, unspecified: Secondary | ICD-10-CM | POA: Diagnosis present

## 2023-04-05 DIAGNOSIS — R918 Other nonspecific abnormal finding of lung field: Secondary | ICD-10-CM | POA: Diagnosis not present

## 2023-04-05 DIAGNOSIS — Z87891 Personal history of nicotine dependence: Secondary | ICD-10-CM

## 2023-04-05 DIAGNOSIS — C7802 Secondary malignant neoplasm of left lung: Secondary | ICD-10-CM | POA: Diagnosis present

## 2023-04-05 DIAGNOSIS — C641 Malignant neoplasm of right kidney, except renal pelvis: Secondary | ICD-10-CM | POA: Diagnosis present

## 2023-04-05 DIAGNOSIS — C7971 Secondary malignant neoplasm of right adrenal gland: Secondary | ICD-10-CM | POA: Diagnosis present

## 2023-04-05 DIAGNOSIS — R0902 Hypoxemia: Secondary | ICD-10-CM | POA: Diagnosis not present

## 2023-04-05 DIAGNOSIS — Z741 Need for assistance with personal care: Secondary | ICD-10-CM | POA: Diagnosis present

## 2023-04-05 DIAGNOSIS — E669 Obesity, unspecified: Secondary | ICD-10-CM | POA: Diagnosis present

## 2023-04-05 DIAGNOSIS — E876 Hypokalemia: Secondary | ICD-10-CM | POA: Diagnosis not present

## 2023-04-05 DIAGNOSIS — R6520 Severe sepsis without septic shock: Secondary | ICD-10-CM | POA: Diagnosis present

## 2023-04-05 DIAGNOSIS — Z888 Allergy status to other drugs, medicaments and biological substances status: Secondary | ICD-10-CM

## 2023-04-05 DIAGNOSIS — I129 Hypertensive chronic kidney disease with stage 1 through stage 4 chronic kidney disease, or unspecified chronic kidney disease: Secondary | ICD-10-CM | POA: Diagnosis present

## 2023-04-05 DIAGNOSIS — R531 Weakness: Secondary | ICD-10-CM | POA: Diagnosis not present

## 2023-04-05 DIAGNOSIS — C7801 Secondary malignant neoplasm of right lung: Secondary | ICD-10-CM | POA: Diagnosis present

## 2023-04-05 DIAGNOSIS — Z1152 Encounter for screening for COVID-19: Secondary | ICD-10-CM

## 2023-04-05 DIAGNOSIS — T501X5A Adverse effect of loop [high-ceiling] diuretics, initial encounter: Secondary | ICD-10-CM | POA: Diagnosis not present

## 2023-04-05 DIAGNOSIS — J90 Pleural effusion, not elsewhere classified: Secondary | ICD-10-CM | POA: Diagnosis not present

## 2023-04-05 DIAGNOSIS — E785 Hyperlipidemia, unspecified: Secondary | ICD-10-CM | POA: Diagnosis present

## 2023-04-05 DIAGNOSIS — I452 Bifascicular block: Secondary | ICD-10-CM | POA: Diagnosis present

## 2023-04-05 DIAGNOSIS — Z951 Presence of aortocoronary bypass graft: Secondary | ICD-10-CM

## 2023-04-05 DIAGNOSIS — F05 Delirium due to known physiological condition: Secondary | ICD-10-CM | POA: Diagnosis not present

## 2023-04-05 DIAGNOSIS — Z8249 Family history of ischemic heart disease and other diseases of the circulatory system: Secondary | ICD-10-CM

## 2023-04-05 DIAGNOSIS — M6281 Muscle weakness (generalized): Secondary | ICD-10-CM | POA: Diagnosis present

## 2023-04-05 DIAGNOSIS — Z880 Allergy status to penicillin: Secondary | ICD-10-CM

## 2023-04-05 DIAGNOSIS — Z79899 Other long term (current) drug therapy: Secondary | ICD-10-CM

## 2023-04-05 HISTORY — PX: ROBOT ASSISTED LAPAROSCOPIC NEPHRECTOMY: SHX5140

## 2023-04-05 LAB — HEPATIC FUNCTION PANEL
ALT: 28 U/L (ref 0–44)
AST: 31 U/L (ref 15–41)
Albumin: 2.5 g/dL — ABNORMAL LOW (ref 3.5–5.0)
Alkaline Phosphatase: 193 U/L — ABNORMAL HIGH (ref 38–126)
Bilirubin, Direct: 0.2 mg/dL (ref 0.0–0.2)
Indirect Bilirubin: 0.6 mg/dL (ref 0.3–0.9)
Total Bilirubin: 0.8 mg/dL (ref 0.3–1.2)
Total Protein: 6 g/dL — ABNORMAL LOW (ref 6.5–8.1)

## 2023-04-05 LAB — BASIC METABOLIC PANEL
Anion gap: 8 (ref 5–15)
BUN: 23 mg/dL (ref 8–23)
CO2: 22 mmol/L (ref 22–32)
Calcium: 12.3 mg/dL — ABNORMAL HIGH (ref 8.9–10.3)
Chloride: 103 mmol/L (ref 98–111)
Creatinine, Ser: 1.51 mg/dL — ABNORMAL HIGH (ref 0.61–1.24)
GFR, Estimated: 48 mL/min — ABNORMAL LOW (ref >60)
Glucose, Bld: 176 mg/dL — ABNORMAL HIGH (ref 70–99)
Potassium: 4.4 mmol/L (ref 3.5–5.1)
Sodium: 133 mmol/L — ABNORMAL LOW (ref 135–145)

## 2023-04-05 LAB — CBC
HCT: 34.1 % — ABNORMAL LOW (ref 39.0–52.0)
Hemoglobin: 10.5 g/dL — ABNORMAL LOW (ref 13.0–17.0)
MCH: 26.9 pg (ref 26.0–34.0)
MCHC: 30.8 g/dL (ref 30.0–36.0)
MCV: 87.4 fL (ref 80.0–100.0)
Platelets: 328 10*3/uL (ref 150–400)
RBC: 3.9 MIL/uL — ABNORMAL LOW (ref 4.22–5.81)
RDW: 15.9 % — ABNORMAL HIGH (ref 11.5–15.5)
WBC: 76.7 10*3/uL (ref 4.0–10.5)
nRBC: 0 % (ref 0.0–0.2)

## 2023-04-05 LAB — BLOOD GAS, ARTERIAL
Acid-Base Excess: 2.3 mmol/L — ABNORMAL HIGH (ref 0.0–2.0)
Bicarbonate: 26.4 mmol/L (ref 20.0–28.0)
Drawn by: 23430
FIO2: 100 %
O2 Saturation: 96.8 %
Patient temperature: 36.7
pCO2 arterial: 38 mmHg (ref 32–48)
pH, Arterial: 7.45 (ref 7.35–7.45)
pO2, Arterial: 73 mmHg — ABNORMAL LOW (ref 83–108)

## 2023-04-05 LAB — SARS CORONAVIRUS 2 BY RT PCR: SARS Coronavirus 2 by RT PCR: NEGATIVE

## 2023-04-05 LAB — PROCALCITONIN: Procalcitonin: 0.88 ng/mL

## 2023-04-05 LAB — HEMOGLOBIN AND HEMATOCRIT, BLOOD
HCT: 31.9 % — ABNORMAL LOW (ref 39.0–52.0)
Hemoglobin: 9.9 g/dL — ABNORMAL LOW (ref 13.0–17.0)

## 2023-04-05 LAB — ABO/RH: ABO/RH(D): O POS

## 2023-04-05 LAB — LACTIC ACID, PLASMA: Lactic Acid, Venous: 1.2 mmol/L (ref 0.5–1.9)

## 2023-04-05 SURGERY — NEPHRECTOMY, RADICAL, ROBOT-ASSISTED, LAPAROSCOPIC, ADULT
Anesthesia: General | Site: Renal | Laterality: Right

## 2023-04-05 MED ORDER — OXYCODONE HCL 5 MG PO TABS: 5.0000 mg | ORAL_TABLET | Freq: Once | ORAL | Status: DC | PRN

## 2023-04-05 MED ORDER — PHENYLEPHRINE 80 MCG/ML (10ML) SYRINGE FOR IV PUSH (FOR BLOOD PRESSURE SUPPORT)
PREFILLED_SYRINGE | INTRAVENOUS | Status: AC
  Filled 2023-04-05: qty 10

## 2023-04-05 MED ORDER — ROSUVASTATIN CALCIUM 20 MG PO TABS
20.0000 mg | ORAL_TABLET | Freq: Every day | ORAL | Status: DC
  Administered 2023-04-06 – 2023-04-12 (×7): 20 mg via ORAL
  Filled 2023-04-05 (×7): qty 1

## 2023-04-05 MED ORDER — ORAL CARE MOUTH RINSE: 15.0000 mL | Freq: Once | OROMUCOSAL | Status: DC

## 2023-04-05 MED ORDER — CEFAZOLIN SODIUM-DEXTROSE 2-4 GM/100ML-% IV SOLN
2.0000 g | INTRAVENOUS | Status: AC
  Administered 2023-04-05: 2 g via INTRAVENOUS

## 2023-04-05 MED ORDER — SODIUM CHLORIDE 0.9 % IV SOLN
2.0000 g | INTRAVENOUS | Status: DC
  Administered 2023-04-05 – 2023-04-09 (×5): 2 g via INTRAVENOUS
  Filled 2023-04-05 (×5): qty 20

## 2023-04-05 MED ORDER — PROPOFOL 10 MG/ML IV BOLUS
INTRAVENOUS | Status: AC
  Filled 2023-04-05: qty 20

## 2023-04-05 MED ORDER — MIDAZOLAM HCL 5 MG/5ML IJ SOLN
INTRAMUSCULAR | Status: DC | PRN
  Administered 2023-04-05: 2 mg via INTRAVENOUS

## 2023-04-05 MED ORDER — SODIUM CHLORIDE 0.9 % IV SOLN: INTRAVENOUS | Status: DC

## 2023-04-05 MED ORDER — DIPHENHYDRAMINE HCL 12.5 MG/5ML PO ELIX
12.5000 mg | ORAL_SOLUTION | Freq: Four times a day (QID) | ORAL | Status: DC | PRN
  Administered 2023-04-08: 12.5 mg via ORAL
  Filled 2023-04-05: qty 5

## 2023-04-05 MED ORDER — SUGAMMADEX SODIUM 200 MG/2ML IV SOLN
INTRAVENOUS | Status: DC | PRN
  Administered 2023-04-05: 250 mg via INTRAVENOUS

## 2023-04-05 MED ORDER — GUAIFENESIN-DM 100-10 MG/5ML PO SYRP
15.0000 mL | ORAL_SOLUTION | Freq: Three times a day (TID) | ORAL | Status: AC
  Administered 2023-04-05 – 2023-04-06 (×3): 15 mL via ORAL
  Filled 2023-04-05 (×3): qty 15

## 2023-04-05 MED ORDER — CEFAZOLIN SODIUM-DEXTROSE 2-4 GM/100ML-% IV SOLN: 2.0000 g | Freq: Three times a day (TID) | INTRAVENOUS | Status: DC

## 2023-04-05 MED ORDER — HYDROMORPHONE HCL 1 MG/ML IJ SOLN: 0.5000 mg | INTRAMUSCULAR | Status: DC | PRN

## 2023-04-05 MED ORDER — OXYCODONE HCL 5 MG/5ML PO SOLN: 5.0000 mg | Freq: Once | ORAL | Status: DC | PRN

## 2023-04-05 MED ORDER — ZOLEDRONIC ACID 4 MG/100ML IV SOLN
4.0000 mg | Freq: Once | INTRAVENOUS | Status: DC
  Filled 2023-04-05: qty 100

## 2023-04-05 MED ORDER — CEFAZOLIN SODIUM-DEXTROSE 2-4 GM/100ML-% IV SOLN
INTRAVENOUS | Status: AC
  Filled 2023-04-05: qty 100

## 2023-04-05 MED ORDER — STERILE WATER FOR IRRIGATION IR SOLN
Status: DC | PRN
  Administered 2023-04-05: 1000 mL

## 2023-04-05 MED ORDER — FENTANYL CITRATE (PF) 100 MCG/2ML IJ SOLN
INTRAMUSCULAR | Status: AC
  Filled 2023-04-05: qty 2

## 2023-04-05 MED ORDER — FENTANYL CITRATE (PF) 100 MCG/2ML IJ SOLN
INTRAMUSCULAR | Status: DC | PRN
  Administered 2023-04-05 (×3): 50 ug via INTRAVENOUS

## 2023-04-05 MED ORDER — ONDANSETRON HCL 4 MG/2ML IJ SOLN: 4.0000 mg | INTRAMUSCULAR | Status: DC | PRN

## 2023-04-05 MED ORDER — SODIUM CHLORIDE 0.9 % IV BOLUS
1000.0000 mL | Freq: Once | INTRAVENOUS | Status: AC
  Administered 2023-04-05: 1000 mL via INTRAVENOUS

## 2023-04-05 MED ORDER — CEFAZOLIN SODIUM-DEXTROSE 2-4 GM/100ML-% IV SOLN
INTRAVENOUS | Status: AC
  Administered 2023-04-05: 2000 mg
  Filled 2023-04-05: qty 100

## 2023-04-05 MED ORDER — IPRATROPIUM-ALBUTEROL 0.5-2.5 (3) MG/3ML IN SOLN: 3.0000 mL | RESPIRATORY_TRACT | Status: DC | PRN

## 2023-04-05 MED ORDER — CHLORHEXIDINE GLUCONATE 0.12 % MT SOLN: 15.0000 mL | Freq: Once | OROMUCOSAL | Status: DC

## 2023-04-05 MED ORDER — SODIUM CHLORIDE 0.9 % IR SOLN
Status: DC | PRN
  Administered 2023-04-05: 3000 mL

## 2023-04-05 MED ORDER — METOPROLOL SUCCINATE ER 50 MG PO TB24
100.0000 mg | ORAL_TABLET | Freq: Every day | ORAL | Status: DC
  Administered 2023-04-06 – 2023-04-12 (×7): 100 mg via ORAL
  Filled 2023-04-05 (×7): qty 2

## 2023-04-05 MED ORDER — ONDANSETRON HCL 4 MG/2ML IJ SOLN: 4.0000 mg | Freq: Once | INTRAMUSCULAR | Status: DC | PRN

## 2023-04-05 MED ORDER — HEMOSTATIC AGENTS (NO CHARGE) OPTIME
TOPICAL | Status: DC | PRN
  Administered 2023-04-05: 1 via TOPICAL

## 2023-04-05 MED ORDER — BUPIVACAINE LIPOSOME 1.3 % IJ SUSP
INTRAMUSCULAR | Status: DC | PRN
  Administered 2023-04-05: 20 mL

## 2023-04-05 MED ORDER — LACTATED RINGERS IV SOLN: INTRAVENOUS | Status: DC

## 2023-04-05 MED ORDER — PHENYLEPHRINE 80 MCG/ML (10ML) SYRINGE FOR IV PUSH (FOR BLOOD PRESSURE SUPPORT)
PREFILLED_SYRINGE | INTRAVENOUS | Status: DC | PRN
  Administered 2023-04-05 (×3): 160 ug via INTRAVENOUS

## 2023-04-05 MED ORDER — PROPOFOL 10 MG/ML IV BOLUS
INTRAVENOUS | Status: DC | PRN
Start: 2023-04-05 — End: 2023-04-05
  Administered 2023-04-05: 130 mg via INTRAVENOUS

## 2023-04-05 MED ORDER — ZOLPIDEM TARTRATE 5 MG PO TABS: 5.0000 mg | ORAL_TABLET | Freq: Every evening | ORAL | Status: DC | PRN

## 2023-04-05 MED ORDER — DEXAMETHASONE SODIUM PHOSPHATE 10 MG/ML IJ SOLN
INTRAMUSCULAR | Status: DC | PRN
  Administered 2023-04-05: 10 mg via INTRAVENOUS

## 2023-04-05 MED ORDER — DIPHENHYDRAMINE HCL 50 MG/ML IJ SOLN: 12.5000 mg | Freq: Four times a day (QID) | INTRAMUSCULAR | Status: DC | PRN

## 2023-04-05 MED ORDER — CHLORHEXIDINE GLUCONATE CLOTH 2 % EX PADS
6.0000 | MEDICATED_PAD | Freq: Every day | CUTANEOUS | Status: DC
  Administered 2023-04-06 – 2023-04-12 (×8): 6 via TOPICAL

## 2023-04-05 MED ORDER — FENTANYL CITRATE PF 50 MCG/ML IJ SOSY
25.0000 ug | PREFILLED_SYRINGE | INTRAMUSCULAR | Status: DC | PRN
  Administered 2023-04-05: 50 ug via INTRAVENOUS
  Filled 2023-04-05: qty 1

## 2023-04-05 MED ORDER — ONDANSETRON HCL 4 MG/2ML IJ SOLN
INTRAMUSCULAR | Status: DC | PRN
Start: 2023-04-05 — End: 2023-04-05
  Administered 2023-04-05: 4 mg via INTRAVENOUS

## 2023-04-05 MED ORDER — ROCURONIUM 10MG/ML (10ML) SYRINGE FOR MEDFUSION PUMP - OPTIME
INTRAVENOUS | Status: DC | PRN
  Administered 2023-04-05: 20 mg via INTRAVENOUS
  Administered 2023-04-05: 70 mg via INTRAVENOUS
  Administered 2023-04-05: 10 mg via INTRAVENOUS
  Administered 2023-04-05: 20 mg via INTRAVENOUS

## 2023-04-05 MED ORDER — ACETAMINOPHEN 325 MG PO TABS: 650.0000 mg | ORAL_TABLET | ORAL | Status: DC | PRN

## 2023-04-05 MED ORDER — SODIUM CHLORIDE 0.9 % IV SOLN
500.0000 mg | INTRAVENOUS | Status: DC
  Administered 2023-04-05 – 2023-04-09 (×5): 500 mg via INTRAVENOUS
  Filled 2023-04-05 (×5): qty 5

## 2023-04-05 MED ORDER — SENNOSIDES-DOCUSATE SODIUM 8.6-50 MG PO TABS
2.0000 | ORAL_TABLET | Freq: Every day | ORAL | Status: DC
  Administered 2023-04-05 – 2023-04-09 (×5): 2 via ORAL
  Filled 2023-04-05 (×6): qty 2

## 2023-04-05 MED ORDER — MIDAZOLAM HCL 2 MG/2ML IJ SOLN
INTRAMUSCULAR | Status: AC
  Filled 2023-04-05: qty 2

## 2023-04-05 MED ORDER — LIDOCAINE HCL (CARDIAC) PF 50 MG/5ML IV SOSY
PREFILLED_SYRINGE | INTRAVENOUS | Status: DC | PRN
  Administered 2023-04-05: 100 mg via INTRAVENOUS

## 2023-04-05 MED ORDER — IPRATROPIUM-ALBUTEROL 0.5-2.5 (3) MG/3ML IN SOLN
3.0000 mL | Freq: Once | RESPIRATORY_TRACT | Status: AC
  Administered 2023-04-05: 3 mL via RESPIRATORY_TRACT
  Filled 2023-04-05: qty 3

## 2023-04-05 MED ORDER — DEXMEDETOMIDINE HCL IN NACL 80 MCG/20ML IV SOLN
INTRAVENOUS | Status: AC
  Filled 2023-04-05: qty 20

## 2023-04-05 MED ORDER — OXYCODONE HCL 5 MG PO TABS
5.0000 mg | ORAL_TABLET | ORAL | Status: DC | PRN
  Administered 2023-04-05 – 2023-04-08 (×5): 5 mg via ORAL
  Filled 2023-04-05 (×5): qty 1

## 2023-04-05 SURGICAL SUPPLY — 57 items
ADH SKN CLS APL DERMABOND .7 (GAUZE/BANDAGES/DRESSINGS) ×1
APL PRP STRL LF DISP 70% ISPRP (MISCELLANEOUS) ×1
BAG LAPAROSCOPIC 12 15 PORT 16 (BASKET) ×1 IMPLANT
BAG RETRIEVAL 12/15 (BASKET) ×1
BLADE SURG 15 STRL LF DISP TIS (BLADE) ×1 IMPLANT
BLADE SURG 15 STRL SS (BLADE) ×1
CANNULA REDUCER 12-8 DVNC XI (CANNULA) ×1 IMPLANT
CHLORAPREP W/TINT 26 (MISCELLANEOUS) ×1 IMPLANT
CLIP LIGATING HEM O LOK PURPLE (MISCELLANEOUS) ×2 IMPLANT
COVER LIGHT HANDLE STERIS (MISCELLANEOUS) ×1 IMPLANT
COVER MAYO STAND XLG (MISCELLANEOUS) ×1 IMPLANT
COVER TIP SHEARS 8 DVNC (MISCELLANEOUS) ×1 IMPLANT
DERMABOND ADVANCED .7 DNX12 (GAUZE/BANDAGES/DRESSINGS) ×1 IMPLANT
DRAPE ARM DVNC X/XI (DISPOSABLE) ×4 IMPLANT
DRAPE COLUMN DVNC XI (DISPOSABLE) ×1 IMPLANT
DRAPE HALF SHEET 40X57 (DRAPES) ×1 IMPLANT
DRAPE INCISE IOBAN 66X45 STRL (DRAPES) ×1 IMPLANT
DRSG TEGADERM 4X4.75 (GAUZE/BANDAGES/DRESSINGS) IMPLANT
ELECT PENCIL ROCKER SW 15FT (MISCELLANEOUS) ×1 IMPLANT
ELECT REM PT RETURN 15FT ADLT (MISCELLANEOUS) ×1 IMPLANT
FORCEPS BPLR FENES DVNC XI (FORCEP) ×1 IMPLANT
FORCEPS PROGRASP DVNC XI (FORCEP) ×1 IMPLANT
GAUZE SPONGE 4X4 12PLY STRL (GAUZE/BANDAGES/DRESSINGS) IMPLANT
GLOVE BIO SURGEON STRL SZ8 (GLOVE) ×2 IMPLANT
GLOVE BIOGEL PI IND STRL 7.0 (GLOVE) ×2 IMPLANT
GLOVE BIOGEL PI IND STRL 8 (GLOVE) ×2 IMPLANT
GOWN STRL REUS W/TWL LRG LVL3 (GOWN DISPOSABLE) ×2 IMPLANT
GOWN STRL REUS W/TWL XL LVL3 (GOWN DISPOSABLE) ×2 IMPLANT
HEMOSTAT SURGICEL 4X8 (HEMOSTASIS) IMPLANT
IRRIG SUCT STRYKERFLOW 2 WTIP (MISCELLANEOUS) ×1
IRRIGATION SUCT STRKRFLW 2 WTP (MISCELLANEOUS) ×1 IMPLANT
IV NS IRRIG 3000ML ARTHROMATIC (IV SOLUTION) IMPLANT
KIT TURNOVER KIT A (KITS) IMPLANT
NDL HYPO 21X1.5 SAFETY (NEEDLE) ×1 IMPLANT
NDL INSUFFLATION 14GA 120MM (NEEDLE) ×1 IMPLANT
NEEDLE HYPO 21X1.5 SAFETY (NEEDLE) ×1
NEEDLE INSUFFLATION 14GA 120MM (NEEDLE) ×1
PACK LAP CHOLE LZT030E (CUSTOM PROCEDURE TRAY) ×1 IMPLANT
PAD ARMBOARD 7.5X6 YLW CONV (MISCELLANEOUS) ×3 IMPLANT
POSITIONER HEAD 8X9X4 ADT (SOFTGOODS) ×1 IMPLANT
RELOAD STAPLE 45 2.5 WHT DVNC (STAPLE) IMPLANT
SCISSORS MNPLR CVD DVNC XI (INSTRUMENTS) ×1 IMPLANT
SEAL UNIV 5-12 XI (MISCELLANEOUS) ×3 IMPLANT
SET BASIN LINEN APH (SET/KITS/TRAYS/PACK) ×1 IMPLANT
SET TUBE DA VINCI INSUFFLATOR (TUBING) IMPLANT
SLEEVE Z-THREAD 5X100MM (TROCAR) ×1 IMPLANT
STAPLER 45 SUREFORM DVNC (STAPLE) ×1 IMPLANT
STAPLER CANNULA SEAL DVNC XI (STAPLE) ×1 IMPLANT
STAPLER RELOAD 2.5X45 WHT DVNC (STAPLE) ×3
STAPLER VISISTAT (STAPLE) IMPLANT
SUT PDS AB 0 CTX 60 (SUTURE) ×1 IMPLANT
SUT VIC AB 2-0 SH 27 (SUTURE) ×1
SUT VIC AB 2-0 SH 27X BRD (SUTURE) ×1 IMPLANT
SYR 20ML LL LF (SYRINGE) ×2 IMPLANT
TRAY FOLEY MTR SLVR 16FR STAT (SET/KITS/TRAYS/PACK) ×1 IMPLANT
TROCAR Z-THREAD FIOS 5X100MM (TROCAR) ×1 IMPLANT
WATER STERILE IRR 1000ML POUR (IV SOLUTION) ×1 IMPLANT

## 2023-04-05 NOTE — Consult Note (Addendum)
Initial Consultation Note   Patient: Joshua Gentry GNF:[id number] DOB: [birth year] PCP: Assunta Found, MD DOA: 04/05/2023 DOS: the patient was seen and examined on 04/05/2023 Primary service: Malen Gauze, MD  Referring physician: Dr. Ronne Binning  Reason for consult: Acute Hypoxic respiratory failure  Assessment/Plan:  Assessment and Plan: * Right renal mass Presumed metastatic right kidney cancer.  Status post XI robotic assisted laparoscopic right nephrectomy, by Dr. Ronne Binning.  Follows with Dr. Ellin Saba, soon to discuss final diagnosis and treatment plan.  CAP (community acquired pneumonia) Presenting with dyspnea, hypoxia, chronic cough.  Rules in for severe sepsis with tachypnea, respiratory rate 27-32, and significant worsening leukocytosis at 76.7, with evidence of endorgan dysfunction- acute respiratory failure.  Chest x-ray with increased confluent density of the left lung base concerning for pneumonia or atelectasis.  Also new small bilateral pleural effusions L > R. -IV ceftriaxone and azithromycin -Check QTc on EKG -Mucolytic's, DuoNebs -Check lactic acid -Check COVID - N/s 75cc/hr   Acute hypoxic respiratory failure (HCC) Status post XI robotic assisted laparoscopic right nephrectomy.  Post-op patient, hypoxic-sats down to 89% on 4 L, currently on nonrebreather, sats 95%.  He has had chronic dyspnea, over the past several years that has progressed, but over the past several days to weeks, patient has been increasingly short of breath with minimal activity.  Prior to admission, sats will drop to high 80s with activity.  Mental status intact. Quit smoking cigarettes about 27 years ago. -  Chest x-ray suggesting pneumonia versus atelectasis, and advanced emphysema-seen on recent CT.   -ABG shows pH of 7.45, pCO2 of 38, pO2 of 73. - Respiratory protocol -May need home O2 on discharge  Hypercalcemia Hypercalcemia 12.3, in the setting of presumed metastatic kidney cancer.   Mental status intact.  Bone scan 03/31/2023, results pending. - 1L bolus, trend for now -Check albumin   COPD (chronic obstructive pulmonary disease) (HCC) No wheezing at this time.  Quit smoking cigarettes about 27 years ago.  Chronic dyspnea.  Not on home O2.  Advanced centrilobular emphysematous lung changes greatest in the upper lung zones, with left apical pleural blebs, scarring and fibrotic changes, seen on recent CT 03/16/2023. -DuoNebs as needed  OSA on CPAP Resume CPAP  Essential hypertension Stable. -Resume metoprolol   TRH will continue to follow the patient.  HPI: Joshua Gentry is a 77 y.o. male with past medical history of adrenal mass, coronary artery disease, OSA on CPAP.  Patient was diagnosed with renal mass, after he presented abnormal labs, gross hematuria and AKI and was found to have new metastatic disease- 03/16/23.  Followed up with nephrology on discharge, and underwent nephrectomy today 04/05/23.  Hospitalist was consulted today after surgery due to patient being hypoxic, requiring nonrebreather.  He required general anesthesia and was intubated for the procedure.  Review of Systems: As mentioned in the history of present illness. All other systems reviewed and are negative. 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 s/p CABG 1997.   Heart attack (HCC)    Hyperlipidemia    Hypertension    Obesity (BMI 30-39.9)    OSA on CPAP 10/16/2014   Compliant   RBBB with left anterior fascicular block    Renal insufficiency    Past Surgical History:  Procedure Laterality Date   COLONOSCOPY N/A 08/01/2013   Procedure: COLONOSCOPY;  Surgeon: Dalia Heading, MD;  Location: [facility];  Service: Gastroenterology;  Laterality: N/A;   CORONARY ARTERY BYPASS GRAFT     x4   Lens Bilateral April 2016   Replacment    LOWER LEG SOFT TISSUE TUMOR EXCISION     Social History:  reports that  he quit smoking about 26 years ago. His smoking use included cigarettes. He started smoking about 58 years ago. He has a 48 pack-year smoking history. He has never used smokeless tobacco. He reports that he does not currently use alcohol. He reports that he does not use drugs.  Allergies  Allergen Reactions   Niacin    Penicillins     Patient can't recall reaction- happened a long time ago    Family History  Problem Relation Age of Onset   Hypertension Mother    Diabetes Mother    Heart attack Father    Diabetes Brother     Prior to Admission medications   Medication Sig Start Date End Date Taking? Authorizing Provider  calcium-vitamin D 250-100 MG-UNIT per tablet Take 1 tablet by mouth 2 (two) times daily.   Yes [provider]  metoprolol succinate (TOPROL XL) 100 MG 24 hr tablet Take 1 tablet (100 mg total) by mouth daily. Take with or immediately following a meal. 01/22/23  Yes Strader, [place] M, PA-C  Multiple Vitamins-Minerals (MULTIVITAMIN WITH MINERALS) tablet Take 1 tablet by mouth daily.   Yes [provider]  nitroGLYCERIN (NITROSTAT) 0.4 MG SL tablet DISSOLVE 1 TABLET UNDER THE TONGUE EVERY 5 MINUTES AS NEEDED FOR CHEST PAIN, DO NOT EXCEED A TOTAL OF 3 DOSES IN 15 MINUTES 11/06/22   Wendall Stade, MD  rosuvastatin (CRESTOR) 20 MG tablet TAKE 1 TABLET(20 MG) BY MOUTH DAILY Patient taking differently: Take 20 mg by mouth daily. 12/24/22   Antoine Poche, MD    Physical Exam: Vitals:   04/05/23 1645 04/05/23 1650 04/05/23 1700 04/05/23 1715  BP: 137/68  135/61 132/65  Pulse: 73 76 74 76  Resp: (!) 28 (!) 28 (!) 26 (!) 27  Temp:    97.6 F (36.4 C)  TempSrc:      SpO2: 95% 95% 96% 95%  Weight:      Height:        Constitutional: NAD, calm, comfortable Eyes: PERRL, lids and conjunctivae normal ENMT: Mucous membranes are moist. Neck: normal, supple, no masses, no thyromegaly Respiratory: On nonrebreather, clear to auscultation bilaterally, no  wheezing, no crackles.  Mild to moderate increased work of breathing. No accessory muscle use.  Cardiovascular: Regular rate and rhythm, no murmurs / rubs / gallops. No extremity edema.  Extremities warm.   Abdomen:.  Post Op-Clean dressings to abdomen, palpation, abdomen soft   Musculoskeletal: no clubbing / cyanosis. No joint deformity upper and lower extremities.  Skin: no rashes, lesions, ulcers. No induration Neurologic: No facial asymmetry, speech fluent, moving extremities spontaneously.  Psychiatric: Normal judgment and insight. Alert and oriented x 3. Normal mood.   Data Reviewed:  WBC seven 6.7. Hemoglobin 10.5. Calcium 12.3.  DVT prophylaxis: SCDs, Will defer to urology Family Communication: Spouse, and son Jonny Ruiz at bedside.  Thank you very much for involving [place] in the care of your patient.  Author: Onnie Boer, MD 04/05/2023 7:48 PM  For on call review [email or web address].

## 2023-04-05 NOTE — Assessment & Plan Note (Addendum)
Presenting with dyspnea, hypoxia, chronic cough.  Rules in for severe sepsis with tachypnea, respiratory rate 27-32, and significant worsening leukocytosis at 76.7, with evidence of endorgan dysfunction- acute respiratory failure.  Chest x-ray with increased confluent density of the left lung base concerning for pneumonia or atelectasis.  Also new small bilateral pleural effusions L > R. -IV ceftriaxone and azithromycin -Check QTc on EKG -Mucolytic's, DuoNebs -Check lactic acid -Check COVID - N/s 75cc/hr

## 2023-04-05 NOTE — Progress Notes (Signed)
Patient has home CPAP with him.  I offered to set it up for him while he was getting his Duoneb treatment, but patient stated he did not want to wear it tonight.  Left it in bag and told patient I would be by again tomorrow night and we could set it up then.

## 2023-04-05 NOTE — Transfer of Care (Signed)
Immediate Anesthesia Transfer of Care Note  Patient: Joshua Gentry  Procedure(s) Performed: XI ROBOTIC ASSISTED LAPAROSCOPIC NEPHRECTOMY (Right: Renal)  Patient Location: PACU  Anesthesia Type:General  Level of Consciousness: awake and alert   Airway & Oxygen Therapy: Patient Spontanous Breathing and Patient connected to face mask oxygen  Post-op Assessment: Report given to RN and Post -op Vital signs reviewed and stable  Post vital signs: Reviewed and stable  Last Vitals:  Vitals Value Taken Time  BP 154/137 04/05/23 1550  Temp    Pulse 80 04/05/23 1552  Resp 31 04/05/23 1552  SpO2 91 % 04/05/23 1552  Vitals shown include unfiled device data.  Last Pain:  Vitals:   04/05/23 1007  TempSrc: Oral  PainSc: 0-No pain         Complications: No notable events documented.

## 2023-04-05 NOTE — Interval H&P Note (Signed)
History and Physical Interval Note:  04/05/2023 11:33 AM  Joshua Gentry  has presented today for surgery, with the diagnosis of kidney mass.  The various methods of treatment have been discussed with the patient and family. After consideration of risks, benefits and other options for treatment, the patient has consented to  Procedure(s): XI ROBOTIC ASSISTED LAPAROSCOPIC NEPHRECTOMY (Right) as a surgical intervention.  The patient's history has been reviewed, patient examined, no change in status, stable for surgery.  I have reviewed the patient's chart and labs.  Questions were answered to the patient's satisfaction.     Wilkie Aye

## 2023-04-05 NOTE — Assessment & Plan Note (Signed)
Presumed metastatic right kidney cancer.  Status post XI robotic assisted laparoscopic right nephrectomy, by Dr. Ronne Binning.  Follows with Dr. Ellin Saba, soon to discuss final diagnosis and treatment plan.

## 2023-04-05 NOTE — Assessment & Plan Note (Addendum)
-  Status post XI robotic assisted laparoscopic right nephrectomy.  Post-op patient, hypoxic-sats down to 89% on 4 L, at time of initial consultation requiring nonrebreather to keep sats above 92%.   -Per family he has had chronic dyspnea, over the past several years that has progressed, but over the past several days/weeks, patient has been increasingly short of breath with minimal activity.  Prior to admission, sats will drop to high 80s with activity.  Mental status intact. Quit smoking cigarettes about 27 years ago. -CT scan/chest x-ray suggesting left lower lobe pneumonia versus atelectasis with chronic emphysematous changes -No fever, WBCs up to 122.6K and patient experiencing tachypnea with activity. -Elevated WBCs could also be now responsible secondary to the use of his steroids. -Will check CBC with differential in AM. -Case discussed with Dr. Ellin Saba. -Continue therapy with antibiotics, steroids (starting slow tapering) and nebulizer management -Continue to wean of oxygen supplementation as tolerated. -Patient May need home O2 on discharge -Continue supportive care.

## 2023-04-05 NOTE — Plan of Care (Signed)
  Problem: Clinical Measurements: Goal: Respiratory complications will improve Outcome: Progressing   Problem: Coping: Goal: Level of anxiety will decrease Outcome: Progressing   Problem: Elimination: Goal: Will not experience complications related to urinary retention Outcome: Progressing   Problem: Pain Managment: Goal: General experience of comfort will improve Outcome: Progressing   

## 2023-04-05 NOTE — Assessment & Plan Note (Signed)
No wheezing at this time.  Quit smoking cigarettes about 27 years ago.  Chronic dyspnea.  Not on home O2.  Advanced centrilobular emphysematous lung changes greatest in the upper lung zones, with left apical pleural blebs, scarring and fibrotic changes, seen on recent CT 03/16/2023. -DuoNebs as needed

## 2023-04-05 NOTE — Anesthesia Preprocedure Evaluation (Signed)
Anesthesia Evaluation  Patient identified by MRN, date of birth, ID band Patient awake    Reviewed: Allergy & Precautions, H&P , NPO status , Patient's Chart, lab work & pertinent test results, reviewed documented beta blocker date and time   Airway Mallampati: II  TM Distance: >3 FB Neck ROM: full    Dental no notable dental hx.    Pulmonary neg pulmonary ROS, sleep apnea , former smoker   Pulmonary exam normal breath sounds clear to auscultation       Cardiovascular Exercise Tolerance: Good hypertension, + CAD and + Past MI  negative cardio ROS + dysrhythmias  Rhythm:regular Rate:Normal     Neuro/Psych negative neurological ROS  negative psych ROS   GI/Hepatic negative GI ROS, Neg liver ROS,,,  Endo/Other  negative endocrine ROS    Renal/GU Renal diseasenegative Renal ROS  negative genitourinary   Musculoskeletal   Abdominal   Peds  Hematology negative hematology ROS (+) Blood dyscrasia, anemia   Anesthesia Other Findings   Reproductive/Obstetrics negative OB ROS                             Anesthesia Physical Anesthesia Plan  ASA: 3  Anesthesia Plan: General and General ETT   Post-op Pain Management:    Induction:   PONV Risk Score and Plan: Ondansetron  Airway Management Planned:   Additional Equipment:   Intra-op Plan:   Post-operative Plan:   Informed Consent: I have reviewed the patients History and Physical, chart, labs and discussed the procedure including the risks, benefits and alternatives for the proposed anesthesia with the patient or authorized representative who has indicated his/her understanding and acceptance.     Dental Advisory Given  Plan Discussed with: CRNA  Anesthesia Plan Comments:        Anesthesia Quick Evaluation

## 2023-04-05 NOTE — Assessment & Plan Note (Signed)
Stable. -Resume metoprolol 

## 2023-04-05 NOTE — Assessment & Plan Note (Signed)
-   Resume CPAP ?

## 2023-04-05 NOTE — Op Note (Signed)
Preoperative diagnosis: Right renal mass  Postop diagnosis: Same  Procedure: 1.  right robot assisted laparoscopic radical nephrectomy  Attending: Wilkie Aye  Anesthesia: General  Estimated blood loss: 100 cc  Drains: 16 French Foley catheter  Specimens: right radical nephrectomy  Antibiotics: ancef  Findings: 2 artery and 2 vein  Indications: Patient is a 77 year old with a history of 7 cm right renal mass.  The mass was not amenable to partial nephrectomy. He was having gross hematuria. After discussing treatment options patient decided to proceed with right robot assisted laparoscopic radical nephrectomy.  Procedure in detail: Prior to procedure consent was obtained. Patient was brought to the operating room and briefing was done sure correct patient, correct procedure, correct site.  General anesthesia was in administered patient was placed in the left lateral decubitus position.  a 16 French catheter was placed. their abdomen and flank was then prepped and draped usual sterile fashion.  A Veress needle was used to obtain pneumoperitoneum.  Once pneumoperitoneum was reestablished to 15 mmHg we then placed a 12 mm camera port lateral to the umbilicus at the lateral edge of rectus.  We then proceeded to place 3 more robotic ports. We then placed an assistant port inferior to the camera in the midline. We then placed a 5mm port for the liver retractor in the midline above the assistant port. We then docked the robot.  We then dissected along the white line of Toldt.  We then reflected the colon medially.  We then proceeded to kocherize the duodenum. We then identified the psoas muscle.  Once this was done we traced it down to the iliac vessels and identified the ureter.  Once we identified the gonadal vein and ureter were then traced this to the renal hilum.  The renal vein and renal artery were skeletonized.  We did we identified one renal vein one renal artery.  Using the [employer] power  stapler within ligated the renal artery.  Once this was done we then used a second staple load to ligate the renal vein.  We then used a hem-o-lock clips to ligate the gonadal vein and the ureter.  Once this was done we then freed the kidney from its lateral and posterior attachments.  We then used a Endo Catch bag to remove the specimen.  Once the specimen was in the Endo Catch bag we then inspected the retroperitoneum and noted no residual bleeding.  We then removed our instruments, undocked the robot, and released the pneumoperitoneum.  We then made a right lower quardant incision to remove the specimen.  Once the specimen was removed we then closed the camera and assistant ports with 0 Vicryl in interrupted fashion.  We then closed the  Right lower quadrant incision with 0 PDS in a running fashion.  We then closed the overlying skin with 2-0 Vicryl in running fashion.  The skin was then closed with staples.  This then concluded the procedure which was well tolerated by the patient.  Complications: None  Condition: Stable, x-rayed, transferred to PACU.  Plan: Patient is to be admitted for inpatient stay. The foley catheter will be removed in the morning. They will be started on a clear liquid diet POD#1

## 2023-04-05 NOTE — Anesthesia Procedure Notes (Signed)
Procedure Name: Intubation Date/Time: 04/05/2023 12:43 PM  Performed by: Moshe Salisbury, CRNAPre-anesthesia Checklist: Patient identified, Patient being monitored, Timeout performed, Emergency Drugs available and Suction available Patient Re-evaluated:Patient Re-evaluated prior to induction Oxygen Delivery Method: Circle system utilized Preoxygenation: Pre-oxygenation with 100% oxygen Induction Type: IV induction Ventilation: Mask ventilation without difficulty Laryngoscope Size: Mac and 3 Grade View: Grade I Tube type: Oral Tube size: 8.0 mm Number of attempts: 1 Airway Equipment and Method: Stylet Placement Confirmation: ETT inserted through vocal cords under direct vision, positive ETCO2 and breath sounds checked- equal and bilateral Secured at: 23 cm Tube secured with: Tape Dental Injury: Teeth and Oropharynx as per pre-operative assessment

## 2023-04-05 NOTE — Assessment & Plan Note (Addendum)
Hypercalcemia 12.3, in the setting of presumed metastatic kidney cancer.  Mental status intact.  Bone scan 03/31/2023, results pending. - 1L bolus, trend for now -Check albumin

## 2023-04-06 ENCOUNTER — Encounter (HOSPITAL_COMMUNITY): Payer: Self-pay | Admitting: Urology

## 2023-04-06 DIAGNOSIS — G4733 Obstructive sleep apnea (adult) (pediatric): Secondary | ICD-10-CM | POA: Diagnosis not present

## 2023-04-06 DIAGNOSIS — N2889 Other specified disorders of kidney and ureter: Secondary | ICD-10-CM | POA: Diagnosis not present

## 2023-04-06 DIAGNOSIS — J189 Pneumonia, unspecified organism: Secondary | ICD-10-CM | POA: Diagnosis not present

## 2023-04-06 LAB — CBC
HCT: 28.7 % — ABNORMAL LOW (ref 39.0–52.0)
Hemoglobin: 8.8 g/dL — ABNORMAL LOW (ref 13.0–17.0)
MCH: 27.3 pg (ref 26.0–34.0)
MCHC: 30.7 g/dL (ref 30.0–36.0)
MCV: 89.1 fL (ref 80.0–100.0)
Platelets: 313 10*3/uL (ref 150–400)
RBC: 3.22 MIL/uL — ABNORMAL LOW (ref 4.22–5.81)
RDW: 16 % — ABNORMAL HIGH (ref 11.5–15.5)
WBC: 93.3 10*3/uL (ref 4.0–10.5)
nRBC: 0 % (ref 0.0–0.2)

## 2023-04-06 LAB — BASIC METABOLIC PANEL
Anion gap: 6 (ref 5–15)
BUN: 24 mg/dL — ABNORMAL HIGH (ref 8–23)
CO2: 25 mmol/L (ref 22–32)
Calcium: 11.4 mg/dL — ABNORMAL HIGH (ref 8.9–10.3)
Chloride: 104 mmol/L (ref 98–111)
Creatinine, Ser: 1.63 mg/dL — ABNORMAL HIGH (ref 0.61–1.24)
GFR, Estimated: 44 mL/min — ABNORMAL LOW (ref >60)
Glucose, Bld: 124 mg/dL — ABNORMAL HIGH (ref 70–99)
Potassium: 4.5 mmol/L (ref 3.5–5.1)
Sodium: 135 mmol/L (ref 135–145)

## 2023-04-06 MED ORDER — BUDESONIDE 0.5 MG/2ML IN SUSP
0.5000 mg | Freq: Two times a day (BID) | RESPIRATORY_TRACT | Status: DC
  Administered 2023-04-06 – 2023-04-12 (×11): 0.5 mg via RESPIRATORY_TRACT
  Filled 2023-04-06 (×11): qty 2

## 2023-04-06 MED ORDER — METHYLPREDNISOLONE SODIUM SUCC 40 MG IJ SOLR
40.0000 mg | Freq: Two times a day (BID) | INTRAMUSCULAR | Status: DC
  Administered 2023-04-06 – 2023-04-09 (×6): 40 mg via INTRAVENOUS
  Filled 2023-04-06 (×6): qty 1

## 2023-04-06 NOTE — Anesthesia Postprocedure Evaluation (Signed)
Anesthesia Post Note  Patient: Joshua Gentry  Procedure(s) Performed: XI ROBOTIC ASSISTED LAPAROSCOPIC NEPHRECTOMY (Right: Renal)  Patient location during evaluation: Phase II Anesthesia Type: General Level of consciousness: awake Pain management: pain level controlled Vital Signs Assessment: post-procedure vital signs reviewed and stable Respiratory status: spontaneous breathing and respiratory function stable Cardiovascular status: blood pressure returned to baseline and stable Postop Assessment: no headache and no apparent nausea or vomiting Anesthetic complications: no Comments: Late entry   No notable events documented.   Last Vitals:  Vitals:   04/06/23 0500 04/06/23 0616  BP: (!) 113/37   Pulse: (!) 59 (!) 59  Resp: 17 (!) 21  Temp:    SpO2: 91% 93%    Last Pain:  Vitals:   04/06/23 0500  TempSrc:   PainSc: Asleep                 Windell Norfolk

## 2023-04-06 NOTE — Progress Notes (Signed)
Foley catheter removed per protocol.

## 2023-04-06 NOTE — Progress Notes (Signed)
Progress Note   Patient: Joshua Gentry:[id number] DOB: 1946-01-29 DOA: 04/05/2023     1 DOS: the patient was seen and examined on 04/06/2023   Brief hospital initial consultation note: APURVA BLAYNEY is a 77 y.o. male with past medical history of adrenal mass, coronary artery disease, OSA on CPAP.  Patient was diagnosed with renal mass, after he presented abnormal labs, gross hematuria and AKI and was found to have new metastatic disease- 03/16/23.  Followed up with nephrology on discharge, and underwent nephrectomy today 04/05/23.  Hospitalist was consulted today after surgery due to patient being hypoxic, requiring nonrebreather.  He required general anesthesia and was intubated for the procedure.    Assessment and Plan: * Right renal mass -Presumed metastatic right kidney cancer.   -Status post da Vinci robotic assisted laparoscopic right nephrectomy, by Dr. Ronne Binning.   -Continue outpatient follow-up with oncology service (Dr. Ellin Saba), soon to discuss final diagnosis and treatment plan.  CAP (community acquired pneumonia) -Presenting with dyspnea, hypoxia, chronic cough.   -Patient rules in for severe sepsis with tachypnea, respiratory rate 27-32, and significant worsening leukocytosis peake at 93K -Continue IV antibiotics -Continue mucolytic's, flutter valve, incentive spirometer, bronchodilator management. -Given presence of expiratory wheezing steroids has been initiated -Continue to wean off oxygen supplementation and follow clinical response.    Acute hypoxic respiratory failure (HCC) -Status post XI robotic assisted laparoscopic right nephrectomy.  Post-op patient, hypoxic-sats down to 89% on 4 L, at time of initial consultation requiring nonrebreather to keep sats above 92%.   -Per family he has had chronic dyspnea, over the past several years that has progressed, but over the past several days/weeks, patient has been increasingly short of breath with minimal activity.   Prior to admission, sats will drop to high 80s with activity.  Mental status intact. Quit smoking cigarettes about 27 years ago. -CT scan/chest x-ray suggesting left lower lobe pneumonia versus atelectasis with chronic emphysematous changes -No fever, WBCs up to 93,000 and patient experiencing tachypnea with activity. -Case discussed with Dr. Ellin Saba who recommended treatment with antibiotics and steroids; possible leukemoid reaction to acute infection and the stress from surgery. -Continue supportive care and follow clinical response. -Continue to wean of oxygen supplementation as tolerated. -Continue as needed bronchodilators. -Patient May need home O2 on discharge  Hypercalcemia -Hypercalcemia 12.3, in the setting of presumed metastatic kidney cancer.  -Mental status intact -Continue to follow calcium trend -Continue to maintain adequate hydration.   COPD (chronic obstructive pulmonary disease) (HCC) -Positive expiratory wheezing appreciated on examination -Treatment with the steroids will be initiated -Pulmicort and continue use of bronchodilator will be provided. -Mucolytic's and flutter valve has been encouraged -Follow clinical response.  OSA on CPAP -Continue CPAP nightly.  Essential hypertension -Stable blood pressure -Continue current antihypertensive agents.   Subjective:  Afebrile, requiring 3 L nasal cannula supplementation; no complaining of chest pain; short winded with exertion.  Intermittent productive coughing spells reported by family members.  Physical Exam: Vitals:   04/06/23 1412 04/06/23 1446 04/06/23 1530 04/06/23 1533  BP:  (!) 110/48 133/86   Pulse:  67 70   Resp:  (!) 28 (!) 23   Temp:    (!) 97.5 F (36.4 C)  TempSrc:    Oral  SpO2: 93% 91% 94%   Weight:      Height:       General exam: Alert, awake, oriented x 3; requiring 3 L nasal cannula supplementation keeping saturation at rest above 90%.  Still short  winded.  No fever Respiratory  system: Positive rhonchi bilaterally (left more than right); decreased breath sounds at the bases with bilateral expiratory wheezing on auscultation.  No using accessory muscles. Cardiovascular system:RRR.  No rubs, no gallops, no JVD on exam. Gastrointestinal system: Abdomen is nondistended, soft and with positive bowel sounds. Central nervous system: No focal neurological deficits. Extremities: No cyanosis, clubbing or edema. Skin: No petechiae. Psychiatry: Judgement and insight appear normal.  Flat affect.   Data Reviewed: CBC: White blood cells 90 3.3K, hemoglobin 8.8 and platelet count 313K. COVID PCR: Negative Lactic acid: 1.2  Family Communication: Wife at bedside.  Disposition: Status is: Inpatient Remains inpatient appropriate because: Continue treatment with IV antibiotics and steroids.  Follow further recommendations postoperatively by primary team.   Planned Discharge Destination: To be determined.  Time spent: 50 minutes  Author: Vassie Loll, MD 04/06/2023 5:21 PM  For on call review [email or web address].

## 2023-04-06 NOTE — Evaluation (Signed)
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 s/p CABG 1997.   Heart attack (HCC)    Hyperlipidemia    Hypertension    Obesity (BMI 30-39.9)    OSA on CPAP 10/16/2014   Compliant   RBBB with left anterior fascicular block    Renal insufficiency    Past Surgical History:  Past Surgical History:  Procedure Laterality Date   COLONOSCOPY N/A 08/01/2013   Procedure: COLONOSCOPY;  Surgeon: Dalia Heading, MD;  Location: [facility];  Service: Gastroenterology;  Laterality: N/A;   CORONARY ARTERY BYPASS GRAFT     x4   Lens Bilateral April 2016   Replacment    LOWER LEG SOFT TISSUE TUMOR EXCISION     HPI:  Joshua Gentry is a 77 yo male Presumed metastatic right kidney cancer.  Status post XI robotic assisted laparoscopic right nephrectomy, by Dr. Ronne Binning.  Follows with Dr. Ellin Saba, soon to discuss final diagnosis and treatment plan. Presenting with dyspnea, hypoxia, chronic cough.  Rules in for severe sepsis with tachypnea, respiratory rate 27-32, and significant worsening leukocytosis at 76.7, with evidence of endorgan dysfunction- acute respiratory failure.  Chest x-ray with increased confluent density of the left lung base concerning for pneumonia or atelectasis.  Also new small bilateral pleural effusions L > R. BSE requested.    Assessment / Plan / Recommendation  Clinical Impression  Clinical swallow evaluation completed in room with family present. Pt is post-op day 1 right nephrectomy. Pt somewhat slow to respond to questions. He reports recent globus sensation with solid foods, but has a difficult time elaborating.  His wife indicates that this is new news to her and just thought that he had a lack of appetite. Oral motor exam is WNL Pt assessed with ice chips, water via cup/straw, puree, and small bite of cracker. He does not exhibit any signs or symptoms of aspiration and no current reports of globus, however intake was limited. Recommend advance to regular textures and thin liquids, once cleared by surgeon. SLP will follow during acute stay to monitor swallowing needs/changes. SLP Visit Diagnosis: Dysphagia, unspecified (R13.10)    Aspiration Risk  No limitations    Diet Recommendation Regular;Thin liquid    Liquid Administration via: Cup;Straw Medication Administration: Whole meds with liquid Supervision: Patient able to self feed Postural Changes: Seated upright at 90 degrees;Remain upright for at least 30 minutes after po intake    Other  Recommendations Oral Care Recommendations: Oral care BID    Recommendations for follow up therapy are one component of a multi-disciplinary discharge planning process, led by the attending physician.  Recommendations may be updated based on patient status, additional functional criteria and insurance authorization.  Follow up Recommendations No SLP follow up      Assistance Recommended at Discharge    Functional Status Assessment Patient has had a recent decline in their functional status and demonstrates the ability to make significant improvements in function in a reasonable and predictable amount of time.  Frequency and Duration min 2x/week  1 week       Prognosis Prognosis for improved oropharyngeal function: Good  Swallow Study   General Date of Onset: 04/05/23 HPI: Tunney Lamprecht is a 77 yo male Presumed metastatic right kidney cancer.  Status post XI robotic assisted laparoscopic right nephrectomy, by Dr. Ronne Binning.  Follows with Dr. Ellin Saba, soon to discuss final diagnosis and treatment plan. Presenting with dyspnea, hypoxia, chronic cough.   Rules in for severe sepsis with tachypnea, respiratory rate 27-32, and significant worsening leukocytosis at 76.7, with evidence of endorgan dysfunction- acute respiratory failure.  Chest x-ray with increased confluent density of the left lung base concerning for pneumonia or atelectasis.  Also new small bilateral pleural effusions L > R. BSE requested. Type of Study: Bedside Swallow Evaluation Diet Prior to this Study: Clear liquid diet Temperature Spikes Noted: No Respiratory Status: Nasal cannula History of Recent Intubation: Yes Total duration of intubation (days): 1 days Date extubated: 04/05/23 Behavior/Cognition: Alert;Cooperative;Pleasant mood (slow to respond to questions) Oral Cavity Assessment: Within Functional Limits Oral Care Completed by SLP: Recent completion by staff Oral Cavity - Dentition: Adequate natural dentition Vision: Functional for self-feeding Self-Feeding Abilities: Able to feed self Patient Positioning: Upright in bed Baseline Vocal Quality: Normal Volitional Cough: Strong Volitional Swallow: Able to elicit    Oral/Motor/Sensory Function Overall Oral Motor/Sensory Function: Within functional limits   Ice Chips Ice chips: Within functional limits Presentation: Spoon   Thin Liquid Thin Liquid: Within functional limits Presentation: Cup;Self Fed;Straw    Nectar Thick Nectar Thick Liquid: Not tested   Honey Thick Honey Thick Liquid: Not tested   Puree Puree: Within functional limits Presentation: Spoon   Solid     Solid: Within functional limits Presentation: Self Fed Other Comments: Pt only consumed small amount of graham cracker due to being on advancing to full liquid diet     Thank you,  Joshua Gentry, CCC-SLP [phone]  Joshua Gentry 04/06/2023,1:25 PM

## 2023-04-06 NOTE — TOC CM/SW Note (Signed)
Transition of Care [facility]) - Inpatient Brief Assessment   Patient Details  Name: Joshua Gentry MRN: [id number] Date of Birth: 01-24-1946  Transition of Care [facility]) CM/SW Contact:    Villa Herb, LCSWA Phone Number: 04/06/2023, 10:07 AM   Clinical Narrative: TOC noted during chart review that pt is on O2 currently. TOC will continue following pt and assist with set up of home O2 if needed at D/C. TOC to follow.   Transition of Care Asessment: Insurance and Status: Insurance coverage has been reviewed Patient has primary care physician: Yes Home environment has been reviewed: from home Prior level of function:: independent Prior/Current Home Services: No current home services Social Determinants of Health Reivew: SDOH reviewed no interventions necessary Readmission risk has been reviewed: Yes Transition of care needs: no transition of care needs at this time

## 2023-04-06 NOTE — Progress Notes (Signed)
1 Day Post-Op Subjective: Patient reports mild incisional pain. Hemoglobin 8.9. Foley removed last night. WBC count 93.3.   Objective: Vital signs in last 24 hours: Temp:  [96.9 F (36.1 C)-98.4 F (36.9 C)] 98.1 F (36.7 C) (08/20 1118) Pulse Rate:  [52-85] 58 (08/20 1230) Resp:  [16-33] 22 (08/20 1230) BP: (100-145)/(37-79) 115/54 (08/20 1230) SpO2:  [89 %-98 %] 94 % (08/20 1230) Weight:  [107.6 kg] 107.6 kg (08/19 1758)  Intake/Output from previous day: 08/19 0701 - 08/20 0700 In: 3101.7 [I.V.:1900; IV Piggyback:1201.7] Out: 500 [Urine:400; Blood:100] Intake/Output this shift: Total I/O In: 0  Out: 175 [Urine:175]  Physical Exam:  General:alert, cooperative, and appears stated age GI: soft, non tender, normal bowel sounds, no palpable masses, no organomegaly, no inguinal hernia Male genitalia: not done Extremities: extremities normal, atraumatic, no cyanosis or edema  Lab Results: Recent Labs    04/05/23 1354 04/05/23 1627 04/06/23 0449  HGB 9.9* 10.5* 8.8*  HCT 31.9* 34.1* 28.7*   BMET Recent Labs    04/05/23 1627 04/06/23 0449  NA 133* 135  K 4.4 4.5  CL 103 104  CO2 22 25  GLUCOSE 176* 124*  BUN 23 24*  CREATININE 1.51* 1.63*  CALCIUM 12.3* 11.4*   No results for input(s): "LABPT", "INR" in the last 72 hours. No results for input(s): "LABURIN" in the last 72 hours. Results for orders placed or performed during the hospital encounter of 04/05/23  SARS Coronavirus 2 by RT PCR (hospital order, performed in [facility] hospital lab) *cepheid single result test* Anterior Nasal Swab     Status: None   Collection Time: 04/05/23  8:02 PM   Specimen: Anterior Nasal Swab  Result Value Ref Range Status   SARS Coronavirus 2 by RT PCR NEGATIVE NEGATIVE Final    Comment: (NOTE) SARS-CoV-2 target nucleic acids are NOT DETECTED.  The SARS-CoV-2 RNA is generally detectable in upper and lower respiratory specimens during the acute phase of infection. The  lowest concentration of SARS-CoV-2 viral copies this assay can detect is 250 copies / mL. A negative result does not preclude SARS-CoV-2 infection and should not be used as the sole basis for treatment or other patient management decisions.  A negative result may occur with improper specimen collection / handling, submission of specimen other than nasopharyngeal swab, presence of viral mutation(s) within the areas targeted by this assay, and inadequate number of viral copies (<250 copies / mL). A negative result must be combined with clinical observations, patient history, and epidemiological information.  Fact Sheet for Patients:   RoadLapTop.co.za  Fact Sheet for Healthcare Providers: http://kim-miller.com/  This test is not yet approved or  cleared by the [place] FDA and has been authorized for detection and/or diagnosis of SARS-CoV-2 by FDA under an Emergency Use Authorization (EUA).  This EUA will remain in effect (meaning this test can be used) for the duration of the COVID-19 declaration under Section 564(b)(1) of the Act, 21 U.S.C. section 360bbb-3(b)(1), unless the authorization is terminated or revoked sooner.  Performed at [facility], [address] [place], [place] [address]     Studies/Results: [place] CHEST PORT 1 VIEW  Result Date: 04/05/2023 CLINICAL DATA:  Hypoxia EXAM: PORTABLE CHEST 1 VIEW COMPARISON:  03/15/2023 FINDINGS: Numerous scattered pulmonary nodules more concentrated at the lung bases. Severe emphysema.  Prior CABG. Confluent density at the left lung base is increased from prior and raises the possibility of left lower lobe pneumonia or atelectasis. Heart size within normal limits. New  blunting of the bilateral costophrenic angles, left greater than right, suggesting small pleural effusions. Free intraperitoneal gas is present below the diaphragms. The patient had a robot assisted right laparoscopic radical  nephrectomy this after tan, which readily explains the pneumoperitoneum as an expected finding. IMPRESSION: 1. New small bilateral pleural effusions, left greater than right. 2. Confluent density at the left lung base is increased from prior and raises the possibility of left lower lobe pneumonia or atelectasis. 3. Numerous scattered pulmonary nodules more concentrated at the lung bases. 4. Severe emphysema. 5. Free intraperitoneal gas below the diaphragms, expected finding given this afternoon's laparoscopic surgery. Electronically Signed   By: Gaylyn Rong M.D.   On: 04/05/2023 18:53    Assessment/Plan: POD#1 right radical nephrectomy -continue current pain control regiment -continue clear liquid diet -continue respiratory protocol   LOS: 1 day   Wilkie Aye 04/06/2023, 12:37 PM

## 2023-04-06 NOTE — Progress Notes (Signed)
Patient was on around 2 to 3L on [place] with sat in mid 80s.  Raised flow to 6L and sat has came up to 91%.  Sat drops when patient is sleeping.  Encouraging CPAP usage for tonight.

## 2023-04-06 NOTE — Progress Notes (Signed)
Went in to put patient's home CPAP together.  Patient had brought everything from home but tubing.  Patient's mask did not fit our tubing so I had to use our disposable mask with tubing.  Patient is on home unit.  6L bled in as well.  Sat at 93%.

## 2023-04-07 DIAGNOSIS — G4733 Obstructive sleep apnea (adult) (pediatric): Secondary | ICD-10-CM | POA: Diagnosis not present

## 2023-04-07 DIAGNOSIS — J189 Pneumonia, unspecified organism: Secondary | ICD-10-CM | POA: Diagnosis not present

## 2023-04-07 DIAGNOSIS — N2889 Other specified disorders of kidney and ureter: Secondary | ICD-10-CM | POA: Diagnosis not present

## 2023-04-07 LAB — CBC
HCT: 31.2 % — ABNORMAL LOW (ref 39.0–52.0)
Hemoglobin: 9.3 g/dL — ABNORMAL LOW (ref 13.0–17.0)
MCH: 26.7 pg (ref 26.0–34.0)
MCHC: 29.8 g/dL — ABNORMAL LOW (ref 30.0–36.0)
MCV: 89.7 fL (ref 80.0–100.0)
Platelets: 334 10*3/uL (ref 150–400)
RBC: 3.48 MIL/uL — ABNORMAL LOW (ref 4.22–5.81)
RDW: 16 % — ABNORMAL HIGH (ref 11.5–15.5)
WBC: 101.7 10*3/uL (ref 4.0–10.5)
nRBC: 0 % (ref 0.0–0.2)

## 2023-04-07 LAB — BASIC METABOLIC PANEL
Anion gap: 7 (ref 5–15)
BUN: 26 mg/dL — ABNORMAL HIGH (ref 8–23)
CO2: 25 mmol/L (ref 22–32)
Calcium: 11.5 mg/dL — ABNORMAL HIGH (ref 8.9–10.3)
Chloride: 104 mmol/L (ref 98–111)
Creatinine, Ser: 1.53 mg/dL — ABNORMAL HIGH (ref 0.61–1.24)
GFR, Estimated: 47 mL/min — ABNORMAL LOW (ref >60)
Glucose, Bld: 113 mg/dL — ABNORMAL HIGH (ref 70–99)
Potassium: 4.5 mmol/L (ref 3.5–5.1)
Sodium: 136 mmol/L (ref 135–145)

## 2023-04-07 MED ORDER — HALOPERIDOL LACTATE 5 MG/ML IJ SOLN
2.0000 mg | Freq: Once | INTRAMUSCULAR | Status: AC
  Administered 2023-04-07: 2 mg via INTRAVENOUS
  Filled 2023-04-07: qty 1

## 2023-04-07 MED ORDER — ALPRAZOLAM 0.5 MG PO TABS
0.5000 mg | ORAL_TABLET | Freq: Two times a day (BID) | ORAL | Status: DC | PRN
  Administered 2023-04-07 – 2023-04-10 (×5): 0.5 mg via ORAL
  Filled 2023-04-07 (×5): qty 1

## 2023-04-07 MED ORDER — SODIUM CHLORIDE 0.9 % IV BOLUS
1000.0000 mL | Freq: Once | INTRAVENOUS | Status: AC
  Administered 2023-04-07: 1000 mL via INTRAVENOUS

## 2023-04-07 NOTE — Evaluation (Signed)
Physical Therapy Evaluation Patient Details Name: GARREL STEEGE MRN: [id number] DOB: July 19, 1946 Today's Date: 04/07/2023  History of Present Illness  DOMONIC BUCHINGER  has presented today for surgery, with the diagnosis of kidney mass.  The various methods of treatment have been discussed with the patient and family. After consideration of risks, benefits and other options for treatment, the patient has consented to  Procedure(s):  XI ROBOTIC ASSISTED LAPAROSCOPIC NEPHRECTOMY (Right) as a surgical intervention.  The patient's history has been reviewed, patient examined, no change in status, stable for surgery.  I have reviewed the patient's chart and labs.  Questions were answered to the patient's satisfaction.    Clinical Impression  Patient sitting up on the edge of the bed on therapist arrival; his wife is present at bedside.  Patient has been up in the chair earlier; assisted by nursing; and was not comfortable there.  He declines going back to the chair now.  Patient performs sit to stand to RW with min A from therapist due leg weakness.  He is able to stand at bedside with RW and CGA to min A due to impaired balance and leg weakness and cues for improved posturing.  Patient returns to sitting on the edge of the bed; good sitting posture if both hands and feet are supported.  patient left in bed with call button in reach; wife present and nursing notified of mobility status.  Patient will benefit from continued skilled therapy services during the remainder of his hospital stay and at the next recommended venue of care to address deficits and promote return to optimal function.           If plan is discharge home, recommend the following: A little help with walking and/or transfers;A little help with bathing/dressing/bathroom;Help with stairs or ramp for entrance   Can travel by private vehicle        Equipment Recommendations None recommended by PT  Recommendations for Other Services        Functional Status Assessment Patient has had a recent decline in their functional status and demonstrates the ability to make significant improvements in function in a reasonable and predictable amount of time.     Precautions / Restrictions Precautions Precautions: Fall Restrictions Weight Bearing Restrictions: No      Mobility  Bed Mobility                 Patient Response: Cooperative  Transfers Overall transfer level: Needs assistance Equipment used: Rolling walker (2 wheels) Transfers: Sit to/from Stand Sit to Stand: Min assist           General transfer comment: uses one hand only on walker    Ambulation/Gait     Assistive device: Rolling walker (2 wheels)         General Gait Details: patient did not want to walk over to the chair; had just been up in chair and was not comfortable  Stairs            Wheelchair Mobility     Tilt Bed Tilt Bed Patient Response: Cooperative  Modified Rankin (Stroke Patients Only)       Balance Overall balance assessment: Needs assistance Sitting-balance support: Bilateral upper extremity supported, Feet supported Sitting balance-Leahy Scale: Good Sitting balance - Comments: fair to good sitting balance on the edge of the hospital bed with right arm supported on bed railing and left hand on mattress; feet on the floor     Standing balance-Leahy Scale: Fair  Standing balance comment: fair standing balance with RW and PT min A to CGA for safety                             Pertinent Vitals/Pain Pain Assessment Pain Assessment: No/denies pain    Home Living Family/patient expects to be discharged to:: Private residence Living Arrangements: Spouse/significant other Available Help at Discharge: Available 24 hours/day;Family Type of Home: House Home Access: Stairs to enter Entrance Stairs-Rails: Left;Right;Can reach both Secretary/administrator of Steps: 5-6   Home Layout: One  level Home Equipment: Agricultural consultant (2 wheels);Grab bars - toilet      Prior Function Prior Level of Function : Independent/Modified Independent                     Extremity/Trunk Assessment   Upper Extremity Assessment Upper Extremity Assessment: Generalized weakness    Lower Extremity Assessment Lower Extremity Assessment: Generalized weakness    Cervical / Trunk Assessment Cervical / Trunk Assessment: Normal  Communication   Communication Communication: No apparent difficulties  Cognition Arousal: Alert Behavior During Therapy: WFL for tasks assessed/performed Overall Cognitive Status: Within Functional Limits for tasks assessed                                 General Comments: appears to have some mild confusion per wife but able to follow commands and answers therapist questions appropriately        General Comments      Exercises     Assessment/Plan    PT Assessment Patient needs continued PT services  PT Problem List Decreased strength;Decreased activity tolerance;Decreased balance;Decreased mobility       PT Treatment Interventions DME instruction;Gait training;Stair training;Functional mobility training;Therapeutic activities;Therapeutic exercise;Balance training    PT Goals (Current goals can be found in the Care Plan section)  Acute Rehab PT Goals Patient Stated Goal: return home PT Goal Formulation: With patient/family Time For Goal Achievement: 04/21/23 Potential to Achieve Goals: Good    Frequency Min 2X/week     Co-evaluation               AM-PAC PT "6 Clicks" Mobility  Outcome Measure Help needed turning from your back to your side while in a flat bed without using bedrails?: A Little Help needed moving from lying on your back to sitting on the side of a flat bed without using bedrails?: A Little Help needed moving to and from a bed to a chair (including a wheelchair)?: A Little Help needed standing up from  a chair using your arms (e.g., wheelchair or bedside chair)?: A Little Help needed to walk in hospital room?: A Lot Help needed climbing 3-5 steps with a railing? : A Lot 6 Click Score: 16    End of Session Equipment Utilized During Treatment: Oxygen Activity Tolerance: Patient limited by fatigue Patient left: in bed;with family/visitor present;with call bell/phone within reach Nurse Communication: Mobility status PT Visit Diagnosis: Other abnormalities of gait and mobility (R26.89);Unsteadiness on feet (R26.81);Muscle weakness (generalized) (M62.81)    Time: 1120-1140 PT Time Calculation (min) (ACUTE ONLY): 20 min   Charges:   PT Evaluation $PT Eval Low Complexity: 1 Low   PT General Charges $$ ACUTE PT VISIT: 1 Visit         11:56 AM, 04/07/23 Jowan Skillin Small Susano Cleckler MPT [place] [facility] [place] [phone] [phone]

## 2023-04-07 NOTE — Progress Notes (Signed)
Pt arrived to room 319 via WC from ICU. Oriented to room and safety procedures. Bed alarm on for safety, call bell within reach. Wife at bedside.

## 2023-04-07 NOTE — Progress Notes (Signed)
Progress Note   Patient: Joshua Gentry VHQ:[id number] DOB: April 19, 1946 DOA: 04/05/2023     2 DOS: the patient was seen and examined on 04/07/2023   Brief hospital initial consultation note: Joshua Gentry is a 77 y.o. male with past medical history of adrenal mass, coronary artery disease, OSA on CPAP.  Patient was diagnosed with renal mass, after he presented abnormal labs, gross hematuria and AKI and was found to have new metastatic disease- 03/16/23.  Followed up with nephrology on discharge, and underwent nephrectomy today 04/05/23.  Hospitalist was consulted today after surgery due to patient being hypoxic, requiring nonrebreather.  He required general anesthesia and was intubated for the procedure.    Assessment and Plan: * Right renal mass -Presumed metastatic right kidney cancer.   -Status post da Vinci robotic assisted laparoscopic right nephrectomy, by Dr. Ronne Gentry.   -Continue outpatient follow-up with oncology service (Dr. Ellin Gentry), soon to discuss final diagnosis and treatment plan.  CAP (community acquired pneumonia) -Presenting with dyspnea, hypoxia, chronic cough.   -Patient rules in for severe sepsis with tachypnea, respiratory rate 27-32, and significant worsening leukocytosis peaked at 101.7K -Continue IV antibiotics, steroids, adequate hydration and follow clinical response. -Will also continue mucolytic's, flutter valve, incentive spirometer and bronchodilator management. -Given presence of expiratory wheezing steroids has been initiated -Continue to wean off oxygen supplementation and follow clinical response.    Acute hypoxic respiratory failure (HCC) -Status post XI robotic assisted laparoscopic right nephrectomy.  Post-op patient, hypoxic-sats down to 89% on 4 L, at time of initial consultation requiring nonrebreather to keep sats above 92%.   -Per family he has had chronic dyspnea, over the past several years that has progressed, but over the past several  days/weeks, patient has been increasingly short of breath with minimal activity.  Prior to admission, sats will drop to high 80s with activity.  Mental status intact. Quit smoking cigarettes about 27 years ago. -CT scan/chest x-ray suggesting left lower lobe pneumonia versus atelectasis with chronic emphysematous changes -No fever, WBCs up to 93,000 and patient experiencing tachypnea with activity. -Case discussed with Dr. Ellin Gentry. -Continue therapy with antibiotics, steroids and nebulizer management -Continue supportive care and follow clinical response. -Continue to wean of oxygen supplementation as tolerated. -Patient May need home O2 on discharge  Hypercalcemia -Hypercalcemia 12.3, in the setting of presumed metastatic kidney cancer.  -Mental status intact -Continue to follow calcium trend -Continue to maintain adequate hydration.   COPD (chronic obstructive pulmonary disease) (HCC) -Positive expiratory wheezing appreciated on examination -Treatment with the steroids will be initiated -Pulmicort and continue use of bronchodilator will be provided. -Mucolytic's and flutter valve has been encouraged -Follow clinical response.  OSA on CPAP -Continue CPAP nightly.  Essential hypertension -Stable blood pressure -Continue current antihypertensive agents.   Subjective:  No fever, no chest pain, no nausea vomiting.  Still requiring 2-3 L to maintain saturation above 90%.  Short winded with activity.  Overnight feeling restless, agitated and refused to use CPAP.  Physical Exam: Vitals:   04/07/23 1040 04/07/23 1144 04/07/23 1423 04/07/23 1630  BP: 137/60  (!) 128/59   Pulse: 67  82 84  Resp: 20  18 18   Temp:  (!) 97.3 F (36.3 C) 98.6 F (37 C)   TempSrc:  Oral Oral   SpO2: 91%  91% (!) 89%  Weight:      Height:       General exam: Alert, awake, oriented to person and place; reports no nausea, no vomiting, no chest  pain.  Still short winded and demonstrating the need of  oxygen supplementation to keep saturation above 90%.  (Now using 2-3 L). Respiratory system: No using accessory muscles.  Intermittent tachypnea with exertion demonstrated.  Mild expiratory wheezing on exam.  No crackles. Cardiovascular system:RRR. No rubs or gallops. Gastrointestinal system: Abdomen is nondistended, soft and with positive bowel sounds. Central nervous system: No focal neurological deficits. Extremities: No cyanosis or clubbing. Skin: No petechiae. Psychiatry: Flat affect appreciated.  Data Reviewed: CBC: White blood cells 101.7K, hemoglobin 9.3 and platelet count 3 34K. COVID PCR: Negative Lactic acid: 1.2 Basic metabolic panel: Sodium 136, potassium 4.5, chloride 104, bicarb 25, glucose 113, BUN 26, creatinine 1.53 and calcium 11.5. GFR 47  Family Communication: Wife at bedside.  Disposition: Status is: Inpatient Remains inpatient appropriate because: Continue treatment with IV antibiotics and steroids.  Follow further recommendations postoperatively by primary team.   Planned Discharge Destination: To be determined.  Time spent: 50 minutes  Author: Vassie Loll, MD 04/07/2023 6:35 PM  For on call review [email or web address].

## 2023-04-07 NOTE — Progress Notes (Signed)
Pt remains confused, oriented to self only. Restless, sitting up up on side of bed, fidgeting with cords and IV lines. IV lines disguised under linens. Family members remain at bedside and bed alarm off. IVF continue to infuse without s/s infiltration. O2 remains off, as pt will not leave [place] on and SaO2 holding at 90-91% on room air. Pt denies any c/o pain. Bed in low position, call bell within reach. Family advised to call for needs.

## 2023-04-07 NOTE — Progress Notes (Signed)
Patient started taking his Oxygen via HFNC off, which earlier he took his CPAP mask off too, patient started desatting to 78,79s, explained the use and importance of Oxygen and possible  consequences including the Code situation, patient is very adamant about not putting the oxygen on, says, "This is a game you are trying to trap me in" and some other paranoid stuffs, charge Rns including everyone tried, called the wife, wife's on the way to hospital, MD made aware, restrained with soft wrist restraints for a time being and put the oxygen on, Halodol given per order, spo2 peaked up and is in 90s now,  wife at the bedside, explained the wife about the happenings, she thinks the change in personality is because of the anaesthesia, patient was apologetic and agrees to contiunue with the treatment, will continue to monitor and woill endorse to the oncoming RNs

## 2023-04-07 NOTE — Progress Notes (Signed)
Speech Language Pathology Treatment: Dysphagia  Patient Details Name: Joshua Gentry MRN: [id number] DOB: 08/24/1945 Today's Date: 04/07/2023 Time: 4782-9562 SLP Time Calculation (min) (ACUTE ONLY): 18 min  Assessment / Plan / Recommendation Clinical Impression  Ongoing diagnostic dysphagia therapy provided today; Pt consumed limited trials of thin liquids without overt s/sx of oropharyngeal dysphagia. Pt and wife report no difficulty with swallowing but very little appetite. SLP reviewed universal aspiration precautions and provided support and encouragement. Recommend advance to regular textures and thin once cleared by surgeon. There are no further ST needs noted at this time, our service will sign off. Please re-consult if indicated. Thank you for this referral.    HPI HPI: Joshua Gentry is a 77 yo male Presumed metastatic right kidney cancer.  Status post XI robotic assisted laparoscopic right nephrectomy, by Dr. Ronne Binning.  Follows with Dr. Ellin Saba, soon to discuss final diagnosis and treatment plan. Presenting with dyspnea, hypoxia, chronic cough.  Rules in for severe sepsis with tachypnea, respiratory rate 27-32, and significant worsening leukocytosis at 76.7, with evidence of endorgan dysfunction- acute respiratory failure.  Chest x-ray with increased confluent density of the left lung base concerning for pneumonia or atelectasis.  Also new small bilateral pleural effusions L > R. BSE requested.      SLP Plan  Continue with current plan of care      Recommendations for follow up therapy are one component of a multi-disciplinary discharge planning process, led by the attending physician.  Recommendations may be updated based on patient status, additional functional criteria and insurance authorization.    Recommendations  Diet recommendations: Regular;Thin liquid Liquids provided via: Cup;Straw Medication Administration: Whole meds with liquid Supervision: Patient able to self  feed Compensations: Minimize environmental distractions;Slow rate;Small sips/bites                  Oral care BID     Dysphagia, unspecified (R13.10)     Continue with current plan of care     Kalea Perine H. Romie Levee, CCC-SLP Speech Language Pathologist   Georgetta Haber  04/07/2023, 3:48 PM

## 2023-04-07 NOTE — Progress Notes (Signed)
Patient states he is not ready to go on his CPAP at this time.  Son is at bedside and I told him I would check back again and instructed him to call the nurse if patient decides he is ready before I return.

## 2023-04-07 NOTE — Plan of Care (Signed)
  Problem: Acute Rehab PT Goals(only PT should resolve) Goal: Pt Will Go Supine/Side To Sit Outcome: Progressing Flowsheets (Taken 04/07/2023 1156) Pt will go Supine/Side to Sit: with minimal assist Goal: Patient Will Transfer Sit To/From Stand Outcome: Progressing Flowsheets (Taken 04/07/2023 1156) Patient will transfer sit to/from stand: with contact guard assist Goal: Pt Will Transfer Bed To Chair/Chair To Bed Outcome: Progressing Flowsheets (Taken 04/07/2023 1156) Pt will Transfer Bed to Chair/Chair to Bed: with contact guard assist Goal: Pt Will Ambulate Outcome: Progressing Flowsheets (Taken 04/07/2023 1156) Pt will Ambulate:  50 feet  with least restrictive assistive device  with contact guard assist

## 2023-04-07 NOTE — Progress Notes (Signed)
CRITICAL VALUE STICKER  CRITICAL VALUE: WBC 101.7  RECEIVER (on-site recipient of call): Lockie Mola, RN MD NOTIFIED: Mitchell Heir,   TIME OF NOTIFICATION: 1610  RESPONSE:  awaiting

## 2023-04-07 NOTE — Progress Notes (Signed)
Patient has taken off CPAP and will not let me nor the nurses put it back on.  I asked him to at least let me put the [place] back on and patient refused stating that "this is all a game":  He stated that he  has been in the room with nothing being done.  I explained to patient that the doctors have been monitoring him and giving him medication to get him better, but he flatly refused to let me put his O2 back on.  I explained that going without his oxygen could kill him and he stated he understood.  RN made aware of my conversation.

## 2023-04-07 NOTE — Progress Notes (Signed)
Patient had to be put in restraints to keep from pulling off oxygen tubing.  Patient back on 6L HFNC with sat now at 94%.  RN at bedside.

## 2023-04-07 NOTE — Progress Notes (Signed)
2 Days Post-Op Subjective: Patient reports mild incisional pain. Hemoglobin 9.3. patient with confusion overnight requiring haldol. WBC count 101.7.   Objective: Vital signs in last 24 hours: Temp:  [97.3 F (36.3 C)-98.6 F (37 C)] 97.8 F (36.6 C) (08/21 2051) Pulse Rate:  [56-92] 74 (08/21 2051) Resp:  [13-23] 18 (08/21 2051) BP: (91-152)/(37-73) 123/63 (08/21 2051) SpO2:  [80 %-98 %] 89 % (08/21 2051) FiO2 (%):  [88 %-89 %] 89 % (08/21 1146)  Intake/Output from previous day: 08/20 0701 - 08/21 0700 In: 1101.3 [P.O.:480; I.V.:271.3; IV Piggyback:350] Out: 875 [Urine:875] Intake/Output this shift: No intake/output data recorded.  Physical Exam:  General:alert, cooperative, and appears stated age GI: soft, non tender, normal bowel sounds, no palpable masses, no organomegaly, no inguinal hernia Male genitalia: not done Extremities: extremities normal, atraumatic, no cyanosis or edema  Lab Results: Recent Labs    04/05/23 1627 04/06/23 0449 04/07/23 0448  HGB 10.5* 8.8* 9.3*  HCT 34.1* 28.7* 31.2*   BMET Recent Labs    04/06/23 0449 04/07/23 0448  NA 135 136  K 4.5 4.5  CL 104 104  CO2 25 25  GLUCOSE 124* 113*  BUN 24* 26*  CREATININE 1.63* 1.53*  CALCIUM 11.4* 11.5*   No results for input(s): "LABPT", "INR" in the last 72 hours. No results for input(s): "LABURIN" in the last 72 hours. Results for orders placed or performed during the hospital encounter of 04/05/23  SARS Coronavirus 2 by RT PCR (hospital order, performed in [facility] hospital lab) *cepheid single result test* Anterior Nasal Swab     Status: None   Collection Time: 04/05/23  8:02 PM   Specimen: Anterior Nasal Swab  Result Value Ref Range Status   SARS Coronavirus 2 by RT PCR NEGATIVE NEGATIVE Final    Comment: (NOTE) SARS-CoV-2 target nucleic acids are NOT DETECTED.  The SARS-CoV-2 RNA is generally detectable in upper and lower respiratory specimens during the acute phase of infection.  The lowest concentration of SARS-CoV-2 viral copies this assay can detect is 250 copies / mL. A negative result does not preclude SARS-CoV-2 infection and should not be used as the sole basis for treatment or other patient management decisions.  A negative result may occur with improper specimen collection / handling, submission of specimen other than nasopharyngeal swab, presence of viral mutation(s) within the areas targeted by this assay, and inadequate number of viral copies (<250 copies / mL). A negative result must be combined with clinical observations, patient history, and epidemiological information.  Fact Sheet for Patients:   RoadLapTop.co.za  Fact Sheet for Healthcare Providers: http://kim-miller.com/  This test is not yet approved or  cleared by the [place] FDA and has been authorized for detection and/or diagnosis of SARS-CoV-2 by FDA under an Emergency Use Authorization (EUA).  This EUA will remain in effect (meaning this test can be used) for the duration of the COVID-19 declaration under Section 564(b)(1) of the Act, 21 U.S.C. section 360bbb-3(b)(1), unless the authorization is terminated or revoked sooner.  Performed at [facility], [address], [place], [place] [address]     Studies/Results: No results found.  Assessment/Plan: POD#2 right radical nephrectomy -continue current pain control regiment -continue clear liquid diet -continue respiratory protocol Transfer to floor   LOS: 2 days   Wilkie Aye 04/07/2023, 9:27 PM

## 2023-04-07 NOTE — Progress Notes (Signed)
Bed alarm sounding, arrived to room to find pt sitting on side of bed. Pt's wife stated that he walked to bathroom with her assistance to have BM. Unable to view BM as pt had already flushed toilet. Pt tolerated ambulation well, O2 off at this time by pt. SaO2 89% on RA but no SOB noted. O2 @ 2lpm [place] reapplied to nares. Pt denies any c/o at this time. Remains restless and oriented to person only. Wife at bedside. Bed alarm turned off per wife's request as she states she will remain at bedside with pt. Call bell within reach.

## 2023-04-08 DIAGNOSIS — N2889 Other specified disorders of kidney and ureter: Secondary | ICD-10-CM | POA: Diagnosis not present

## 2023-04-08 DIAGNOSIS — J189 Pneumonia, unspecified organism: Secondary | ICD-10-CM | POA: Diagnosis not present

## 2023-04-08 DIAGNOSIS — G4733 Obstructive sleep apnea (adult) (pediatric): Secondary | ICD-10-CM | POA: Diagnosis not present

## 2023-04-08 LAB — CBC
HCT: 30.8 % — ABNORMAL LOW (ref 39.0–52.0)
Hemoglobin: 9.2 g/dL — ABNORMAL LOW (ref 13.0–17.0)
MCH: 27 pg (ref 26.0–34.0)
MCHC: 29.9 g/dL — ABNORMAL LOW (ref 30.0–36.0)
MCV: 90.3 fL (ref 80.0–100.0)
Platelets: 276 10*3/uL (ref 150–400)
RBC: 3.41 MIL/uL — ABNORMAL LOW (ref 4.22–5.81)
RDW: 15.9 % — ABNORMAL HIGH (ref 11.5–15.5)
WBC: 122.8 10*3/uL (ref 4.0–10.5)
nRBC: 0 % (ref 0.0–0.2)

## 2023-04-08 LAB — BASIC METABOLIC PANEL
Anion gap: 8 (ref 5–15)
BUN: 30 mg/dL — ABNORMAL HIGH (ref 8–23)
CO2: 22 mmol/L (ref 22–32)
Calcium: 11.2 mg/dL — ABNORMAL HIGH (ref 8.9–10.3)
Chloride: 105 mmol/L (ref 98–111)
Creatinine, Ser: 1.61 mg/dL — ABNORMAL HIGH (ref 0.61–1.24)
GFR, Estimated: 44 mL/min — ABNORMAL LOW (ref >60)
Glucose, Bld: 101 mg/dL — ABNORMAL HIGH (ref 70–99)
Potassium: 4.4 mmol/L (ref 3.5–5.1)
Sodium: 135 mmol/L (ref 135–145)

## 2023-04-08 NOTE — TOC Progression Note (Signed)
Transition of Care [facility]) - Progression Note    Patient Details  Name: Joshua Gentry MRN: [id number] Date of Birth: November 15, 1945  [facility] CM/SW Contact  Annice Needy, LCSW Phone Number: 04/08/2023, 12:20 PM  Clinical Narrative:    Spoke with patient's wife who was at bedside regarding HHPT. They are agreeable to HHPT. They chose [facility] as they are familiar with the company's PT. Referral made and accepted.    Expected Discharge Plan: Home w Home Health Services Barriers to Discharge: Continued Medical Work up  Expected Discharge Plan and Services In-house Referral: Clinical Social Work   Post Acute Care Choice: Home Health Living arrangements for the past 2 months: Single Family Home                           HH Arranged: PT HH Agency: [facility] Date [facility] Agency Contacted: 04/08/23 Time HH Agency Contacted: 1215 Representative spoke with at [facility] Agency: Kandee Keen   Social Determinants of Health (SDOH) Interventions SDOH Screenings   Food Insecurity: No Food Insecurity (03/22/2023)  Housing: Low Risk  (03/22/2023)  Transportation Needs: No Transportation Needs (03/22/2023)  Utilities: Not At Risk (03/22/2023)  Depression (PHQ2-9): Low Risk  (03/22/2023)  Financial Resource Strain: Low Risk  (03/22/2023)  Tobacco Use: Medium Risk (04/05/2023)    Readmission Risk Interventions     No data to display

## 2023-04-08 NOTE — Care Management Important Message (Signed)
Important Message  Patient Details  Name: OMARE CORDLE MRN: [id number] Date of Birth: 11-03-1945   Medicare Important Message Given:  N/A - LOS <3 / Initial given by admissions     Corey Harold 04/08/2023, 3:08 PM

## 2023-04-08 NOTE — Progress Notes (Signed)
Patient placed on home CPAP unit with our mask.  4L oxygen bled in.  Patient tolerating well at this time.  Family at bedside and will call if needed.

## 2023-04-08 NOTE — Progress Notes (Signed)
Per family patient not placed on CPAP at this time.  He has been confused and getting out of bed repeatedly.  He is finally settled and resting and they do not want to wake him.

## 2023-04-08 NOTE — TOC Initial Note (Signed)
Transition of Care [facility] [place] [facility]) - Initial/Assessment Note    Patient Details  Name: Joshua Gentry MRN: [id number] Date of Birth: Oct 02, 1945  Transition of Care [facility]) CM/SW Contact:    Elliot Gault, LCSW Phone Number: 04/08/2023, 10:32 AM  Clinical Narrative:                  Pt from home. PT recommending HHPT at dc.   Spoke with pt's wife today to review dc planning. Pt has weaned down to 2L and wife hopeful for pt to return home with no need for O2.  Discussed HHPT and pt's wife is agreeable. CMS provider options reviewed. Pt's wife to discuss with family and will update TOC on provider choice once made.  TOC will follow and assist with dc planning.  Expected Discharge Plan: Home w Home Health Services Barriers to Discharge: Continued Medical Work up   Patient Goals and CMS Choice Patient states their goals for this hospitalization and ongoing recovery are:: go home CMS Medicare.gov Compare Post Acute Care list provided to:: Patient Represenative (must comment) Choice offered to / list presented to : Spouse      Expected Discharge Plan and Services In-house Referral: Clinical Social Work   Post Acute Care Choice: Home Health Living arrangements for the past 2 months: Single Family Home                                      Prior Living Arrangements/Services Living arrangements for the past 2 months: Single Family Home Lives with:: Spouse Patient language and need for interpreter reviewed:: Yes Do you feel safe going back to the place where you live?: Yes      Need for Family Participation in Patient Care: Yes (Comment) Care giver support system in place?: Yes (comment)   Criminal Activity/Legal Involvement Pertinent to Current Situation/Hospitalization: No - Comment as needed  Activities of Daily Living      Permission Sought/Granted Permission sought to share information with : Facility Industrial/product designer granted to share information with  : Yes, Verbal Permission Granted     Permission granted to share info w AGENCY: [facility]        Emotional Assessment       Orientation: : Oriented to Self Alcohol / Substance Use: Not Applicable Psych Involvement: No (comment)  Admission diagnosis:  Right renal mass [N28.89] Patient Active Problem List   Diagnosis Date Noted   Right renal mass 04/05/2023   Acute hypoxic respiratory failure (HCC) 04/05/2023   CAP (community acquired pneumonia) 04/05/2023   COPD (chronic obstructive pulmonary disease) (HCC) 04/05/2023   Hypercalcemia 04/05/2023   Gross hematuria 03/18/2023   Leukocytosis/SIRS 03/16/2023   Normocytic anemia 03/16/2023   Acute renal failure (ARF) (HCC) 03/16/2023   Renal mass 03/16/2023   Multiple lung nodules concerning for metastatic disesae 03/16/2023   OSA on CPAP 04/09/2015   Liposarcoma (HCC) 07/06/2013   RBBB plus LA hemiblock 03/21/2013   Obesity (BMI 30-39.9) 07/22/2009   Dyslipidemia, goal LDL below 70 01/09/2009   Essential hypertension 01/09/2009   Hx of CABG 01/09/2009   PCP:  Assunta Found, MD Pharmacy:   [facility] [phone] - [place], [place] - [address] AT [facility] [address] [place] [address] Phone: [phone] Fax: [phone]     Social Determinants of Health (SDOH) Social History: SDOH Screenings   Food Insecurity: No Food  Insecurity (03/22/2023)  Housing: Low Risk  (03/22/2023)  Transportation Needs: No Transportation Needs (03/22/2023)  Utilities: Not At Risk (03/22/2023)  Depression (PHQ2-9): Low Risk  (03/22/2023)  Financial Resource Strain: Low Risk  (03/22/2023)  Tobacco Use: Medium Risk (04/05/2023)   SDOH Interventions:     Readmission Risk Interventions     No data to display

## 2023-04-08 NOTE — Progress Notes (Signed)
Physical Therapy Treatment Patient Details Name: Joshua Gentry MRN: [id number] DOB: [birth year] Today's Date: 04/08/2023   History of Present Illness [name]  had surgery on  surgery, Rt nephrectomy on 04/06/23/.    PT Comments  Pt was able to stand yesterday but today unable to follow commands on a consistent basis today. PT actually resists rolling both to the Rt and LT as well as resists attempting to sit up, thus was not even able to sit on the edge of the bed.  Prior to admission pt was able to ambulate without an assistive device.  If pt does not improve significantly in his mobility this therapist would recommend SNF.  Pt does better with visual vs. verbal cuing for activity.     If plan is discharge home, recommend the following: A lot of help with walking and/or transfers;A lot of help with bathing/dressing/bathroom;Assist for transportation;Assistance with cooking/housework   Can travel by private vehicle        Equipment Recommendations  None recommended by PT    Recommendations for Other Services       Precautions / Restrictions Precautions Precautions: Fall Restrictions Weight Bearing Restrictions: No     Mobility  Bed Mobility Overal bed mobility: Needs Assistance Bed Mobility: Rolling, Sidelying to Sit Rolling: Total assist Sidelying to sit: Total assist                 Cognition Arousal: Lethargic Behavior During Therapy: WFL for tasks assessed/performed Overall Cognitive Status: Impaired/Different from baseline Area of Impairment: Following commands, Awareness                       Following Commands: Follows one step commands inconsistently                Exercises General Exercises - Lower Extremity Ankle Circles/Pumps: Both Heel Slides: Both, 5 reps Hip ABduction/ADduction: Both, 10 reps    General Comments        Pertinent Vitals/Pain Pain Assessment Pain Assessment: No/denies pain           PT Goals  (current goals can now be found in the care plan section) Acute Rehab PT Goals Potential to Achieve Goals: Poor Progress towards PT goals: Not progressing toward goals - comment (Pt was able to stand yesterday but today unable to follow commands on a consistent basis.  PT actually resists rolling both to the Rt and LT as well as resists attempting to sit up.  Pt does better with visual vs. verbal cuing for activity.)    Frequency    Min 2X/week      PT Plan  Continue to progress I in mobility        AM-PAC PT "6 Clicks" Mobility   Outcome Measure  Help needed turning from your back to your side while in a flat bed without using bedrails?: Total Help needed moving from lying on your back to sitting on the side of a flat bed without using bedrails?: Total Help needed moving to and from a bed to a chair (including a wheelchair)?: Total Help needed standing up from a chair using your arms (e.g., wheelchair or bedside chair)?: Total Help needed to walk in hospital room?: Total Help needed climbing 3-5 steps with a railing? : Total 6 Click Score: 6    End of Session Equipment Utilized During Treatment: Oxygen Activity Tolerance: Patient limited by lethargy Patient left: in bed;with family/visitor present;with call bell/phone within reach Nurse Communication:  Mobility status PT Visit Diagnosis: Other abnormalities of gait and mobility (R26.89);Unsteadiness on feet (R26.81);Muscle weakness (generalized) (M62.81)     Time: 0981-1914 PT Time Calculation (min) (ACUTE ONLY): 16 min  Charges:    $Therapeutic Exercise: 8-22 mins PT General Charges $$ ACUTE PT VISIT: 1 Visit                      Virgina Organ, PT CLT [phone]  04/08/2023, 3:34 PM

## 2023-04-08 NOTE — Progress Notes (Signed)
3 Days Post-Op Subjective: Patient reports mild incisional pain. Hemoglobin 9.2. patient with confusion overnight. WBC count 122.8  Objective: Vital signs in last 24 hours: Temp:  [97.8 F (36.6 C)-98.6 F (37 C)] 97.9 F (36.6 C) (08/22 0443) Pulse Rate:  [73-92] 73 (08/22 0443) Resp:  [18-20] 20 (08/22 0443) BP: (123-147)/(59-75) 140/75 (08/22 0443) SpO2:  [89 %-96 %] 95 % (08/22 0706)  Intake/Output from previous day: 08/21 0701 - 08/22 0700 In: 2233.8 [P.O.:600; I.V.:633.8; IV Piggyback:1000] Out: 600 [Urine:600] Intake/Output this shift: Total I/O In: 1792.5 [P.O.:240; I.V.:1552.5] Out: -   Physical Exam:  General:alert, cooperative, and appears stated age GI: soft, non tender, normal bowel sounds, no palpable masses, no organomegaly, no inguinal hernia Male genitalia: not done Extremities: extremities normal, atraumatic, no cyanosis or edema  Lab Results: Recent Labs    04/06/23 0449 04/07/23 0448 04/08/23 0418  HGB 8.8* 9.3* 9.2*  HCT 28.7* 31.2* 30.8*   BMET Recent Labs    04/07/23 0448 04/08/23 0418  NA 136 135  K 4.5 4.4  CL 104 105  CO2 25 22  GLUCOSE 113* 101*  BUN 26* 30*  CREATININE 1.53* 1.61*  CALCIUM 11.5* 11.2*   No results for input(s): "LABPT", "INR" in the last 72 hours. No results for input(s): "LABURIN" in the last 72 hours. Results for orders placed or performed during the hospital encounter of 04/05/23  SARS Coronavirus 2 by RT PCR (hospital order, performed in [facility] hospital lab) *cepheid single result test* Anterior Nasal Swab     Status: None   Collection Time: 04/05/23  8:02 PM   Specimen: Anterior Nasal Swab  Result Value Ref Range Status   SARS Coronavirus 2 by RT PCR NEGATIVE NEGATIVE Final    Comment: (NOTE) SARS-CoV-2 target nucleic acids are NOT DETECTED.  The SARS-CoV-2 RNA is generally detectable in upper and lower respiratory specimens during the acute phase of infection. The lowest concentration of  SARS-CoV-2 viral copies this assay can detect is 250 copies / mL. A negative result does not preclude SARS-CoV-2 infection and should not be used as the sole basis for treatment or other patient management decisions.  A negative result may occur with improper specimen collection / handling, submission of specimen other than nasopharyngeal swab, presence of viral mutation(s) within the areas targeted by this assay, and inadequate number of viral copies (<250 copies / mL). A negative result must be combined with clinical observations, patient history, and epidemiological information.  Fact Sheet for Patients:   RoadLapTop.co.za  Fact Sheet for Healthcare Providers: http://kim-miller.com/  This test is not yet approved or  cleared by the [place] FDA and has been authorized for detection and/or diagnosis of SARS-CoV-2 by FDA under an Emergency Use Authorization (EUA).  This EUA will remain in effect (meaning this test can be used) for the duration of the COVID-19 declaration under Section 564(b)(1) of the Act, 21 U.S.C. section 360bbb-3(b)(1), unless the authorization is terminated or revoked sooner.  Performed at [facility], [address] [place], [place] [address]     Studies/Results: No results found.  Assessment/Plan: POD#3 right radical nephrectomy -continue current pain control regiment -continue clear liquid diet -continue respiratory protocol -CBC diff   LOS: 3 days   Wilkie Aye 04/08/2023, 1:49 PM

## 2023-04-08 NOTE — Progress Notes (Signed)
Progress Note   Patient: Joshua Gentry WUJ:[id number] DOB: [birth year] DOA: 04/05/2023     3 DOS: the patient was seen and examined on 04/08/2023   Brief hospital initial consultation note: YARETH BAUTISTA is a 77 y.o. male with past medical history of adrenal mass, coronary artery disease, OSA on CPAP.  Patient was diagnosed with renal mass, after he presented abnormal labs, gross hematuria and AKI and was found to have new metastatic disease- 03/16/23.  Followed up with nephrology on discharge, and underwent nephrectomy today 04/05/23.  Hospitalist was consulted today after surgery due to patient being hypoxic, requiring nonrebreather.  He required general anesthesia and was intubated for the procedure.    Assessment and Plan: * Right renal mass -Presumed metastatic right kidney cancer.   -Status post da Vinci robotic assisted laparoscopic right nephrectomy, by Dr. Ronne Binning.   -Continue outpatient follow-up with oncology service (Dr. Ellin Saba), soon to discuss final diagnosis and treatment plan.  CAP (community acquired pneumonia) -Presenting with dyspnea, hypoxia, chronic cough.   -Patient rules in for severe sepsis with tachypnea, respiratory rate 27-32, and significant worsening leukocytosis peaked at 101.7K -Continue IV antibiotics, steroids, adequate hydration and follow clinical response. -Will also continue mucolytic's, flutter valve, incentive spirometer and bronchodilator management. -Given presence of expiratory wheezing steroids has been initiated -Continue to wean off oxygen supplementation and follow clinical response.    Acute hypoxic respiratory failure (HCC) -Status post XI robotic assisted laparoscopic right nephrectomy.  Post-op patient, hypoxic-sats down to 89% on 4 L, at time of initial consultation requiring nonrebreather to keep sats above 92%.   -Per family he has had chronic dyspnea, over the past several years that has progressed, but over the past several  days/weeks, patient has been increasingly short of breath with minimal activity.  Prior to admission, sats will drop to high 80s with activity.  Mental status intact. Quit smoking cigarettes about 27 years ago. -CT scan/chest x-ray suggesting left lower lobe pneumonia versus atelectasis with chronic emphysematous changes -No fever, WBCs up to 122.6K and patient experiencing tachypnea with activity. -Elevated WBCs could also be now responsible secondary to the use of his steroids. -Will check CBC with differential in AM. -Case discussed with Dr. Ellin Saba. -Continue therapy with antibiotics, steroids and nebulizer management -Continue to wean of oxygen supplementation as tolerated. -Patient May need home O2 on discharge -Continue supportive care.  Hypercalcemia -Hypercalcemia 12.3, in the setting of presumed metastatic kidney cancer.  -Mental status intact -Continue to follow calcium trend -Continue to maintain adequate hydration.   COPD (chronic obstructive pulmonary disease) (HCC) -Positive expiratory wheezing appreciated on examination -Treatment with the steroids will be initiated -Pulmicort and continue use of bronchodilator will be provided. -Mucolytic's and flutter valve has been encouraged -Follow clinical response.  OSA on CPAP -Continue CPAP nightly.  Essential hypertension -Stable blood pressure -Continue current antihypertensive agents.   Subjective:  Weak/deconditioned; reported resting better.  No fever, no chest pain, no nausea vomiting.  Patient seen by speech therapy with recommendations for soft diet with thin liquids.  In no acute distress.  Physical Exam: Vitals:   04/07/23 2051 04/08/23 0443 04/08/23 0706 04/08/23 1354  BP: 123/63 (!) 140/75  126/63  Pulse: 74 73  68  Resp: 18 20  18   Temp: 97.8 F (36.6 C) 97.9 F (36.6 C)  97.9 F (36.6 C)  TempSrc:  Oral  Axillary  SpO2: (!) 89% 96% 95% 90%  Weight:      Height:  General exam: Alert,  awake, oriented x x 1; per wife experiencing intermittent episodes of confusion/sundowning.  Continue to demonstrate fluctuation in oxygen saturation when he is on room air.  2-treated nasal cannula supplementation in place at time of my visit.  No fever. Respiratory system: Improved air movement bilaterally; very little expiratory wheezing appreciated.  No use of accessory muscles noticed.  Positive rhonchi Cardiovascular system:RRR. No murmurs, rubs, gallops. Gastrointestinal system: Abdomen is nondistended, soft and just mild tenderness around incision area.  Positive bowel sounds. Central nervous system: Alert and oriented. No focal neurological deficits. Extremities: No cyanosis or clubbing. Skin: No petechiae. Psychiatry: Mood & affect appropriate.   Data Reviewed: CBC: White blood cells 122.6K, hemoglobin 9.3 and platelet count 3 34K. COVID PCR: Negative Lactic acid: 1.2 Basic metabolic panel: Sodium 136, potassium 4.5, chloride 104, bicarb 25, glucose 113, BUN 26, creatinine 1.53 and calcium 11.5. GFR 47  Family Communication: Wife at bedside.  Disposition: Status is: Inpatient Remains inpatient appropriate because: Continue treatment with IV antibiotics and steroids.  Follow further recommendations postoperatively by primary team.   Planned Discharge Destination: To be determined.  Time spent: 50 minutes  Author: Vassie Loll, MD 04/08/2023 5:45 PM  For on call review [email or web address].

## 2023-04-08 NOTE — Progress Notes (Signed)
   04/08/23 9604  Provider Notification  Provider Name/Title Wilkie Aye  Date Provider Notified 04/08/23  Time Provider Notified [phone]  Method of Notification  (secure chat)  Notification Reason Critical Result  Test performed and critical result WBC 122.8  Date Critical Result Received 04/08/23  Time Critical Result Received [phone]

## 2023-04-09 DIAGNOSIS — G4733 Obstructive sleep apnea (adult) (pediatric): Secondary | ICD-10-CM | POA: Diagnosis not present

## 2023-04-09 DIAGNOSIS — N2889 Other specified disorders of kidney and ureter: Secondary | ICD-10-CM | POA: Diagnosis not present

## 2023-04-09 DIAGNOSIS — J189 Pneumonia, unspecified organism: Secondary | ICD-10-CM | POA: Diagnosis not present

## 2023-04-09 LAB — CBC WITH DIFFERENTIAL/PLATELET
Abs Immature Granulocytes: 0 10*3/uL (ref 0.00–0.07)
Band Neutrophils: 4 %
Basophils Absolute: 0 10*3/uL (ref 0.0–0.1)
Basophils Relative: 0 %
Eosinophils Absolute: 0 10*3/uL (ref 0.0–0.5)
Eosinophils Relative: 0 %
HCT: 31.7 % — ABNORMAL LOW (ref 39.0–52.0)
Hemoglobin: 9.6 g/dL — ABNORMAL LOW (ref 13.0–17.0)
Lymphocytes Relative: 1 %
Lymphs Abs: 1.2 10*3/uL (ref 0.7–4.0)
MCH: 27 pg (ref 26.0–34.0)
MCHC: 30.3 g/dL (ref 30.0–36.0)
MCV: 89.3 fL (ref 80.0–100.0)
Monocytes Absolute: 2.5 10*3/uL — ABNORMAL HIGH (ref 0.1–1.0)
Monocytes Relative: 2 %
Neutro Abs: 118.8 10*3/uL — ABNORMAL HIGH (ref 1.7–7.7)
Neutrophils Relative %: 93 %
Platelets: 340 10*3/uL (ref 150–400)
RBC: 3.55 MIL/uL — ABNORMAL LOW (ref 4.22–5.81)
RDW: 15.9 % — ABNORMAL HIGH (ref 11.5–15.5)
WBC: 122.5 10*3/uL (ref 4.0–10.5)
nRBC: 0 % (ref 0.0–0.2)

## 2023-04-09 MED ORDER — METHYLPREDNISOLONE SODIUM SUCC 40 MG IJ SOLR
40.0000 mg | INTRAMUSCULAR | Status: DC
  Administered 2023-04-10: 40 mg via INTRAVENOUS
  Filled 2023-04-09: qty 1

## 2023-04-09 NOTE — Progress Notes (Signed)
Progress Note   Patient: Joshua Gentry:[id number] DOB: [birth year] DOA: 04/05/2023     4 DOS: the patient was seen and examined on 04/09/2023   Brief hospital initial consultation note: Joshua Gentry is a 77 y.o. male with past medical history of adrenal mass, coronary artery disease, OSA on CPAP.  Patient was diagnosed with renal mass, after he presented abnormal labs, gross hematuria and AKI and was found to have new metastatic disease- 03/16/23.  Followed up with nephrology on discharge, and underwent nephrectomy today 04/05/23.  Hospitalist was consulted today after surgery due to patient being hypoxic, requiring nonrebreather.  He required general anesthesia and was intubated for the procedure.    Assessment and Plan: * Right renal mass -Presumed metastatic right kidney cancer.   -Status post da Vinci robotic assisted laparoscopic right nephrectomy, by Dr. Ronne Binning.   -Continue outpatient follow-up with oncology service (Dr. Ellin Saba), soon to discuss final diagnosis and treatment plan.  CAP (community acquired pneumonia) -Presenting with dyspnea, hypoxia, chronic cough.   -Patient rules in for severe sepsis with tachypnea, respiratory rate 27-32, and significant worsening leukocytosis peaked at 101.7K -Continue IV antibiotics, steroids, adequate hydration and follow clinical response. -Will also continue mucolytic's, flutter valve, incentive spirometer and bronchodilator management. -Given presence of expiratory wheezing steroids has been initiated -Continue to wean off oxygen supplementation and follow clinical response.    Acute hypoxic respiratory failure (HCC) -Status post XI robotic assisted laparoscopic right nephrectomy.  Post-op patient, hypoxic-sats down to 89% on 4 L, at time of initial consultation requiring nonrebreather to keep sats above 92%.   -Per family he has had chronic dyspnea, over the past several years that has progressed, but over the past several  days/weeks, patient has been increasingly short of breath with minimal activity.  Prior to admission, sats will drop to high 80s with activity.  Mental status intact. Quit smoking cigarettes about 27 years ago. -CT scan/chest x-ray suggesting left lower lobe pneumonia versus atelectasis with chronic emphysematous changes -No fever, WBCs up to 122.6K and patient experiencing tachypnea with activity. -Elevated WBCs could also be now responsible secondary to the use of his steroids. -Will check CBC with differential in AM. -Case discussed with Dr. Ellin Saba. -Continue therapy with antibiotics, steroids (starting slow tapering) and nebulizer management -Continue to wean of oxygen supplementation as tolerated. -Patient May need home O2 on discharge -Continue supportive care.  Hypercalcemia -Hypercalcemia 12.3, in the setting of presumed metastatic kidney cancer.  -Mental status intact -Continue to follow calcium trend -Continue to maintain adequate hydration.   COPD (chronic obstructive pulmonary disease) (HCC) -Positive expiratory wheezing appreciated on examination -Treatment with the steroids will be initiated -Pulmicort and continue use of bronchodilator will be provided. -Mucolytic's and flutter valve has been encouraged -Follow clinical response.  OSA on CPAP -Continue CPAP nightly.  Essential hypertension -Stable blood pressure -Continue current antihypertensive agents.   Subjective:  Weak/deconditioned; feeling better and breathing easier.  No chest pain, no nausea, no vomiting  Physical Exam: Vitals:   04/09/23 0340 04/09/23 0830 04/09/23 0845 04/09/23 1312  BP: 112/64   124/62  Pulse: 63  70 70  Resp: 20   19  Temp: (!) 97.1 F (36.2 C)   97.6 F (36.4 C)  TempSrc:    Oral  SpO2: 98% 97%    Weight:      Height:       General exam: Alert, awake, oriented x 2; generally weak, no chest pain, no nausea, no vomiting.  Reports that he is breathing better.  Easily  redirected and oriented. Respiratory system: Scattered rhonchi bilaterally; no wheezing appreciated on today's examination. Cardiovascular system:RRR. No rub or gallop; no JVD. Gastrointestinal system: Abdomen is obese, nondistended, soft and mild tender around incision.  Positive bowel sounds. Central nervous system: Alert and oriented. No focal neurological deficits. Extremities: No cyanosis or clubbing. Skin: No petechiae. Psychiatry: Stable mood.  Data Reviewed: CBC with differential: White blood cells 122.5K, hemoglobin 9.6 and platelet count 340K.  Mainly absolute neutrophils and monocytes driven elevation. COVID PCR: Negative Lactic acid: 1.2 Most recent basic metabolic panel: Sodium 136, potassium 4.5, chloride 104, bicarb 25, glucose 113, BUN 26, creatinine 1.53 and calcium 11.5. GFR 47  Family Communication: Wife at bedside.  Disposition: Status is: Inpatient Remains inpatient appropriate because: Continue treatment with IV antibiotics and steroids.  Follow further recommendations postoperatively by primary team.   Planned Discharge Destination: To be determined.  Time spent: 50 minutes  Author: Vassie Loll, MD 04/09/2023 4:17 PM  For on call review [email or web address].

## 2023-04-09 NOTE — TOC Progression Note (Signed)
Transition of Care [facility]) - Progression Note    Patient Details  Name: Joshua Gentry MRN: [id number] Date of Birth: [birth year]  Transition of Care [facility]) CM/SW Contact  Sherene Plancarte A , RN Phone Number: 04/09/2023, 2:40 PM  Clinical Narrative:    Spoke with spouse and updated her of recommendation to SNF/rehab and she was agreeable. Referred to facilities of choice.    Expected Discharge Plan: Home w Home Health Services Barriers to Discharge: Continued Medical Work up  Expected Discharge Plan and Services In-house Referral: Clinical Social Work   Post Acute Care Choice: Home Health Living arrangements for the past 2 months: Single Family Home                 DME Arranged:  (n/a) DME Agency: [employer] Date DME Agency Contacted: 04/08/23 Time DME Agency Contacted: [phone] Representative spoke with at DME Agency: n/a HH Arranged: PT HH Agency: [facility] Date [facility] Agency Contacted: 04/08/23 Time HH Agency Contacted: 1215 Representative spoke with at [facility] Agency: Kandee Keen   Social Determinants of Health (SDOH) Interventions SDOH Screenings   Food Insecurity: No Food Insecurity (03/22/2023)  Housing: Low Risk  (03/22/2023)  Transportation Needs: No Transportation Needs (03/22/2023)  Utilities: Not At Risk (03/22/2023)  Depression (PHQ2-9): Low Risk  (03/22/2023)  Financial Resource Strain: Low Risk  (03/22/2023)  Tobacco Use: Medium Risk (04/05/2023)    Readmission Risk Interventions     No data to display

## 2023-04-09 NOTE — Plan of Care (Signed)
  Problem: Education: Goal: Knowledge of General Education information will improve Description Including pain rating scale, medication(s)/side effects and non-pharmacologic comfort measures Outcome: Progressing   Problem: Health Behavior/Discharge Planning: Goal: Ability to manage health-related needs will improve Outcome: Progressing   

## 2023-04-09 NOTE — Progress Notes (Signed)
Physical Therapy Treatment Patient Details Name: Joshua Gentry MRN: [id number] DOB: 1945/12/03 Today's Date: 04/09/2023   History of Present Illness Joshua Gentry  has presented today for surgery, with the diagnosis of kidney mass.  The various methods of treatment have been discussed with the patient and family. After consideration of risks, benefits and other options for treatment, the patient has consented to  Procedure(s):  XI ROBOTIC ASSISTED LAPAROSCOPIC NEPHRECTOMY (Right) as a surgical intervention.  The patient's history has been reviewed, patient examined, no change in status, stable for surgery.  I have reviewed the patient's chart and labs.  Questions were answered to the patient's satisfaction.    PT Comments  Patient demonstrates improvement for sitting up at bedside with less assistance, fair/good return for completing BLE ROM/strengthening exercises seated at bedside, unable to stand without AD due to BLE weakness and tolerated taking a few steps at bedside before having to sit due to c/o fatigue and generalized weakness. Patient tolerated sitting up in chair after therapy on 2 LPM with SpO2 at 90% - nursing staff notified.  Patient will benefit from continued skilled physical therapy in hospital and recommended venue below to increase strength, balance, endurance for safe ADLs and gait.     If plan is discharge home, recommend the following: A lot of help with walking and/or transfers;A lot of help with bathing/dressing/bathroom;Assist for transportation;Assistance with cooking/housework   Can travel by private vehicle     Yes  Equipment Recommendations  None recommended by PT    Recommendations for Other Services       Precautions / Restrictions Precautions Precautions: Fall Restrictions Weight Bearing Restrictions: No     Mobility  Bed Mobility Overal bed mobility: Needs Assistance Bed Mobility: Supine to Sit     Supine to sit: Min assist     General bed  mobility comments: increased time, labored movement    Transfers Overall transfer level: Needs assistance Equipment used: Rolling walker (2 wheels) Transfers: Sit to/from Stand, Bed to chair/wheelchair/BSC Sit to Stand: Min assist   Step pivot transfers: Min assist, Mod assist       General transfer comment: unsteady labored movement    Ambulation/Gait Ambulation/Gait assistance: Min assist, Mod assist Gait Distance (Feet): 10 Feet Assistive device: Rolling walker (2 wheels) Gait Pattern/deviations: Decreased step length - right, Decreased stride length, Decreased step length - left Gait velocity: decreased     General Gait Details: limited to a few side steps, steps forward/backward with slow labored movement and limited mostly due to c/o fatigue and BLE weakness   Stairs             Wheelchair Mobility     Tilt Bed    Modified Rankin (Stroke Patients Only)       Balance Overall balance assessment: Needs assistance Sitting-balance support: Feet supported, No upper extremity supported Sitting balance-Leahy Scale: Good Sitting balance - Comments: seated at EOB   Standing balance support: Reliant on assistive device for balance, During functional activity, Bilateral upper extremity supported Standing balance-Leahy Scale: Fair Standing balance comment: using RW                            Cognition Arousal: Alert Behavior During Therapy: WFL for tasks assessed/performed Overall Cognitive Status: Within Functional Limits for tasks assessed  Exercises General Exercises - Lower Extremity Long Arc Quad: Seated, AROM, Strengthening, Both, 10 reps Hip Flexion/Marching: Seated, AROM, Strengthening, Both, 10 reps Toe Raises: Seated, AROM, Strengthening, Both, 10 reps Heel Raises: Seated, AROM, Strengthening, Both, 10 reps    General Comments        Pertinent Vitals/Pain Pain  Assessment Pain Assessment: No/denies pain    Home Living                          Prior Function            PT Goals (current goals can now be found in the care plan section) Acute Rehab PT Goals Patient Stated Goal: return home after rehab PT Goal Formulation: With patient/family Time For Goal Achievement: 04/21/23 Potential to Achieve Goals: Good Progress towards PT goals: Progressing toward goals    Frequency    Min 3X/week      PT Plan      Co-evaluation              AM-PAC PT "6 Clicks" Mobility   Outcome Measure  Help needed turning from your back to your side while in a flat bed without using bedrails?: A Little Help needed moving from lying on your back to sitting on the side of a flat bed without using bedrails?: A Little Help needed moving to and from a bed to a chair (including a wheelchair)?: A Little Help needed standing up from a chair using your arms (e.g., wheelchair or bedside chair)?: A Lot Help needed to walk in hospital room?: A Lot Help needed climbing 3-5 steps with a railing? : A Lot 6 Click Score: 15    End of Session Equipment Utilized During Treatment: Oxygen Activity Tolerance: Patient tolerated treatment well;Patient limited by fatigue Patient left: in chair;with call bell/phone within reach;with family/visitor present Nurse Communication: Mobility status PT Visit Diagnosis: Unsteadiness on feet (R26.81);Other abnormalities of gait and mobility (R26.89);Muscle weakness (generalized) (M62.81)     Time: 1610-9604 PT Time Calculation (min) (ACUTE ONLY): 35 min  Charges:    $Therapeutic Exercise: 8-22 mins $Therapeutic Activity: 8-22 mins PT General Charges $$ ACUTE PT VISIT: 1 Visit                     2:04 PM, 04/09/23 Ocie Bob, MPT Physical Therapist with [facility] [phone] office [phone] mobile phone

## 2023-04-09 NOTE — TOC Progression Note (Incomplete)
Transition of Care [facility]) - Progression Note    Patient Details  Name: Joshua Gentry MRN: [id number] Date of Birth: Jan 14, 1946  Transition of Care [facility]) CM/SW Contact  Miya Luviano A , RN Phone Number: 04/09/2023, 3:39 PM  Clinical Narrative:       Expected Discharge Plan: Home w Home Health Services Barriers to Discharge: Continued Medical Work up  Expected Discharge Plan and Services In-house Referral: Clinical Social Work   Post Acute Care Choice: Home Health Living arrangements for the past 2 months: Single Family Home                 DME Arranged:  (n/a) DME Agency: [employer] Date DME Agency Contacted: 04/08/23 Time DME Agency Contacted: [id number] Representative spoke with at DME Agency: n/a HH Arranged: PT HH Agency: [facility] Date [facility] Agency Contacted: 04/08/23 Time HH Agency Contacted: [phone] Representative spoke with at [facility] Agency: Kandee Keen   Social Determinants of Health (SDOH) Interventions SDOH Screenings   Food Insecurity: No Food Insecurity (03/22/2023)  Housing: Low Risk  (03/22/2023)  Transportation Needs: No Transportation Needs (03/22/2023)  Utilities: Not At Risk (03/22/2023)  Depression (PHQ2-9): Low Risk  (03/22/2023)  Financial Resource Strain: Low Risk  (03/22/2023)  Tobacco Use: Medium Risk (04/05/2023)    Readmission Risk Interventions     No data to display

## 2023-04-09 NOTE — [place] FL2 (Signed)
[place] MEDICAID FL2 LEVEL OF CARE FORM     IDENTIFICATION  Patient Name: Joshua Gentry Birthdate: [birth year] Sex: male Admission Date (Current Location): 04/05/2023  [facility] and [place] Number:  Joshua Gentry and Address:  [facility],  [address] [place] [address]      Provider Number: [id number]  Attending Physician Name and Address:  Joshua Gauze, Gentry  Relative Name and Phone Number:  Joshua Gentry, Joshua Gentry (Spouse)  [phone]    Current Level of Care: Hospital Recommended Level of Care: Skilled Nursing Facility Prior Approval Number:    Date Approved/Denied:   PASRR Number: [id number] Gentry  Discharge Plan: SNF    Current Diagnoses: Patient Active Problem List   Diagnosis Date Noted   Right renal mass 04/05/2023   Acute hypoxic respiratory failure (HCC) 04/05/2023   CAP (community acquired pneumonia) 04/05/2023   COPD (chronic obstructive pulmonary disease) (HCC) 04/05/2023   Hypercalcemia 04/05/2023   Gross hematuria 03/18/2023   Leukocytosis/SIRS 03/16/2023   Normocytic anemia 03/16/2023   Acute renal failure (ARF) (HCC) 03/16/2023   Renal mass 03/16/2023   Multiple lung nodules concerning for metastatic disesae 03/16/2023   OSA on CPAP 04/09/2015   Liposarcoma (HCC) 07/06/2013   RBBB plus LA hemiblock 03/21/2013   Obesity (BMI 30-39.9) 07/22/2009   Dyslipidemia, goal LDL below 70 01/09/2009   Essential hypertension 01/09/2009   Hx of CABG 01/09/2009    Orientation RESPIRATION BLADDER Height & Weight     Self, Place  Other (Comment) (CPAP at night) Incontinent Weight: 107.6 kg Height:  6\' 2"  (188 cm)  BEHAVIORAL SYMPTOMS/MOOD NEUROLOGICAL BOWEL NUTRITION STATUS      Continent Diet (Regular)  AMBULATORY STATUS COMMUNICATION OF NEEDS Skin   Limited Assist Verbally Normal                       Personal Care Assistance Level of Assistance  Bathing, Feeding, Dressing Bathing Assistance: Limited assistance Feeding  assistance: Limited assistance Dressing Assistance: Limited assistance     Functional Limitations Info  Sight, Hearing, Speech Sight Info: Adequate Hearing Info: Adequate Speech Info: Adequate    SPECIAL CARE FACTORS FREQUENCY  PT (By licensed PT)     PT Frequency: 5x per week              Contractures Contractures Info: Not present    Additional Factors Info  Code Status Code Status Info: FULL             Current Medications (04/09/2023):  This is the current hospital active medication list Current Facility-Administered Medications  Medication Dose Route Frequency Provider Last Rate Last Admin   0.9 %  sodium chloride infusion   Intravenous Continuous Joshua Gentry, Joshua Celeste, Gentry 75 mL/hr at 04/08/23 1426 New Bag at 04/08/23 1426   acetaminophen (TYLENOL) tablet 650 mg  650 mg Oral Q4H PRN Joshua Gauze, Gentry       ALPRAZolam Prudy Feeler) tablet 0.5 mg  0.5 mg Oral Q12H PRN Joshua Gentry   0.5 mg at 04/08/23 2028   azithromycin (ZITHROMAX) 500 mg in sodium chloride 0.9 % 250 mL IVPB  500 mg Intravenous Q24H Joshua Gentry, Joshua Gentry, Gentry 250 mL/hr at 04/08/23 2024 500 mg at 04/08/23 2024   budesonide (PULMICORT) nebulizer solution 0.5 mg  0.5 mg Nebulization BID Joshua Gentry   0.5 mg at 04/09/23 0830   cefTRIAXone (ROCEPHIN) 2 g in sodium chloride 0.9 % 100 mL IVPB  2 g Intravenous Q24H Joshua Gentry 200 mL/hr at 04/08/23 2024 2 g at 04/08/23 2024   Chlorhexidine Gluconate Cloth 2 % PADS 6 each  6 each Topical Daily Joshua Gauze, Gentry   6 each at 04/09/23 0849   diphenhydrAMINE (BENADRYL) injection 12.5 mg  12.5 mg Intravenous Q6H PRN Joshua Gauze, Gentry       Or   diphenhydrAMINE (BENADRYL) 12.5 MG/5ML elixir 12.5 mg  12.5 mg Oral Q6H PRN Joshua Gauze, Gentry   12.5 mg at 04/08/23 1532   HYDROmorphone (DILAUDID) injection 0.5-1 mg  0.5-1 mg Intravenous Q2H PRN Joshua Gentry, Joshua Celeste, Gentry       ipratropium-albuterol (DUONEB) 0.5-2.5 (3) MG/3ML  nebulizer solution 3 mL  3 mL Nebulization Q4H PRN Joshua Gentry, Joshua Gentry, Gentry       methylPREDNISolone sodium succinate (SOLU-MEDROL) 40 mg/mL injection 40 mg  40 mg Intravenous Q12H Joshua Gentry   40 mg at 04/09/23 4742   metoprolol succinate (TOPROL-XL) 24 hr tablet 100 mg  100 mg Oral Daily Joshua Gauze, Gentry   100 mg at 04/09/23 0845   ondansetron (ZOFRAN) injection 4 mg  4 mg Intravenous Q4H PRN Joshua Gauze, Gentry       oxyCODONE (Oxy IR/ROXICODONE) immediate release tablet 5 mg  5 mg Oral Q4H PRN Joshua Gauze, Gentry   5 mg at 04/08/23 2025   rosuvastatin (CRESTOR) tablet 20 mg  20 mg Oral Daily Joshua Gauze, Gentry   20 mg at 04/09/23 0845   senna-docusate (Senokot-S) tablet 2 tablet  2 tablet Oral QHS Joshua Gauze, Gentry   2 tablet at 04/08/23 2100     Discharge Medications: Please see discharge summary for Gentry list of discharge medications.  Relevant Imaging Results:  Relevant Lab Results:   Additional Information VZD:[id number]  Joshua Gentry , RN

## 2023-04-10 ENCOUNTER — Other Ambulatory Visit: Payer: Self-pay

## 2023-04-10 DIAGNOSIS — G9341 Metabolic encephalopathy: Secondary | ICD-10-CM

## 2023-04-10 DIAGNOSIS — N2889 Other specified disorders of kidney and ureter: Secondary | ICD-10-CM | POA: Diagnosis not present

## 2023-04-10 LAB — TSH: TSH: 1.337 u[IU]/mL (ref 0.350–4.500)

## 2023-04-10 LAB — BASIC METABOLIC PANEL
Anion gap: 7 (ref 5–15)
BUN: 32 mg/dL — ABNORMAL HIGH (ref 8–23)
CO2: 24 mmol/L (ref 22–32)
Calcium: 11.4 mg/dL — ABNORMAL HIGH (ref 8.9–10.3)
Chloride: 107 mmol/L (ref 98–111)
Creatinine, Ser: 1.51 mg/dL — ABNORMAL HIGH (ref 0.61–1.24)
GFR, Estimated: 48 mL/min — ABNORMAL LOW (ref >60)
Glucose, Bld: 92 mg/dL (ref 70–99)
Potassium: 3.7 mmol/L (ref 3.5–5.1)
Sodium: 138 mmol/L (ref 135–145)

## 2023-04-10 LAB — ALBUMIN: Albumin: 2.2 g/dL — ABNORMAL LOW (ref 3.5–5.0)

## 2023-04-10 LAB — VITAMIN D 25 HYDROXY (VIT D DEFICIENCY, FRACTURES): Vit D, 25-Hydroxy: 49.99 ng/mL (ref 30–100)

## 2023-04-10 MED ORDER — MELATONIN 3 MG PO TABS
6.0000 mg | ORAL_TABLET | Freq: Once | ORAL | Status: AC
  Administered 2023-04-10: 6 mg via ORAL
  Filled 2023-04-10: qty 2

## 2023-04-10 MED ORDER — TRAZODONE HCL 50 MG PO TABS
50.0000 mg | ORAL_TABLET | Freq: Every day | ORAL | Status: DC
  Administered 2023-04-10: 50 mg via ORAL
  Filled 2023-04-10: qty 1

## 2023-04-10 MED ORDER — HYDROMORPHONE HCL 1 MG/ML IJ SOLN: 0.5000 mg | INTRAMUSCULAR | Status: DC | PRN

## 2023-04-10 MED ORDER — OXYCODONE HCL 5 MG PO TABS: 5.0000 mg | ORAL_TABLET | Freq: Two times a day (BID) | ORAL | Status: DC | PRN

## 2023-04-10 MED ORDER — FUROSEMIDE 10 MG/ML IJ SOLN
40.0000 mg | Freq: Once | INTRAMUSCULAR | Status: AC
  Administered 2023-04-10: 40 mg via INTRAVENOUS
  Filled 2023-04-10: qty 4

## 2023-04-10 NOTE — Progress Notes (Signed)
PROGRESS NOTE   Joshua Gentry  JWJ:[id number]    DOB: [birth year]    DOA: 04/05/2023  PCP: Assunta Found, MD   I have briefly reviewed patients previous medical records in [facility].   Brief Narrative:  77 year old married male with PMH of CAD s/p CABG 1997, MI, HLD, HTN, obesity, OSA on CPAP, sarcoma, hospitalized 7/30-8/1 when he presented with gross hematuria, workup revealed right renal mass concerning for metastatic RCC, suspected pulmonary metastasis, seen by urology and planned elective surgery.  He underwent right robotic assisted laparoscopic radical nephrectomy on 8/19.  Postop, TRH was consulted due to hypoxia requiring nonrebreather mask.  He required general anesthesia and was intubated for the procedure.  Acute respiratory failure with hypoxia felt to be due to pneumonia.   Assessment & Plan:  Principal Problem:   Right renal mass Active Problems:   Acute hypoxic respiratory failure (HCC)   CAP (community acquired pneumonia)   COPD (chronic obstructive pulmonary disease) (HCC)   Hypercalcemia   Essential hypertension   OSA on CPAP   Right renal mass Presumed metastatic RCC with pulmonary mets. S/p right robotic assisted laparoscopic right nephrectomy 8/19 by Dr. Ronne Binning, urology Supposed to follow-up with medical oncology as outpatient, Dr. Ellin Saba to discuss final diagnosis and treatment options. Pathology still pending.  Lobar pneumonia: Could be community-acquired versus aspiration pneumonia from procedure and anesthesia. Chest x-ray 8/19: Confluent density at the left lung base concerning for LLL pneumonia or atelectasis.  Severe emphysema.  Numerous scattered pulmonary nodules, concentrated at lung bases. Met severe sepsis criteria, not present on admission, developed postop Treating with IV azithromycin 8/19 >, IV ceftriaxone 8/19 >.  Since completed 5 dose course, DC antibiotics. Continue aggressive pulmonary toilet with incentive spirometry, Also  treated with IV Solu-Medrol, reduced today from 40 mg twice daily to 40 mg daily.  Acute respiratory failure with hypoxia: Postop, oxygen saturations dropped to 89% on 4 L.  At time of initial consultation, required nonrebreather mask to keep oxygen saturations above 92%. -Per family he has had chronic dyspnea, over the past several years that has progressed, but over the past several days/weeks, patient has been increasingly short of breath with minimal activity.  Prior to admission, sats will drop to high 80s with activity.   - Quit smoking cigarettes about 27 years ago. -CT scan/chest x-ray suggesting left lower lobe pneumonia versus atelectasis with chronic emphysematous changes -Acute respiratory failure likely multifactorial related to procedure/anesthesia, possible pneumonia, atelectasis, pleural effusions, COPD, underlying OSA and physical deconditioning. -Has finished 5 days of IV steroids, no clinical bronchospasm, not sure if there is any continued benefit.  Moreover patient has significant leukocytosis and AMS possibly from steroids.  DC steroids and observe. -Wean oxygen as tolerated.  Will need home O2 eval prior to discharge.  Marked leukocytosis: -Has had significant leukocytosis since 7/30 but up to 8/1 it was in the 20K range. -Peaked to 122K range lately.  Etiology not entirely clear.  Could be related to stress response, steroids but other etiologies need to be considered. -Prior [facility] MD discussed his case with Dr. Ellin Saba, oncology. -No CBC today, will check tomorrow.  Hopefully stopping steroids should help as well.  Anemia Stable  Hypercalcemia -In the context of presumed metastatic RCC -Serum calciums were normal until 8/1 but since 8/5, increased and over the last several days uncorrected serum calcium stable in the low 11 range -Serum calcium corrected to low albumin (12.8 on 8/24) -Check vitamin D, PTH, PTH RP,  TSH -Patient appears volume overloaded and will give  a dose of IV Lasix 40 mg x 1 which may not only help his edema, respiratory status but also his serum calcium.  Stage IIIb CKD Creatinine has been stable in the 1.5-1.6 range since 8/19 Outpatient follow-up with nephrology.    COPD (chronic obstructive pulmonary disease) (HCC) -Management as above.  Discontinue both IV antibiotics since she has completed 5 days course for pneumonia, discontinue IV steroids also has completed 5 days course. -Continue nebs and monitor closely.   OSA on CPAP -Continue CPAP nightly.  Patient reportedly has been refusing his CPAP over the last couple of days as per his spouse.  Counseled patient that he must use it to minimize risks.   Essential hypertension -Stable blood pressure -Continue current antihypertensive agents  Acute metabolic encephalopathy Likely multifactorial related to hospital delirium, hypoxia, steroids, other meds, sleep cycle reversal etc. Delirium precautions Minimize sedatives and opiates  Body mass index is 30.46 kg/m./Obesity Noted.   ACP Documents: HCPOA papers appreciated DVT prophylaxis: SCDs Start: 04/05/23 1905     Code Status: Full Code:  Family Communication: Spouse at bedside Disposition:  Status is: Inpatient Remains inpatient appropriate because: Active management as above     Consultants:   TRH are consultants  Procedures:   As above  Antimicrobials:   As above   Subjective:  Seen this morning.  As per spouse at bedside, has been confused for the last 1 to 2 weeks but more so in the last couple days, refusing to use CPAP at night for the last couple days.  Dyspneic and hypoxic on minimal exertion with O2 sats in the 80s per her.  No pain reported.  Tolerating diet but not eating much.  Objective:   Vitals:   04/10/23 0602 04/10/23 0904 04/10/23 1005 04/10/23 1206  BP: 137/81 128/76  127/60  Pulse: 69 65  65  Resp: 18   20  Temp: (!) 97.4 F (36.3 C)   (!) 97.3 F (36.3 C)  TempSrc: Oral    Axillary  SpO2: 95%  91% 95%  Weight:      Height:        General exam: Elderly male, moderately built and obese lying comfortably propped up in bed without distress. Respiratory system: Diminished breath sounds in the bases but rest of lung fields clear to auscultation without wheezing or rhonchi.  No overt crackles.  No increased work of breathing. Cardiovascular system: S1 & S2 heard, RRR. No JVD, murmurs, rubs, gallops or clicks.  1+ bilateral ankle edema Gastrointestinal system: Abdomen is nondistended but obese, soft and nontender. No organomegaly or masses felt. Normal bowel sounds heard.  Surgical site dressing clean and dry.  Large area of bruising over RLQ extending to the right lower back. Central nervous system: Alert and oriented x 2. No focal neurological deficits. Extremities: Symmetric 5 x 5 power. Skin: No rashes, lesions or ulcers Psychiatry: Judgement and insight appear impaired. Mood & affect appropriate.     Data Reviewed:   I have personally reviewed following labs and imaging studies   CBC: Recent Labs  Lab 04/08/23 0418 04/08/23 1422 04/09/23 0423  WBC 122.8* 122.6* 122.5*  NEUTROABS  --  115.2* 118.8*  HGB 9.2* 9.3* 9.6*  HCT 30.8* 29.9* 31.7*  MCV 90.3 86.9 89.3  PLT 276 315 340    Basic Metabolic Panel: Recent Labs  Lab 04/05/23 1627 04/06/23 0449 04/07/23 0448 04/08/23 0418 04/10/23 0208  NA 133* 135 136  135 138  K 4.4 4.5 4.5 4.4 3.7  CL 103 104 104 105 107  CO2 22 25 25 22 24   GLUCOSE 176* 124* 113* 101* 92  BUN 23 24* 26* 30* 32*  CREATININE 1.51* 1.63* 1.53* 1.61* 1.51*  CALCIUM 12.3* 11.4* 11.5* 11.2* 11.4*    Liver Function Tests: Recent Labs  Lab 04/05/23 1627 04/10/23 0208  AST 31  --   ALT 28  --   ALKPHOS 193*  --   BILITOT 0.8  --   PROT 6.0*  --   ALBUMIN 2.5* 2.2*    CBG: No results for input(s): "GLUCAP" in the last 168 hours.  Microbiology Studies:   Recent Results (from the past 240 hour(s))  SARS  Coronavirus 2 by RT PCR (hospital order, performed in [facility] hospital lab) *cepheid single result test* Anterior Nasal Swab     Status: None   Collection Time: 04/05/23  8:02 PM   Specimen: Anterior Nasal Swab  Result Value Ref Range Status   SARS Coronavirus 2 by RT PCR NEGATIVE NEGATIVE Final    Comment: (NOTE) SARS-CoV-2 target nucleic acids are NOT DETECTED.  The SARS-CoV-2 RNA is generally detectable in upper and lower respiratory specimens during the acute phase of infection. The lowest concentration of SARS-CoV-2 viral copies this assay can detect is 250 copies / mL. A negative result does not preclude SARS-CoV-2 infection and should not be used as the sole basis for treatment or other patient management decisions.  A negative result may occur with improper specimen collection / handling, submission of specimen other than nasopharyngeal swab, presence of viral mutation(s) within the areas targeted by this assay, and inadequate number of viral copies (<250 copies / mL). A negative result must be combined with clinical observations, patient history, and epidemiological information.  Fact Sheet for Patients:   RoadLapTop.co.za  Fact Sheet for Healthcare Providers: http://kim-miller.com/  This test is not yet approved or  cleared by the [place] FDA and has been authorized for detection and/or diagnosis of SARS-CoV-2 by FDA under an Emergency Use Authorization (EUA).  This EUA will remain in effect (meaning this test can be used) for the duration of the COVID-19 declaration under Section 564(b)(1) of the Act, 21 U.S.C. section 360bbb-3(b)(1), unless the authorization is terminated or revoked sooner.  Performed at [facility], [address] [place], [place] [address]     Radiology Studies:  No results found.  Scheduled Meds:    budesonide (PULMICORT) nebulizer solution  0.5 mg Nebulization BID   Chlorhexidine  Gluconate Cloth  6 each Topical Daily   methylPREDNISolone (SOLU-MEDROL) injection  40 mg Intravenous Q24H   metoprolol succinate  100 mg Oral Daily   rosuvastatin  20 mg Oral Daily   senna-docusate  2 tablet Oral QHS    Continuous Infusions:    azithromycin 500 mg (04/09/23 1935)   cefTRIAXone (ROCEPHIN)  IV 2 g (04/09/23 2100)     LOS: 5 days     Marcellus Scott, MD,  FACP, [facility], [facility], [facility], [facility]   [facility] [place]      To contact the attending provider between 7A-7P or the covering provider during after hours 7P-7A, please log into the web site [email or web address] and access using universal [place] password for that web site. If you do not have the password, please call the hospital operator.  04/10/2023, 4:43 PM

## 2023-04-10 NOTE — Progress Notes (Signed)
S: Joshua Gentry without complaint.  Tolerating a regular diet.  O: Vitals:   04/10/23 1005 04/10/23 1206  BP:  127/60  Pulse:  65  Resp:  20  Temp:  (!) 97.3 F (36.3 C)  SpO2: 91% 95%   He looks well, speaking to his wife. Abdomen soft and nontender, incisions are clean dry and intact.  CBC    Component Value Date/Time   WBC 122.5 (HH) 04/09/2023 0423   RBC 3.55 (L) 04/09/2023 0423   HGB 9.6 (L) 04/09/2023 0423   HCT 31.7 (L) 04/09/2023 0423   PLT 340 04/09/2023 0423   MCV 89.3 04/09/2023 0423   MCH 27.0 04/09/2023 0423   MCHC 30.3 04/09/2023 0423   RDW 15.9 (H) 04/09/2023 0423   LYMPHSABS 1.2 04/09/2023 0423   MONOABS 2.5 (H) 04/09/2023 0423   EOSABS 0.0 04/09/2023 0423   BASOSABS 0.0 04/09/2023 0423   Lab Results  Component Value Date   CREATININE 1.51 (H) 04/10/2023   CREATININE 1.61 (H) 04/08/2023   CREATININE 1.53 (H) 04/07/2023     Reviewed bone scan findings with patient and his wife. Path pending.  Assessment/plan: Postop day 5 palliative right radical nephrectomy- -Tolerating a regular diet, saline lock IV -Continued medical issues. -Appreciate excellent TRH care.

## 2023-04-11 DIAGNOSIS — G9341 Metabolic encephalopathy: Secondary | ICD-10-CM | POA: Diagnosis not present

## 2023-04-11 DIAGNOSIS — N2889 Other specified disorders of kidney and ureter: Secondary | ICD-10-CM | POA: Diagnosis not present

## 2023-04-11 LAB — BLOOD GAS, VENOUS
Acid-Base Excess: 8.2 mmol/L — ABNORMAL HIGH (ref 0.0–2.0)
Bicarbonate: 32.8 mmol/L — ABNORMAL HIGH (ref 20.0–28.0)
Drawn by: 44828
O2 Saturation: 24.8 %
Patient temperature: 36.6
pCO2, Ven: 43 mmHg — ABNORMAL LOW (ref 44–60)
pH, Ven: 7.49 — ABNORMAL HIGH (ref 7.25–7.43)
pO2, Ven: <31 mmHg — CL (ref 32–45)

## 2023-04-11 LAB — CBC
HCT: 37 % — ABNORMAL LOW (ref 39.0–52.0)
Hemoglobin: 11.4 g/dL — ABNORMAL LOW (ref 13.0–17.0)
MCH: 26.8 pg (ref 26.0–34.0)
MCHC: 30.8 g/dL (ref 30.0–36.0)
MCV: 86.9 fL (ref 80.0–100.0)
Platelets: 323 10*3/uL (ref 150–400)
RBC: 4.26 MIL/uL (ref 4.22–5.81)
RDW: 16.6 % — ABNORMAL HIGH (ref 11.5–15.5)
WBC: 101.2 10*3/uL (ref 4.0–10.5)
nRBC: 0.1 % (ref 0.0–0.2)

## 2023-04-11 LAB — BASIC METABOLIC PANEL
Anion gap: 8 (ref 5–15)
BUN: 29 mg/dL — ABNORMAL HIGH (ref 8–23)
CO2: 25 mmol/L (ref 22–32)
Calcium: 11.7 mg/dL — ABNORMAL HIGH (ref 8.9–10.3)
Chloride: 103 mmol/L (ref 98–111)
Creatinine, Ser: 1.46 mg/dL — ABNORMAL HIGH (ref 0.61–1.24)
GFR, Estimated: 50 mL/min — ABNORMAL LOW (ref >60)
Glucose, Bld: 95 mg/dL (ref 70–99)
Potassium: 3.2 mmol/L — ABNORMAL LOW (ref 3.5–5.1)
Sodium: 136 mmol/L (ref 135–145)

## 2023-04-11 MED ORDER — QUETIAPINE FUMARATE 25 MG PO TABS
25.0000 mg | ORAL_TABLET | Freq: Every evening | ORAL | Status: DC
  Administered 2023-04-11: 25 mg via ORAL
  Filled 2023-04-11: qty 1

## 2023-04-11 MED ORDER — POTASSIUM CHLORIDE CRYS ER 20 MEQ PO TBCR
40.0000 meq | EXTENDED_RELEASE_TABLET | Freq: Once | ORAL | Status: AC
  Administered 2023-04-11: 40 meq via ORAL
  Filled 2023-04-11: qty 2

## 2023-04-11 MED ORDER — ALPRAZOLAM 0.5 MG PO TABS: 0.5000 mg | ORAL_TABLET | Freq: Two times a day (BID) | ORAL | Status: DC | PRN

## 2023-04-11 MED ORDER — FUROSEMIDE 10 MG/ML IJ SOLN
40.0000 mg | Freq: Once | INTRAMUSCULAR | Status: AC
  Administered 2023-04-11: 40 mg via INTRAVENOUS
  Filled 2023-04-11: qty 4

## 2023-04-11 NOTE — Progress Notes (Addendum)
PROGRESS NOTE   Joshua Gentry  NFA:[id number]    DOB: 1946/03/17    DOA: 04/05/2023  PCP: Assunta Found, MD   I have briefly reviewed patients previous medical records in [facility].   Brief Narrative:  77 year old married male with PMH of CAD s/p CABG 1997, MI, HLD, HTN, obesity, OSA on CPAP, sarcoma, hospitalized 7/30-8/1 when he presented with gross hematuria, workup revealed right renal mass concerning for metastatic RCC, suspected pulmonary metastasis, seen by urology and planned elective surgery.  He underwent right robotic assisted laparoscopic radical nephrectomy on 8/19.  Postop, TRH was consulted due to hypoxia requiring nonrebreather mask.  He required general anesthesia and was intubated for the procedure.  Acute respiratory failure with hypoxia felt to be due to pneumonia.  Course complicated by acute metabolic encephalopathy.   Assessment & Plan:  Principal Problem:   Right renal mass Active Problems:   Acute hypoxic respiratory failure (HCC)   CAP (community acquired pneumonia)   COPD (chronic obstructive pulmonary disease) (HCC)   Hypercalcemia   Essential hypertension   OSA on CPAP   Right renal mass Presumed metastatic RCC with pulmonary mets. S/p right robotic assisted laparoscopic right nephrectomy 8/19 by Dr. Ronne Binning, urology Supposed to follow-up with medical oncology as outpatient, Dr. Ellin Saba to discuss final diagnosis and treatment options. Pathology still pending.  Lobar pneumonia: Could be community-acquired versus aspiration pneumonia from procedure and anesthesia. Chest x-ray 8/19: Confluent density at the left lung base concerning for LLL pneumonia or atelectasis.  Severe emphysema.  Numerous scattered pulmonary nodules, concentrated at lung bases. Met severe sepsis criteria, not present on admission, developed postop Complete 5 days course of IV ceftriaxone and azithromycin. Continue aggressive pulmonary toilet with incentive  spirometry, Also treated with IV Solu-Medrol x 5 days, discontinued Clinically improved Recommend repeating chest x-ray to ensure resolution of pneumonia findings.  Acute respiratory failure with hypoxia: Postop, oxygen saturations dropped to 89% on 4 L.  At time of initial consultation, required nonrebreather mask to keep oxygen saturations above 92%. -Per family he has had chronic dyspnea, over the past several years that has progressed, but over the past several days/weeks, patient has been increasingly short of breath with minimal activity.  Prior to admission, sats will drop to high 80s with activity.   - Quit smoking cigarettes about 27 years ago. -CT scan/chest x-ray suggesting left lower lobe pneumonia versus atelectasis with chronic emphysematous changes -Acute respiratory failure likely multifactorial related to procedure/anesthesia, possible pneumonia, atelectasis, pleural effusions, COPD, underlying OSA and physical deconditioning. -Completed 5 days course of IV steroids and IV antibiotics -Wean oxygen as tolerated.  Will need home O2 eval prior to discharge.  Noted intermittently on oxygen today.  Marked leukocytosis: -Has had significant leukocytosis since 7/30 but up to 8/1 it was in the 20K range. -Peaked to 122K range lately.  Etiology not entirely clear.  Could be related to stress response, steroids but other etiologies need to be considered. -Prior [facility] MD discussed his case with Dr. Ellin Saba, oncology.  TRH MD kindly to reach out to Dr. Ellin Saba on 8/26. -WBC count down to 101, may be due to stopping steroids.  Continue to trend daily CBCs.  Anemia Stable  Hypercalcemia -In the context of presumed metastatic RCC -Serum calciums were normal until 8/1 but since 8/5, increased and over the last several days uncorrected serum calcium stable in the low 11 range -Serum calcium corrected to low albumin (12.8 on 8/24) - TSH (1.337), vitamin D 25-hydroxy:  49.99.  PTH and PTH  RP: Pending. -Patient appears volume overloaded and diuresing with IV Lasix.  Received 40 mg IV Lasix daily since 8/24 -Excellent diuresis.  If serum calcium does not trend down, will likely need bisphosphonates i.e. Zometa  Hypokalemia Likely secondary to Lasix diuresis.  Replace and follow.  Stage IIIb CKD Creatinine has been stable in the 1.5-1.6 range since 8/19 Outpatient follow-up with nephrology.    COPD (chronic obstructive pulmonary disease) (HCC) -No clinical bronchospasm.   OSA on CPAP -Continue CPAP nightly.  Patient reportedly has been refusing his CPAP over the last couple of days as per his spouse.  Counseled patient that he must use it to minimize risks.   Essential hypertension -Stable blood pressure -Continue current antihypertensive agents  Acute metabolic encephalopathy Likely multifactorial related to hospital delirium, hypoxia, steroids, other meds, sleep cycle reversal, hypercalcemia etc. Delirium precautions Minimize sedatives and opiates Per spouse, despite bedtime trazodone, slept for 2 hours, overnight was argumentative, agitated, combative with son DC trazodone and trial of Seroquel 25 Mg every afternoon Discussed in detail with spouse and advised her that this multifactorial AMS is not unusual, treated supportively, course is unpredictable and could last a couple days to several weeks.  She verbalized understanding. Check VBG and no CO2 retention  Body mass index is 31.65 kg/m./Obesity Noted.   ACP Documents: HCPOA papers appreciated DVT prophylaxis: SCDs Start: 04/05/23 1905     Code Status: Full Code:  Family Communication: Spouse at bedside today. Disposition:  Status is: Inpatient Pending continued stability, could consider DC to SNF in the next 1 to 2 days.     Consultants:   TRH are consultants  Procedures:   As above  Antimicrobials:   As above   Subjective:  Patient denies complaints.  Pleasantly confused.  As per spouse at  bedside, son stays with patient at night.  Last night continued to be argumentative, combative and agitated.  With medications may have slept about 2 hours but kept getting up.  Soft BMs.  Despite last BM documented on 8/21, does not want [place] to escalate bowel regimen at this time.  Feels that he has not been eating much which may be contributing.  Urinating well.  She does not think that she can handle him at home and feels that SNF for rehab is appropriate.  Did not use CPAP last night.  She indicates that at night he is a different person and much more coherent and pleasant in the daytime.  Objective:   Vitals:   04/11/23 0510 04/11/23 0847 04/11/23 0857 04/11/23 1233  BP: 128/74 127/79  116/75  Pulse: 70 89  79  Resp: 16   20  Temp: 97.8 F (36.6 C)   97.6 F (36.4 C)  TempSrc: Oral   Oral  SpO2: 94%  (!) 87% 100%  Weight: 111.8 kg     Height:        General exam: Elderly male, moderately built and obese sitting up comfortably in reclining chair.  Intermittently dozing off. Respiratory system: Slightly diminished breath sounds in the bases but otherwise clear to auscultation. Cardiovascular system: S1 & S2 heard, RRR. No JVD, murmurs, rubs, gallops or clicks.  1+ bilateral ankle edema-not much change since yesterday. Gastrointestinal system: Abdomen is nondistended but obese, soft and nontender. No organomegaly or masses felt. Normal bowel sounds heard.  Surgical site dressing clean and dry.  Large area of bruising over RLQ extending to the right lower back. Central nervous system:  Alert and oriented x 2. No focal neurological deficits. Extremities: Symmetric 5 x 5 power. Skin: No rashes, lesions or ulcers Psychiatry: Judgement and insight appear impaired. Mood & affect appropriate.     Data Reviewed:   I have personally reviewed following labs and imaging studies   CBC: Recent Labs  Lab 04/08/23 1422 04/09/23 0423 04/11/23 0607  WBC 122.6* 122.5* 101.2*  NEUTROABS 115.2*  118.8*  --   HGB 9.3* 9.6* 11.4*  HCT 29.9* 31.7* 37.0*  MCV 86.9 89.3 86.9  PLT 315 340 323    Basic Metabolic Panel: Recent Labs  Lab 04/06/23 0449 04/07/23 0448 04/08/23 0418 04/10/23 0208 04/11/23 0607  NA 135 136 135 138 136  K 4.5 4.5 4.4 3.7 3.2*  CL 104 104 105 107 103  CO2 25 25 22 24 25   GLUCOSE 124* 113* 101* 92 95  BUN 24* 26* 30* 32* 29*  CREATININE 1.63* 1.53* 1.61* 1.51* 1.46*  CALCIUM 11.4* 11.5* 11.2* 11.4* 11.7*    Liver Function Tests: Recent Labs  Lab 04/05/23 1627 04/10/23 0208  AST 31  --   ALT 28  --   ALKPHOS 193*  --   BILITOT 0.8  --   PROT 6.0*  --   ALBUMIN 2.5* 2.2*    CBG: No results for input(s): "GLUCAP" in the last 168 hours.  Microbiology Studies:   Recent Results (from the past 240 hour(s))  SARS Coronavirus 2 by RT PCR (hospital order, performed in [facility] hospital lab) *cepheid single result test* Anterior Nasal Swab     Status: None   Collection Time: 04/05/23  8:02 PM   Specimen: Anterior Nasal Swab  Result Value Ref Range Status   SARS Coronavirus 2 by RT PCR NEGATIVE NEGATIVE Final    Comment: (NOTE) SARS-CoV-2 target nucleic acids are NOT DETECTED.  The SARS-CoV-2 RNA is generally detectable in upper and lower respiratory specimens during the acute phase of infection. The lowest concentration of SARS-CoV-2 viral copies this assay can detect is 250 copies / mL. A negative result does not preclude SARS-CoV-2 infection and should not be used as the sole basis for treatment or other patient management decisions.  A negative result may occur with improper specimen collection / handling, submission of specimen other than nasopharyngeal swab, presence of viral mutation(s) within the areas targeted by this assay, and inadequate number of viral copies (<250 copies / mL). A negative result must be combined with clinical observations, patient history, and epidemiological information.  Fact Sheet for Patients:    RoadLapTop.co.za  Fact Sheet for Healthcare Providers: http://kim-miller.com/  This test is not yet approved or  cleared by the [place] FDA and has been authorized for detection and/or diagnosis of SARS-CoV-2 by FDA under an Emergency Use Authorization (EUA).  This EUA will remain in effect (meaning this test can be used) for the duration of the COVID-19 declaration under Section 564(b)(1) of the Act, 21 U.S.C. section 360bbb-3(b)(1), unless the authorization is terminated or revoked sooner.  Performed at [facility], [address], [place], [place] [address]     Radiology Studies:  No results found.  Scheduled Meds:    budesonide (PULMICORT) nebulizer solution  0.5 mg Nebulization BID   Chlorhexidine Gluconate Cloth  6 each Topical Daily   metoprolol succinate  100 mg Oral Daily   QUEtiapine  25 mg Oral QPM   rosuvastatin  20 mg Oral Daily   senna-docusate  2 tablet Oral QHS    Continuous Infusions:  LOS: 6 days     Marcellus Scott, MD,  FACP, [facility], [facility], [facility], [facility]   [facility] [place]     To contact the attending provider between 7A-7P or the covering provider during after hours 7P-7A, please log into the web site [email or web address] and access using universal [place] password for that web site. If you do not have the password, please call the hospital operator.  04/11/2023, 4:59 PM

## 2023-04-12 DIAGNOSIS — G9340 Encephalopathy, unspecified: Secondary | ICD-10-CM | POA: Diagnosis not present

## 2023-04-12 DIAGNOSIS — D631 Anemia in chronic kidney disease: Secondary | ICD-10-CM | POA: Diagnosis not present

## 2023-04-12 DIAGNOSIS — C7801 Secondary malignant neoplasm of right lung: Secondary | ICD-10-CM | POA: Diagnosis not present

## 2023-04-12 DIAGNOSIS — G4733 Obstructive sleep apnea (adult) (pediatric): Secondary | ICD-10-CM | POA: Diagnosis not present

## 2023-04-12 DIAGNOSIS — R0902 Hypoxemia: Secondary | ICD-10-CM | POA: Diagnosis not present

## 2023-04-12 DIAGNOSIS — C7802 Secondary malignant neoplasm of left lung: Secondary | ICD-10-CM | POA: Diagnosis not present

## 2023-04-12 DIAGNOSIS — R069 Unspecified abnormalities of breathing: Secondary | ICD-10-CM | POA: Diagnosis not present

## 2023-04-12 DIAGNOSIS — Z6831 Body mass index (BMI) 31.0-31.9, adult: Secondary | ICD-10-CM | POA: Diagnosis not present

## 2023-04-12 DIAGNOSIS — M6281 Muscle weakness (generalized): Secondary | ICD-10-CM | POA: Diagnosis not present

## 2023-04-12 DIAGNOSIS — N1832 Chronic kidney disease, stage 3b: Secondary | ICD-10-CM | POA: Diagnosis not present

## 2023-04-12 DIAGNOSIS — I480 Paroxysmal atrial fibrillation: Secondary | ICD-10-CM | POA: Diagnosis not present

## 2023-04-12 DIAGNOSIS — I4891 Unspecified atrial fibrillation: Secondary | ICD-10-CM | POA: Diagnosis not present

## 2023-04-12 DIAGNOSIS — R0603 Acute respiratory distress: Secondary | ICD-10-CM | POA: Diagnosis not present

## 2023-04-12 DIAGNOSIS — I455 Other specified heart block: Secondary | ICD-10-CM | POA: Diagnosis not present

## 2023-04-12 DIAGNOSIS — Z66 Do not resuscitate: Secondary | ICD-10-CM | POA: Diagnosis not present

## 2023-04-12 DIAGNOSIS — R2689 Other abnormalities of gait and mobility: Secondary | ICD-10-CM | POA: Diagnosis not present

## 2023-04-12 DIAGNOSIS — E785 Hyperlipidemia, unspecified: Secondary | ICD-10-CM | POA: Diagnosis not present

## 2023-04-12 DIAGNOSIS — I130 Hypertensive heart and chronic kidney disease with heart failure and stage 1 through stage 4 chronic kidney disease, or unspecified chronic kidney disease: Secondary | ICD-10-CM | POA: Diagnosis not present

## 2023-04-12 DIAGNOSIS — J189 Pneumonia, unspecified organism: Secondary | ICD-10-CM | POA: Diagnosis not present

## 2023-04-12 DIAGNOSIS — J439 Emphysema, unspecified: Secondary | ICD-10-CM | POA: Diagnosis not present

## 2023-04-12 DIAGNOSIS — J449 Chronic obstructive pulmonary disease, unspecified: Secondary | ICD-10-CM | POA: Diagnosis not present

## 2023-04-12 DIAGNOSIS — R7989 Other specified abnormal findings of blood chemistry: Secondary | ICD-10-CM | POA: Diagnosis not present

## 2023-04-12 DIAGNOSIS — I10 Essential (primary) hypertension: Secondary | ICD-10-CM | POA: Diagnosis not present

## 2023-04-12 DIAGNOSIS — I2510 Atherosclerotic heart disease of native coronary artery without angina pectoris: Secondary | ICD-10-CM | POA: Diagnosis not present

## 2023-04-12 DIAGNOSIS — R310 Gross hematuria: Secondary | ICD-10-CM | POA: Diagnosis not present

## 2023-04-12 DIAGNOSIS — J440 Chronic obstructive pulmonary disease with acute lower respiratory infection: Secondary | ICD-10-CM | POA: Diagnosis not present

## 2023-04-12 DIAGNOSIS — I2699 Other pulmonary embolism without acute cor pulmonale: Secondary | ICD-10-CM | POA: Diagnosis not present

## 2023-04-12 DIAGNOSIS — E872 Acidosis, unspecified: Secondary | ICD-10-CM | POA: Diagnosis not present

## 2023-04-12 DIAGNOSIS — C641 Malignant neoplasm of right kidney, except renal pelvis: Secondary | ICD-10-CM | POA: Diagnosis not present

## 2023-04-12 DIAGNOSIS — Z515 Encounter for palliative care: Secondary | ICD-10-CM | POA: Diagnosis not present

## 2023-04-12 DIAGNOSIS — J90 Pleural effusion, not elsewhere classified: Secondary | ICD-10-CM | POA: Diagnosis not present

## 2023-04-12 DIAGNOSIS — Z1152 Encounter for screening for COVID-19: Secondary | ICD-10-CM | POA: Diagnosis not present

## 2023-04-12 DIAGNOSIS — Z7401 Bed confinement status: Secondary | ICD-10-CM | POA: Diagnosis not present

## 2023-04-12 DIAGNOSIS — Y95 Nosocomial condition: Secondary | ICD-10-CM | POA: Diagnosis present

## 2023-04-12 DIAGNOSIS — F05 Delirium due to known physiological condition: Secondary | ICD-10-CM | POA: Diagnosis not present

## 2023-04-12 DIAGNOSIS — Z741 Need for assistance with personal care: Secondary | ICD-10-CM | POA: Diagnosis not present

## 2023-04-12 DIAGNOSIS — E669 Obesity, unspecified: Secondary | ICD-10-CM | POA: Diagnosis not present

## 2023-04-12 DIAGNOSIS — J432 Centrilobular emphysema: Secondary | ICD-10-CM | POA: Diagnosis not present

## 2023-04-12 DIAGNOSIS — J9601 Acute respiratory failure with hypoxia: Secondary | ICD-10-CM | POA: Diagnosis not present

## 2023-04-12 DIAGNOSIS — R062 Wheezing: Secondary | ICD-10-CM | POA: Diagnosis not present

## 2023-04-12 DIAGNOSIS — R0602 Shortness of breath: Secondary | ICD-10-CM | POA: Diagnosis not present

## 2023-04-12 DIAGNOSIS — G9341 Metabolic encephalopathy: Secondary | ICD-10-CM | POA: Diagnosis not present

## 2023-04-12 DIAGNOSIS — I252 Old myocardial infarction: Secondary | ICD-10-CM | POA: Diagnosis not present

## 2023-04-12 DIAGNOSIS — R918 Other nonspecific abnormal finding of lung field: Secondary | ICD-10-CM | POA: Diagnosis not present

## 2023-04-12 DIAGNOSIS — N2889 Other specified disorders of kidney and ureter: Secondary | ICD-10-CM | POA: Diagnosis not present

## 2023-04-12 DIAGNOSIS — Z7189 Other specified counseling: Secondary | ICD-10-CM | POA: Diagnosis not present

## 2023-04-12 DIAGNOSIS — I5032 Chronic diastolic (congestive) heart failure: Secondary | ICD-10-CM | POA: Diagnosis not present

## 2023-04-12 DIAGNOSIS — R531 Weakness: Secondary | ICD-10-CM | POA: Diagnosis not present

## 2023-04-12 DIAGNOSIS — R0689 Other abnormalities of breathing: Secondary | ICD-10-CM | POA: Diagnosis not present

## 2023-04-12 DIAGNOSIS — E876 Hypokalemia: Secondary | ICD-10-CM | POA: Diagnosis not present

## 2023-04-12 DIAGNOSIS — I5021 Acute systolic (congestive) heart failure: Secondary | ICD-10-CM | POA: Diagnosis not present

## 2023-04-12 LAB — COMPREHENSIVE METABOLIC PANEL
ALT: 17 U/L (ref 0–44)
AST: 17 U/L (ref 15–41)
Albumin: 2.3 g/dL — ABNORMAL LOW (ref 3.5–5.0)
Alkaline Phosphatase: 227 U/L — ABNORMAL HIGH (ref 38–126)
Anion gap: 8 (ref 5–15)
BUN: 27 mg/dL — ABNORMAL HIGH (ref 8–23)
CO2: 25 mmol/L (ref 22–32)
Calcium: 11.4 mg/dL — ABNORMAL HIGH (ref 8.9–10.3)
Chloride: 105 mmol/L (ref 98–111)
Creatinine, Ser: 1.5 mg/dL — ABNORMAL HIGH (ref 0.61–1.24)
GFR, Estimated: 48 mL/min — ABNORMAL LOW (ref >60)
Glucose, Bld: 102 mg/dL — ABNORMAL HIGH (ref 70–99)
Potassium: 3.9 mmol/L (ref 3.5–5.1)
Sodium: 138 mmol/L (ref 135–145)
Total Bilirubin: 1 mg/dL (ref 0.3–1.2)
Total Protein: 5.2 g/dL — ABNORMAL LOW (ref 6.5–8.1)

## 2023-04-12 LAB — CBC WITH DIFFERENTIAL/PLATELET
Abs Immature Granulocytes: 2.3 10*3/uL — ABNORMAL HIGH (ref 0.00–0.07)
Basophils Absolute: 0 10*3/uL (ref 0.0–0.1)
Basophils Relative: 0 %
Eosinophils Absolute: 0 10*3/uL (ref 0.0–0.5)
Eosinophils Relative: 0 %
HCT: 36.1 % — ABNORMAL LOW (ref 39.0–52.0)
Hemoglobin: 10.8 g/dL — ABNORMAL LOW (ref 13.0–17.0)
Lymphocytes Relative: 3 %
Lymphs Abs: 2.3 10*3/uL (ref 0.7–4.0)
MCH: 26.2 pg (ref 26.0–34.0)
MCHC: 29.9 g/dL — ABNORMAL LOW (ref 30.0–36.0)
MCV: 87.6 fL (ref 80.0–100.0)
Metamyelocytes Relative: 3 %
Monocytes Absolute: 0 10*3/uL — ABNORMAL LOW (ref 0.1–1.0)
Monocytes Relative: 0 %
Neutro Abs: 72.8 10*3/uL — ABNORMAL HIGH (ref 1.7–7.7)
Neutrophils Relative %: 94 %
Platelets: 263 10*3/uL (ref 150–400)
RBC: 4.12 MIL/uL — ABNORMAL LOW (ref 4.22–5.81)
RDW: 16.6 % — ABNORMAL HIGH (ref 11.5–15.5)
WBC: 77.4 10*3/uL (ref 4.0–10.5)
nRBC: 0.1 % (ref 0.0–0.2)

## 2023-04-12 LAB — MAGNESIUM: Magnesium: 1.9 mg/dL (ref 1.7–2.4)

## 2023-04-12 LAB — SURGICAL PATHOLOGY

## 2023-04-12 MED ORDER — QUETIAPINE FUMARATE 25 MG PO TABS: 25.0000 mg | ORAL_TABLET | Freq: Every day | ORAL | 11 refills | Status: DC

## 2023-04-12 MED ORDER — ZOLEDRONIC ACID 4 MG/100ML IV SOLN
4.0000 mg | Freq: Once | INTRAVENOUS | Status: AC
  Administered 2023-04-12: 4 mg via INTRAVENOUS
  Filled 2023-04-12: qty 100

## 2023-04-12 NOTE — Progress Notes (Addendum)
PROGRESS NOTE   Joshua Gentry  WUJ:[id number]    DOB: 1946-03-12    DOA: 04/05/2023  PCP: Assunta Found, MD   I have briefly reviewed patients previous medical records in [facility].   Brief Narrative:  77 year old married male with PMH of CAD s/p CABG 1997, MI, HLD, HTN, obesity, OSA on CPAP, sarcoma, hospitalized 7/30-8/1 when he presented with gross hematuria, workup revealed right renal mass concerning for metastatic RCC, suspected pulmonary metastasis, seen by urology and planned elective surgery.  He underwent right robotic assisted laparoscopic radical nephrectomy on 8/19.  Postop, TRH was consulted due to hypoxia requiring nonrebreather mask.  He required general anesthesia and was intubated for the procedure.  Acute respiratory failure with hypoxia felt to be due to pneumonia.  Course complicated by acute metabolic encephalopathy.  Overall improved.  Stable for DC from medical standpoint to SNF.  Will need to follow-up outpatient with medical oncology (Dr. Ellin Saba), has appointment for 9/5.  SNF bed search ongoing.  TOC on board.  Updated Dr. Ronne Binning   Assessment & Plan:  Principal Problem:   Right renal mass Active Problems:   Acute hypoxic respiratory failure (HCC)   CAP (community acquired pneumonia)   COPD (chronic obstructive pulmonary disease) (HCC)   Hypercalcemia   Essential hypertension   OSA on CPAP   Right renal mass Presumed metastatic RCC with pulmonary mets. S/p right robotic assisted laparoscopic right nephrectomy 8/19 by Dr. Ronne Binning, urology Supposed to follow-up with medical oncology as outpatient, Dr. Ellin Saba to discuss final diagnosis and treatment options.  Communicated with Dr. Ellin Saba and now has an appointment for follow-up on 9/5 Pathology remains pending  Lobar pneumonia: Could be community-acquired versus aspiration pneumonia from procedure and anesthesia. Chest x-ray 8/19: Confluent density at the left lung base concerning for  LLL pneumonia or atelectasis.  Severe emphysema.  Numerous scattered pulmonary nodules, concentrated at lung bases. Met severe sepsis criteria, not present on admission, developed postop Complete 5 days course of IV ceftriaxone and azithromycin. Continue aggressive pulmonary toilet with incentive spirometry, Also treated with IV Solu-Medrol x 5 days, discontinued Clinically improved Recommend repeating chest x-ray in 4 weeks to ensure resolution of pneumonia findings.  Acute respiratory failure with hypoxia: Postop, oxygen saturations dropped to 89% on 4 L.  At time of initial consultation, required nonrebreather mask to keep oxygen saturations above 92%. -Per family he has had chronic dyspnea, over the past several years that has progressed, but over the past several days/weeks, patient has been increasingly short of breath with minimal activity.  Prior to admission, sats will drop to high 80s with activity.   - Quit smoking cigarettes about 27 years ago. -CT scan/chest x-ray suggesting left lower lobe pneumonia versus atelectasis with chronic emphysematous changes -Acute respiratory failure likely multifactorial related to procedure/anesthesia, possible pneumonia, atelectasis, pleural effusions, COPD, underlying OSA and physical deconditioning. -Completed 5 days course of IV steroids and IV antibiotics -Wean oxygen as tolerated.  Will need home O2 eval prior to discharge.  Noted intermittently on oxygen.  Marked leukocytosis: -Has had significant leukocytosis since 7/30 but up to 8/1 it was in the 20K range. -Peaked to 122K range lately.  Etiology not entirely clear.  Could be related to stress response, steroids and malignancy, but other etiologies need to be considered. -Leukocytosis starting to trend down, down to 77K, suspect due to discontinuation of steroids -Communicated with Dr. Ellin Saba, Oncology on 8/26 and suspects etiologies as above and will follow-up as outpatient.  No  further  recommendations.  Anemia Stable  Hypercalcemia -In the context of presumed metastatic RCC -Serum calciums were normal until 8/1 but since 8/5, increased and over the last several days uncorrected serum calcium stable in the low 11 range -Serum calcium corrected to low albumin (12.8 on 8/24) - TSH (1.337), vitamin D 25-hydroxy: 49.99.  PTH and PTH RP: Pending. -Patient was volume overloaded and despite 2 days of IV Lasix 40 mg daily and good diuresis (almost 6 L urine output), serum calcium is unchanged -Communicated with Dr. Ellin Saba and as per recommendations gave a dose of IV Zometa 4 mg x 1.  He will follow serum calcium levels as outpatient. -Recommend checking CBC and CMP at SNF twice a week pending upcoming outpatient oncology appointment.  Hypokalemia Replaced.  Magnesium 1.9.  Stage IIIb CKD Creatinine has been stable in the 1.5-1.6 range since 8/19 Outpatient follow-up with nephrology.    COPD (chronic obstructive pulmonary disease) (HCC) -No clinical bronchospasm.   OSA on CPAP -Continue CPAP nightly.  Patient continues to refuse his CPAP over the last several days as per his spouse.  Counseled patient that he must use it to minimize risks.   Essential hypertension -Stable blood pressure -Continue current antihypertensive agents  Acute metabolic encephalopathy Likely multifactorial related to hospital delirium, hypoxia, steroids, other meds, sleep cycle reversal, hypercalcemia etc. Delirium precautions Minimize sedatives and opiates Seroquel 25 Mg every afternoon seems to have made the difference.  Per spouse, was slept approximately 5 hours last night, no reported combativeness, agitation, was pleasant and more coherent this morning and improved appetite at breakfast. Continue low-dose Seroquel at DC.  Reevaluate at SNF if this can be changed to as needed and eventually discontinued. Discussed in detail with spouse on 8/25 and advised her that this multifactorial AMS  is not unusual, is treated supportively, course can be unpredictable and could last a couple days to several weeks.  She verbalized understanding. VBG without CO2 retention  Body mass index is 31.19 kg/m./Obesity Noted.   ACP Documents: HCPOA papers appreciated DVT prophylaxis: SCDs Start: 04/05/23 1905     Code Status: Full Code:  Family Communication: Have been updated spouse at bedside daily Disposition:  Status is: Inpatient From a medical standpoint patient is stable for DC to SNF when bed is available.  TOC and attending MD updated     Consultants:   TRH are consultants  Procedures:   As above  Antimicrobials:   As above   Subjective:  Patient denies complaints.  Was taking a nap, easily arousable, oriented x 2.  As per spouse, their son stayed with patient overnight and reported that patient was able to sleep for about 5 hours, no significant combativeness, argumentativeness or agitation.  This morning was pleasant, oriented x 2, laughed when she made a joke, ate better than he had before.  She was pleased with his improvement.  Objective:   Vitals:   04/11/23 2021 04/12/23 0429 04/12/23 0500 04/12/23 0740  BP: 115/62 (!) 141/62    Pulse: 70 76    Resp: 20 20    Temp: 97.7 F (36.5 C) (!) 97.2 F (36.2 C)    TempSrc:      SpO2: 100% 93%  95%  Weight:   110.2 kg   Height:        General exam: Elderly male, moderately built and obese sitting up comfortably in bed. Respiratory system: Clear to auscultation.  No increased work of breathing. Cardiovascular system: S1 & S2 heard,  RRR. No JVD, murmurs, rubs, gallops or clicks.  Trace bilateral ankle edema Gastrointestinal system: Abdomen is nondistended but obese, soft and nontender. No organomegaly or masses felt. Normal bowel sounds heard.  Surgical site dressing clean and dry.  Large area of bruising over RLQ extending to the right lower back. Central nervous system: Alert and oriented x 2. No focal  neurological deficits. Extremities: Symmetric 5 x 5 power. Skin: No rashes, lesions or ulcers Psychiatry: Judgement and insight appear impaired. Mood & affect appropriate.     Data Reviewed:   I have personally reviewed following labs and imaging studies   CBC: Recent Labs  Lab 04/08/23 1422 04/09/23 0423 04/11/23 0607 04/12/23 0454  WBC 122.6* 122.5* 101.2* 77.4*  NEUTROABS 115.2* 118.8*  --  72.8*  HGB 9.3* 9.6* 11.4* 10.8*  HCT 29.9* 31.7* 37.0* 36.1*  MCV 86.9 89.3 86.9 87.6  PLT 315 340 323 263    Basic Metabolic Panel: Recent Labs  Lab 04/07/23 0448 04/08/23 0418 04/10/23 0208 04/11/23 0607 04/12/23 0454  NA 136 135 138 136 138  K 4.5 4.4 3.7 3.2* 3.9  CL 104 105 107 103 105  CO2 25 22 24 25 25   GLUCOSE 113* 101* 92 95 102*  BUN 26* 30* 32* 29* 27*  CREATININE 1.53* 1.61* 1.51* 1.46* 1.50*  CALCIUM 11.5* 11.2* 11.4* 11.7* 11.4*  MG  --   --   --   --  1.9    Liver Function Tests: Recent Labs  Lab 04/05/23 1627 04/10/23 0208 04/12/23 0454  AST 31  --  17  ALT 28  --  17  ALKPHOS 193*  --  227*  BILITOT 0.8  --  1.0  PROT 6.0*  --  5.2*  ALBUMIN 2.5* 2.2* 2.3*    CBG: No results for input(s): "GLUCAP" in the last 168 hours.  Microbiology Studies:   Recent Results (from the past 240 hour(s))  SARS Coronavirus 2 by RT PCR (hospital order, performed in [facility] hospital lab) *cepheid single result test* Anterior Nasal Swab     Status: None   Collection Time: 04/05/23  8:02 PM   Specimen: Anterior Nasal Swab  Result Value Ref Range Status   SARS Coronavirus 2 by RT PCR NEGATIVE NEGATIVE Final    Comment: (NOTE) SARS-CoV-2 target nucleic acids are NOT DETECTED.  The SARS-CoV-2 RNA is generally detectable in upper and lower respiratory specimens during the acute phase of infection. The lowest concentration of SARS-CoV-2 viral copies this assay can detect is 250 copies / mL. A negative result does not preclude SARS-CoV-2 infection and  should not be used as the sole basis for treatment or other patient management decisions.  A negative result may occur with improper specimen collection / handling, submission of specimen other than nasopharyngeal swab, presence of viral mutation(s) within the areas targeted by this assay, and inadequate number of viral copies (<250 copies / mL). A negative result must be combined with clinical observations, patient history, and epidemiological information.  Fact Sheet for Patients:   RoadLapTop.co.za  Fact Sheet for Healthcare Providers: http://kim-miller.com/  This test is not yet approved or  cleared by the [place] FDA and has been authorized for detection and/or diagnosis of SARS-CoV-2 by FDA under an Emergency Use Authorization (EUA).  This EUA will remain in effect (meaning this test can be used) for the duration of the COVID-19 declaration under Section 564(b)(1) of the Act, 21 U.S.C. section 360bbb-3(b)(1), unless the authorization is terminated or revoked  sooner.  Performed at [facility], [address] [place], [place] [address]     Radiology Studies:  No results found.  Scheduled Meds:    budesonide (PULMICORT) nebulizer solution  0.5 mg Nebulization BID   Chlorhexidine Gluconate Cloth  6 each Topical Daily   metoprolol succinate  100 mg Oral Daily   QUEtiapine  25 mg Oral QPM   rosuvastatin  20 mg Oral Daily   senna-docusate  2 tablet Oral QHS    Continuous Infusions:       LOS: 7 days     Marcellus Scott, MD,  FACP, FHM, SFHM, [facility], [facility]   [facility] [place]     To contact the attending provider between 7A-7P or the covering provider during after hours 7P-7A, please log into the web site [email or web address] and access using universal [place] password for that web site. If you do not have the password, please call the hospital operator.  04/12/2023, 2:09 PM

## 2023-04-12 NOTE — TOC Progression Note (Signed)
Transition of Care [facility]) - Progression Note    Patient Details  Name: Joshua Gentry MRN: [id number] Date of Birth: 1946/04/22  Transition of Care [facility]) CM/SW Contact  Myria Steenbergen A , RN Phone Number: 04/12/2023, 10:45 AM  Clinical Narrative:    TOC updated pt's wife of no bed offers. Wife agreeable to referral being sent out to additional facilities. TOC will follow up with wife on bed offers.   Expected Discharge Plan: Home w Home Health Services Barriers to Discharge: Continued Medical Work up  Expected Discharge Plan and Services In-house Referral: Clinical Social Work   Post Acute Care Choice: Home Health Living arrangements for the past 2 months: Single Family Home                 DME Arranged:  (n/a) DME Agency: [employer] Date DME Agency Contacted: 04/08/23 Time DME Agency Contacted: [phone] Representative spoke with at DME Agency: n/a HH Arranged: PT HH Agency: [facility] Date [facility] Agency Contacted: 04/08/23 Time HH Agency Contacted: [phone] Representative spoke with at [facility] Agency: Kandee Keen   Social Determinants of Health (SDOH) Interventions SDOH Screenings   Food Insecurity: No Food Insecurity (03/22/2023)  Housing: Low Risk  (03/22/2023)  Transportation Needs: No Transportation Needs (03/22/2023)  Utilities: Not At Risk (03/22/2023)  Depression (PHQ2-9): Low Risk  (03/22/2023)  Financial Resource Strain: Low Risk  (03/22/2023)  Tobacco Use: Medium Risk (04/05/2023)    Readmission Risk Interventions     No data to display

## 2023-04-12 NOTE — Plan of Care (Signed)
  Problem: Education: Goal: Knowledge of General Education information will improve Description: Including pain rating scale, medication(s)/side effects and non-pharmacologic comfort measures Outcome: Adequate for Discharge   Problem: Health Behavior/Discharge Planning: Goal: Ability to manage health-related needs will improve Outcome: Adequate for Discharge   Problem: Clinical Measurements: Goal: Ability to maintain clinical measurements within normal limits will improve Outcome: Adequate for Discharge Goal: Will remain free from infection Outcome: Adequate for Discharge Goal: Diagnostic test results will improve Outcome: Adequate for Discharge Goal: Respiratory complications will improve Outcome: Adequate for Discharge Goal: Cardiovascular complication will be avoided Outcome: Adequate for Discharge   Problem: Activity: Goal: Risk for activity intolerance will decrease Outcome: Adequate for Discharge   Problem: Nutrition: Goal: Adequate nutrition will be maintained Outcome: Adequate for Discharge   Problem: Coping: Goal: Level of anxiety will decrease Outcome: Adequate for Discharge   Problem: Elimination: Goal: Will not experience complications related to bowel motility Outcome: Adequate for Discharge Goal: Will not experience complications related to urinary retention Outcome: Adequate for Discharge   Problem: Pain Managment: Goal: General experience of comfort will improve Outcome: Adequate for Discharge   Problem: Safety: Goal: Ability to remain free from injury will improve Outcome: Adequate for Discharge   Problem: Skin Integrity: Goal: Risk for impaired skin integrity will decrease Outcome: Adequate for Discharge   Problem: Safety: Goal: Non-violent Restraint(s) Outcome: Adequate for Discharge   Problem: Acute Rehab PT Goals(only PT should resolve) Goal: Pt Will Go Supine/Side To Sit Outcome: Adequate for Discharge Goal: Patient Will Transfer Sit  To/From Stand Outcome: Adequate for Discharge Goal: Pt Will Transfer Bed To Chair/Chair To Bed Outcome: Adequate for Discharge Goal: Pt Will Ambulate Outcome: Adequate for Discharge

## 2023-04-12 NOTE — Care Management Important Message (Signed)
Important Message  Patient Details  Name: Joshua Gentry MRN: [id number] Date of Birth: [birth year]   Medicare Important Message Given:  Yes     Corey Harold 04/12/2023, 10:23 AM

## 2023-04-12 NOTE — Discharge Summary (Addendum)
Physician Discharge Summary  Patient ID: Joshua Gentry MRN: [id number] DOB/AGE: [birth year] 75 y.o.  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 s/p CABG 1997.   Heart attack (HCC)    Hyperlipidemia    Hypertension    Obesity (BMI 30-39.9)    OSA on CPAP 10/16/2014   Compliant   RBBB with left anterior fascicular block    Renal insufficiency     Surgeries: Procedure(s): XI ROBOTIC ASSISTED LAPAROSCOPIC NEPHRECTOMY on 04/05/2023   Consultants (if any): Treatment Team:  Joshua Ade, MD Joshua Etienne, MD  Discharged Condition: Improved  Hospital Course: Joshua Gentry is an 77 y.o. male who was admitted 04/05/2023 with a diagnosis of Right renal mass and went to the operating room on 04/05/2023 and underwent the above named procedures.    He was given perioperative antibiotics:  Anti-infectives (From admission, onward)    Start     Dose/Rate Route Frequency Ordered Stop   04/05/23 2200  ceFAZolin (ANCEF) IVPB 2g/100 mL premix  Status:  Discontinued        2 g 200 mL/hr over 30 Minutes Intravenous Every 8 hours 04/05/23 1715 04/05/23 1949   04/05/23 2000  cefTRIAXone (ROCEPHIN) 2 g in sodium chloride 0.9 % 100 mL IVPB  Status:  Discontinued        2 g 200 mL/hr over 30 Minutes Intravenous Every 24 hours 04/05/23 1916 04/10/23 1712   04/05/23 2000  azithromycin (ZITHROMAX) 500 mg in sodium chloride 0.9 % 250 mL IVPB  Status:  Discontinued        500 mg 250 mL/hr over 60 Minutes Intravenous Every 24 hours 04/05/23 1916 04/10/23 1712    04/05/23 1716  ceFAZolin (ANCEF) 2-4 GM/100ML-% IVPB       Note to Pharmacy: Joshua Gentry H: cabinet override      04/05/23 1716 04/06/23 0727   04/05/23 0955  ceFAZolin (ANCEF) 2-4 GM/100ML-% IVPB       Note to Pharmacy: Laureen Ochs R: cabinet override      04/05/23 0955 04/05/23 1359   04/05/23 0947  ceFAZolin (ANCEF) IVPB 2g/100 mL premix        2 g 200 mL/hr over 30 Minutes Intravenous 30 min pre-op 04/05/23 0947 04/05/23 1257     .  He was given sequential compression devices, early ambulation for DVT prophylaxis.  He benefited maximally from the hospital stay and there were no complications.    Recent vital signs:  Vitals:   04/12/23 0740 04/12/23 1429  BP:  (!) 116/59  Pulse:  71  Resp:  (!) 24  Temp:  97.9 F (36.6 C)  SpO2: 95% 91%    Recent laboratory studies:  Lab Results  Component Value Date   HGB 10.8 (L) 04/12/2023   HGB 11.4 (L) 04/11/2023   HGB 9.6 (L) 04/09/2023   Lab Results  Component Value Date   WBC 77.4 (HH) 04/12/2023   PLT 263 04/12/2023   No results found for: "INR" Lab Results  Component Value Date   NA  138 04/12/2023   K 3.9 04/12/2023   CL 105 04/12/2023   CO2 25 04/12/2023   BUN 27 (H) 04/12/2023   CREATININE 1.50 (H) 04/12/2023   GLUCOSE 102 (H) 04/12/2023    Discharge Medications:   Allergies as of 04/12/2023       Reactions   Niacin    Penicillins    Patient can't recall reaction- happened a long time ago        Medication List     TAKE these medications    calcium-vitamin D 250-100 MG-UNIT tablet Take 1 tablet by mouth 2 (two) times daily.   metoprolol succinate 100 MG 24 hr tablet Commonly known as: Toprol XL Take 1 tablet (100 mg total) by mouth daily. Take with or immediately following a meal.   multivitamin with minerals tablet Take 1 tablet by mouth daily.   nitroGLYCERIN 0.4 MG SL tablet Commonly known as: NITROSTAT DISSOLVE 1 TABLET UNDER THE TONGUE EVERY 5 MINUTES AS NEEDED FOR CHEST PAIN, DO  NOT EXCEED A TOTAL OF 3 DOSES IN 15 MINUTES   QUEtiapine 25 MG tablet Commonly known as: SEROQUEL Take 1 tablet (25 mg total) by mouth at bedtime.   rosuvastatin 20 MG tablet Commonly known as: CRESTOR TAKE 1 TABLET(20 MG) BY MOUTH DAILY What changed: See the new instructions.        Diagnostic Studies: NM Bone Scan Whole Body  Result Date: 04/08/2023 CLINICAL DATA:  RCC staging. EXAM: NUCLEAR MEDICINE WHOLE BODY BONE SCAN TECHNIQUE: Whole body anterior and posterior images were obtained approximately 3 hours after intravenous injection of radiopharmaceutical. RADIOPHARMACEUTICALS:  21.5 mCi Technetium-93m MDP IV COMPARISON:  Correlation with CT chest, abdomen and pelvis from 03/16/2023 FINDINGS: There is activity identified in the right maxilla consistent with periodontal uptake. There is some uptake by the thyroid of free pertechnetate. There is bilateral shoulder, sternomanubrial, wrists activity which are probably degenerative findings. Multilevel low-level uptake in the thoracic spine consistent with the degenerative changes observed on CT. There is bilateral renal uptake with a photopenic defect on the right likely corresponding to the presence of a mass. No discrete focal uptake is identified to correspond to the scattered sclerotic lesions observed on CT. IMPRESSION: 1. Multifocal joint activity is probably degenerative. 2. Low-level uptake in the thoracic spine also corresponding to degenerative changes. 3. No discrete focal uptake identified to correspond to sclerotic lesions observed on CT that would indicate the presence of osseous metastatic disease. 4. Photopenic defect in the right kidney mid pole consistent with the presence of a mass. Electronically Signed   By: Layla Maw M.D.   On: 04/08/2023 15:02   DG CHEST PORT 1 VIEW  Result Date: 04/05/2023 CLINICAL DATA:  Hypoxia EXAM: PORTABLE CHEST 1 VIEW COMPARISON:  03/15/2023 FINDINGS: Numerous scattered pulmonary nodules more  concentrated at the lung bases. Severe emphysema.  Prior CABG. Confluent density at the left lung base is increased from prior and raises the possibility of left lower lobe pneumonia or atelectasis. Heart size within normal limits. New blunting of the bilateral costophrenic angles, left greater than right, suggesting small pleural effusions. Free intraperitoneal gas is present below the diaphragms. The patient had a robot assisted right laparoscopic radical nephrectomy this after tan, which readily explains the pneumoperitoneum as an expected finding. IMPRESSION: 1. New small bilateral pleural effusions, left greater than right. 2. Confluent density at the left lung base is increased from prior and raises the possibility of left lower lobe pneumonia or atelectasis. 3. Numerous scattered  pulmonary nodules more concentrated at the lung bases. 4. Severe emphysema. 5. Free intraperitoneal gas below the diaphragms, expected finding given this afternoon's laparoscopic surgery. Electronically Signed   By: Gaylyn Rong M.D.   On: 04/05/2023 18:53   CT ABDOMEN PELVIS W CONTRAST  Result Date: 03/16/2023 CLINICAL DATA:  Abdominal pain, acute, nonlocalized EXAM: CT ABDOMEN AND PELVIS WITH CONTRAST TECHNIQUE: Multidetector CT imaging of the abdomen and pelvis was performed using the standard protocol following bolus administration of intravenous contrast. RADIATION DOSE REDUCTION: This exam was performed according to the departmental dose-optimization program which includes automated exposure control, adjustment of the mA and/or kV according to patient size and/or use of iterative reconstruction technique. CONTRAST:  80mL OMNIPAQUE IOHEXOL 300 MG/ML  SOLN COMPARISON:  None Available. FINDINGS: Lower chest: There are innumerable nodules in the visualized bilateral lungs, compatible with metastasis. Please refer to separately dictated CT scan chest performed earlier the same day. The heart is normal in size. No  pericardial effusion. Hepatobiliary: The liver is normal in size. Non-cirrhotic configuration. There are multiple hypoattenuating structures in the liver with largest in the left hepatic dome measuring up to 3.2 x 4.3 cm, which can be characterized as a simple cyst. There are multiple subcentimeter hypoattenuating foci throughout liver, which are too small to adequately characterize but favored to represent cysts as well. They can be better characterized with contrast-enhanced MRI abdomen, if clinically indicated. No intrahepatic or extrahepatic bile duct dilation. Moderate volume dependent gallstones noted. Normal gallbladder wall thickness. No pericholecystic inflammatory changes. Pancreas: Unremarkable. No pancreatic ductal dilatation or surrounding inflammatory changes. Spleen: Within normal limits. There is a 1.4 x 1.5 cm simple cyst arising from the upper pole. Adrenals/Urinary Tract: Adrenal glands are unremarkable. There is a heterogeneous approximately 4.1 by 6.9 x 4.5 cm (anteroposterior x transverse x craniocaudal) contour deforming lesion centered in the right kidney upper pole with protrusion into the sinus fat. This is highly concerning for renal neoplasm. There are few adjacent retrocaval lymph nodes noted with short axis less than 6 mm. Renal vein is patent. No perinephric fat stranding. Bilateral kidneys are otherwise unremarkable. No hydroureteronephrosis or nephroureterolithiasis. Unremarkable urinary bladder. Stomach/Bowel: There is a small sliding hiatal hernia. No disproportionate dilation of the small or large bowel loops. No evidence of abnormal bowel wall thickening or inflammatory changes. The appendix is unremarkable. Vascular/Lymphatic: No ascites or pneumoperitoneum. No abdominal or pelvic lymphadenopathy, by size criteria. No aneurysmal dilation of the major abdominal arteries. There are moderate peripheral atherosclerotic vascular calcifications of the aorta and its major branches.  Reproductive: Enlarged prostate. Symmetric seminal vesicles. Other: There is a tiny fat containing umbilical hernia. The soft tissues and abdominal wall are otherwise unremarkable. Musculoskeletal: There are several subcentimeter sized sclerotic foci in the T11 vertebral body, L2 vertebral body and L1 spinous process, which are indeterminate. No pathological fracture. There are mild multilevel degenerative changes in the visualized spine. IMPRESSION: 1. There is a 6.9 cm heterogeneous lesion centered in the right kidney upper pole with protrusion into the sinus fat. This is highly concerning for renal neoplasm. There are few adjacent retrocaval lymph nodes noted with short axis less than 6 mm. Renal vein is patent. 2. Innumerable nodules in the visualized bilateral lungs, compatible with metastasis. Please refer to same-day performed CT scan chest report for details. 3. Several subcentimeter sized sclerotic foci in the T11 vertebral body, L2 vertebral body and L1 spinous process, which are indeterminate. No pathological fracture. 4. Multiple other nonacute observations, as described  above. Aortic Atherosclerosis (ICD10-I70.0). Electronically Signed   By: Jules Schick M.D.   On: 03/16/2023 14:22   CT Chest Wo Contrast  Result Date: 03/16/2023 CLINICAL DATA:  Lung nodules. EXAM: CT CHEST WITHOUT CONTRAST TECHNIQUE: Multidetector CT imaging of the chest was performed following the standard protocol without IV contrast. RADIATION DOSE REDUCTION: This exam was performed according to the departmental dose-optimization program which includes automated exposure control, adjustment of the mA and/or kV according to patient size and/or use of iterative reconstruction technique. COMPARISON:  X-ray 03/15/2023 FINDINGS: Cardiovascular: Status post median sternotomy. Heart is nonenlarged. No pericardial effusion. On this non IV contrast exam thoracic aorta has a normal course and caliber. Mediastinum/Nodes: Heterogeneous  thyroid gland. Slightly patulous thoracic esophagus with a small hiatal hernia. No specific abnormal lymph node enlargement identified in the axillary regions, hilum or mediastinum. Lungs/Pleura: Advanced centrilobular emphysematous lung changes greatest in the upper lung zones. There is some left apical pleural blebs as well. Areas of scarring and fibrotic changes. There is a calcified nodule seen in the lingula on image 113 of series 4. However there are numerous noncalcified lung nodules identified. The vast majority are cm in size or smaller. There are a few larger areas as well such as right lower lobe on series 4, image 115 measuring 20 by 16 mm. Left lower lobe focus on image 125 measuring 16 by 13 mm. Please correlate for any known history such as malignancy. Otherwise recommend further evaluation. No pleural effusion or pneumothorax. Upper Abdomen: Adrenal glands are preserved in the upper abdomen. Multiple stones in the nondilated gallbladder. Possible mass lesion involving the right kidney. Recommend abdominal imaging with contrast. Musculoskeletal: Advanced degenerative changes of the spine. IMPRESSION: Numerous lung nodules identified worrisome for metastatic disease. Possible mass lesion involving the right kidney. Recommend follow up contrast CT scan of the abdomen and pelvis to further delineate. Gallstones. Aortic Atherosclerosis (ICD10-I70.0) and Emphysema (ICD10-J43.9). Electronically Signed   By: Karen Kays M.D.   On: 03/16/2023 11:41   DG Chest 2 View  Result Date: 03/15/2023 CLINICAL DATA:  Chronic cough. EXAM: CHEST - 2 VIEW COMPARISON:  11/02/2017 FINDINGS: Interval development of patchy and nodular subpleural and airspace disease in both lung bases. Stable left lower lobe pulmonary nodule compatible with calcified granuloma. No substantial pleural effusion. Lungs are hyperexpanded. Cardiopericardial silhouette is at upper limits of normal for size. No acute bony abnormality.  IMPRESSION: Interval development of basilar predominant patchy and nodular subpleural and airspace disease in both lung bases. Imaging features could be related to multifocal pneumonia but given nodular character in some regions, chest CT recommended to further evaluate. Electronically Signed   By: Kennith Center M.D.   On: 03/15/2023 10:32    Disposition: Discharge disposition: 03-Skilled Nursing Facility       Discharge Instructions     Discharge patient   Complete by: As directed    Discharge disposition: 03-Skilled Nursing Facility   Discharge patient date: 04/12/2023        Contact information for after-discharge care     Destination     [facility] .   Service: Skilled Nursing Contact information: [address] [place] [address] [phone]                      Signed: Wilkie Aye 04/12/2023, 3:59 PM

## 2023-04-12 NOTE — TOC Transition Note (Signed)
Transition of Care [facility]) - CM/SW Discharge Note   Patient Details  Name: Joshua Gentry MRN: [id number] Date of Birth: 11-30-1945  [facility]) CM/SW Contact:  Jarell Mcewen A , RN Phone Number: 04/12/2023, 3:28 PM   Clinical Narrative:    Pt wife accepted SNF at [facility],  Portal, of acceptance and bed info provided. Notified attending physician and discharge orders were placed. RN updated with room and report numbers. RN states pt will need EMS. EMS notified and placed on list. Medical Necessity note completed and printed to nursing floor. TOC signing off.    Final next level of care: Skilled Nursing Facility Barriers to Discharge: No Barriers Identified   Patient Goals and CMS Choice CMS Medicare.gov Compare Post Acute Care list provided to:: Other (Comment Required) (Taken) Choice offered to / list presented to : Spouse  Discharge Placement                  Patient to be transferred to facility by: EMS Name of family member notified: wife Patient and family notified of of transfer: 04/12/23  Discharge Plan and Services Additional resources added to the After Visit Summary for   In-house Referral: Clinical Social Work   Post Acute Care Choice: Home Health          DME Arranged:  (n/a) DME Agency: [employer] Date DME Agency Contacted: 04/08/23 Time DME Agency Contacted: [id number] Representative spoke with at DME Agency: n/a HH Arranged: PT HH Agency: [facility] Date [facility] Agency Contacted: 04/08/23 Time HH Agency Contacted: [phone] Representative spoke with at [facility] Agency: Kandee Keen  Social Determinants of Health (SDOH) Interventions SDOH Screenings   Food Insecurity: No Food Insecurity (03/22/2023)  Housing: Low Risk  (03/22/2023)  Transportation Needs: No Transportation Needs (03/22/2023)  Utilities: Not At Risk (03/22/2023)  Depression (PHQ2-9): Low Risk  (03/22/2023)  Financial Resource Strain: Low Risk  (03/22/2023)  Tobacco Use:  Medium Risk (04/05/2023)     Readmission Risk Interventions     No data to display

## 2023-04-12 NOTE — Progress Notes (Signed)
Date and time results received: 04/12/23 0624   Test: WBC Critical Value: 77.4  Name of Provider Notified: [place]o  Orders Received? Or Actions Taken?:  Overnight coverage informed.  [place] RN

## 2023-04-13 ENCOUNTER — Emergency Department (HOSPITAL_COMMUNITY): Payer: Medicare Other

## 2023-04-13 ENCOUNTER — Inpatient Hospital Stay (HOSPITAL_COMMUNITY)
Admission: EM | Admit: 2023-04-13 | Discharge: 2023-05-18 | DRG: 193 | Disposition: E | Payer: Medicare Other | Source: Skilled Nursing Facility | Attending: Internal Medicine | Admitting: Internal Medicine

## 2023-04-13 ENCOUNTER — Other Ambulatory Visit: Payer: Self-pay

## 2023-04-13 ENCOUNTER — Encounter (HOSPITAL_COMMUNITY): Payer: Self-pay | Admitting: *Deleted

## 2023-04-13 DIAGNOSIS — R0902 Hypoxemia: Secondary | ICD-10-CM | POA: Diagnosis not present

## 2023-04-13 DIAGNOSIS — Z515 Encounter for palliative care: Secondary | ICD-10-CM | POA: Diagnosis not present

## 2023-04-13 DIAGNOSIS — R911 Solitary pulmonary nodule: Secondary | ICD-10-CM | POA: Diagnosis not present

## 2023-04-13 DIAGNOSIS — C7802 Secondary malignant neoplasm of left lung: Secondary | ICD-10-CM | POA: Diagnosis not present

## 2023-04-13 DIAGNOSIS — E876 Hypokalemia: Secondary | ICD-10-CM | POA: Diagnosis not present

## 2023-04-13 DIAGNOSIS — E785 Hyperlipidemia, unspecified: Secondary | ICD-10-CM | POA: Diagnosis not present

## 2023-04-13 DIAGNOSIS — Z66 Do not resuscitate: Secondary | ICD-10-CM | POA: Diagnosis not present

## 2023-04-13 DIAGNOSIS — Z0389 Encounter for observation for other suspected diseases and conditions ruled out: Secondary | ICD-10-CM | POA: Diagnosis not present

## 2023-04-13 DIAGNOSIS — R310 Gross hematuria: Secondary | ICD-10-CM | POA: Diagnosis not present

## 2023-04-13 DIAGNOSIS — Z7189 Other specified counseling: Secondary | ICD-10-CM | POA: Diagnosis not present

## 2023-04-13 DIAGNOSIS — I5032 Chronic diastolic (congestive) heart failure: Secondary | ICD-10-CM | POA: Diagnosis not present

## 2023-04-13 DIAGNOSIS — I252 Old myocardial infarction: Secondary | ICD-10-CM | POA: Diagnosis not present

## 2023-04-13 DIAGNOSIS — J440 Chronic obstructive pulmonary disease with acute lower respiratory infection: Secondary | ICD-10-CM | POA: Diagnosis present

## 2023-04-13 DIAGNOSIS — Z86711 Personal history of pulmonary embolism: Secondary | ICD-10-CM

## 2023-04-13 DIAGNOSIS — I4891 Unspecified atrial fibrillation: Secondary | ICD-10-CM | POA: Diagnosis not present

## 2023-04-13 DIAGNOSIS — I10 Essential (primary) hypertension: Secondary | ICD-10-CM | POA: Diagnosis not present

## 2023-04-13 DIAGNOSIS — R062 Wheezing: Secondary | ICD-10-CM | POA: Diagnosis not present

## 2023-04-13 DIAGNOSIS — Z880 Allergy status to penicillin: Secondary | ICD-10-CM

## 2023-04-13 DIAGNOSIS — I455 Other specified heart block: Secondary | ICD-10-CM

## 2023-04-13 DIAGNOSIS — C7801 Secondary malignant neoplasm of right lung: Secondary | ICD-10-CM | POA: Diagnosis not present

## 2023-04-13 DIAGNOSIS — Z91038 Other insect allergy status: Secondary | ICD-10-CM

## 2023-04-13 DIAGNOSIS — Z833 Family history of diabetes mellitus: Secondary | ICD-10-CM

## 2023-04-13 DIAGNOSIS — N1832 Chronic kidney disease, stage 3b: Secondary | ICD-10-CM | POA: Diagnosis not present

## 2023-04-13 DIAGNOSIS — R0602 Shortness of breath: Secondary | ICD-10-CM | POA: Diagnosis not present

## 2023-04-13 DIAGNOSIS — I2699 Other pulmonary embolism without acute cor pulmonale: Secondary | ICD-10-CM | POA: Diagnosis present

## 2023-04-13 DIAGNOSIS — J439 Emphysema, unspecified: Secondary | ICD-10-CM | POA: Diagnosis present

## 2023-04-13 DIAGNOSIS — R2689 Other abnormalities of gait and mobility: Secondary | ICD-10-CM | POA: Diagnosis not present

## 2023-04-13 DIAGNOSIS — D631 Anemia in chronic kidney disease: Secondary | ICD-10-CM | POA: Diagnosis not present

## 2023-04-13 DIAGNOSIS — Z905 Acquired absence of kidney: Secondary | ICD-10-CM

## 2023-04-13 DIAGNOSIS — G4733 Obstructive sleep apnea (adult) (pediatric): Secondary | ICD-10-CM

## 2023-04-13 DIAGNOSIS — I130 Hypertensive heart and chronic kidney disease with heart failure and stage 1 through stage 4 chronic kidney disease, or unspecified chronic kidney disease: Secondary | ICD-10-CM | POA: Diagnosis not present

## 2023-04-13 DIAGNOSIS — J189 Pneumonia, unspecified organism: Secondary | ICD-10-CM | POA: Diagnosis not present

## 2023-04-13 DIAGNOSIS — M6281 Muscle weakness (generalized): Secondary | ICD-10-CM | POA: Diagnosis not present

## 2023-04-13 DIAGNOSIS — I2510 Atherosclerotic heart disease of native coronary artery without angina pectoris: Secondary | ICD-10-CM | POA: Diagnosis present

## 2023-04-13 DIAGNOSIS — D72823 Leukemoid reaction: Secondary | ICD-10-CM | POA: Diagnosis not present

## 2023-04-13 DIAGNOSIS — R0603 Acute respiratory distress: Principal | ICD-10-CM

## 2023-04-13 DIAGNOSIS — J449 Chronic obstructive pulmonary disease, unspecified: Secondary | ICD-10-CM | POA: Diagnosis present

## 2023-04-13 DIAGNOSIS — Z888 Allergy status to other drugs, medicaments and biological substances status: Secondary | ICD-10-CM

## 2023-04-13 DIAGNOSIS — E872 Acidosis, unspecified: Secondary | ICD-10-CM | POA: Insufficient documentation

## 2023-04-13 DIAGNOSIS — Z6831 Body mass index (BMI) 31.0-31.9, adult: Secondary | ICD-10-CM | POA: Diagnosis not present

## 2023-04-13 DIAGNOSIS — E440 Moderate protein-calorie malnutrition: Secondary | ICD-10-CM | POA: Insufficient documentation

## 2023-04-13 DIAGNOSIS — Y95 Nosocomial condition: Secondary | ICD-10-CM | POA: Diagnosis present

## 2023-04-13 DIAGNOSIS — F05 Delirium due to known physiological condition: Secondary | ICD-10-CM | POA: Diagnosis not present

## 2023-04-13 DIAGNOSIS — J9601 Acute respiratory failure with hypoxia: Secondary | ICD-10-CM | POA: Diagnosis present

## 2023-04-13 DIAGNOSIS — Z1152 Encounter for screening for COVID-19: Secondary | ICD-10-CM | POA: Diagnosis not present

## 2023-04-13 DIAGNOSIS — R918 Other nonspecific abnormal finding of lung field: Secondary | ICD-10-CM | POA: Diagnosis not present

## 2023-04-13 DIAGNOSIS — E669 Obesity, unspecified: Secondary | ICD-10-CM | POA: Diagnosis present

## 2023-04-13 DIAGNOSIS — J90 Pleural effusion, not elsewhere classified: Secondary | ICD-10-CM | POA: Diagnosis not present

## 2023-04-13 DIAGNOSIS — Z951 Presence of aortocoronary bypass graft: Secondary | ICD-10-CM

## 2023-04-13 DIAGNOSIS — C649 Malignant neoplasm of unspecified kidney, except renal pelvis: Secondary | ICD-10-CM | POA: Diagnosis not present

## 2023-04-13 DIAGNOSIS — I493 Ventricular premature depolarization: Secondary | ICD-10-CM | POA: Diagnosis not present

## 2023-04-13 DIAGNOSIS — I480 Paroxysmal atrial fibrillation: Secondary | ICD-10-CM | POA: Diagnosis not present

## 2023-04-13 DIAGNOSIS — I5021 Acute systolic (congestive) heart failure: Secondary | ICD-10-CM | POA: Diagnosis not present

## 2023-04-13 DIAGNOSIS — J432 Centrilobular emphysema: Secondary | ICD-10-CM | POA: Diagnosis not present

## 2023-04-13 DIAGNOSIS — Z7901 Long term (current) use of anticoagulants: Secondary | ICD-10-CM

## 2023-04-13 DIAGNOSIS — C641 Malignant neoplasm of right kidney, except renal pelvis: Secondary | ICD-10-CM | POA: Diagnosis not present

## 2023-04-13 DIAGNOSIS — R7989 Other specified abnormal findings of blood chemistry: Secondary | ICD-10-CM | POA: Diagnosis not present

## 2023-04-13 DIAGNOSIS — I4510 Unspecified right bundle-branch block: Secondary | ICD-10-CM | POA: Diagnosis present

## 2023-04-13 DIAGNOSIS — Z8249 Family history of ischemic heart disease and other diseases of the circulatory system: Secondary | ICD-10-CM

## 2023-04-13 DIAGNOSIS — Z87891 Personal history of nicotine dependence: Secondary | ICD-10-CM

## 2023-04-13 DIAGNOSIS — Z79899 Other long term (current) drug therapy: Secondary | ICD-10-CM

## 2023-04-13 DIAGNOSIS — R069 Unspecified abnormalities of breathing: Secondary | ICD-10-CM | POA: Diagnosis not present

## 2023-04-13 DIAGNOSIS — G9340 Encephalopathy, unspecified: Secondary | ICD-10-CM | POA: Diagnosis not present

## 2023-04-13 DIAGNOSIS — R0689 Other abnormalities of breathing: Secondary | ICD-10-CM | POA: Diagnosis not present

## 2023-04-13 LAB — COMPREHENSIVE METABOLIC PANEL
ALT: 18 U/L (ref 0–44)
AST: 19 U/L (ref 15–41)
Albumin: 2.5 g/dL — ABNORMAL LOW (ref 3.5–5.0)
Alkaline Phosphatase: 290 U/L — ABNORMAL HIGH (ref 38–126)
Anion gap: 11 (ref 5–15)
BUN: 27 mg/dL — ABNORMAL HIGH (ref 8–23)
CO2: 23 mmol/L (ref 22–32)
Calcium: 11.5 mg/dL — ABNORMAL HIGH (ref 8.9–10.3)
Chloride: 103 mmol/L (ref 98–111)
Creatinine, Ser: 1.64 mg/dL — ABNORMAL HIGH (ref 0.61–1.24)
GFR, Estimated: 43 mL/min — ABNORMAL LOW (ref >60)
Glucose, Bld: 130 mg/dL — ABNORMAL HIGH (ref 70–99)
Potassium: 4 mmol/L (ref 3.5–5.1)
Sodium: 137 mmol/L (ref 135–145)
Total Bilirubin: 1.1 mg/dL (ref 0.3–1.2)
Total Protein: 5.8 g/dL — ABNORMAL LOW (ref 6.5–8.1)

## 2023-04-13 LAB — CBC WITH DIFFERENTIAL/PLATELET
Abs Immature Granulocytes: 1.2 10*3/uL — ABNORMAL HIGH (ref 0.00–0.07)
Abs Immature Granulocytes: 1.1 10*3/uL — ABNORMAL HIGH (ref 0.00–0.07)
Band Neutrophils: 11 %
Band Neutrophils: 4 %
Basophils Absolute: 0 10*3/uL (ref 0.0–0.1)
Basophils Absolute: 0 10*3/uL (ref 0.0–0.1)
Basophils Relative: 0 %
Basophils Relative: 0 %
Eosinophils Absolute: 0 10*3/uL (ref 0.0–0.5)
Eosinophils Absolute: 0 10*3/uL (ref 0.0–0.5)
Eosinophils Relative: 0 %
Eosinophils Relative: 0 %
HCT: 29.9 % — ABNORMAL LOW (ref 39.0–52.0)
HCT: 35.8 % — ABNORMAL LOW (ref 39.0–52.0)
Hemoglobin: 9.3 g/dL — ABNORMAL LOW (ref 13.0–17.0)
Hemoglobin: 11 g/dL — ABNORMAL LOW (ref 13.0–17.0)
Lymphocytes Relative: 3 %
Lymphocytes Relative: 6 %
Lymphs Abs: 3.7 10*3/uL (ref 0.7–4.0)
Lymphs Abs: 6.7 10*3/uL — ABNORMAL HIGH (ref 0.7–4.0)
MCH: 27 pg (ref 26.0–34.0)
MCH: 26.8 pg (ref 26.0–34.0)
MCHC: 31.1 g/dL (ref 30.0–36.0)
MCHC: 30.7 g/dL (ref 30.0–36.0)
MCV: 86.9 fL (ref 80.0–100.0)
MCV: 87.3 fL (ref 80.0–100.0)
Metamyelocytes Relative: 1 %
Metamyelocytes Relative: 1 %
Monocytes Absolute: 2.5 10*3/uL — ABNORMAL HIGH (ref 0.1–1.0)
Monocytes Absolute: 1.1 10*3/uL — ABNORMAL HIGH (ref 0.1–1.0)
Monocytes Relative: 2 %
Monocytes Relative: 1 %
Neutro Abs: 115.2 10*3/uL — ABNORMAL HIGH (ref 1.7–7.7)
Neutro Abs: 102.3 10*3/uL — ABNORMAL HIGH (ref 1.7–7.7)
Neutrophils Relative %: 83 %
Neutrophils Relative %: 88 %
Platelets: 315 10*3/uL (ref 150–400)
Platelets: 240 10*3/uL (ref 150–400)
RBC: 3.44 MIL/uL — ABNORMAL LOW (ref 4.22–5.81)
RBC: 4.1 MIL/uL — ABNORMAL LOW (ref 4.22–5.81)
RDW: 16.2 % — ABNORMAL HIGH (ref 11.5–15.5)
RDW: 16.5 % — ABNORMAL HIGH (ref 11.5–15.5)
WBC Morphology: INCREASED
WBC: 122.6 10*3/uL (ref 4.0–10.5)
WBC: 111.2 10*3/uL (ref 4.0–10.5)
nRBC: 0 % (ref 0.0–0.2)
nRBC: 0 % (ref 0.0–0.2)

## 2023-04-13 LAB — RESP PANEL BY RT-PCR (RSV, FLU A&B, COVID)  RVPGX2
Influenza A by PCR: NEGATIVE
Influenza B by PCR: NEGATIVE
Resp Syncytial Virus by PCR: NEGATIVE
SARS Coronavirus 2 by RT PCR: NEGATIVE

## 2023-04-13 LAB — LACTIC ACID, PLASMA
Lactic Acid, Venous: 2.1 mmol/L (ref 0.5–1.9)
Lactic Acid, Venous: 1.8 mmol/L (ref 0.5–1.9)

## 2023-04-13 LAB — PTH, INTACT AND CALCIUM
Calcium, Total (PTH): 11.5 mg/dL — ABNORMAL HIGH (ref 8.6–10.2)
PTH: 8 pg/mL — ABNORMAL LOW (ref 15–65)

## 2023-04-13 LAB — D-DIMER, QUANTITATIVE: D-Dimer, Quant: 1.83 ug{FEU}/mL — ABNORMAL HIGH (ref 0.00–0.50)

## 2023-04-13 LAB — BRAIN NATRIURETIC PEPTIDE: B Natriuretic Peptide: 3483 pg/mL — ABNORMAL HIGH (ref 0.0–100.0)

## 2023-04-13 MED ORDER — SODIUM CHLORIDE 0.9 % IV SOLN: 2.0000 g | Freq: Two times a day (BID) | INTRAVENOUS | Status: DC

## 2023-04-13 MED ORDER — QUETIAPINE FUMARATE 25 MG PO TABS
25.0000 mg | ORAL_TABLET | Freq: Every day | ORAL | Status: DC
  Administered 2023-04-13 – 2023-04-15 (×3): 25 mg via ORAL
  Filled 2023-04-13 (×3): qty 1

## 2023-04-13 MED ORDER — ENOXAPARIN SODIUM 120 MG/0.8ML IJ SOSY: 1.0000 mg/kg | PREFILLED_SYRINGE | Freq: Once | INTRAMUSCULAR | Status: DC

## 2023-04-13 MED ORDER — SODIUM CHLORIDE 0.9 % IV SOLN
2.0000 g | Freq: Once | INTRAVENOUS | Status: AC
  Administered 2023-04-13: 2 g via INTRAVENOUS
  Filled 2023-04-13: qty 12.5

## 2023-04-13 MED ORDER — ONDANSETRON HCL 4 MG PO TABS: 4.0000 mg | ORAL_TABLET | Freq: Four times a day (QID) | ORAL | Status: DC | PRN

## 2023-04-13 MED ORDER — ACETAMINOPHEN 325 MG PO TABS
650.0000 mg | ORAL_TABLET | Freq: Four times a day (QID) | ORAL | Status: DC | PRN
  Administered 2023-04-17: 650 mg via ORAL
  Filled 2023-04-13: qty 2

## 2023-04-13 MED ORDER — VANCOMYCIN HCL 1750 MG/350ML IV SOLN
1750.0000 mg | Freq: Once | INTRAVENOUS | Status: AC
  Administered 2023-04-13: 1750 mg via INTRAVENOUS
  Filled 2023-04-13: qty 350

## 2023-04-13 MED ORDER — SODIUM CHLORIDE 0.9 % IV SOLN
2.0000 g | Freq: Two times a day (BID) | INTRAVENOUS | Status: DC
  Administered 2023-04-14 – 2023-04-18 (×10): 2 g via INTRAVENOUS
  Filled 2023-04-13 (×10): qty 12.5

## 2023-04-13 MED ORDER — DM-GUAIFENESIN ER 30-600 MG PO TB12
1.0000 | ORAL_TABLET | Freq: Two times a day (BID) | ORAL | Status: DC
  Administered 2023-04-14 – 2023-04-18 (×9): 1 via ORAL
  Filled 2023-04-13 (×9): qty 1

## 2023-04-13 MED ORDER — ACETAMINOPHEN 650 MG RE SUPP: 650.0000 mg | Freq: Four times a day (QID) | RECTAL | Status: DC | PRN

## 2023-04-13 MED ORDER — HEPARIN (PORCINE) 25000 UT/250ML-% IV SOLN
1950.0000 [IU]/h | INTRAVENOUS | Status: DC
  Administered 2023-04-13: 1650 [IU]/h via INTRAVENOUS
  Filled 2023-04-13 (×2): qty 250

## 2023-04-13 MED ORDER — VANCOMYCIN HCL 1750 MG/350ML IV SOLN: 1750.0000 mg | INTRAVENOUS | Status: DC

## 2023-04-13 MED ORDER — ONDANSETRON HCL 4 MG/2ML IJ SOLN: 4.0000 mg | Freq: Four times a day (QID) | INTRAMUSCULAR | Status: DC | PRN

## 2023-04-13 MED ORDER — FUROSEMIDE 10 MG/ML IJ SOLN
20.0000 mg | Freq: Once | INTRAMUSCULAR | Status: AC
  Administered 2023-04-13: 20 mg via INTRAVENOUS
  Filled 2023-04-13: qty 2

## 2023-04-13 NOTE — Progress Notes (Signed)
Pharmacy Antibiotic Note  Joshua Gentry is a 77 y.o. male admitted on 04/13/2023 with PNA. Pt was recently hospitalized and underwent nephrectomy on 04/05/23, presumed metastatic RCC. Pt was treated for PNA with ceftriaxone and azithromycin. Pt returned to ED after being discharged to SNF with hypoxia  Pharmacy has been consulted for vancomycin dosing.  Today, 04/13/23 WBC significantly elevated, but consistent with labs last admission  Plan: Vancomycin 1750 mg IV q36h for estimated AUC of 505 (goal 400-550). Check levels as needed MRSA PCR ordered Follow renal function, culture data.   Height: 6\' 2"  (188 cm) Weight: 110.2 kg (242 lb 15.2 oz) IBW/kg (Calculated) : 82.2  No data recorded.  Recent Labs  Lab 04/08/23 0418 04/08/23 1422 04/09/23 0423 04/10/23 0208 04/11/23 0607 04/12/23 0454 04/13/23 1643  WBC 122.8* 122.6* 122.5*  --  101.2* 77.4* 111.2*  CREATININE 1.61*  --   --  1.51* 1.46* 1.50* 1.64*  LATICACIDVEN  --   --   --   --   --   --  2.1*    Estimated Creatinine Clearance: 51.4 mL/min (A) (by C-G formula based on SCr of 1.64 mg/dL (H)).    Allergies  Allergen Reactions   Niacin    Penicillins     Patient can't recall reaction- happened a long time ago    Antimicrobials this admission: Vancomycin 8/27 >>  Dose adjustments this admission:  Microbiology results: 8/27 MRSA PCR:    Cindi Carbon, PharmD 04/13/2023 5:36 PM

## 2023-04-13 NOTE — ED Provider Notes (Signed)
[place] EMERGENCY DEPARTMENT AT [facility] Provider Note   CSN: [id number] Arrival date & time: 04/13/23  1623     History  Chief Complaint  Patient presents with   Shortness of Breath    Joshua Gentry is a 77 y.o. male.  HPI Patient presents via EMS from rehab.  Level 5 given secondary to acute condition. Initially the patient is alone, but his wife eventually joins him.  I have seen and evaluated the patient, diagnosed his renal mass 1 month ago.  He has subsequently had nephrectomy, was discharged yesterday to his rehab facility.  Wife notes the patient has had waxing, waning recovery has been on oxygen.  Today the patient had increasing oxygen dependency, with increasing delirium and was sent here for evaluation..  Today the patient had increasing oxygen dependency, with increasing delirium and was sent here for evaluation. En route patient received DuoNeb, Solu-Medrol, magnesium.       Home Medications Prior to Admission medications   Medication Sig Start Date End Date Taking? Authorizing Provider  calcium-vitamin D 250-100 MG-UNIT per tablet Take 1 tablet by mouth 2 (two) times daily.    [provider]  metoprolol succinate (TOPROL XL) 100 MG 24 hr tablet Take 1 tablet (100 mg total) by mouth daily. Take with or immediately following a meal. 01/22/23   Strader, [place] M, PA-C  Multiple Vitamins-Minerals (MULTIVITAMIN WITH MINERALS) tablet Take 1 tablet by mouth daily.    [provider]  nitroGLYCERIN (NITROSTAT) 0.4 MG SL tablet DISSOLVE 1 TABLET UNDER THE TONGUE EVERY 5 MINUTES AS NEEDED FOR CHEST PAIN, DO NOT EXCEED A TOTAL OF 3 DOSES IN 15 MINUTES 11/06/22   Wendall Stade, MD  QUEtiapine (SEROQUEL) 25 MG tablet Take 1 tablet (25 mg total) by mouth at bedtime. 04/12/23   McKenzie, Mardene Celeste, MD  rosuvastatin (CRESTOR) 20 MG tablet TAKE 1 TABLET(20 MG) BY MOUTH DAILY Patient taking differently: Take 20 mg by mouth daily. 12/24/22   Antoine Poche, MD      Allergies    Niacin and Penicillins    Review of Systems   Review of Systems  Unable to perform ROS: Acuity of condition    Physical Exam Updated Vital Signs BP 120/63   Pulse 92   Resp (!) 28   Ht 6\' 2"  (1.88 m)   Wt 110.2 kg   SpO2 94%   BMI 31.19 kg/m  Physical Exam Vitals and nursing note reviewed.  Constitutional:      General: He is in acute distress.     Appearance: He is well-developed. He is ill-appearing and diaphoretic.  HENT:     Head: Normocephalic and atraumatic.  Eyes:     Conjunctiva/sclera: Conjunctivae normal.  Cardiovascular:     Rate and Rhythm: Regular rhythm. Tachycardia present.  Pulmonary:     Effort: Tachypnea, accessory muscle usage and respiratory distress present.     Breath sounds: No stridor. Decreased breath sounds present.  Abdominal:     General: There is no distension.     Comments: Right lower abdomen with sequelae of surgery, clean, dry, intact, multiple incisional wounds noted. No abdominal pain.  Skin:    General: Skin is warm.  Neurological:     Mental Status: He is alert and oriented to person, place, and time.     ED Results / Procedures / Treatments   Labs (all labs ordered are listed, but only abnormal results are displayed) Labs Reviewed  COMPREHENSIVE  METABOLIC PANEL - Abnormal; Notable for the following components:      Result Value   Glucose, Bld 130 (*)    BUN 27 (*)    Creatinine, Ser 1.64 (*)    Calcium 11.5 (*)    Total Protein 5.8 (*)    Albumin 2.5 (*)    Alkaline Phosphatase 290 (*)    GFR, Estimated 43 (*)    All other components within normal limits  BRAIN NATRIURETIC PEPTIDE - Abnormal; Notable for the following components:   B Natriuretic Peptide 3,483.0 (*)    All other components within normal limits  CBC WITH DIFFERENTIAL/PLATELET - Abnormal; Notable for the following components:   WBC 111.2 (*)    RBC 4.10 (*)    Hemoglobin 11.0 (*)    HCT 35.8 (*)    RDW 16.5 (*)     Neutro Abs 102.3 (*)    Lymphs Abs 6.7 (*)    Monocytes Absolute 1.1 (*)    Abs Immature Granulocytes 1.10 (*)    All other components within normal limits  LACTIC ACID, PLASMA - Abnormal; Notable for the following components:   Lactic Acid, Venous 2.1 (*)    All other components within normal limits  RESP PANEL BY RT-PCR (RSV, FLU A&B, COVID)  RVPGX2  MRSA NEXT GEN BY PCR, NASAL  LACTIC ACID, PLASMA    EKG EKG Interpretation Date/Time:  Tuesday April 13 2023 16:50:32 EDT Ventricular Rate:  106 PR Interval:  176 QRS Duration:  132 QT Interval:  357 QTC Calculation: 475 R Axis:   270  Text Interpretation: Sinus tachycardia with irregular rate Right bundle branch block Inferior infarct, old ST-t wave abnormality Confirmed by Gerhard Munch [phone]) on 04/13/2023 4:52:37 PM  Radiology DG Chest Port 1 View  Result Date: 04/13/2023 CLINICAL DATA:  Short of breath, respiratory distress EXAM: PORTABLE CHEST 1 VIEW COMPARISON:  04/05/2023 FINDINGS: 2 frontal views of the chest demonstrate a stable cardiac silhouette. Postsurgical changes from prior CABG. Background emphysema is again noted. Numerous bilateral pulmonary nodules are seen, most pronounced at the lung bases, concerning for metastatic disease based on prior CT findings. There are persistent areas of bibasilar consolidation, right greater than left, slightly increased since prior study. Trace right pleural effusion. No pneumothorax. No acute bony abnormalities. IMPRESSION: 1. Bilateral pulmonary nodules again noted, consistent with suspected metastatic disease. Please see prior CT evaluation 03/16/2023. 2. Increasing bibasilar consolidation, with new small right pleural effusion, which could reflect edema or infection. 3. Emphysema. Electronically Signed   By: Sharlet Salina M.D.   On: 04/13/2023 17:36    Procedures Procedures    Medications Ordered in ED Medications  vancomycin (VANCOREADY) IVPB 1750 mg/350 mL (1,750 mg  Intravenous New Bag/Given 04/13/23 1823)  vancomycin (VANCOREADY) IVPB 1750 mg/350 mL (has no administration in time range)  ceFEPIme (MAXIPIME) 2 g in sodium chloride 0.9 % 100 mL IVPB (0 g Intravenous Stopped 04/13/23 1823)  furosemide (LASIX) injection 20 mg (20 mg Intravenous Given 04/13/23 1859)    ED Course/ Medical Decision Making/ A&P                                 Medical Decision Making Elderly male with recent nephrectomy presents with respiratory distress, delirium.  Broad differential including metastatic progression versus pneumonia versus heart failure, with consideration bacteremia, sepsis.  Patient initially had nonrebreather mask, was titrated during the initial period in the emergency department.  Initial labs notable for leukocytosis greater than 110,000.  Malignancy history noted for renal cell, no mention of blood line malignancy previously suspicion for pneumonia given his abnormal x-ray, patient was started on broad-spectrum antibiotics.  PE is a consideration, but given the patient's recent substantial surgery not a candidate for anticoagulation.  Amount and/or Complexity of Data Reviewed Labs: ordered. Radiology: ordered.  Risk Prescription drug management.   Update: Patient's wife at bedside, I discussed his presentation, progress since our last evaluation 1 month ago.  7:05 PM I discussed patient's case with his oncologist, Dr. Kirtland Bouchard.  Together we discussed findings thus far, including concern for recurrent pneumonia, likely complicated by the patient's pulmonary metastatic disease.  Additional considerations including PE were discussed, as well as the patient's elevated BNP.  For tonight patient will have IV diuretics, is this will have only anticipated mild renal implication.  Depending on the patient's condition tomorrow he may be considered for additional advanced imaging including CT angiography acknowledging that with 1 remaining kidney this study may have some  adverse consequences. Patient admitted to the hospitalist team for respiratory distress likely secondary to multifactorial etiology including healthcare acquired pneumonia, heart failure, pulmonary metastatic disease.         Final Clinical Impression(s) / ED Diagnoses Final diagnoses:  Respiratory distress  HCAP (healthcare-associated pneumonia)  CRITICAL CARE Performed by: Gerhard Munch Total critical care time: 45 minutes Critical care time was exclusive of separately billable procedures and treating other patients. Critical care was necessary to treat or prevent imminent or life-threatening deterioration. Critical care was time spent personally by me on the following activities: development of treatment plan with patient and/or surrogate as well as nursing, discussions with consultants, evaluation of patient's response to treatment, examination of patient, obtaining history from patient or surrogate, ordering and performing treatments and interventions, ordering and review of laboratory studies, ordering and review of radiographic studies, pulse oximetry and re-evaluation of patient's condition.    Gerhard Munch, MD 04/13/23 [phone]

## 2023-04-13 NOTE — H&P (Signed)
History and Physical    Patient: Joshua Gentry:[id number] DOB: 20-May-1946 DOA: 04/13/2023 DOS: the patient was seen and examined on 04/13/2023 PCP: Assunta Found, MD  Patient coming from: SNF  Chief Complaint:  Chief Complaint  Patient presents with   Shortness of Breath   HPI: Joshua Gentry is a 77 y.o. male with medical history significant of hypertension, hyperlipidemia, OSA on CPAP, CAD s/p CABG (97), obesity, sarcoma who presents to the emergency department to the emergency department from SNF due to increased work of breathing while on oxygen.  He was noted to have increased dependency on oxygen at the nursing facility today, so EMS was activated and patient was sent to the ED for further evaluation and management.  DuoNeb, Solu-Medrol, magnesium was given en route. He was recently hospitalized from 730-8/1 due to having gross hematuria which was later on noted to be due to right renal mass that was concerning for RCC with suspicion for pulmonary metastasis.  Urology was consulted at that time and elective surgery was planned.  On 8/19, patient underwent right robotic assisted laparoscopic radical nephrectomy.  He was intubated for the procedure under general anesthesia.  Patient developed hypoxia postop and required NRB, TRH was consulted and patient was noted to have acute respiratory failure with hypoxia due to pneumonia.  He also developed acute metabolic encephalopathy.  Patient was discharged to SNF yesterday (9/26)  ED Course:  In the emergency department, he was tachypneic, tachycardic and O2 sat was 91 to 98% on supplemental oxygen via [place] at 6 LPM.  Workup in the ED showed WBC of 111.2 (this was 122.6 earlier today), hemoglobin 11.0, hematocrit 35.8, MCV 87.3, platelets 240.  BMP was normal except for blood glucose of 130, BUN/creatinine 27/1.64 (baseline creatinine at 1.4-1.6), albumin 2.5.  Influenza A, B, SARS coronavirus 2, RSV was negative. Chest x-ray showed bilateral  pulmonary nodules consistent with suspected metastatic disease.  Increasing bibasilar consolidation with new small right pleural effusion which could reflect edema or infection.  Emphysema. Dr. Ellin Saba (oncologist) was consulted and there was concern for recurrent pneumonia, PE and elevated BNP, Patient was started on IV vancomycin and cefepime, IV Lasix 20 mg x 1 was given.  IV heparin drip due to suspicion for possible PE was started with plan for VQ scan in the morning. Hospitalist was asked to admit patient for further evaluation and management.   Review of Systems: Review of systems as noted in the HPI. All other systems reviewed and are negative.   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 s/p CABG 1997.   Heart attack (HCC)    Hyperlipidemia    Hypertension    Obesity (BMI 30-39.9)    OSA on CPAP 10/16/2014   Compliant   RBBB with left anterior fascicular block    Renal insufficiency    Past Surgical History:  Procedure Laterality Date   COLONOSCOPY N/A 08/01/2013   Procedure: COLONOSCOPY;  Surgeon: Dalia Heading, MD;  Location: [facility];  Service: Gastroenterology;  Laterality: N/A;   CORONARY ARTERY BYPASS GRAFT     x4   Lens Bilateral April 2016   Replacment    LOWER LEG SOFT TISSUE TUMOR EXCISION     ROBOT ASSISTED LAPAROSCOPIC NEPHRECTOMY Right 04/05/2023   Procedure: XI ROBOTIC ASSISTED LAPAROSCOPIC NEPHRECTOMY;  Surgeon: Malen Gauze, MD;  Location: [facility] ORS;  Service: Urology;  Laterality: Right;    Social History:  reports that he quit smoking about 26 years ago. His smoking use included cigarettes. He started smoking about 58 years ago. He has a 48 pack-year smoking history. He has never used smokeless tobacco. He reports that he does not currently use alcohol. He reports that he does not use drugs.   Allergies  Allergen Reactions   Fire Ant Anaphylaxis   Niacin     Penicillins     Patient can't recall reaction- happened a long time ago   Tape Hives   Wound Dressing Adhesive Rash    Family History  Problem Relation Age of Onset   Hypertension Mother    Diabetes Mother    Heart attack Father    Diabetes Brother      Prior to Admission medications   Medication Sig Start Date End Date Taking? Authorizing Provider  calcium-vitamin D 250-100 MG-UNIT per tablet Take 1 tablet by mouth 2 (two) times daily.   Yes [provider]  metoprolol succinate (TOPROL XL) 100 MG 24 hr tablet Take 1 tablet (100 mg total) by mouth daily. Take with or immediately following a meal. 01/22/23  Yes Strader, [place] M, PA-C  Multiple Vitamins-Minerals (MULTIVITAMIN WITH MINERALS) tablet Take 1 tablet by mouth daily.   Yes [provider]  nitroGLYCERIN (NITROSTAT) 0.4 MG SL tablet DISSOLVE 1 TABLET UNDER THE TONGUE EVERY 5 MINUTES AS NEEDED FOR CHEST PAIN, DO NOT EXCEED A TOTAL OF 3 DOSES IN 15 MINUTES 11/06/22  Yes Wendall Stade, MD  QUEtiapine (SEROQUEL) 25 MG tablet Take 1 tablet (25 mg total) by mouth at bedtime. 04/12/23  Yes McKenzie, Mardene Celeste, MD  rosuvastatin (CRESTOR) 20 MG tablet TAKE 1 TABLET(20 MG) BY MOUTH DAILY Patient taking differently: Take 20 mg by mouth daily. 12/24/22  Yes BranchDorothe Pea, MD  spironolactone (ALDACTONE) 25 MG tablet Take 25 mg by mouth daily. 03/25/23  Yes [provider]    Physical Exam: BP 117/66   Pulse 83   Resp (!) 29   Ht 6\' 2"  (1.88 m)   Wt 110.2 kg   SpO2 97%   BMI 31.19 kg/m   General: 77 y.o. year-old male ill appearing, but in no acute distress.  Alert and oriented x3. HEENT: NCAT, EOMI Neck: Supple, trachea medial Cardiovascular: Regular rate and rhythm with no rubs or gallops.  No thyromegaly or JVD noted.  No lower extremity edema. 2/4 pulses in all 4 extremities. Respiratory: Tachypnea.  Clear to auscultation with no wheezes or rales. Good inspiratory effort. Abdomen: Soft, nontender  nondistended with normal bowel sounds x4 quadrants. Muskuloskeletal: No cyanosis, clubbing or edema noted bilaterally Neuro: CN II-XII intact, strength 5/5 x 4, sensation, reflexes intact Skin: No ulcerative lesions noted or rashes Psychiatry: Judgement and insight appear normal. Mood is appropriate for condition and setting          Labs on Admission:  Basic Metabolic Panel: Recent Labs  Lab 04/08/23 0418 04/10/23 0208 04/10/23 1733 04/11/23 0607 04/12/23 0454 04/13/23 1643  NA 135 138  --  136 138 137  K 4.4 3.7  --  3.2* 3.9 4.0  CL 105 107  --  103 105 103  CO2 22 24  --  25 25 23   GLUCOSE 101* 92  --  95 102* 130*  BUN 30* 32*  --  29* 27* 27*  CREATININE 1.61* 1.51*  --  1.46* 1.50* 1.64*  CALCIUM 11.2* 11.4* 11.5* 11.7* 11.4* 11.5*  MG  --   --   --   --  1.9  --    Liver Function Tests: Recent Labs  Lab 04/10/23 0208 04/12/23 0454 04/13/23 1643  AST  --  17 19  ALT  --  17 18  ALKPHOS  --  227* 290*  BILITOT  --  1.0 1.1  PROT  --  5.2* 5.8*  ALBUMIN 2.2* 2.3* 2.5*   No results for input(s): "LIPASE", "AMYLASE" in the last 168 hours. No results for input(s): "AMMONIA" in the last 168 hours. CBC: Recent Labs  Lab 04/08/23 1422 04/09/23 0423 04/11/23 0607 04/12/23 0454 04/13/23 1643  WBC 122.6* 122.5* 101.2* 77.4* 111.2*  NEUTROABS 115.2* 118.8*  --  72.8* 102.3*  HGB 9.3* 9.6* 11.4* 10.8* 11.0*  HCT 29.9* 31.7* 37.0* 36.1* 35.8*  MCV 86.9 89.3 86.9 87.6 87.3  PLT 315 340 323 263 240   Cardiac Enzymes: No results for input(s): "CKTOTAL", "CKMB", "CKMBINDEX", "TROPONINI" in the last 168 hours.  BNP (last 3 results) Recent Labs    03/16/23 1124 04/13/23 1643  BNP 60.0 3,483.0*    ProBNP (last 3 results) No results for input(s): "PROBNP" in the last 8760 hours.  CBG: No results for input(s): "GLUCAP" in the last 168 hours.  Radiological Exams on Admission: DG Chest Port 1 View  Result Date: 04/13/2023 CLINICAL DATA:  Short of breath,  respiratory distress EXAM: PORTABLE CHEST 1 VIEW COMPARISON:  04/05/2023 FINDINGS: 2 frontal views of the chest demonstrate a stable cardiac silhouette. Postsurgical changes from prior CABG. Background emphysema is again noted. Numerous bilateral pulmonary nodules are seen, most pronounced at the lung bases, concerning for metastatic disease based on prior CT findings. There are persistent areas of bibasilar consolidation, right greater than left, slightly increased since prior study. Trace right pleural effusion. No pneumothorax. No acute bony abnormalities. IMPRESSION: 1. Bilateral pulmonary nodules again noted, consistent with suspected metastatic disease. Please see prior CT evaluation 03/16/2023. 2. Increasing bibasilar consolidation, with new small right pleural effusion, which could reflect edema or infection. 3. Emphysema. Electronically Signed   By: Sharlet Salina M.D.   On: 04/13/2023 17:36    EKG: I independently viewed the EKG done and my findings are as followed: Sinus tachycardia at rate of 106 bpm with RBBB  Assessment/Plan Present on Admission:  Acute respiratory failure with hypoxia (HCC)  Recurrent pneumonia  Hypercalcemia  COPD (chronic obstructive pulmonary disease) (HCC)  Essential hypertension  Obesity (BMI 30-39.9)  Principal Problem:   Acute respiratory failure with hypoxia (HCC) Active Problems:   Recurrent pneumonia   COPD (chronic obstructive pulmonary disease) (HCC)   Hypercalcemia   Essential hypertension   OSA on CPAP   Obesity (BMI 30-39.9)   Elevated brain natriuretic peptide (BNP) level   Chronic kidney disease, stage 3b (HCC)   Lactic acidosis  Acute respiratory failure with hypoxia secondary to multifactorial This is possibly due to recurrent pneumonia, pleural effusion, pulmonary edema, COPD, underlying OSA and physical deconditioning.  Continue supplemental oxygen to maintain O2 sats > 92%  Recurrent pneumonia Patient was started on vancomycin and  cefepime, we shall continue same at this time with plan to de-escalate/discontinue based on blood culture, sputum culture, urine Legionella, strep pneumo and procalcitonin Continue Tylenol as needed Continue Mucinex, incentive spirometry, flutter valve   Elevated BNP rule out CHF BNP 3,483 Chest x-ray showed new small right pleural effusion which could reflect edema or infection Continue total input/output, daily weights and fluid restriction IV Lasix 20  mg x 1 was given.  Continue IV Lasix 20 mg twice daily Continue Cardiac diet  Echocardiogram in the morning   Possible pulmonary embolism D-dimer will be checked Patient was started on IV heparin drip in the ED  Hypercalcemia possibly due to metastatic RCC Corrected calcium to low albumin-12.7 (this was 2.8 on 8/24) IV hydration will be held at this time due to elevated BNP with suspicion for CHF Dr. Ellin Saba was consulted prior to discharge and recommended IV Zometa 4 mg x 1.  He has already been consulted by EDP and we shall await further recommendations  Lactic acidosis -resolved Lactic acid 2.1 > 1.8  Stage IIIb CKD Creatinine at 1.64.  This has been stable in the 1.5-1.6 range since 8/19 Renally adjust medications, avoid nephrotoxic agents/dehydration/hypotension   COPD (chronic obstructive pulmonary disease)  Stable, no clinical bronchospasm.   OSA on CPAP Continue CPAP nightly.    Essential hypertension Stable blood pressure Continue home meds  Obesity (BMI 31.19) Diet and lifestyle modification   DVT prophylaxis: Heparin drip   Advance Care Planning: Full code  Consults: Oncology  Family Communication: None at bedside  Severity of Illness: The appropriate patient status for this patient is INPATIENT. Inpatient status is judged to be reasonable and necessary in order to provide the required intensity of service to ensure the patient's safety. The patient's presenting symptoms, physical exam findings, and  initial radiographic and laboratory data in the context of their chronic comorbidities is felt to place them at high risk for further clinical deterioration. Furthermore, it is not anticipated that the patient will be medically stable for discharge from the hospital within 2 midnights of admission.   * I certify that at the point of admission it is my clinical judgment that the patient will require inpatient hospital care spanning beyond 2 midnights from the point of admission due to high intensity of service, high risk for further deterioration and high frequency of surveillance required.*  Author: Frankey Shown, DO 04/13/2023 10:18 PM  For on call review [email or web address].

## 2023-04-13 NOTE — Progress Notes (Signed)
ANTICOAGULATION CONSULT NOTE - Initial Consult  Pharmacy Consult for heparin Indication: Rule out PE  Allergies  Allergen Reactions   Fire Ant Anaphylaxis   Niacin    Penicillins     Patient can't recall reaction- happened a long time ago   Tape Hives   Wound Dressing Adhesive Rash    Patient Measurements: Height: 6\' 2"  (188 cm) Weight: 110.2 kg (242 lb 15.2 oz) IBW/kg (Calculated) : 82.2 Heparin Dosing Weight: 105 kg  Vital Signs: BP: 112/57 (08/27 1900) Pulse Rate: 85 (08/27 1900)  Labs: Recent Labs    04/11/23 0607 04/12/23 0454 04/13/23 1643  HGB 11.4* 10.8* 11.0*  HCT 37.0* 36.1* 35.8*  PLT 323 263 240  CREATININE 1.46* 1.50* 1.64*    Estimated Creatinine Clearance: 51.4 mL/min (A) (by C-G formula based on SCr of 1.64 mg/dL (H)).   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 s/p CABG 1997.   Heart attack (HCC)    Hyperlipidemia    Hypertension    Obesity (BMI 30-39.9)    OSA on CPAP 10/16/2014   Compliant   RBBB with left anterior fascicular block    Renal insufficiency     Medications: Not on anticoagulants PTA  Assessment: Pt is a 50 yoM who was recently hospitalized late July with hematuria and subsequently found to have a renal mass. He is followed by oncology for presumed metastatic renal cancer. Pt underwent a right nephrectomy on 04/05/23 and remained in the hospital until discharged on 04/12/23. Pt was on SCDs post-op and also received antibiotics for PNA.   Pt presented back in ED from SNF on 8/27 with SOB, hypoxia. Pharmacy consulted to dose heparin drip for possible PE. No heparin bolus since post-op per discussion with MD.   Today, 04/13/23 CBC: Hgb slightly low but stable, Plt WNL SCr stable compared to recent labs  Goal of Therapy:  Heparin level 0.3-0.7 units/ml Monitor platelets by anticoagulation protocol: Yes   Plan:  No  bolus Initiate heparin infusion at 1650 units/hr Check 8 hour heparin level CBC, heparin level daily Monitor for signs of bleeding  Cindi Carbon, PharmD 04/13/2023,8:16 PM

## 2023-04-13 NOTE — Progress Notes (Signed)
Pharmacy Antibiotic Note  Joshua Gentry is a 77 y.o. male admitted on 04/13/2023 with PNA. Pt was recently hospitalized and underwent nephrectomy on 04/05/23, presumed metastatic RCC. Pt was treated for PNA with ceftriaxone and azithromycin. Pt returned to ED after being discharged to SNF with hypoxia. Pharmacy has been consulted for vancomycin and cefepime dosing.  Plan: Cefepime 2g q12h Continue Vancomycin 1750 mg IV q36h for estimated AUC of 505 (goal 400-550). Check levels as needed MRSA PCR ordered Follow renal function, culture data, and narrow as able  Height: 6\' 2"  (188 cm) Weight: 110.2 kg (242 lb 15.2 oz) IBW/kg (Calculated) : 82.2  No data recorded.  Recent Labs  Lab 04/08/23 0418 04/08/23 1422 04/09/23 0423 04/10/23 0208 04/11/23 0607 04/12/23 0454 04/13/23 1643 04/13/23 1833  WBC 122.8* 122.6* 122.5*  --  101.2* 77.4* 111.2*  --   CREATININE 1.61*  --   --  1.51* 1.46* 1.50* 1.64*  --   LATICACIDVEN  --   --   --   --   --   --  2.1* 1.8    Estimated Creatinine Clearance: 51.4 mL/min (A) (by C-G formula based on SCr of 1.64 mg/dL (H)).    Allergies  Allergen Reactions   Fire Ant Anaphylaxis   Niacin    Penicillins     Patient can't recall reaction- happened a long time ago   Tape Hives   Wound Dressing Adhesive Rash    Antimicrobials this admission: Vancomycin 8/27 > Cefepime 8/27 >  Microbiology results: 8/27 MRSA PCR:  8/27 Bcx: 8/27 resp panel: negative   Marja Kays, PharmD 04/13/2023 10:14 PM

## 2023-04-13 NOTE — ED Triage Notes (Signed)
Pt BIB [facility] from [facility] for c/o sob; staff reported to ems pt's O2 sats were in the low to mid 80's, they placed pt on NRB and when ems arrived O2 sats were in mid 90's  Pt was given duoned, solumedrol 125mg  and magnesium 2GM  Pt is c/o mid back pain  Pt had right kidney removed on 8/19

## 2023-04-14 ENCOUNTER — Other Ambulatory Visit (HOSPITAL_COMMUNITY): Payer: Self-pay | Admitting: *Deleted

## 2023-04-14 ENCOUNTER — Inpatient Hospital Stay (HOSPITAL_COMMUNITY): Payer: Medicare Other

## 2023-04-14 DIAGNOSIS — I5021 Acute systolic (congestive) heart failure: Secondary | ICD-10-CM | POA: Diagnosis not present

## 2023-04-14 DIAGNOSIS — J9601 Acute respiratory failure with hypoxia: Secondary | ICD-10-CM | POA: Diagnosis not present

## 2023-04-14 LAB — COMPREHENSIVE METABOLIC PANEL
ALT: 16 U/L (ref 0–44)
AST: 19 U/L (ref 15–41)
Albumin: 2.4 g/dL — ABNORMAL LOW (ref 3.5–5.0)
Alkaline Phosphatase: 280 U/L — ABNORMAL HIGH (ref 38–126)
Anion gap: 8 (ref 5–15)
BUN: 28 mg/dL — ABNORMAL HIGH (ref 8–23)
CO2: 24 mmol/L (ref 22–32)
Calcium: 10.7 mg/dL — ABNORMAL HIGH (ref 8.9–10.3)
Chloride: 106 mmol/L (ref 98–111)
Creatinine, Ser: 1.83 mg/dL — ABNORMAL HIGH (ref 0.61–1.24)
GFR, Estimated: 38 mL/min — ABNORMAL LOW (ref >60)
Glucose, Bld: 145 mg/dL — ABNORMAL HIGH (ref 70–99)
Potassium: 3.8 mmol/L (ref 3.5–5.1)
Sodium: 138 mmol/L (ref 135–145)
Total Bilirubin: 1.1 mg/dL (ref 0.3–1.2)
Total Protein: 5.5 g/dL — ABNORMAL LOW (ref 6.5–8.1)

## 2023-04-14 LAB — TECHNOLOGIST SMEAR REVIEW

## 2023-04-14 LAB — PROCALCITONIN: Procalcitonin: 1.34 ng/mL

## 2023-04-14 LAB — CBC WITH DIFFERENTIAL/PLATELET
Abs Immature Granulocytes: 7.97 10*3/uL — ABNORMAL HIGH (ref 0.00–0.07)
Band Neutrophils: 1 %
Basophils Absolute: 0 10*3/uL (ref 0.0–0.1)
Basophils Relative: 0 %
Eosinophils Absolute: 0 10*3/uL (ref 0.0–0.5)
Eosinophils Relative: 1 %
HCT: 33.2 % — ABNORMAL LOW (ref 39.0–52.0)
Hemoglobin: 10 g/dL — ABNORMAL LOW (ref 13.0–17.0)
Lymphocytes Relative: 4 %
Lymphs Abs: 2.6 10*3/uL (ref 0.7–4.0)
MCH: 26.5 pg (ref 26.0–34.0)
MCHC: 30.1 g/dL (ref 30.0–36.0)
MCV: 87.8 fL (ref 80.0–100.0)
Monocytes Absolute: 2.2 10*3/uL — ABNORMAL HIGH (ref 0.1–1.0)
Monocytes Relative: 4 %
Neutro Abs: 118.9 10*3/uL — ABNORMAL HIGH (ref 1.7–7.7)
Neutrophils Relative %: 90 %
Platelets: 206 10*3/uL (ref 150–400)
RBC: 3.78 MIL/uL — ABNORMAL LOW (ref 4.22–5.81)
RDW: 16.6 % — ABNORMAL HIGH (ref 11.5–15.5)
WBC: 131.8 10*3/uL (ref 4.0–10.5)
nRBC: 0 % (ref 0.0–0.2)
nRBC: 0 /100 WBC

## 2023-04-14 LAB — ECHOCARDIOGRAM COMPLETE
AR max vel: 2.81 cm2
AV Area VTI: 2.71 cm2
AV Area mean vel: 2.78 cm2
AV Mean grad: 6 mmHg
AV Peak grad: 12.5 mmHg
Ao pk vel: 1.77 m/s
Area-P 1/2: 1.46 cm2
Height: 74 in
S' Lateral: 2.8 cm
Weight: 3887.15 [oz_av]

## 2023-04-14 LAB — HEPARIN LEVEL (UNFRACTIONATED): Heparin Unfractionated: 0.18 [IU]/mL — ABNORMAL LOW (ref 0.30–0.70)

## 2023-04-14 LAB — PHOSPHORUS: Phosphorus: 2.6 mg/dL (ref 2.5–4.6)

## 2023-04-14 LAB — MAGNESIUM: Magnesium: 2.3 mg/dL (ref 1.7–2.4)

## 2023-04-14 MED ORDER — PERFLUTREN LIPID MICROSPHERE
1.0000 mL | INTRAVENOUS | Status: AC | PRN
  Administered 2023-04-14: 3 mL via INTRAVENOUS

## 2023-04-14 MED ORDER — MIDODRINE HCL 5 MG PO TABS: 10.0000 mg | ORAL_TABLET | Freq: Three times a day (TID) | ORAL | Status: DC

## 2023-04-14 MED ORDER — ENOXAPARIN SODIUM 120 MG/0.8ML IJ SOSY
110.0000 mg | PREFILLED_SYRINGE | Freq: Two times a day (BID) | INTRAMUSCULAR | Status: DC
  Administered 2023-04-14 – 2023-04-16 (×4): 110 mg via SUBCUTANEOUS
  Filled 2023-04-14 (×4): qty 0.8

## 2023-04-14 MED ORDER — METOPROLOL SUCCINATE ER 50 MG PO TB24: 100.0000 mg | ORAL_TABLET | Freq: Every day | ORAL | Status: DC

## 2023-04-14 MED ORDER — CHLORHEXIDINE GLUCONATE CLOTH 2 % EX PADS
6.0000 | MEDICATED_PAD | Freq: Every day | CUTANEOUS | Status: DC
  Administered 2023-04-14 – 2023-04-18 (×5): 6 via TOPICAL

## 2023-04-14 MED ORDER — VANCOMYCIN HCL IN DEXTROSE 1-5 GM/200ML-% IV SOLN
1000.0000 mg | INTRAVENOUS | Status: DC
  Administered 2023-04-14: 1000 mg via INTRAVENOUS
  Filled 2023-04-14: qty 200

## 2023-04-14 MED ORDER — MIDODRINE HCL 5 MG PO TABS
10.0000 mg | ORAL_TABLET | Freq: Three times a day (TID) | ORAL | Status: AC
  Administered 2023-04-14 – 2023-04-15 (×3): 10 mg via ORAL
  Filled 2023-04-14 (×3): qty 2

## 2023-04-14 NOTE — Progress Notes (Addendum)
ANTICOAGULATION CONSULT NOTE - Follow Up Consult  Pharmacy Consult for Heparin Indication:  rule out PE  Allergies  Allergen Reactions   Fire Ant Anaphylaxis   Niacin    Penicillins     Patient can't recall reaction- happened a long time ago   Tape Hives   Wound Dressing Adhesive Rash    Patient Measurements: Height: 6\' 2"  (188 cm) Weight: 110.2 kg (242 lb 15.2 oz) IBW/kg (Calculated) : 82.2 Heparin Dosing Weight: 105 kg  Vital Signs: Temp: 97.5 F (36.4 C) (08/28 0724) Temp Source: Oral (08/28 0724) BP: 117/57 (08/28 0724) Pulse Rate: 85 (08/28 0724)  Labs: Recent Labs    04/12/23 0454 04/13/23 1643 04/14/23 0552  HGB 10.8* 11.0* 10.2*  HCT 36.1* 35.8* 33.3*  PLT 263 240 210  HEPARINUNFRC  --   --  0.18*  CREATININE 1.50* 1.64* 1.83*    Estimated Creatinine Clearance: 46.1 mL/min (A) (by C-G formula based on SCr of 1.83 mg/dL (H)).   Medications:  (Not in a hospital admission)   Assessment: Patient started heparin infusion at 1650 units/hr without bolus on 8/27 for rule out PE. Heparin level 0.18 - subtherapeutic. Hemoglobin lowers to 10.2. Platelets WNL.   Goal of Therapy:  Heparin level 0.3-0.7 units/ml Monitor platelets by anticoagulation protocol: Yes   Plan:  Increase heparin infusion to 1950 units/hr. Check heparin level in 8 hours. Monitor for signs of bleeding.  Tory Emerald, PharmD Candidate 04/14/2023,7:47 AM

## 2023-04-14 NOTE — TOC Initial Note (Signed)
Transition of Care [facility]) - Initial/Assessment Note    Patient Details  Name: Joshua Gentry MRN: [id number] Date of Birth: 1946/07/08  [facility]) CM/SW Contact:    Karn Cassis, LCSW Phone Number: 04/14/2023, 9:05 AM  Clinical Narrative: Pt admitted due to acute respiratory failure with hypoxia. Assessment completed with pt's wife as pt oriented to self only per chart. Pt's wife reports pt was at [facility] overnight before returning to hospital. She would like new placement if possible, requesting [place] or [place]. LCSW will initiate bed search.                  Expected Discharge Plan: Skilled Nursing Facility Barriers to Discharge: Continued Medical Work up   Patient Goals and CMS Choice Patient states their goals for this hospitalization and ongoing recovery are:: New SNF   Choice offered to / list presented to : Spouse      Expected Discharge Plan and Services In-house Referral: Clinical Social Work   Post Acute Care Choice: Skilled Nursing Facility                                        Prior Living Arrangements/Services     Patient language and need for interpreter reviewed:: Yes Do you feel safe going back to the place where you live?: Yes      Need for Family Participation in Patient Care: Yes (Comment) Care giver support system in place?: Yes (comment)   Criminal Activity/Legal Involvement Pertinent to Current Situation/Hospitalization: No - Comment as needed  Activities of Daily Living      Permission Sought/Granted                  Emotional Assessment       Orientation: : Oriented to Self Alcohol / Substance Use: Not Applicable Psych Involvement: No (comment)  Admission diagnosis:  Respiratory distress [R06.03] Acute respiratory failure with hypoxia (HCC) [J96.01] HCAP (healthcare-associated pneumonia) [J18.9] Patient Active Problem List   Diagnosis Date Noted   Acute respiratory  failure with hypoxia (HCC) 04/13/2023   Elevated brain natriuretic peptide (BNP) level 04/13/2023   Chronic kidney disease, stage 3b (HCC) 04/13/2023   Lactic acidosis 04/13/2023   Right renal mass 04/05/2023   Acute hypoxic respiratory failure (HCC) 04/05/2023   Recurrent pneumonia 04/05/2023   COPD (chronic obstructive pulmonary disease) (HCC) 04/05/2023   Hypercalcemia 04/05/2023   Gross hematuria 03/18/2023   Leukocytosis/SIRS 03/16/2023   Normocytic anemia 03/16/2023   Acute renal failure (ARF) (HCC) 03/16/2023   Renal mass 03/16/2023   Multiple lung nodules concerning for metastatic disesae 03/16/2023   OSA on CPAP 04/09/2015   Liposarcoma (HCC) 07/06/2013   RBBB plus LA hemiblock 03/21/2013   Obesity (BMI 30-39.9) 07/22/2009   Dyslipidemia, goal LDL below 70 01/09/2009   Essential hypertension 01/09/2009   Hx of CABG 01/09/2009   PCP:  Assunta Found, MD Pharmacy:   [facility] [phone] - [place], [place] - [address] AT [facility] OF [address] & [address] [place] [address] Phone: [phone] Fax: [phone]     Social Determinants of Health (SDOH) Social History: SDOH Screenings   Food Insecurity: No Food Insecurity (03/22/2023)  Housing: Low Risk  (03/22/2023)  Transportation Needs: No Transportation Needs (03/22/2023)  Utilities: Not At Risk (03/22/2023)  Depression (PHQ2-9): Low Risk  (03/22/2023)  Financial Resource Strain: Low Risk  (  03/22/2023)  Tobacco Use: Medium Risk (04/13/2023)   SDOH Interventions:     Readmission Risk Interventions    04/14/2023    8:59 AM  Readmission Risk Prevention Plan  Transportation Screening Complete  HRI or Home Care Consult Complete  Social Work Consult for Recovery Care Planning/Counseling Complete  Palliative Care Screening Not Applicable  Medication Review Oceanographer) Complete

## 2023-04-14 NOTE — Progress Notes (Signed)
*  PRELIMINARY RESULTS* Echocardiogram 2D Echocardiogram has not been performed at present time. Patient on bed pan.  Stacey Drain 04/14/2023, 11:05 AM

## 2023-04-14 NOTE — Progress Notes (Addendum)
PROGRESS NOTE  Joshua Gentry LKG:[id number] DOB: December 04, 1945 DOA: 04/13/2023 PCP: Assunta Found, MD  HPI/Recap of past 24 hours: Joshua Gentry is a 77 y.o. male with medical history significant of hypertension, hyperlipidemia, OSA on CPAP, CAD s/p CABG (97), obesity, sarcoma who presents to the emergency department to the emergency department from SNF due to increased work of breathing while on oxygen.  He was noted to have increased dependency on oxygen at the nursing facility today, so EMS was activated and patient was sent to the ED for further evaluation and management.  DuoNeb, Solu-Medrol, magnesium was given en route. He was recently hospitalized from 730-8/1 due to having gross hematuria which was later on noted to be due to right renal mass that was concerning for RCC with suspicion for pulmonary metastasis.  Urology was consulted at that time and elective surgery was planned.  On 8/19, patient underwent right robotic assisted laparoscopic radical nephrectomy.  He was intubated for the procedure under general anesthesia.  Patient developed hypoxia postop and required NRB, TRH was consulted and patient was noted to have acute respiratory failure with hypoxia due to pneumonia.  He also developed acute metabolic encephalopathy.  Patient was discharged to SNF on 04/12/23.  04/14/23: The patient was seen and examined at his bedside.  Denies having any chest pain.  BNP is elevated greater than 3000.  Currently on IV diuresing.  Heparin drip was started on admission due to concern for possible pulmonary embolism.  IV heparin switched to full dose subcu Lovenox.  Appreciate pharmacy's assistance.     Assessment/Plan: Principal Problem:   Acute respiratory failure with hypoxia (HCC) Active Problems:   Recurrent pneumonia   COPD (chronic obstructive pulmonary disease) (HCC)   Hypercalcemia   Essential hypertension   OSA on CPAP   Obesity (BMI 30-39.9)   Elevated brain natriuretic peptide  (BNP) level   Chronic kidney disease, stage 3b (HCC)   Lactic acidosis  Acute respiratory failure with hypoxia secondary to multifactorial This is possibly due to recurrent pneumonia, pleural effusion, pulmonary edema, COPD, underlying OSA and physical deconditioning.  Continue supplemental oxygen to maintain O2 sats > 92% Wean off oxygen supplementation as able.   Recurrent pneumonia Patient was started on vancomycin and cefepime, we shall continue same at this time with plan to de-escalate/discontinue based on blood culture, sputum culture, urine Legionella, strep pneumo and procalcitonin Continue Tylenol as needed Continue Mucinex, incentive spirometry, flutter valve  Personally reviewed imaging with worsening bibasilar infiltrates with new small right pleural effusion.   Elevated BNP rule out acute CHF BNP 3,483 Chest x-ray showed new small right pleural effusion which could reflect edema or infection Continue total input/output, daily weights and fluid restriction IV Lasix 20 mg x 1 was given.  Continue IV Lasix 20 mg twice daily Continue Cardiac diet  Echocardiogram in the morning    Possible pulmonary embolism D-dimer 1.83 Patient was started on IV heparin drip in the ED, switched to full dose subcu Lovenox on 04/14/2023   Hypercalcemia possibly due to metastatic RCC Corrected calcium to low albumin-12.7 (this was 2.8 on 8/24) IV hydration will be held at this time due to elevated BNP with suspicion for CHF Dr. Ellin Saba was consulted prior to discharge and recommended IV Zometa 4 mg x 1.  He has already been consulted by EDP and we shall await further recommendations  Leukocytosis WBC 1 28K Will obtain peripheral blood smear to further assess   Lactic acidosis -resolved Lactic acid  2.1 > 1.8   Stage IIIb CKD Creatinine at 1.64.  This has been stable in the 1.5-1.6 range since 8/19 Renally adjust medications, avoid nephrotoxic agents/dehydration/hypotension   COPD  (chronic obstructive pulmonary disease)  Stable, no clinical bronchospasm.   OSA on CPAP Continue CPAP nightly.    Essential hypertension Stable blood pressure Continue home meds   Obesity (BMI 31.19) Diet and lifestyle modification  Generalized weakness PT OT assessment Fall precautions.      DVT prophylaxis: Full dose subcu Lovenox.    Advance Care Planning: Full code   Consults: Oncology   Family Communication: None at bedside        Status is: Inpatient The patient requires at least 2 midnights for further evaluation and treatment of present condition.    Objective: Vitals:   04/14/23 1410 04/14/23 1420 04/14/23 1500 04/14/23 1615  BP:   (!) 103/55   Pulse: 76 75 82   Resp: 20 17 20    Temp:    97.7 F (36.5 C)  TempSrc:    Oral  SpO2: 96% 98% 98%   Weight:      Height:        Intake/Output Summary (Last 24 hours) at 04/14/2023 1636 Last data filed at 04/14/2023 1400 Gross per 24 hour  Intake 896.05 ml  Output --  Net 896.05 ml   Filed Weights   04/13/23 1640  Weight: 110.2 kg    Exam:  General: 77 y.o. year-old male well developed well nourished in no acute distress.  Alert and interactive. Cardiovascular: Regular rate and rhythm with no rubs or gallops.  No thyromegaly or JVD noted.   Respiratory: Mild rales at bases.  Poor inspiratory effort. Abdomen: Soft nontender nondistended with normal bowel sounds x4 quadrants. Musculoskeletal: No lower extremity edema. 2/4 pulses in all 4 extremities. Skin: No ulcerative lesions noted or rashes, Psychiatry: Mood is appropriate for condition and setting   Data Reviewed: CBC: Recent Labs  Lab 04/08/23 1422 04/09/23 0423 04/11/23 0607 04/12/23 0454 04/13/23 1643 04/14/23 0552  WBC 122.6* 122.5* 101.2* 77.4* 111.2* 128.2*  NEUTROABS 115.2* 118.8*  --  72.8* 102.3*  --   HGB 9.3* 9.6* 11.4* 10.8* 11.0* 10.2*  HCT 29.9* 31.7* 37.0* 36.1* 35.8* 33.3*  MCV 86.9 89.3 86.9 87.6 87.3 87.6  PLT  315 340 323 263 240 210   Basic Metabolic Panel: Recent Labs  Lab 04/10/23 0208 04/10/23 1733 04/11/23 0607 04/12/23 0454 04/13/23 1643 04/14/23 0552  NA 138  --  136 138 137 138  K 3.7  --  3.2* 3.9 4.0 3.8  CL 107  --  103 105 103 106  CO2 24  --  25 25 23 24   GLUCOSE 92  --  95 102* 130* 145*  BUN 32*  --  29* 27* 27* 28*  CREATININE 1.51*  --  1.46* 1.50* 1.64* 1.83*  CALCIUM 11.4* 11.5* 11.7* 11.4* 11.5* 10.7*  MG  --   --   --  1.9  --  2.3  PHOS  --   --   --   --   --  2.6   GFR: Estimated Creatinine Clearance: 46.1 mL/min (A) (by C-G formula based on SCr of 1.83 mg/dL (H)). Liver Function Tests: Recent Labs  Lab 04/10/23 0208 04/12/23 0454 04/13/23 1643 04/14/23 0552  AST  --  17 19 19   ALT  --  17 18 16   ALKPHOS  --  227* 290* 280*  BILITOT  --  1.0 1.1  1.1  PROT  --  5.2* 5.8* 5.5*  ALBUMIN 2.2* 2.3* 2.5* 2.4*   No results for input(s): "LIPASE", "AMYLASE" in the last 168 hours. No results for input(s): "AMMONIA" in the last 168 hours. Coagulation Profile: No results for input(s): "INR", "PROTIME" in the last 168 hours. Cardiac Enzymes: No results for input(s): "CKTOTAL", "CKMB", "CKMBINDEX", "TROPONINI" in the last 168 hours. BNP (last 3 results) No results for input(s): "PROBNP" in the last 8760 hours. HbA1C: No results for input(s): "HGBA1C" in the last 72 hours. CBG: No results for input(s): "GLUCAP" in the last 168 hours. Lipid Profile: No results for input(s): "CHOL", "HDL", "LDLCALC", "TRIG", "CHOLHDL", "LDLDIRECT" in the last 72 hours. Thyroid Function Tests: No results for input(s): "TSH", "T4TOTAL", "FREET4", "T3FREE", "THYROIDAB" in the last 72 hours. Anemia Panel: No results for input(s): "VITAMINB12", "FOLATE", "FERRITIN", "TIBC", "IRON", "RETICCTPCT" in the last 72 hours. Urine analysis:    Component Value Date/Time   COLORURINE RED (A) 03/16/2023 1301   APPEARANCEUR HAZY (A) 03/16/2023 1301   LABSPEC 1.012 03/16/2023 1301    PHURINE 6.0 03/16/2023 1301   GLUCOSEU NEGATIVE 03/16/2023 1301   HGBUR LARGE (A) 03/16/2023 1301   BILIRUBINUR NEGATIVE 03/16/2023 1301   KETONESUR NEGATIVE 03/16/2023 1301   PROTEINUR 100 (A) 03/16/2023 1301   NITRITE NEGATIVE 03/16/2023 1301   LEUKOCYTESUR TRACE (A) 03/16/2023 1301   Sepsis Labs: @LABRCNTIP (procalcitonin:4,lacticidven:4)  ) Recent Results (from the past 240 hour(s))  SARS Coronavirus 2 by RT PCR (hospital order, performed in [facility] hospital lab) *cepheid single result test* Anterior Nasal Swab     Status: None   Collection Time: 04/05/23  8:02 PM   Specimen: Anterior Nasal Swab  Result Value Ref Range Status   SARS Coronavirus 2 by RT PCR NEGATIVE NEGATIVE Final    Comment: (NOTE) SARS-CoV-2 target nucleic acids are NOT DETECTED.  The SARS-CoV-2 RNA is generally detectable in upper and lower respiratory specimens during the acute phase of infection. The lowest concentration of SARS-CoV-2 viral copies this assay can detect is 250 copies / mL. A negative result does not preclude SARS-CoV-2 infection and should not be used as the sole basis for treatment or other patient management decisions.  A negative result may occur with improper specimen collection / handling, submission of specimen other than nasopharyngeal swab, presence of viral mutation(s) within the areas targeted by this assay, and inadequate number of viral copies (<250 copies / mL). A negative result must be combined with clinical observations, patient history, and epidemiological information.  Fact Sheet for Patients:   RoadLapTop.co.za  Fact Sheet for Healthcare Providers: http://kim-miller.com/  This test is not yet approved or  cleared by the [place] FDA and has been authorized for detection and/or diagnosis of SARS-CoV-2 by FDA under an Emergency Use Authorization (EUA).  This EUA will remain in effect (meaning this test can be used)  for the duration of the COVID-19 declaration under Section 564(b)(1) of the Act, 21 U.S.C. section 360bbb-3(b)(1), unless the authorization is terminated or revoked sooner.  Performed at [facility], [address], [place], [place] [address]   Resp panel by RT-PCR (RSV, Flu A&B, Covid) Anterior Nasal Swab     Status: None   Collection Time: 04/13/23  4:43 PM   Specimen: Anterior Nasal Swab  Result Value Ref Range Status   SARS Coronavirus 2 by RT PCR NEGATIVE NEGATIVE Final    Comment: (NOTE) SARS-CoV-2 target nucleic acids are NOT DETECTED.  The SARS-CoV-2 RNA is generally detectable in  upper respiratory specimens during the acute phase of infection. The lowest concentration of SARS-CoV-2 viral copies this assay can detect is 138 copies/mL. A negative result does not preclude SARS-Cov-2 infection and should not be used as the sole basis for treatment or other patient management decisions. A negative result may occur with  improper specimen collection/handling, submission of specimen other than nasopharyngeal swab, presence of viral mutation(s) within the areas targeted by this assay, and inadequate number of viral copies(<138 copies/mL). A negative result must be combined with clinical observations, patient history, and epidemiological information. The expected result is Negative.  Fact Sheet for Patients:  BloggerCourse.com  Fact Sheet for Healthcare Providers:  SeriousBroker.it  This test is no t yet approved or cleared by the [place] FDA and  has been authorized for detection and/or diagnosis of SARS-CoV-2 by FDA under an Emergency Use Authorization (EUA). This EUA will remain  in effect (meaning this test can be used) for the duration of the COVID-19 declaration under Section 564(b)(1) of the Act, 21 U.S.C.section 360bbb-3(b)(1), unless the authorization is terminated  or revoked sooner.       Influenza A by PCR  NEGATIVE NEGATIVE Final   Influenza B by PCR NEGATIVE NEGATIVE Final    Comment: (NOTE) The Xpert Xpress SARS-CoV-2/FLU/RSV plus assay is intended as an aid in the diagnosis of influenza from Nasopharyngeal swab specimens and should not be used as a sole basis for treatment. Nasal washings and aspirates are unacceptable for Xpert Xpress SARS-CoV-2/FLU/RSV testing.  Fact Sheet for Patients: BloggerCourse.com  Fact Sheet for Healthcare Providers: SeriousBroker.it  This test is not yet approved or cleared by the [place] FDA and has been authorized for detection and/or diagnosis of SARS-CoV-2 by FDA under an Emergency Use Authorization (EUA). This EUA will remain in effect (meaning this test can be used) for the duration of the COVID-19 declaration under Section 564(b)(1) of the Act, 21 U.S.C. section 360bbb-3(b)(1), unless the authorization is terminated or revoked.     Resp Syncytial Virus by PCR NEGATIVE NEGATIVE Final    Comment: (NOTE) Fact Sheet for Patients: BloggerCourse.com  Fact Sheet for Healthcare Providers: SeriousBroker.it  This test is not yet approved or cleared by the [place] FDA and has been authorized for detection and/or diagnosis of SARS-CoV-2 by FDA under an Emergency Use Authorization (EUA). This EUA will remain in effect (meaning this test can be used) for the duration of the COVID-19 declaration under Section 564(b)(1) of the Act, 21 U.S.C. section 360bbb-3(b)(1), unless the authorization is terminated or revoked.  Performed at [facility], [address] [place], [place] [address]   Culture, blood (routine x 2) Call MD if unable to obtain prior to antibiotics being given     Status: None (Preliminary result)   Collection Time: 04/13/23  4:45 PM   Specimen: BLOOD LEFT HAND  Result Value Ref Range Status   Specimen Description BLOOD LEFT HAND   Final   Special Requests   Final    BOTTLES DRAWN AEROBIC AND ANAEROBIC Blood Culture adequate volume   Culture   Final    NO GROWTH < 12 HOURS Performed at [facility], [address], [place], [place] [address]    Report Status PENDING  Incomplete  Culture, blood (routine x 2) Call MD if unable to obtain prior to antibiotics being given     Status: None (Preliminary result)   Collection Time: 04/13/23 10:17 PM   Specimen: BLOOD LEFT HAND  Result Value Ref Range Status  Specimen Description   Final    BLOOD LEFT HAND Performed at [facility], [address] [place], [place] [address]    Special Requests   Final    BOTTLES DRAWN AEROBIC AND ANAEROBIC Blood Culture results may not be optimal due to an excessive volume of blood received in culture bottles   Culture   Final    NO GROWTH < 12 HOURS Performed at [facility], [address] [place] [address] [place], [place] [address]    Report Status PENDING  Incomplete      Studies: ECHOCARDIOGRAM COMPLETE  Result Date: 04/14/2023    ECHOCARDIOGRAM REPORT   Patient Name:   Joshua Gentry Date of Exam: 04/14/2023 Medical Rec #:  [id number]        Height:       74.0 in Accession #:    [id number]       Weight:       242.9 lb Date of Birth:  Oct 14, 1945        BSA:          2.361 m Patient Age:    77 years         BP:           117/57 mmHg Patient Gender: M                HR:           85 bpm. [facility] Procedure: 2D Echo, Cardiac Doppler, Color Doppler and Intracardiac            Opacification Agent Indications:    CHF-Acute Systolic I50.21  History:        Patient has no prior history of Echocardiogram examinations.                 Prior CABG, COPD, Arrythmias:RBBB; Risk Factors:Hypertension and                 Dyslipidemia.  Sonographer:    Celesta Gentile RCS Referring Phys: [id number] OLADAPO ADEFESO IMPRESSIONS  1. Left ventricular ejection fraction, by estimation, is 70 to 75%. The left ventricle has hyperdynamic function. The left  ventricle has no regional wall motion abnormalities. Left ventricular diastolic parameters are consistent with Grade I diastolic dysfunction (impaired relaxation).  2. Right ventricular systolic function is normal. The right ventricular size is normal. Tricuspid regurgitation signal is inadequate for assessing PA pressure.  3. The mitral valve is normal in structure. No evidence of mitral valve regurgitation. No evidence of mitral stenosis.  4. The aortic valve has an indeterminant number of cusps. Aortic valve regurgitation is not visualized. No aortic stenosis is present. FINDINGS  Left Ventricle: Left ventricular ejection fraction, by estimation, is 70 to 75%. The left ventricle has hyperdynamic function. The left ventricle has no regional wall motion abnormalities. Definity contrast agent was given IV to delineate the left ventricular endocardial borders. The left ventricular internal cavity size was normal in size. There is no left ventricular hypertrophy. Left ventricular diastolic parameters are consistent with Grade I diastolic dysfunction (impaired relaxation). Normal  left ventricular filling pressure. Right Ventricle: The right ventricular size is normal. Right vetricular wall thickness was not well visualized. Right ventricular systolic function is normal. Tricuspid regurgitation signal is inadequate for assessing PA pressure. Left Atrium: Left atrial size was normal in size. Right Atrium: Right atrial size was normal in size. Pericardium: There is no evidence of pericardial effusion. Mitral Valve: The mitral valve is normal in structure.  No evidence of mitral valve regurgitation. No evidence of mitral valve stenosis. Tricuspid Valve: The tricuspid valve is normal in structure. Tricuspid valve regurgitation is not demonstrated. No evidence of tricuspid stenosis. Aortic Valve: The aortic valve has an indeterminant number of cusps. Aortic valve regurgitation is not visualized. No aortic stenosis is  present. Aortic valve mean gradient measures 6.0 mmHg. Aortic valve peak gradient measures 12.5 mmHg. Aortic valve area, by VTI measures 2.71 cm. Pulmonic Valve: The pulmonic valve was not well visualized. Pulmonic valve regurgitation is not visualized. No evidence of pulmonic stenosis. Aorta: The aortic root is normal in size and structure. Venous: The inferior vena cava was not well visualized. IAS/Shunts: The interatrial septum was not well visualized.  LEFT VENTRICLE PLAX 2D LVIDd:         5.00 cm   Diastology LVIDs:         2.80 cm   LV e' medial:    5.55 cm/s LV PW:         1.10 cm   LV E/e' medial:  11.8 LV IVS:        1.00 cm   LV e' lateral:   10.00 cm/s LVOT diam:     2.00 cm   LV E/e' lateral: 6.6 LV SV:         89 LV SV Index:   38 LVOT Area:     3.14 cm  RIGHT VENTRICLE RV S prime:     15.40 cm/s TAPSE (M-mode): 1.4 cm LEFT ATRIUM           Index        RIGHT ATRIUM           Index LA diam:      4.35 cm 1.84 cm/m   RA Area:     17.90 cm LA Vol (A2C): 47.0 ml 19.91 ml/m  RA Volume:   47.00 ml  19.91 ml/m LA Vol (A4C): 69.9 ml 29.61 ml/m  AORTIC VALVE AV Area (Vmax):    2.81 cm AV Area (Vmean):   2.78 cm AV Area (VTI):     2.71 cm AV Vmax:           176.77 cm/s AV Vmean:          113.616 cm/s AV VTI:            0.328 m AV Peak Grad:      12.5 mmHg AV Mean Grad:      6.0 mmHg LVOT Vmax:         158.00 cm/s LVOT Vmean:        100.367 cm/s LVOT VTI:          0.283 m LVOT/AV VTI ratio: 0.86  AORTA Ao Root diam: 3.20 cm MITRAL VALVE MV Area (PHT): 1.46 cm    SHUNTS MV Decel Time: 521 msec    Systemic VTI:  0.28 m MV E velocity: 65.60 cm/s  Systemic Diam: 2.00 cm MV A velocity: 87.80 cm/s MV E/A ratio:  0.75 Dina Rich MD Electronically signed by Dina Rich MD Signature Date/Time: 04/14/2023/3:53:35 PM    Final    DG Chest Port 1 View  Result Date: 04/13/2023 CLINICAL DATA:  Short of breath, respiratory distress EXAM: PORTABLE CHEST 1 VIEW COMPARISON:  04/05/2023 FINDINGS: 2 frontal  views of the chest demonstrate a stable cardiac silhouette. Postsurgical changes from prior CABG. Background emphysema is again noted. Numerous bilateral pulmonary nodules are seen, most pronounced at the lung bases, concerning for metastatic disease  based on prior CT findings. There are persistent areas of bibasilar consolidation, right greater than left, slightly increased since prior study. Trace right pleural effusion. No pneumothorax. No acute bony abnormalities. IMPRESSION: 1. Bilateral pulmonary nodules again noted, consistent with suspected metastatic disease. Please see prior CT evaluation 03/16/2023. 2. Increasing bibasilar consolidation, with new small right pleural effusion, which could reflect edema or infection. 3. Emphysema. Electronically Signed   By: Sharlet Salina M.D.   On: 04/13/2023 17:36    Scheduled Meds:  Chlorhexidine Gluconate Cloth  6 each Topical Daily   dextromethorphan-guaiFENesin  1 tablet Oral BID   metoprolol succinate  100 mg Oral Daily   QUEtiapine  25 mg Oral QHS    Continuous Infusions:  ceFEPime (MAXIPIME) IV 2 g (04/14/23 0847)   heparin 1,650 Units/hr (04/13/23 2211)   vancomycin       LOS: 1 day     Joshua Drop, MD [facility] Pager [phone]  If 7PM-7AM, please contact night-coverage [email or web address] Password [facility] 04/14/2023, 4:36 PM

## 2023-04-14 NOTE — Progress Notes (Signed)
*  PRELIMINARY RESULTS* Echocardiogram 2D Echocardiogram has been performed with Definity.  Stacey Drain 04/14/2023, 2:18 PM

## 2023-04-14 NOTE — Progress Notes (Signed)
ANTICOAGULATION CONSULT NOTE - Follow Up Consult  Pharmacy Consult for Heparin>>Lovenox Indication:  rule out PE  Allergies  Allergen Reactions   Fire Ant Anaphylaxis   Niacin    Penicillins     Patient can't recall reaction- happened a long time ago   Tape Hives   Wound Dressing Adhesive Rash    Patient Measurements: Height: 6\' 2"  (188 cm) Weight: 110.2 kg (242 lb 15.2 oz) IBW/kg (Calculated) : 82.2 Heparin Dosing Weight: 105 kg  Vital Signs: Temp: 97.7 F (36.5 C) (08/28 1615) Temp Source: Oral (08/28 1615) BP: 103/55 (08/28 1500) Pulse Rate: 82 (08/28 1500)  Labs: Recent Labs    04/12/23 0454 04/13/23 1643 04/14/23 0552  HGB 10.8* 11.0* 10.2*  HCT 36.1* 35.8* 33.3*  PLT 263 240 210  HEPARINUNFRC  --   --  0.18*  CREATININE 1.50* 1.64* 1.83*    Estimated Creatinine Clearance: 46.1 mL/min (A) (by C-G formula based on SCr of 1.83 mg/dL (H)).   Medications:  Medications Prior to Admission  Medication Sig Dispense Refill Last Dose   calcium-vitamin D 250-100 MG-UNIT per tablet Take 1 tablet by mouth 2 (two) times daily.   04/04/2023   metoprolol succinate (TOPROL XL) 100 MG 24 hr tablet Take 1 tablet (100 mg total) by mouth daily. Take with or immediately following a meal. 90 tablet 3 04/13/2023   Multiple Vitamins-Minerals (MULTIVITAMIN WITH MINERALS) tablet Take 1 tablet by mouth daily.   04/04/2023   nitroGLYCERIN (NITROSTAT) 0.4 MG SL tablet DISSOLVE 1 TABLET UNDER THE TONGUE EVERY 5 MINUTES AS NEEDED FOR CHEST PAIN, DO NOT EXCEED A TOTAL OF 3 DOSES IN 15 MINUTES 25 tablet 3 unknown   QUEtiapine (SEROQUEL) 25 MG tablet Take 1 tablet (25 mg total) by mouth at bedtime. 30 tablet 11 04/12/2023   rosuvastatin (CRESTOR) 20 MG tablet TAKE 1 TABLET(20 MG) BY MOUTH DAILY (Patient taking differently: Take 20 mg by mouth daily.) 90 tablet 3 04/13/2023   spironolactone (ALDACTONE) 25 MG tablet Take 25 mg by mouth daily.   Past Month    Assessment: Patient started heparin  infusion at 1650 units/hr without bolus on 8/27 for rule out PE. Heparin level 0.18 - subtherapeutic. Hemoglobin lowers to 10.2. Platelets WNL.   Ok to change heparin to Lovenox while r/o PE per Dr. Margo Aye.  Scr 1.83>>crcl greater than 16ml/min  Goal of Therapy:  Anti-Xa: 0.6-1 Monitor platelets by anticoagulation protocol: Yes   Plan:  Dc heparin Lovenox 110mg  SQ q12 F/u r/o PE  Ulyses Southward, PharmD, BCIDP, AAHIVP, CPP Infectious Disease Pharmacist 04/14/2023 4:39 PM

## 2023-04-14 NOTE — TOC Progression Note (Signed)
Transition of Care [facility]) - Progression Note    Patient Details  Name: Joshua Gentry MRN: [id number] Date of Birth: [birth year]  Transition of Care [facility]) CM/SW Contact  Karn Cassis, [place] Phone Number: 04/14/2023, 11:22 AM  Clinical Narrative: Pt's wife accepts bed at [facility]. She is aware pt will need to bring CPAP from home. TOC will follow.      Expected Discharge Plan: Skilled Nursing Facility Barriers to Discharge: Continued Medical Work up  Expected Discharge Plan and Services In-house Referral: Clinical Social Work   Post Acute Care Choice: Skilled Nursing Facility                                         Social Determinants of Health (SDOH) Interventions SDOH Screenings   Food Insecurity: No Food Insecurity (03/22/2023)  Housing: Low Risk  (03/22/2023)  Transportation Needs: No Transportation Needs (03/22/2023)  Utilities: Not At Risk (03/22/2023)  Depression (PHQ2-9): Low Risk  (03/22/2023)  Financial Resource Strain: Low Risk  (03/22/2023)  Tobacco Use: Medium Risk (04/13/2023)    Readmission Risk Interventions    04/14/2023    8:59 AM  Readmission Risk Prevention Plan  Transportation Screening Complete  HRI or Home Care Consult Complete  Social Work Consult for Recovery Care Planning/Counseling Complete  Palliative Care Screening Not Applicable  Medication Review Oceanographer) Complete

## 2023-04-14 NOTE — [place]2 (Signed)
[place] MEDICAID [place]2 LEVEL OF CARE FORM     IDENTIFICATION  Patient Name: Joshua Gentry Birthdate: 1946/04/12 Sex: male Admission Date (Current Location): 04/13/2023  [facility] and [place] Number:      Facility and Address:  [facility],  [address], [place] [address]      Provider Number:    Attending Physician Name and Address:  Darlin Drop, DO  Relative Name and Phone Number:       Current Level of Care: Hospital Recommended Level of Care: Skilled Nursing Facility Prior Approval Number:    Date Approved/Denied:   PASRR Number: [id number]  Discharge Plan:      Current Diagnoses: Patient Active Problem List   Diagnosis Date Noted   Acute respiratory failure with hypoxia (HCC) 04/13/2023   Elevated brain natriuretic peptide (BNP) level 04/13/2023   Chronic kidney disease, stage 3b (HCC) 04/13/2023   Lactic acidosis 04/13/2023   Right renal mass 04/05/2023   Acute hypoxic respiratory failure (HCC) 04/05/2023   Recurrent pneumonia 04/05/2023   COPD (chronic obstructive pulmonary disease) (HCC) 04/05/2023   Hypercalcemia 04/05/2023   Gross hematuria 03/18/2023   Leukocytosis/SIRS 03/16/2023   Normocytic anemia 03/16/2023   Acute renal failure (ARF) (HCC) 03/16/2023   Renal mass 03/16/2023   Multiple lung nodules concerning for metastatic disesae 03/16/2023   OSA on CPAP 04/09/2015   Liposarcoma (HCC) 07/06/2013   RBBB plus LA hemiblock 03/21/2013   Obesity (BMI 30-39.9) 07/22/2009   Dyslipidemia, goal LDL below 70 01/09/2009   Essential hypertension 01/09/2009   Hx of CABG 01/09/2009    Orientation RESPIRATION BLADDER Height & Weight     Self  O2, Other (Comment) (4L. CPAP at night.) Continent Weight: 242 lb 15.2 oz (110.2 kg) Height:  6\' 2"  (188 cm)  BEHAVIORAL SYMPTOMS/MOOD NEUROLOGICAL BOWEL NUTRITION STATUS      Continent Diet (Heart heathy. See d/c summary for updates.)  AMBULATORY STATUS COMMUNICATION OF NEEDS Skin    Extensive Assist Verbally Bruising, Skin abrasions, Surgical wounds                       Personal Care Assistance Level of Assistance  Feeding, Dressing Bathing Assistance: Maximum assistance Feeding assistance: Limited assistance Dressing Assistance: Maximum assistance     Functional Limitations Info  Sight, Hearing, Speech Sight Info: Impaired Hearing Info: Adequate Speech Info: Adequate    SPECIAL CARE FACTORS FREQUENCY  PT (By licensed PT)     PT Frequency: 5x weekly              Contractures      Additional Factors Info  Code Status, Allergies, Psychotropic Code Status Info: Full code Allergies Info: Fire ant, Niacin, Penicillins, Tape, Wound Dressing Adhesive Psychotropic Info: Seroquel         Current Medications (04/14/2023):  This is the current hospital active medication list Current Facility-Administered Medications  Medication Dose Route Frequency Provider Last Rate Last Admin   acetaminophen (TYLENOL) tablet 650 mg  650 mg Oral Q6H PRN Adefeso, Oladapo, DO       Or   acetaminophen (TYLENOL) suppository 650 mg  650 mg Rectal Q6H PRN Adefeso, Oladapo, DO       ceFEPIme (MAXIPIME) 2 g in sodium chloride 0.9 % 100 mL IVPB  2 g Intravenous Q12H Adefeso, Oladapo, DO 200 mL/hr at 04/14/23 0847 2 g at 04/14/23 0847   Chlorhexidine Gluconate Cloth 2 % PADS 6 each  6 each Topical Daily Dow Adolph  N, DO       dextromethorphan-guaiFENesin (MUCINEX DM) 30-600 MG per 12 hr tablet 1 tablet  1 tablet Oral BID Adefeso, Oladapo, DO       heparin ADULT infusion 100 units/mL (25000 units/249mL)  1,950 Units/hr Intravenous Continuous Earnie Larsson, RPH 16.5 mL/hr at 04/13/23 2211 1,650 Units/hr at 04/13/23 2211   metoprolol succinate (TOPROL-XL) 24 hr tablet 100 mg  100 mg Oral Daily Adefeso, Oladapo, DO       ondansetron (ZOFRAN) tablet 4 mg  4 mg Oral Q6H PRN Adefeso, Oladapo, DO       Or   ondansetron (ZOFRAN) injection 4 mg  4 mg Intravenous Q6H PRN Adefeso,  Oladapo, DO       QUEtiapine (SEROQUEL) tablet 25 mg  25 mg Oral QHS Adefeso, Oladapo, DO   25 mg at 04/13/23 2207   [START ON 04/15/2023] vancomycin (VANCOREADY) IVPB 1750 mg/350 mL  1,750 mg Intravenous Q36H Frankey Shown, DO         Discharge Medications: Please see discharge summary for a list of discharge medications.  Relevant Imaging Results:  Relevant Lab Results:   Additional Information [id number]  Karn Cassis, LCSW

## 2023-04-15 DIAGNOSIS — J9601 Acute respiratory failure with hypoxia: Secondary | ICD-10-CM | POA: Diagnosis not present

## 2023-04-15 LAB — CBC
HCT: 34.2 % — ABNORMAL LOW (ref 39.0–52.0)
Hemoglobin: 10.6 g/dL — ABNORMAL LOW (ref 13.0–17.0)
MCH: 27.1 pg (ref 26.0–34.0)
MCHC: 31 g/dL (ref 30.0–36.0)
MCV: 87.5 fL (ref 80.0–100.0)
Platelets: 220 10*3/uL (ref 150–400)
RBC: 3.91 MIL/uL — ABNORMAL LOW (ref 4.22–5.81)
RDW: 17 % — ABNORMAL HIGH (ref 11.5–15.5)
WBC: 131.4 10*3/uL (ref 4.0–10.5)
nRBC: 0 % (ref 0.0–0.2)

## 2023-04-15 LAB — MRSA NEXT GEN BY PCR, NASAL: MRSA by PCR Next Gen: NOT DETECTED

## 2023-04-15 LAB — STREP PNEUMONIAE URINARY ANTIGEN: Strep Pneumo Urinary Antigen: NEGATIVE

## 2023-04-15 MED ORDER — ADULT MULTIVITAMIN W/MINERALS CH
1.0000 | ORAL_TABLET | Freq: Every day | ORAL | Status: DC
  Administered 2023-04-15 – 2023-04-18 (×4): 1 via ORAL
  Filled 2023-04-15 (×4): qty 1

## 2023-04-15 MED ORDER — MIDODRINE HCL 5 MG PO TABS
10.0000 mg | ORAL_TABLET | ORAL | Status: AC
  Administered 2023-04-15: 10 mg via ORAL
  Filled 2023-04-15: qty 2

## 2023-04-15 MED ORDER — BOOST / RESOURCE BREEZE PO LIQD CUSTOM
1.0000 | Freq: Every day | ORAL | Status: DC
  Administered 2023-04-15 – 2023-04-19 (×4): 1 via ORAL

## 2023-04-15 MED ORDER — ENSURE ENLIVE PO LIQD
237.0000 mL | Freq: Two times a day (BID) | ORAL | Status: DC
  Administered 2023-04-15 – 2023-04-18 (×5): 237 mL via ORAL

## 2023-04-15 NOTE — Progress Notes (Signed)
PROGRESS NOTE  Joshua Gentry:[id number] DOB: Aug 01, 1946 DOA: 04/13/2023 PCP: Assunta Found, MD  HPI/Recap of past 24 hours: Joshua Gentry is a 77 y.o. male with medical history significant of hypertension, hyperlipidemia, OSA on CPAP, CAD s/p CABG (97), obesity, multiple lung nodules, recently diagnosed with right renal mass, liposarcoma who presents to [facility] emergency department from SNF due to increased work of breathing.  He was recently hospitalized from 7/30-8/1 due to having gross hematuria, later noted to be due to right renal mass suspicious for RCC.  Urology was consulted at that time and elective surgery was planned.  On 8/19, patient underwent right robotic assisted laparoscopic radical nephrectomy.  He was intubated for the procedure under general anesthesia.  Patient developed hypoxia postop and required NRB, TRH was consulted and patient was noted to have acute respiratory failure with hypoxia due to pneumonia.  He was treated with 5 days of IV steroids and IV antibiotics, IV Rocephin and azithromycin and discharged to SNF on 04/12/23.  Plan at discharge was to repeat a chest x-ray in 4 weeks to ensure resolution of pneumonia findings.    The patient returned to the ED the next day and was admitted for recurrent pneumonia and was started on IV vancomycin and cefepime.  Due to concern for possible PE heparin drip was initiated in the ED.  Heparin drip was switched to full dose subcu Lovenox twice daily on 04/14/2023.  04/15/23: Seen and examined with the patient's spouse at his bedside.  He has no new complaints.  His O2 requirement is improving.  WBC still quite elevated at 131K.  Discussed leukocytosis with hematology oncology Dr. Ellin Saba on 04/14/2023 via phone, suspects leukemoid reaction.  Recommends to continue to treat the infection with IV antibiotics until leukocytosis improves.     Assessment/Plan: Principal Problem:   Acute respiratory failure with hypoxia  (HCC) Active Problems:   Recurrent pneumonia   COPD (chronic obstructive pulmonary disease) (HCC)   Hypercalcemia   Essential hypertension   OSA on CPAP   Obesity (BMI 30-39.9)   Elevated brain natriuretic peptide (BNP) level   Chronic kidney disease, stage 3b (HCC)   Lactic acidosis  Acute respiratory failure with hypoxia secondary to multifactorial, improving Not on oxygen supplementation at baseline. Continue to treat underlying conditions, pneumonia, OSA, physical deconditioning Continue cefepime and IV vancomycin, obtain MRSA screening test if negative DC IV vancomycin. Continue CPAP nightly Continue PT OT with assistance and fall precautions Continue out of bed to chair as tolerated Maintain O2 saturation above 92% Continue to wean off oxygen supplementation as able   Recurrent pneumonia Patient was started on vancomycin and cefepime, we shall continue same at this time with plan to de-escalate/discontinue based on blood culture, sputum culture, urine Legionella, strep pneumo and procalcitonin Continue Tylenol as needed Continue Mucinex, incentive spirometry, flutter valve  Personally reviewed imaging with admission chest x-ray showing worsening bibasilar infiltrates with new small right pleural effusion.   Elevated BNP rule out acute CHF BNP 3,483 Chest x-ray showed new small right pleural effusion which could reflect edema or infection 2D echo revealed LVEF 70 to 75% with no regional wall motion abnormalities and grade 1 diastolic dysfunction. Continue strict I's and O's and daily weight Diuretics were DC'd due to soft BPs to avoid hypotension.   Possible pulmonary embolism D-dimer 1.83 Patient was started on IV heparin drip in the ED, switched to full dose subcu Lovenox on 04/14/2023 May consider switching to DOAC prior to  discharge.  The patient will need to follow-up with hematology oncology after discharge.   Hypercalcemia possibly of malignancy related to right  renal mass status post right kidney and adrenal nephrectomy. Levels are downtrending.  Exaggerated leukocytosis likely leukemoid reaction WBC 131.8K>> 131.4K Repeat CBC in the morning   Lactic acidosis -resolved Lactic acid 2.1 > 1.8   Stage IIIb CKD Creatinine 1.83 with GFR of 38 Continue to avoid nephrotoxic agents and hypotension Repeat renal function test in the morning   COPD (chronic obstructive pulmonary disease)  Stable, no clinical bronchospasm.   OSA on CPAP Continue CPAP nightly.    Essential hypertension Stable blood pressure Continue home meds   Obesity (BMI 31.19) Diet and lifestyle modification  Generalized weakness Continue PT OT with fall precautions.   Time: 55 minutes.      DVT prophylaxis: Full dose subcu Lovenox twice daily    Advance Care Planning: Full code   Consults: Hematology/oncology, Dr. Ellin Saba   Family Communication: Updated patient's wife at bedside.        Status is: Inpatient The patient requires at least 2 midnights for further evaluation and treatment of present condition.    Objective: Vitals:   04/15/23 0900 04/15/23 1000 04/15/23 1132 04/15/23 1200  BP: (!) 119/54 (!) 100/48  (!) 110/53  Pulse: 74 80  100  Resp: (!) 26 (!) 23  (!) 23  Temp:      TempSrc:   Oral   SpO2: 95% 96%  94%  Weight:      Height:        Intake/Output Summary (Last 24 hours) at 04/15/2023 1344 Last data filed at 04/15/2023 4854 Gross per 24 hour  Intake 816.46 ml  Output 700 ml  Net 116.46 ml   Filed Weights   04/13/23 1640 04/15/23 0700  Weight: 110.2 kg 106.6 kg    Exam:  General: 77 y.o. year-old male well developed well nourished in no acute distress.  Interactive. Cardiovascular: Regular rate and rhythm with no rubs or gallops.  No thyromegaly or JVD noted.   Respiratory: Faint rales at bases.  Good inspiratory effort. Abdomen: Soft nontender nondistended with normal bowel sounds x4 quadrants. Musculoskeletal: Trace  lower extremity edema bilaterally. Skin: No ulcerative lesions noted or rashes, Psychiatry: Mood is appropriate for condition and setting.   Data Reviewed: CBC: Recent Labs  Lab 04/08/23 1422 04/09/23 0423 04/11/23 6270 04/12/23 0454 04/13/23 1643 04/14/23 0552 04/14/23 1702 04/15/23 0403  WBC 122.6* 122.5*   < > 77.4* 111.2* 128.2* 131.8* 131.4*  NEUTROABS 115.2* 118.8*  --  72.8* 102.3*  --  118.9*  --   HGB 9.3* 9.6*   < > 10.8* 11.0* 10.2* 10.0* 10.6*  HCT 29.9* 31.7*   < > 36.1* 35.8* 33.3* 33.2* 34.2*  MCV 86.9 89.3   < > 87.6 87.3 87.6 87.8 87.5  PLT 315 340   < > 263 240 210 206 220   < > = values in this interval not displayed.   Basic Metabolic Panel: Recent Labs  Lab 04/10/23 0208 04/10/23 1733 04/11/23 0607 04/12/23 0454 04/13/23 1643 04/14/23 0552  NA 138  --  136 138 137 138  K 3.7  --  3.2* 3.9 4.0 3.8  CL 107  --  103 105 103 106  CO2 24  --  25 25 23 24   GLUCOSE 92  --  95 102* 130* 145*  BUN 32*  --  29* 27* 27* 28*  CREATININE 1.51*  --  1.46* 1.50* 1.64* 1.83*  CALCIUM 11.4* 11.5* 11.7* 11.4* 11.5* 10.7*  MG  --   --   --  1.9  --  2.3  PHOS  --   --   --   --   --  2.6   GFR: Estimated Creatinine Clearance: 45.4 mL/min (A) (by C-G formula based on SCr of 1.83 mg/dL (H)). Liver Function Tests: Recent Labs  Lab 04/10/23 0208 04/12/23 0454 04/13/23 1643 04/14/23 0552  AST  --  17 19 19   ALT  --  17 18 16   ALKPHOS  --  227* 290* 280*  BILITOT  --  1.0 1.1 1.1  PROT  --  5.2* 5.8* 5.5*  ALBUMIN 2.2* 2.3* 2.5* 2.4*   No results for input(s): "LIPASE", "AMYLASE" in the last 168 hours. No results for input(s): "AMMONIA" in the last 168 hours. Coagulation Profile: No results for input(s): "INR", "PROTIME" in the last 168 hours. Cardiac Enzymes: No results for input(s): "CKTOTAL", "CKMB", "CKMBINDEX", "TROPONINI" in the last 168 hours. BNP (last 3 results) No results for input(s): "PROBNP" in the last 8760 hours. HbA1C: No results for  input(s): "HGBA1C" in the last 72 hours. CBG: No results for input(s): "GLUCAP" in the last 168 hours. Lipid Profile: No results for input(s): "CHOL", "HDL", "LDLCALC", "TRIG", "CHOLHDL", "LDLDIRECT" in the last 72 hours. Thyroid Function Tests: No results for input(s): "TSH", "T4TOTAL", "FREET4", "T3FREE", "THYROIDAB" in the last 72 hours. Anemia Panel: No results for input(s): "VITAMINB12", "FOLATE", "FERRITIN", "TIBC", "IRON", "RETICCTPCT" in the last 72 hours. Urine analysis:    Component Value Date/Time   COLORURINE RED (A) 03/16/2023 1301   APPEARANCEUR HAZY (A) 03/16/2023 1301   LABSPEC 1.012 03/16/2023 1301   PHURINE 6.0 03/16/2023 1301   GLUCOSEU NEGATIVE 03/16/2023 1301   HGBUR LARGE (A) 03/16/2023 1301   BILIRUBINUR NEGATIVE 03/16/2023 1301   KETONESUR NEGATIVE 03/16/2023 1301   PROTEINUR 100 (A) 03/16/2023 1301   NITRITE NEGATIVE 03/16/2023 1301   LEUKOCYTESUR TRACE (A) 03/16/2023 1301   Sepsis Labs: @LABRCNTIP (procalcitonin:4,lacticidven:4)  ) Recent Results (from the past 240 hour(s))  SARS Coronavirus 2 by RT PCR (hospital order, performed in [facility] hospital lab) *cepheid single result test* Anterior Nasal Swab     Status: None   Collection Time: 04/05/23  8:02 PM   Specimen: Anterior Nasal Swab  Result Value Ref Range Status   SARS Coronavirus 2 by RT PCR NEGATIVE NEGATIVE Final    Comment: (NOTE) SARS-CoV-2 target nucleic acids are NOT DETECTED.  The SARS-CoV-2 RNA is generally detectable in upper and lower respiratory specimens during the acute phase of infection. The lowest concentration of SARS-CoV-2 viral copies this assay can detect is 250 copies / mL. A negative result does not preclude SARS-CoV-2 infection and should not be used as the sole basis for treatment or other patient management decisions.  A negative result may occur with improper specimen collection / handling, submission of specimen other than nasopharyngeal swab, presence of viral  mutation(s) within the areas targeted by this assay, and inadequate number of viral copies (<250 copies / mL). A negative result must be combined with clinical observations, patient history, and epidemiological information.  Fact Sheet for Patients:   RoadLapTop.co.za  Fact Sheet for Healthcare Providers: http://kim-miller.com/  This test is not yet approved or  cleared by the [place] FDA and has been authorized for detection and/or diagnosis of SARS-CoV-2 by FDA under an Emergency Use Authorization (EUA).  This EUA will remain in effect (meaning this  test can be used) for the duration of the COVID-19 declaration under Section 564(b)(1) of the Act, 21 U.S.C. section 360bbb-3(b)(1), unless the authorization is terminated or revoked sooner.  Performed at [facility], [address] [place], [place] [address]   Resp panel by RT-PCR (RSV, Flu A&B, Covid) Anterior Nasal Swab     Status: None   Collection Time: 04/13/23  4:43 PM   Specimen: Anterior Nasal Swab  Result Value Ref Range Status   SARS Coronavirus 2 by RT PCR NEGATIVE NEGATIVE Final    Comment: (NOTE) SARS-CoV-2 target nucleic acids are NOT DETECTED.  The SARS-CoV-2 RNA is generally detectable in upper respiratory specimens during the acute phase of infection. The lowest concentration of SARS-CoV-2 viral copies this assay can detect is 138 copies/mL. A negative result does not preclude SARS-Cov-2 infection and should not be used as the sole basis for treatment or other patient management decisions. A negative result may occur with  improper specimen collection/handling, submission of specimen other than nasopharyngeal swab, presence of viral mutation(s) within the areas targeted by this assay, and inadequate number of viral copies(<138 copies/mL). A negative result must be combined with clinical observations, patient history, and epidemiological information. The expected  result is Negative.  Fact Sheet for Patients:  BloggerCourse.com  Fact Sheet for Healthcare Providers:  SeriousBroker.it  This test is no t yet approved or cleared by the [place] FDA and  has been authorized for detection and/or diagnosis of SARS-CoV-2 by FDA under an Emergency Use Authorization (EUA). This EUA will remain  in effect (meaning this test can be used) for the duration of the COVID-19 declaration under Section 564(b)(1) of the Act, 21 U.S.C.section 360bbb-3(b)(1), unless the authorization is terminated  or revoked sooner.       Influenza A by PCR NEGATIVE NEGATIVE Final   Influenza B by PCR NEGATIVE NEGATIVE Final    Comment: (NOTE) The Xpert Xpress SARS-CoV-2/FLU/RSV plus assay is intended as an aid in the diagnosis of influenza from Nasopharyngeal swab specimens and should not be used as a sole basis for treatment. Nasal washings and aspirates are unacceptable for Xpert Xpress SARS-CoV-2/FLU/RSV testing.  Fact Sheet for Patients: BloggerCourse.com  Fact Sheet for Healthcare Providers: SeriousBroker.it  This test is not yet approved or cleared by the [place] FDA and has been authorized for detection and/or diagnosis of SARS-CoV-2 by FDA under an Emergency Use Authorization (EUA). This EUA will remain in effect (meaning this test can be used) for the duration of the COVID-19 declaration under Section 564(b)(1) of the Act, 21 U.S.C. section 360bbb-3(b)(1), unless the authorization is terminated or revoked.     Resp Syncytial Virus by PCR NEGATIVE NEGATIVE Final    Comment: (NOTE) Fact Sheet for Patients: BloggerCourse.com  Fact Sheet for Healthcare Providers: SeriousBroker.it  This test is not yet approved or cleared by the [place] FDA and has been authorized for detection and/or diagnosis of  SARS-CoV-2 by FDA under an Emergency Use Authorization (EUA). This EUA will remain in effect (meaning this test can be used) for the duration of the COVID-19 declaration under Section 564(b)(1) of the Act, 21 U.S.C. section 360bbb-3(b)(1), unless the authorization is terminated or revoked.  Performed at [facility], [address], [place], [place] [address]   Culture, blood (routine x 2) Call MD if unable to obtain prior to antibiotics being given     Status: None (Preliminary result)   Collection Time: 04/13/23  4:45 PM   Specimen: BLOOD LEFT HAND  Result Value Ref Range Status   Specimen Description BLOOD LEFT HAND  Final   Special Requests   Final    BOTTLES DRAWN AEROBIC AND ANAEROBIC Blood Culture adequate volume   Culture   Final    NO GROWTH 2 DAYS Performed at [facility], [address], [place], [place] [address]    Report Status PENDING  Incomplete  Culture, blood (routine x 2) Call MD if unable to obtain prior to antibiotics being given     Status: None (Preliminary result)   Collection Time: 04/13/23 10:17 PM   Specimen: BLOOD LEFT HAND  Result Value Ref Range Status   Specimen Description   Final    BLOOD LEFT HAND Performed at [facility], [address] [place], [place] [address]    Special Requests   Final    BOTTLES DRAWN AEROBIC AND ANAEROBIC Blood Culture results may not be optimal due to an excessive volume of blood received in culture bottles   Culture   Final    NO GROWTH 2 DAYS Performed at [facility], [address], [place], [place] [address]    Report Status PENDING  Incomplete  MRSA Next Gen by PCR, Nasal     Status: None   Collection Time: 04/15/23  9:52 AM   Specimen: Nasal Mucosa; Nasal Swab  Result Value Ref Range Status   MRSA by PCR Next Gen NOT DETECTED NOT DETECTED Final    Comment: (NOTE) The GeneXpert MRSA Assay (FDA approved for NASAL specimens only), is one component of a comprehensive MRSA colonization  surveillance program. It is not intended to diagnose MRSA infection nor to guide or monitor treatment for MRSA infections. Test performance is not FDA approved in patients less than 54 years old. Performed at [facility], [address], [place], [place] [address]       Studies: ECHOCARDIOGRAM COMPLETE  Result Date: 04/14/2023    ECHOCARDIOGRAM REPORT   Patient Name:   FRANTZ CIARDI Date of Exam: 04/14/2023 Medical Rec #:  [id number]        Height:       74.0 in Accession #:    [id number]       Weight:       242.9 lb Date of Birth:  06-26-1946        BSA:          2.361 m Patient Age:    77 years         BP:           117/57 mmHg Patient Gender: M                HR:           85 bpm. [facility] Procedure: 2D Echo, Cardiac Doppler, Color Doppler and Intracardiac            Opacification Agent Indications:    CHF-Acute Systolic I50.21  History:        Patient has no prior history of Echocardiogram examinations.                 Prior CABG, COPD, Arrythmias:RBBB; Risk Factors:Hypertension and                 Dyslipidemia.  Sonographer:    Celesta Gentile RCS Referring Phys: [id number] OLADAPO ADEFESO IMPRESSIONS  1. Left ventricular ejection fraction, by estimation, is 70 to 75%. The left ventricle has hyperdynamic function. The left ventricle has no regional wall  motion abnormalities. Left ventricular diastolic parameters are consistent with Grade I diastolic dysfunction (impaired relaxation).  2. Right ventricular systolic function is normal. The right ventricular size is normal. Tricuspid regurgitation signal is inadequate for assessing PA pressure.  3. The mitral valve is normal in structure. No evidence of mitral valve regurgitation. No evidence of mitral stenosis.  4. The aortic valve has an indeterminant number of cusps. Aortic valve regurgitation is not visualized. No aortic stenosis is present. FINDINGS  Left Ventricle: Left ventricular ejection fraction, by estimation, is 70 to 75%. The  left ventricle has hyperdynamic function. The left ventricle has no regional wall motion abnormalities. Definity contrast agent was given IV to delineate the left ventricular endocardial borders. The left ventricular internal cavity size was normal in size. There is no left ventricular hypertrophy. Left ventricular diastolic parameters are consistent with Grade I diastolic dysfunction (impaired relaxation). Normal  left ventricular filling pressure. Right Ventricle: The right ventricular size is normal. Right vetricular wall thickness was not well visualized. Right ventricular systolic function is normal. Tricuspid regurgitation signal is inadequate for assessing PA pressure. Left Atrium: Left atrial size was normal in size. Right Atrium: Right atrial size was normal in size. Pericardium: There is no evidence of pericardial effusion. Mitral Valve: The mitral valve is normal in structure. No evidence of mitral valve regurgitation. No evidence of mitral valve stenosis. Tricuspid Valve: The tricuspid valve is normal in structure. Tricuspid valve regurgitation is not demonstrated. No evidence of tricuspid stenosis. Aortic Valve: The aortic valve has an indeterminant number of cusps. Aortic valve regurgitation is not visualized. No aortic stenosis is present. Aortic valve mean gradient measures 6.0 mmHg. Aortic valve peak gradient measures 12.5 mmHg. Aortic valve area, by VTI measures 2.71 cm. Pulmonic Valve: The pulmonic valve was not well visualized. Pulmonic valve regurgitation is not visualized. No evidence of pulmonic stenosis. Aorta: The aortic root is normal in size and structure. Venous: The inferior vena cava was not well visualized. IAS/Shunts: The interatrial septum was not well visualized.  LEFT VENTRICLE PLAX 2D LVIDd:         5.00 cm   Diastology LVIDs:         2.80 cm   LV e' medial:    5.55 cm/s LV PW:         1.10 cm   LV E/e' medial:  11.8 LV IVS:        1.00 cm   LV e' lateral:   10.00 cm/s LVOT diam:      2.00 cm   LV E/e' lateral: 6.6 LV SV:         89 LV SV Index:   38 LVOT Area:     3.14 cm  RIGHT VENTRICLE RV S prime:     15.40 cm/s TAPSE (M-mode): 1.4 cm LEFT ATRIUM           Index        RIGHT ATRIUM           Index LA diam:      4.35 cm 1.84 cm/m   RA Area:     17.90 cm LA Vol (A2C): 47.0 ml 19.91 ml/m  RA Volume:   47.00 ml  19.91 ml/m LA Vol (A4C): 69.9 ml 29.61 ml/m  AORTIC VALVE AV Area (Vmax):    2.81 cm AV Area (Vmean):   2.78 cm AV Area (VTI):     2.71 cm AV Vmax:           176.77  cm/s AV Vmean:          113.616 cm/s AV VTI:            0.328 m AV Peak Grad:      12.5 mmHg AV Mean Grad:      6.0 mmHg LVOT Vmax:         158.00 cm/s LVOT Vmean:        100.367 cm/s LVOT VTI:          0.283 m LVOT/AV VTI ratio: 0.86  AORTA Ao Root diam: 3.20 cm MITRAL VALVE MV Area (PHT): 1.46 cm    SHUNTS MV Decel Time: 521 msec    Systemic VTI:  0.28 m MV E velocity: 65.60 cm/s  Systemic Diam: 2.00 cm MV A velocity: 87.80 cm/s MV E/A ratio:  0.75 Dina Rich MD Electronically signed by Dina Rich MD Signature Date/Time: 04/14/2023/3:53:35 PM    Final     Scheduled Meds:  Chlorhexidine Gluconate Cloth  6 each Topical Daily   dextromethorphan-guaiFENesin  1 tablet Oral BID   enoxaparin (LOVENOX) injection  110 mg Subcutaneous BID   feeding supplement  1 Container Oral QHS   feeding supplement  237 mL Oral BID BM   multivitamin with minerals  1 tablet Oral Q lunch   QUEtiapine  25 mg Oral QHS    Continuous Infusions:  ceFEPime (MAXIPIME) IV 2 g (04/15/23 0645)   vancomycin 1,000 mg (04/14/23 1708)     LOS: 2 days     Darlin Drop, MD [facility] Pager [phone]  If 7PM-7AM, please contact night-coverage [email or web address] Password [email or web address] 04/15/2023, 1:44 PM

## 2023-04-15 NOTE — Progress Notes (Signed)
Initial Nutrition Assessment  DOCUMENTATION CODES:   Non-severe (moderate) malnutrition in context of chronic illness, Obesity unspecified  INTERVENTION:   -Ensure Enlive po BID, each supplement provides 350 kcal and 20 grams of protein.  -Boost Breeze po daily, each supplement provides 250 kcal and 9 grams of protein  -Magic cup TID with meals, each supplement provides 290 kcal and 9 grams of protein  -MVI with minerals daily -Liberalize diet to to regular diet for widest variety of meal selections  NUTRITION DIAGNOSIS:   Moderate Malnutrition related to chronic illness (sarcoma) as evidenced by mild fat depletion, moderate fat depletion, mild muscle depletion, moderate muscle depletion, percent weight loss.  GOAL:   Patient will meet greater than or equal to 90% of their needs  MONITOR:   PO intake, Supplement acceptance  REASON FOR ASSESSMENT:   Consult Assessment of nutrition requirement/status  ASSESSMENT:   Pt with medical history significant of hypertension, hyperlipidemia, OSA on CPAP, CAD s/p CABG (97), obesity, sarcoma who presents from SNF due to increased work of breathing while on oxygen.  Pt admitted with acute respiratory failure with hypoxia secondary to recurrent pneumonia.   Reviewed I/O's: +624 ml x 24 hours and +974 ml since admission  UOP: 700 ml x 24 hours  Per MD notes, imagining reveals worsening bibasilar infiltrates with new small right pleural effusion. Pt with recent prior hospitalization for rt renal mass concerning for RCC with suspicion for pulmonary metastasis.   Spoke with pt and wife at bedside. Pt deferred to wife for majority of interview. Wife shares that pt was discharged after recent hospitalization to SNF and was brought back to ED within 24 hours due to respiratory distress. Intake has been poor over the past 3 weeks; pt is not interested in eating due to poor appetite and complains of poor taste. Pt has been eating only bites at  meals. He consumed a few bites of breakfast and sip of Ensure today.Wife reports favorite foods are not appealing to him; daughter brought in pizza for pt last night and he refused to eat it.   Prior to last hospitalization, pt with very good appetite. Wife states at baseline he has a very hearty appetite and is a "meat and potatoes kind of guy". Pt has been on Nut[employer]ystem and lost about 30# over the past several years, but discontinued this about 5 months ago due to being tired of the pre-made meal products.  Pt's UBW is around 255#. Wife endorses wt loss, but unsure how much. Reviewed wt hx; pt has experienced a 8.5% wt loss over the past month, which is significant for time frame.   Discussed importance of good meal and supplement intake to promote healing. Will also liberalize diet to provide widest variety of meal selections. Noted pt is currently on a heart healthy diet; meal completions 0%. Pt consumed only a few bites of lunch.   Medications reviewed and include mucinex and lovenox.   Labs reviewed.   NUTRITION - FOCUSED PHYSICAL EXAM:  Flowsheet Row Most Recent Value  Orbital Region Moderate depletion  Upper Arm Region Mild depletion  Thoracic and Lumbar Region No depletion  Buccal Region Moderate depletion  Temple Region Moderate depletion  Clavicle Bone Region No depletion  Clavicle and Acromion Bone Region No depletion  Scapular Bone Region No depletion  Dorsal Hand Mild depletion  Patellar Region No depletion  Anterior Thigh Region No depletion  Posterior Calf Region Mild depletion  Edema (RD Assessment) Mild  Hair Reviewed  Eyes Reviewed  Mouth Reviewed  Skin Reviewed  Nails Reviewed        Diet Order:   Diet Order             Diet regular Room service appropriate? Yes; Fluid consistency: Thin  Diet effective now                   EDUCATION NEEDS:   Education needs have been addressed  Skin:  Skin Assessment: Skin Integrity Issues: Skin Integrity  Issues:: Incisions Incisions: closed rt upper abdomen  Last BM:  04/15/23 (type 6)  Height:   Ht Readings from Last 1 Encounters:  04/13/23 6\' 2"  (1.88 m)    Weight:   Wt Readings from Last 1 Encounters:  04/15/23 106.6 kg    Ideal Body Weight:  86.4 kg  BMI:  Body mass index is 30.17 kg/m.  Estimated Nutritional Needs:   Kcal:  2400-2600  Protein:  115-130 grams  Fluid:  > 2 L    Levada Schilling, RD, LDN, CDCES Registered Dietitian II Certified Diabetes Care and Education Specialist Please refer to [facility] for RD and/or RD on-call/weekend/after hours pager

## 2023-04-15 NOTE — Plan of Care (Signed)
  Problem: Respiratory: Goal: Ability to maintain a clear airway will improve Outcome: Progressing   Problem: Respiratory: Goal: Ability to maintain adequate ventilation will improve Outcome: Progressing   Problem: Clinical Measurements: Goal: Respiratory complications will improve Outcome: Progressing

## 2023-04-15 NOTE — Progress Notes (Signed)
Pt. Up to [facility] with 2 person assist and walker. Pt. Had very large BM. Assisted pt. To chair. Pt. Now resting comfortably in chair.

## 2023-04-16 ENCOUNTER — Inpatient Hospital Stay (HOSPITAL_COMMUNITY): Payer: Medicare Other

## 2023-04-16 ENCOUNTER — Encounter (HOSPITAL_COMMUNITY): Payer: Self-pay | Admitting: Internal Medicine

## 2023-04-16 ENCOUNTER — Other Ambulatory Visit (HOSPITAL_COMMUNITY): Payer: Self-pay

## 2023-04-16 DIAGNOSIS — N1832 Chronic kidney disease, stage 3b: Secondary | ICD-10-CM | POA: Diagnosis not present

## 2023-04-16 DIAGNOSIS — J432 Centrilobular emphysema: Secondary | ICD-10-CM

## 2023-04-16 DIAGNOSIS — I2699 Other pulmonary embolism without acute cor pulmonale: Secondary | ICD-10-CM

## 2023-04-16 DIAGNOSIS — E872 Acidosis, unspecified: Secondary | ICD-10-CM | POA: Diagnosis not present

## 2023-04-16 DIAGNOSIS — J9601 Acute respiratory failure with hypoxia: Secondary | ICD-10-CM | POA: Diagnosis not present

## 2023-04-16 DIAGNOSIS — E440 Moderate protein-calorie malnutrition: Secondary | ICD-10-CM | POA: Insufficient documentation

## 2023-04-16 LAB — MAGNESIUM: Magnesium: 2.1 mg/dL (ref 1.7–2.4)

## 2023-04-16 LAB — BASIC METABOLIC PANEL
Anion gap: 10 (ref 5–15)
Anion gap: 4 — ABNORMAL LOW (ref 5–15)
BUN: 28 mg/dL — ABNORMAL HIGH (ref 8–23)
BUN: 27 mg/dL — ABNORMAL HIGH (ref 8–23)
CO2: 22 mmol/L (ref 22–32)
CO2: 27 mmol/L (ref 22–32)
Calcium: 9.6 mg/dL (ref 8.9–10.3)
Calcium: 9.5 mg/dL (ref 8.9–10.3)
Chloride: 108 mmol/L (ref 98–111)
Chloride: 108 mmol/L (ref 98–111)
Creatinine, Ser: 1.59 mg/dL — ABNORMAL HIGH (ref 0.61–1.24)
Creatinine, Ser: 1.64 mg/dL — ABNORMAL HIGH (ref 0.61–1.24)
GFR, Estimated: 45 mL/min — ABNORMAL LOW (ref >60)
GFR, Estimated: 43 mL/min — ABNORMAL LOW (ref >60)
Glucose, Bld: 46 mg/dL — ABNORMAL LOW (ref 70–99)
Glucose, Bld: 117 mg/dL — ABNORMAL HIGH (ref 70–99)
Potassium: 3 mmol/L — ABNORMAL LOW (ref 3.5–5.1)
Potassium: 3.8 mmol/L (ref 3.5–5.1)
Sodium: 140 mmol/L (ref 135–145)
Sodium: 139 mmol/L (ref 135–145)

## 2023-04-16 LAB — CBC
HCT: 34 % — ABNORMAL LOW (ref 39.0–52.0)
Hemoglobin: 10.2 g/dL — ABNORMAL LOW (ref 13.0–17.0)
MCH: 26.4 pg (ref 26.0–34.0)
MCHC: 30 g/dL (ref 30.0–36.0)
MCV: 88.1 fL (ref 80.0–100.0)
Platelets: 207 10*3/uL (ref 150–400)
RBC: 3.86 MIL/uL — ABNORMAL LOW (ref 4.22–5.81)
RDW: 17 % — ABNORMAL HIGH (ref 11.5–15.5)
WBC: 102 10*3/uL (ref 4.0–10.5)
nRBC: 0 % (ref 0.0–0.2)

## 2023-04-16 LAB — GLUCOSE, CAPILLARY: Glucose-Capillary: 114 mg/dL — ABNORMAL HIGH (ref 70–99)

## 2023-04-16 MED ORDER — METOPROLOL TARTRATE 5 MG/5ML IV SOLN
5.0000 mg | Freq: Four times a day (QID) | INTRAVENOUS | Status: DC
  Administered 2023-04-16 – 2023-04-17 (×3): 5 mg via INTRAVENOUS
  Filled 2023-04-16 (×2): qty 5

## 2023-04-16 MED ORDER — HALOPERIDOL LACTATE 5 MG/ML IJ SOLN
2.0000 mg | Freq: Four times a day (QID) | INTRAMUSCULAR | Status: DC | PRN
  Administered 2023-04-16 – 2023-04-18 (×6): 2 mg via INTRAVENOUS
  Filled 2023-04-16 (×6): qty 1

## 2023-04-16 MED ORDER — ADULT MULTIVITAMIN W/MINERALS CH: 1.0000 | ORAL_TABLET | Freq: Every day | ORAL | Status: DC

## 2023-04-16 MED ORDER — TECHNETIUM TO 99M ALBUMIN AGGREGATED
4.0000 | Freq: Once | INTRAVENOUS | Status: AC | PRN
  Administered 2023-04-16: 4.3 via INTRAVENOUS

## 2023-04-16 MED ORDER — POTASSIUM CHLORIDE 10 MEQ/100ML IV SOLN
10.0000 meq | INTRAVENOUS | Status: DC
  Administered 2023-04-16: 10 meq via INTRAVENOUS
  Filled 2023-04-16: qty 100

## 2023-04-16 MED ORDER — ROSUVASTATIN CALCIUM 20 MG PO TABS
20.0000 mg | ORAL_TABLET | Freq: Every evening | ORAL | Status: DC
  Administered 2023-04-16 – 2023-04-18 (×3): 20 mg via ORAL
  Filled 2023-04-16 (×3): qty 1

## 2023-04-16 MED ORDER — CALCIUM CITRATE-VITAMIN D 250-100 MG-UNIT PO TABS: 1.0000 | ORAL_TABLET | Freq: Two times a day (BID) | ORAL | Status: DC

## 2023-04-16 MED ORDER — METOPROLOL TARTRATE 5 MG/5ML IV SOLN
INTRAVENOUS | Status: AC
  Filled 2023-04-16: qty 5

## 2023-04-16 MED ORDER — APIXABAN 5 MG PO TABS: 5.0000 mg | ORAL_TABLET | Freq: Two times a day (BID) | ORAL | Status: DC

## 2023-04-16 MED ORDER — SPIRONOLACTONE 25 MG PO TABS: 25.0000 mg | ORAL_TABLET | Freq: Every day | ORAL | Status: DC

## 2023-04-16 MED ORDER — APIXABAN 5 MG PO TABS
10.0000 mg | ORAL_TABLET | Freq: Two times a day (BID) | ORAL | Status: DC
  Administered 2023-04-17 – 2023-04-18 (×4): 10 mg via ORAL
  Filled 2023-04-16 (×4): qty 2

## 2023-04-16 MED ORDER — QUETIAPINE FUMARATE 25 MG PO TABS
25.0000 mg | ORAL_TABLET | Freq: Every day | ORAL | Status: DC
  Administered 2023-04-16 – 2023-04-18 (×3): 25 mg via ORAL
  Filled 2023-04-16 (×3): qty 1

## 2023-04-16 MED ORDER — OYSTER SHELL CALCIUM/D3 500-5 MG-MCG PO TABS
1.0000 | ORAL_TABLET | Freq: Two times a day (BID) | ORAL | Status: DC
  Administered 2023-04-16 – 2023-04-18 (×5): 1 via ORAL
  Filled 2023-04-16 (×5): qty 1

## 2023-04-16 MED ORDER — ENOXAPARIN SODIUM 100 MG/ML IJ SOSY
100.0000 mg | PREFILLED_SYRINGE | Freq: Two times a day (BID) | INTRAMUSCULAR | Status: AC
  Administered 2023-04-16: 100 mg via SUBCUTANEOUS
  Filled 2023-04-16: qty 1

## 2023-04-16 MED ORDER — METOPROLOL TARTRATE 5 MG/5ML IV SOLN
2.5000 mg | Freq: Four times a day (QID) | INTRAVENOUS | Status: DC
  Administered 2023-04-16: 2.5 mg via INTRAVENOUS
  Filled 2023-04-16: qty 5

## 2023-04-16 NOTE — TOC Progression Note (Signed)
Transition of Care [facility]) - Progression Note    Patient Details  Name: Joshua Gentry MRN: [id number] Date of Birth: 12-28-1945  Transition of Care [facility]) CM/SW Contact  Elliot Gault, LCSW Phone Number: 04/16/2023, 10:11 AM  Clinical Narrative:     TOC following. MD anticipating dc in 2-3 days (Sunday/Monday). Updated Kerri at [facility] who states that they can admit pt either day. Weekend/holiday TOC will follow and update RN with dc info when pt medically stable for dc.  Expected Discharge Plan: Skilled Nursing Facility Barriers to Discharge: Continued Medical Work up  Expected Discharge Plan and Services In-house Referral: Clinical Social Work   Post Acute Care Choice: Skilled Nursing Facility                                         Social Determinants of Health (SDOH) Interventions SDOH Screenings   Food Insecurity: No Food Insecurity (03/22/2023)  Housing: Low Risk  (03/22/2023)  Transportation Needs: No Transportation Needs (03/22/2023)  Utilities: Not At Risk (03/22/2023)  Depression (PHQ2-9): Low Risk  (03/22/2023)  Financial Resource Strain: Low Risk  (03/22/2023)  Tobacco Use: Medium Risk (04/04/2023)    Readmission Risk Interventions    04/14/2023    8:59 AM  Readmission Risk Prevention Plan  Transportation Screening Complete  HRI or Home Care Consult Complete  Social Work Consult for Recovery Care Planning/Counseling Complete  Palliative Care Screening Not Applicable  Medication Review Oceanographer) Complete

## 2023-04-16 NOTE — Hospital Course (Signed)
77 y.o. male with medical history significant of hypertension, hyperlipidemia, OSA on CPAP, CAD s/p CABG (97), obesity, multiple lung nodules, recently diagnosed with right renal mass, liposarcoma who presents to [facility] emergency department from SNF due to increased work of breathing.  He was recently hospitalized from 7/30-8/1 due to having gross hematuria, later noted to be due to right renal mass suspicious for RCC.  Urology was consulted at that time and elective surgery was planned.  On 8/19, patient underwent right robotic assisted laparoscopic radical nephrectomy.  He was intubated for the procedure under general anesthesia.  Patient developed hypoxia postop and required NRB, TRH was consulted and patient was noted to have acute respiratory failure with hypoxia due to pneumonia.  He was treated with 5 days of IV steroids and IV antibiotics, IV Rocephin and azithromycin and discharged to SNF on 04/12/23.  Plan at discharge was to repeat a chest x-ray in 4 weeks to ensure resolution of pneumonia findings.     The patient returned to the ED the next day and was admitted for recurrent pneumonia and was started on IV vancomycin and cefepime.  Due to concern for possible PE heparin drip was initiated in the ED.  Heparin drip was switched to full dose subcu Lovenox twice daily on 04/14/2023.  Pt found to have RUL pulmonary emboli.  He developed sinus pauses and subsequently developed Afib RVR.  He is transferring to [facility] on 9/1 for cardiology consultation.

## 2023-04-16 NOTE — Progress Notes (Signed)
Patient unable to tolerate CPAP.  He continues to pull of mask and not wear it.

## 2023-04-16 NOTE — Progress Notes (Signed)
[facility] Radiology called report on patient's VQ Scan findings. Dr. Laural Benes made aware.

## 2023-04-16 NOTE — Plan of Care (Signed)
  Problem: Acute Rehab PT Goals(only PT should resolve) Goal: Pt Will Go Supine/Side To Sit Outcome: Progressing Flowsheets (Taken 04/16/2023 1459) Pt will go Supine/Side to Sit: with contact guard assist Goal: Patient Will Transfer Sit To/From Stand Outcome: Progressing Flowsheets (Taken 04/16/2023 1459) Patient will transfer sit to/from stand: with contact guard assist Goal: Pt Will Transfer Bed To Chair/Chair To Bed Outcome: Progressing Flowsheets (Taken 04/16/2023 1459) Pt will Transfer Bed to Chair/Chair to Bed: with contact guard assist Goal: Pt Will Ambulate Outcome: Progressing Flowsheets (Taken 04/16/2023 1459) Pt will Ambulate:  50 feet  with rolling walker  with contact guard assist

## 2023-04-16 NOTE — Evaluation (Signed)
Physical Therapy Evaluation Patient Details Name: Joshua Gentry MRN: [id number] DOB: [birth year] Today's Date: 04/16/2023  History of Present Illness  Joshua Gentry is a 77 y.o. male with medical history significant of hypertension, hyperlipidemia, OSA on CPAP, CAD s/p CABG (97), obesity, sarcoma who presents to the emergency department to the emergency department from SNF due to increased work of breathing while on oxygen.  He was noted to have increased dependency on oxygen at the nursing facility today, so EMS was activated and patient was sent to the ED for further evaluation and management.  DuoNeb, Solu-Medrol, magnesium was given en route.  He was recently hospitalized from 730-8/1 due to having gross hematuria which was later on noted to be due to right renal mass that was concerning for RCC with suspicion for pulmonary metastasis.  Urology was consulted at that time and elective surgery was planned.  On 8/19, patient underwent right robotic assisted laparoscopic radical nephrectomy.  He was intubated for the procedure under general anesthesia.  Patient developed hypoxia postop and required NRB, TRH was consulted and patient was noted to have acute respiratory failure with hypoxia due to pneumonia.  He also developed acute metabolic encephalopathy.  Patient was discharged to SNF yesterday (9/26)    Clinical Impression  Patient sitting up in chair on therapist arrival.  Initially refuses to stand up with PT but then is agreeable to get back into bed. Min a for sit to stand to RW due to leg and general weakness. He is able to stand pivot to bed.  Once sitting on the edge of the bed he leans on the walker due to fatigue.  He  then states he needs to have a bowel movement so he performs sit to stand with min A from PT and ambulates to toilet.  Needs assist with controlled descent to sitting.  After toileting; patient needs min to mod A to stand and return to bed; needs assist to put his legs back  into bed .  Patient with noted heart arrhythmia at the end of treatment and O2 saturation levels varying between 75 and 90% required frequent rest breaks during treatment to focus on breathing.  Nursing present at the end of treatment.  patient left in bed with call button in reach, wife and nurse in room and nursing notified of mobility status. Patient will benefit from continued skilled therapy services during the remainder of his hospital stay and at the next recommended venue of care to address deficits and promote return to optimal function.           If plan is discharge home, recommend the following: A lot of help with walking and/or transfers;A lot of help with bathing/dressing/bathroom;Assist for transportation;Assistance with cooking/housework   Can travel by private vehicle   Yes    Equipment Recommendations None recommended by PT  Recommendations for Other Services       Functional Status Assessment Patient has had a recent decline in their functional status and demonstrates the ability to make significant improvements in function in a reasonable and predictable amount of time.     Precautions / Restrictions Precautions Precautions: Fall Restrictions Weight Bearing Restrictions: No      Mobility  Bed Mobility Overal bed mobility: Needs Assistance Bed Mobility: Supine to Sit Rolling:  (HHA) Sidelying to sit:  (HHA) Supine to sit: Min assist     General bed mobility comments: increased time, labored movement Patient Response: Flat affect, Cooperative  Transfers Overall transfer level:  Needs assistance Equipment used: Rolling walker (2 wheels) Transfers: Sit to/from Stand, Bed to chair/wheelchair/BSC Sit to Stand: Min assist   Step pivot transfers: Min assist       General transfer comment: unsteady labored movement    Ambulation/Gait Ambulation/Gait assistance: Min assist Gait Distance (Feet): 8 Feet Assistive device: Rolling walker (2 wheels) Gait  Pattern/deviations: Decreased step length - right, Decreased stride length, Decreased step length - left Gait velocity: decreased     General Gait Details: ambulates from bed to toilet and back with RW and min A; forwarded flexed trunk and slow labored movement  Stairs            Wheelchair Mobility     Tilt Bed Tilt Bed Patient Response: Flat affect, Cooperative  Modified Rankin (Stroke Patients Only)       Balance Overall balance assessment: Needs assistance Sitting-balance support: Feet supported, No upper extremity supported Sitting balance-Leahy Scale: Good Sitting balance - Comments: seated at EOB   Standing balance support: Reliant on assistive device for balance, During functional activity, Bilateral upper extremity supported Standing balance-Leahy Scale: Fair Standing balance comment: using RW                             Pertinent Vitals/Pain Pain Assessment Pain Assessment: No/denies pain    Home Living Family/patient expects to be discharged to:: Skilled nursing facility Living Arrangements: Spouse/significant other Available Help at Discharge: Available 24 hours/day;Family Type of Home: House Home Access: Stairs to enter Entrance Stairs-Rails: Left;Right;Can reach both Entrance Stairs-Number of Steps: 5-6   Home Layout: One level Home Equipment: Agricultural consultant (2 wheels);Grab bars - toilet Additional Comments: Pt wife reports that the RW they were borrowing had to be returned so he will now need one after d/c from SNF    Prior Function Prior Level of Function : Independent/Modified Independent               ADLs Comments: Pt reports that prior to first hospitalization he was independent with bathing and dressing     Extremity/Trunk Assessment   Upper Extremity Assessment Upper Extremity Assessment: Defer to OT evaluation    Lower Extremity Assessment Lower Extremity Assessment: Generalized weakness    Cervical / Trunk  Assessment Cervical / Trunk Assessment: Normal  Communication   Communication Communication: No apparent difficulties Cueing Techniques: Verbal cues  Cognition Arousal: Alert Behavior During Therapy: WFL for tasks assessed/performed Overall Cognitive Status: Within Functional Limits for tasks assessed (difficulty following directions at times)                         Following Commands: Follows one step commands consistently       General Comments: appears to have some mild confusion per wife but able to follow commands and answers therapist questions appropriately        General Comments      Exercises     Assessment/Plan    PT Assessment Patient needs continued PT services  PT Problem List Decreased strength;Decreased activity tolerance;Decreased balance;Decreased mobility       PT Treatment Interventions DME instruction;Gait training;Stair training;Functional mobility training;Therapeutic activities;Therapeutic exercise;Balance training    PT Goals (Current goals can be found in the Care Plan section)  Acute Rehab PT Goals Patient Stated Goal: rturn home after rehab PT Goal Formulation: With patient/family Time For Goal Achievement: 04/30/23 Potential to Achieve Goals: Good    Frequency Min 3X/week  Co-evaluation               AM-PAC PT "6 Clicks" Mobility  Outcome Measure Help needed turning from your back to your side while in a flat bed without using bedrails?: A Little Help needed moving from lying on your back to sitting on the side of a flat bed without using bedrails?: A Little Help needed moving to and from a bed to a chair (including a wheelchair)?: A Little Help needed standing up from a chair using your arms (e.g., wheelchair or bedside chair)?: A Lot Help needed to walk in hospital room?: A Lot Help needed climbing 3-5 steps with a railing? : A Lot 6 Click Score: 15    End of Session Equipment Utilized During Treatment:  Oxygen Activity Tolerance: Patient tolerated treatment well;Patient limited by fatigue Patient left: with call bell/phone within reach;with family/visitor present;in bed;with nursing/sitter in room Nurse Communication: Mobility status PT Visit Diagnosis: Unsteadiness on feet (R26.81);Other abnormalities of gait and mobility (R26.89);Muscle weakness (generalized) (M62.81)    Time: 1400-1420 PT Time Calculation (min) (ACUTE ONLY): 20 min   Charges:   PT Evaluation $PT Eval Low Complexity: 1 Low   PT General Charges $$ ACUTE PT VISIT: 1 Visit         2:59 PM, 04/16/23 Amin Fornwalt Small Faaris Arizpe MPT [facility]  [phone] [phone]

## 2023-04-16 NOTE — Progress Notes (Signed)
ANTICOAGULATION CONSULT NOTE - Follow Up Consult  Pharmacy Consult for Lovenox>>apixaban Indication: Pulmonary embolus  Allergies  Allergen Reactions   Fire Ant Anaphylaxis   Niacin    Penicillins     Patient can't recall reaction- happened a long time ago   Tape Hives   Wound Dressing Adhesive Rash    Patient Measurements: Height: 6\' 2"  (188 cm) Weight: 98.1 kg (216 lb 4.3 oz) IBW/kg (Calculated) : 82.2 Heparin Dosing Weight: 105 kg  Vital Signs: Temp: 97.4 F (36.3 C) (08/30 1200) Temp Source: Axillary (08/30 1200) BP: 125/76 (08/30 1200) Pulse Rate: 106 (08/30 1200)  Labs: Recent Labs    04/14/23 0552 04/14/23 1702 04/15/23 0403 04/16/23 0324 04/16/23 0749  HGB 10.2* 10.0* 10.6* 10.2*  --   HCT 33.3* 33.2* 34.2* 34.0*  --   PLT 210 206 220 207  --   HEPARINUNFRC 0.18*  --   --   --   --   CREATININE 1.83*  --   --  1.59* 1.64*    Estimated Creatinine Clearance: 45.2 mL/min (A) (by C-G formula based on SCr of 1.64 mg/dL (H)).   Assessment: Patient currently on sq lovenox for rule out PE.  Patient now s/p VQ scan with wedge shaped defects in the RUL consistent with pulmonary emboli.   Orders to transition to apixaban tomorrow morning.   Goal of Therapy:  Monitor platelets by anticoagulation protocol: Yes   Plan:  Lovenox 100mg  tonight  Apixaban 10mg  bid x 7 days then 5mg  bid starting 8/31   Sheppard Coil PharmD., BCPS Clinical Pharmacist 04/16/2023 4:05 PM

## 2023-04-16 NOTE — TOC Benefit Eligibility Note (Signed)
Patient Product/process development scientist completed.    The patient is insured through [employer]. Patient has Medicare and is not eligible for a copay card, but may be able to apply for patient assistance, if available.    Ran test claim for Eliquis 5 mg and the current 30 day co-pay is $387.33 due to a deductible.   This test claim was processed through [facility]- copay amounts may vary at other pharmacies due to pharmacy/plan contracts, or as the patient moves through the different stages of their insurance plan.     Roland Earl, CPHT Pharmacy Technician III Certified Patient Advocate Safety [facility] Pharmacy Patient Advocate Team Direct Number: [phone]  Fax: [phone]

## 2023-04-16 NOTE — Progress Notes (Signed)
Patient HR went upto 200, monitor showed Vtach, was not sustaining, did 12 leads EKG, showed Afib with RVR, MD made aware, gave scheduled Metoprolol, BP 130/61, after the Metoprolol , HR went back to 90-110, still irregular, awaiting for more orders. Will continue to monitor.

## 2023-04-16 NOTE — Evaluation (Signed)
Occupational Therapy Evaluation Patient Details Name: Joshua Gentry MRN: [id number] DOB: 1945/10/08 Today's Date: 04/16/2023   History of Present Illness Joshua Gentry is a 77 y.o. male with medical history significant of hypertension, hyperlipidemia, OSA on CPAP, CAD s/p CABG (97), obesity, sarcoma who presents to the emergency department to the emergency department from SNF due to increased work of breathing while on oxygen.  He was noted to have increased dependency on oxygen at the nursing facility today, so EMS was activated and patient was sent to the ED for further evaluation and management.  DuoNeb, Solu-Medrol, magnesium was given en route.  He was recently hospitalized from 730-8/1 due to having gross hematuria which was later on noted to be due to right renal mass that was concerning for RCC with suspicion for pulmonary metastasis.  Urology was consulted at that time and elective surgery was planned.  On 8/19, patient underwent right robotic assisted laparoscopic radical nephrectomy.  He was intubated for the procedure under general anesthesia.  Patient developed hypoxia postop and required NRB, TRH was consulted and patient was noted to have acute respiratory failure with hypoxia due to pneumonia.  He also developed acute metabolic encephalopathy.  Patient was discharged to SNF yesterday (9/26)     ED Course:   In the emergency department, he was tachypneic, tachycardic and O2 sat was 91 to 98% on supplemental oxygen via [place] at 6 LPM.  Workup in the ED showed WBC of 111.2 (this was 122.6 earlier today), hemoglobin 11.0, hematocrit 35.8, MCV 87.3, platelets 240.  BMP was normal except for blood glucose of 130, BUN/creatinine 27/1.64 (baseline creatinine at 1.4-1.6), albumin 2.5.  Influenza A, B, SARS coronavirus 2, RSV was negative.  Chest x-ray showed bilateral pulmonary nodules consistent with suspected metastatic disease.  Increasing bibasilar consolidation with new small right pleural  effusion which could reflect edema or infection.  Emphysema.  Dr. Ellin Saba (oncologist) was consulted and there was concern for recurrent pneumonia, PE and elevated BNP,  Patient was started on IV vancomycin and cefepime, IV Lasix 20 mg x 1 was given.  IV heparin drip due to suspicion for possible PE was started with plan for VQ scan in the morning.  Hospitalist was asked to admit patient for further evaluation and management.   Clinical Impression   Pt agreeable to OT evaluation. Pt wife at bedside throughout session. Pt completed bed mobility with min assist and use of HHA. Pt desated to 90 while completing bed mobility, O2 came back up to 98 after sitting EOB and taking deep breaths. Pt completed sit to stand t/f with use of RW and min assist. O2 dropped into 80s while completing t/f and HR increased into 140s. Once pt was seated in chair, HR continued to drop and then increase back up to 140s. RN notified of HR and O2 sats. Pt demonstrated slow labored movements and decreased safety awareness. Pt required repeated VC to take steps backwards towards chair instead of to the side when completing functional mobility.  Pt wife reports that he has a bed ready for him at the [facility] after d/c. Pt would continue to benefit from OT services.        If plan is discharge home, recommend the following: A little help with walking and/or transfers;A lot of help with bathing/dressing/bathroom;Direct supervision/assist for medications management;Assist for transportation    Functional Status Assessment  Patient has had a recent decline in their functional status and demonstrates the ability to make significant  improvements in function in a reasonable and predictable amount of time.  Equipment Recommendations  None recommended by OT       Precautions / Restrictions Precautions Precautions: Fall Restrictions Weight Bearing Restrictions: No      Mobility Bed Mobility Overal bed mobility: Needs  Assistance Bed Mobility: Supine to Sit Rolling:  (HHA) Sidelying to sit:  (HHA) Supine to sit: Min assist          Transfers Overall transfer level: Needs assistance Equipment used: Rolling walker (2 wheels) Transfers: Sit to/from Stand, Bed to chair/wheelchair/BSC Sit to Stand: Min assist     Step pivot transfers: Min assist     General transfer comment: unsteady labored movement      Balance Overall balance assessment: Needs assistance Sitting-balance support: Feet supported, No upper extremity supported Sitting balance-Leahy Scale: Good Sitting balance - Comments: seated at EOB   Standing balance support: Reliant on assistive device for balance, During functional activity, Bilateral upper extremity supported Standing balance-Leahy Scale: Fair Standing balance comment: using RW                           ADL either performed or assessed with clinical judgement   ADL Overall ADL's : Needs assistance/impaired Eating/Feeding: Set up (assist with cutting food) Eating/Feeding Details (indicate cue type and reason): assist with cutting food Grooming: Set up;Sitting   Upper Body Bathing: Minimal assistance;Sitting   Lower Body Bathing: Moderate assistance;Sit to/from stand   Upper Body Dressing : Minimal assistance;Sitting   Lower Body Dressing: Moderate assistance;Sitting/lateral leans   Toilet Transfer: Moderate assistance   Toileting- Clothing Manipulation and Hygiene: Moderate assistance   Tub/ Shower Transfer: Moderate assistance;BSC/3in1   Functional mobility during ADLs: Minimal assistance       Vision Baseline Vision/History: 0 No visual deficits Ability to See in Adequate Light: 0 Adequate Patient Visual Report: No change from baseline              Pertinent Vitals/Pain Pain Assessment Pain Assessment: No/denies pain     Extremity/Trunk Assessment Upper Extremity Assessment Upper Extremity Assessment: Overall WFL for tasks  assessed   Lower Extremity Assessment Lower Extremity Assessment: Defer to PT evaluation   Cervical / Trunk Assessment Cervical / Trunk Assessment: Normal   Communication Communication Communication: No apparent difficulties Cueing Techniques: Verbal cues   Cognition Arousal: Alert Behavior During Therapy: WFL for tasks assessed/performed Overall Cognitive Status: Within Functional Limits for tasks assessed (difficulty following directions at times)                         Following Commands: Follows one step commands consistently       General Comments: appears to have some mild confusion per wife but able to follow commands and answers therapist questions appropriately                Home Living Family/patient expects to be discharged to:: Skilled nursing facility Living Arrangements: Spouse/significant other Available Help at Discharge: Available 24 hours/day;Family Type of Home: House Home Access: Stairs to enter [employer] of Steps: 5-6 Entrance Stairs-Rails: Left;Right;Can reach both Home Layout: One level     Bathroom Shower/Tub: Producer, television/film/video: Standard Bathroom Accessibility: Yes How Accessible: Accessible via walker Home Equipment: Rolling Walker (2 wheels);Grab bars - toilet   Additional Comments: Pt wife reports that the RW they were borrowing had to be returned so he will now need one  after d/c from SNF      Prior Functioning/Environment Prior Level of Function : Independent/Modified Independent               ADLs Comments: Pt reports that prior to first hospitalization he was independent with bathing and dressing        OT Problem List: Decreased strength;Decreased activity tolerance;Impaired balance (sitting and/or standing)      OT Treatment/Interventions: Self-care/ADL training;Therapeutic exercise;DME and/or AE instruction;Therapeutic activities;Patient/family education    OT Goals(Current  goals can be found in the care plan section) Acute Rehab OT Goals Patient Stated Goal: to go to rehab OT Goal Formulation: With patient/family Time For Goal Achievement: 04/30/23 Potential to Achieve Goals: Good ADL Goals Pt Will Perform Upper Body Dressing: Independently;sitting Pt Will Perform Lower Body Dressing: with modified independence;sitting/lateral leans Pt Will Transfer to Toilet: with modified independence Pt Will Perform Toileting - Clothing Manipulation and hygiene: sitting/lateral leans;with min assist  OT Frequency: Min 2X/week       AM-PAC OT "6 Clicks" Daily Activity     Outcome Measure Help from another person eating meals?: A Little Help from another person taking care of personal grooming?: A Little Help from another person toileting, which includes using toliet, bedpan, or urinal?: A Lot Help from another person bathing (including washing, rinsing, drying)?: A Little Help from another person to put on and taking off regular upper body clothing?: A Little Help from another person to put on and taking off regular lower body clothing?: A Lot 6 Click Score: 16   End of Session Equipment Utilized During Treatment: Rolling walker (2 wheels);Oxygen Nurse Communication: Mobility status;Other (comment) (HR increased RN notified)  Activity Tolerance: Patient tolerated treatment well (HR increased RN notified) Patient left: in chair;with call bell/phone within reach;with family/visitor present  OT Visit Diagnosis: Unsteadiness on feet (R26.81);Muscle weakness (generalized) (M62.81)                Time: 0272-5366 OT Time Calculation (min): 26 min Charges:  OT General Charges $OT Visit: 1 Visit OT Evaluation $OT Eval Low Complexity: 1 Low  Bevelyn Ngo, OTR/L 04/16/2023, 12:27 PM

## 2023-04-16 NOTE — Plan of Care (Signed)
  Problem: Acute Rehab OT Goals (only OT should resolve) Goal: Pt. Will Perform Upper Body Dressing Flowsheets (Taken 04/16/2023 1224) Pt Will Perform Upper Body Dressing:  Independently  sitting Goal: Pt. Will Perform Lower Body Dressing Flowsheets (Taken 04/16/2023 1224) Pt Will Perform Lower Body Dressing:  with modified independence  sitting/lateral leans Goal: Pt. Will Transfer To Toilet Flowsheets (Taken 04/16/2023 1224) Pt Will Transfer to Toilet: with modified independence Goal: Pt. Will Perform Toileting-Clothing Manipulation Flowsheets (Taken 04/16/2023 1224) Pt Will Perform Toileting - Clothing Manipulation and hygiene:  sitting/lateral leans  with min assist

## 2023-04-16 NOTE — Progress Notes (Signed)
PROGRESS NOTE   Joshua Gentry  OZD:[id number] DOB: 1946/04/26 DOA: 03/20/2023 PCP: Assunta Found, MD   Chief Complaint  Patient presents with   Shortness of Breath   Level of care: Telemetry  Brief Admission History:  77 y.o. male with medical history significant of hypertension, hyperlipidemia, OSA on CPAP, CAD s/p CABG (97), obesity, multiple lung nodules, recently diagnosed with right renal mass, liposarcoma who presents to [facility] emergency department from SNF due to increased work of breathing.  He was recently hospitalized from 7/30-8/1 due to having gross hematuria, later noted to be due to right renal mass suspicious for RCC.  Urology was consulted at that time and elective surgery was planned.  On 8/19, patient underwent right robotic assisted laparoscopic radical nephrectomy.  He was intubated for the procedure under general anesthesia.  Patient developed hypoxia postop and required NRB, TRH was consulted and patient was noted to have acute respiratory failure with hypoxia due to pneumonia.  He was treated with 5 days of IV steroids and IV antibiotics, IV Rocephin and azithromycin and discharged to SNF on 04/12/23.  Plan at discharge was to repeat a chest x-ray in 4 weeks to ensure resolution of pneumonia findings.     The patient returned to the ED the next day and was admitted for recurrent pneumonia and was started on IV vancomycin and cefepime.  Due to concern for possible PE heparin drip was initiated in the ED.  Heparin drip was switched to full dose subcu Lovenox twice daily on 04/14/2023.   Assessment and Plan:  Acute respiratory failure with hypoxia secondary to multifactorial, improving Not on oxygen supplementation at baseline. Continue to treat underlying conditions, pneumonia, OSA, physical deconditioning Continue cefepime and IV vancomycin, obtain MRSA screening test if negative DC IV vancomycin. Continue CPAP nightly Continue PT OT with assistance and fall  precautions Continue out of bed to chair as tolerated Maintain O2 saturation above 92% Continue to wean off oxygen supplementation as able   Recurrent pneumonia Patient was started on vancomycin and cefepime, we shall continue same at this time with plan to de-escalate/discontinue based on blood culture, sputum culture, urine Legionella, strep pneumo and procalcitonin Continue Tylenol as needed Continue Mucinex, incentive spirometry, flutter valve  Personally reviewed imaging with admission chest x-ray showing worsening bibasilar infiltrates with new small right pleural effusion.   Elevated BNP rule out acute CHF BNP 3,483 Chest x-ray showed new small right pleural effusion which could reflect edema or infection 2D echo revealed LVEF 70 to 75% with no regional wall motion abnormalities and grade 1 diastolic dysfunction. Continue strict I's and O's and daily weight Diuretics were DC'd due to soft BPs to avoid hypotension.   Acute pulmonary embolism D-dimer 1.83 and V/Q scan highly suggestive of RUL pulmonary emboli Patient was started on IV heparin drip in the ED, switched to full dose subcu Lovenox on 04/14/2023 DOAC (apixaban) to start 8/31.   The patient will need to follow-up with hematology oncology after discharge.   Hypercalcemia possibly of malignancy related to right renal mass status post right kidney and adrenal nephrectomy. Levels are downtrending.  Sundowning He was started on evening dose of seroquel Haldol ordered PRN severe agitation    Exaggerated leukocytosis likely leukemoid reaction WBC 131.8K>> 131.4K>>102 Trending down with treatment    Lactic acidosis -resolved Lactic acid 2.1 > 1.8   Stage IIIb CKD Creatinine 1.83 with GFR of 38 Continue to avoid nephrotoxic agents and hypotension Repeat renal function test in the  morning   COPD (chronic obstructive pulmonary disease)  Stable, no clinical bronchospasm.   OSA on CPAP Continue CPAP nightly.     Essential hypertension Stable blood pressure Continue home meds   Obesity (BMI 31.19) Diet and lifestyle modification   Generalized weakness Continue PT OT with fall precautions.  DVT prophylaxis: enoxaparin  Code Status: Full  Family Communication: wife at bedside  Disposition: Status is: Inpatient   Consultants:   Procedures:   Antimicrobials:  Cefepime vancomycin Subjective: Pt is awake, no specific complaints.   Objective: Vitals:   04/16/23 1200 04/16/23 1300 04/16/23 1450 04/16/23 1500  BP: 125/76 (!) 111/34    Pulse: (!) 106  (!) 148 (!) 112  Resp: (!) 26 (!) 23 19 (!) 30  Temp: (!) 97.4 F (36.3 C)     TempSrc: Axillary     SpO2: 96%  (!) 89% 96%  Weight:      Height:        Intake/Output Summary (Last 24 hours) at 04/16/2023 1645 Last data filed at 04/16/2023 1429 Gross per 24 hour  Intake 540 ml  Output 500 ml  Net 40 ml   Filed Weights   03/30/2023 1640 04/15/23 0700 04/16/23 0500  Weight: 110.2 kg 106.6 kg 98.1 kg   Examination:  General exam: Appears calm and comfortable  Respiratory system: diminished BS RLL. Cardiovascular system: tachycardic rate; normal S1 & S2 heard. No JVD, murmurs, rubs, gallops or clicks. No pedal edema. Gastrointestinal system: Abdomen is nondistended, soft and nontender. No organomegaly or masses felt. Normal bowel sounds heard. Central nervous system: Alert and oriented. No focal neurological deficits. Extremities: Symmetric 5 x 5 power. Skin: No rashes, lesions or ulcers. Psychiatry: Judgement and insight appear poor. Mood & affect appropriate.   Data Reviewed: I have personally reviewed following labs and imaging studies  CBC: Recent Labs  Lab 04/12/23 0454 04/12/2023 1643 04/14/23 0552 04/14/23 1702 04/15/23 0403 04/16/23 0324  WBC 77.4* 111.2* 128.2* 131.8* 131.4* 102.0*  NEUTROABS 72.8* 102.3*  --  118.9*  --   --   HGB 10.8* 11.0* 10.2* 10.0* 10.6* 10.2*  HCT 36.1* 35.8* 33.3* 33.2* 34.2* 34.0*  MCV  87.6 87.3 87.6 87.8 87.5 88.1  PLT 263 240 210 206 220 207    Basic Metabolic Panel: Recent Labs  Lab 04/12/23 0454 03/21/2023 1643 04/14/23 0552 04/16/23 0324 04/16/23 0749  NA 138 137 138 140 139  K 3.9 4.0 3.8 3.0* 3.8  CL 105 103 106 108 108  CO2 25 23 24 22 27   GLUCOSE 102* 130* 145* 46* 117*  BUN 27* 27* 28* 28* 27*  CREATININE 1.50* 1.64* 1.83* 1.59* 1.64*  CALCIUM 11.4* 11.5* 10.7* 9.6 9.5  MG 1.9  --  2.3  --  2.1  PHOS  --   --  2.6  --   --     CBG: Recent Labs  Lab 04/16/23 0713  GLUCAP 114*    Recent Results (from the past 240 hour(s))  Resp panel by RT-PCR (RSV, Flu A&B, Covid) Anterior Nasal Swab     Status: None   Collection Time: 04/11/2023  4:43 PM   Specimen: Anterior Nasal Swab  Result Value Ref Range Status   SARS Coronavirus 2 by RT PCR NEGATIVE NEGATIVE Final    Comment: (NOTE) SARS-CoV-2 target nucleic acids are NOT DETECTED.  The SARS-CoV-2 RNA is generally detectable in upper respiratory specimens during the acute phase of infection. The lowest concentration of SARS-CoV-2 viral copies this assay can  detect is 138 copies/mL. A negative result does not preclude SARS-Cov-2 infection and should not be used as the sole basis for treatment or other patient management decisions. A negative result may occur with  improper specimen collection/handling, submission of specimen other than nasopharyngeal swab, presence of viral mutation(s) within the areas targeted by this assay, and inadequate number of viral copies(<138 copies/mL). A negative result must be combined with clinical observations, patient history, and epidemiological information. The expected result is Negative.  Fact Sheet for Patients:  BloggerCourse.com  Fact Sheet for Healthcare Providers:  SeriousBroker.it  This test is no t yet approved or cleared by the [place] FDA and  has been authorized for detection and/or diagnosis of  SARS-CoV-2 by FDA under an Emergency Use Authorization (EUA). This EUA will remain  in effect (meaning this test can be used) for the duration of the COVID-19 declaration under Section 564(b)(1) of the Act, 21 U.S.C.section 360bbb-3(b)(1), unless the authorization is terminated  or revoked sooner.       Influenza A by PCR NEGATIVE NEGATIVE Final   Influenza B by PCR NEGATIVE NEGATIVE Final    Comment: (NOTE) The Xpert Xpress SARS-CoV-2/FLU/RSV plus assay is intended as an aid in the diagnosis of influenza from Nasopharyngeal swab specimens and should not be used as a sole basis for treatment. Nasal washings and aspirates are unacceptable for Xpert Xpress SARS-CoV-2/FLU/RSV testing.  Fact Sheet for Patients: BloggerCourse.com  Fact Sheet for Healthcare Providers: SeriousBroker.it  This test is not yet approved or cleared by the [place] FDA and has been authorized for detection and/or diagnosis of SARS-CoV-2 by FDA under an Emergency Use Authorization (EUA). This EUA will remain in effect (meaning this test can be used) for the duration of the COVID-19 declaration under Section 564(b)(1) of the Act, 21 U.S.C. section 360bbb-3(b)(1), unless the authorization is terminated or revoked.     Resp Syncytial Virus by PCR NEGATIVE NEGATIVE Final    Comment: (NOTE) Fact Sheet for Patients: BloggerCourse.com  Fact Sheet for Healthcare Providers: SeriousBroker.it  This test is not yet approved or cleared by the [place] FDA and has been authorized for detection and/or diagnosis of SARS-CoV-2 by FDA under an Emergency Use Authorization (EUA). This EUA will remain in effect (meaning this test can be used) for the duration of the COVID-19 declaration under Section 564(b)(1) of the Act, 21 U.S.C. section 360bbb-3(b)(1), unless the authorization is terminated  or revoked.  Performed at [facility], [address], [place], [place] [address]   Culture, blood (routine x 2) Call MD if unable to obtain prior to antibiotics being given     Status: None (Preliminary result)   Collection Time: 04/17/2023  4:45 PM   Specimen: BLOOD LEFT HAND  Result Value Ref Range Status   Specimen Description BLOOD LEFT HAND  Final   Special Requests   Final    BOTTLES DRAWN AEROBIC AND ANAEROBIC Blood Culture adequate volume   Culture   Final    NO GROWTH 3 DAYS Performed at [facility], [address] [place], [place] [address]    Report Status PENDING  Incomplete  Culture, blood (routine x 2) Call MD if unable to obtain prior to antibiotics being given     Status: None (Preliminary result)   Collection Time: 04/12/2023 10:17 PM   Specimen: BLOOD LEFT HAND  Result Value Ref Range Status   Specimen Description   Final    BLOOD LEFT HAND Performed at [facility], [address], [place], [place] [address]    Special Requests   Final    BOTTLES DRAWN AEROBIC AND ANAEROBIC Blood Culture results may not be optimal due to an excessive volume of blood received in culture bottles   Culture   Final    NO GROWTH 3 DAYS Performed at [facility], [address], [place], [place] [address]    Report Status PENDING  Incomplete  MRSA Next Gen by PCR, Nasal     Status: None   Collection Time: 04/15/23  9:52 AM   Specimen: Nasal Mucosa; Nasal Swab  Result Value Ref Range Status   MRSA by PCR Next Gen NOT DETECTED NOT DETECTED Final    Comment: (NOTE) The GeneXpert MRSA Assay (FDA approved for NASAL specimens only), is one component of a comprehensive MRSA colonization surveillance program. It is not intended to diagnose MRSA infection nor to guide or monitor treatment for MRSA infections. Test performance is not FDA approved in patients less than 50 years old. Performed at [facility], [address] [place], [place] [address]      Radiology Studies: NM  Pulmonary Perfusion  Result Date: 04/16/2023 CLINICAL DATA:  Pulmonary embolism suspected, high probability. Right nephrectomy for renal cell carcinoma 04/05/2023 EXAM: NUCLEAR MEDICINE PERFUSION LUNG SCAN TECHNIQUE: Perfusion images were obtained in multiple projections after intravenous injection of radiopharmaceutical. Ventilation scans intentionally deferred if perfusion scan and chest x-ray adequate for interpretation during COVID 19 epidemic. RADIOPHARMACEUTICALS:  4.3 mCi Tc-35m MAA IV COMPARISON:  One-view chest x-ray 04/07/2023 FINDINGS: Wedge shaped segmental perfusion defects are present in the right upper lobe. Patchy filling defects on the left may be secondary to the metastases. IMPRESSION: Wedge-shaped perfusion defects in the right upper lobe consistent with pulmonary emboli. These results will be called to the ordering clinician or representative by the Radiologist Assistant, and communication documented in the PACS or [employer]. Electronically Signed   By: Marin Roberts M.D.   On: 04/16/2023 14:45    Scheduled Meds:  [START ON 04/17/2023] apixaban  10 mg Oral BID   Followed by   Melene Muller ON 04/24/2023] apixaban  5 mg Oral BID   calcium-vitamin D  1 tablet Oral BID   Chlorhexidine Gluconate Cloth  6 each Topical Daily   dextromethorphan-guaiFENesin  1 tablet Oral BID   enoxaparin (LOVENOX) injection  100 mg Subcutaneous BID   feeding supplement  1 Container Oral QHS   feeding supplement  237 mL Oral BID BM   metoprolol tartrate       metoprolol tartrate  5 mg Intravenous Q6H   multivitamin with minerals  1 tablet Oral Q lunch   [START ON 04/17/2023] multivitamin with minerals  1 tablet Oral Daily   QUEtiapine  25 mg Oral QPC supper   rosuvastatin  20 mg Oral QPM   [START ON 04/17/2023] spironolactone  25 mg Oral Daily   Continuous Infusions:  ceFEPime (MAXIPIME) IV Stopped (04/16/23 0617)     LOS: 3 days   Time spent: 50 mins  Teneka Malmberg Laural Benes, MD How to contact  the [facility] Attending or Consulting provider 7A - 7P or covering provider during after hours 7P -7A, for this patient?  Check the care team in [facility] and look for a) attending/consulting [facility] provider listed and b) the [facility] team listed Log into [email or web address] and use [facility] universal password to access. If you do not have the password, please contact the hospital operator. Locate the [facility] provider you are looking for under [facility] and page to a number that you can be directly reached. If you still have difficulty reaching the provider, please page the [facility] (Director on Call) for the Hospitalists listed on amion for assistance.  04/16/2023, 4:45 PM

## 2023-04-17 ENCOUNTER — Inpatient Hospital Stay (HOSPITAL_COMMUNITY): Payer: Medicare Other

## 2023-04-17 DIAGNOSIS — J9601 Acute respiratory failure with hypoxia: Secondary | ICD-10-CM | POA: Diagnosis not present

## 2023-04-17 DIAGNOSIS — J189 Pneumonia, unspecified organism: Secondary | ICD-10-CM | POA: Diagnosis not present

## 2023-04-17 DIAGNOSIS — N1832 Chronic kidney disease, stage 3b: Secondary | ICD-10-CM | POA: Diagnosis not present

## 2023-04-17 DIAGNOSIS — I10 Essential (primary) hypertension: Secondary | ICD-10-CM | POA: Diagnosis not present

## 2023-04-17 LAB — CBC WITH DIFFERENTIAL/PLATELET
Abs Immature Granulocytes: 1.1 10*3/uL — ABNORMAL HIGH (ref 0.00–0.07)
Basophils Absolute: 0 10*3/uL (ref 0.0–0.1)
Basophils Relative: 0 %
Eosinophils Absolute: 0 10*3/uL (ref 0.0–0.5)
Eosinophils Relative: 0 %
HCT: 33.9 % — ABNORMAL LOW (ref 39.0–52.0)
Hemoglobin: 10.2 g/dL — ABNORMAL LOW (ref 13.0–17.0)
Lymphocytes Relative: 0 %
Lymphs Abs: 0 10*3/uL — ABNORMAL LOW (ref 0.7–4.0)
MCH: 27.1 pg (ref 26.0–34.0)
MCHC: 30.1 g/dL (ref 30.0–36.0)
MCV: 89.9 fL (ref 80.0–100.0)
Metamyelocytes Relative: 1 %
Monocytes Absolute: 3.3 10*3/uL — ABNORMAL HIGH (ref 0.1–1.0)
Monocytes Relative: 3 %
Neutro Abs: 107.1 10*3/uL — ABNORMAL HIGH (ref 1.7–7.7)
Neutrophils Relative %: 96 %
Platelets: 219 10*3/uL (ref 150–400)
RBC: 3.77 MIL/uL — ABNORMAL LOW (ref 4.22–5.81)
RDW: 17.1 % — ABNORMAL HIGH (ref 11.5–15.5)
WBC: 111.6 10*3/uL (ref 4.0–10.5)
nRBC: 0 % (ref 0.0–0.2)

## 2023-04-17 MED ORDER — IPRATROPIUM-ALBUTEROL 0.5-2.5 (3) MG/3ML IN SOLN
3.0000 mL | RESPIRATORY_TRACT | Status: DC | PRN
  Administered 2023-04-17: 3 mL via RESPIRATORY_TRACT
  Filled 2023-04-17: qty 3

## 2023-04-17 MED ORDER — METOPROLOL SUCCINATE ER 100 MG PO TB24
100.0000 mg | ORAL_TABLET | Freq: Every day | ORAL | Status: DC
  Administered 2023-04-17 – 2023-04-18 (×2): 100 mg via ORAL
  Filled 2023-04-17 (×2): qty 2

## 2023-04-17 MED ORDER — ALBUTEROL SULFATE (2.5 MG/3ML) 0.083% IN NEBU
INHALATION_SOLUTION | RESPIRATORY_TRACT | Status: AC
  Administered 2023-04-17: 2.5 mg
  Filled 2023-04-17: qty 3

## 2023-04-17 NOTE — Progress Notes (Signed)
PROGRESS NOTE   Joshua Gentry  WUJ:[id number] DOB: 1946-02-04 DOA: 03/19/2023 PCP: Assunta Found, MD   Chief Complaint  Patient presents with   Shortness of Breath   Level of care: Stepdown  Brief Admission History:  77 y.o. male with medical history significant of hypertension, hyperlipidemia, OSA on CPAP, CAD s/p CABG (97), obesity, multiple lung nodules, recently diagnosed with right renal mass, liposarcoma who presents to [facility] emergency department from SNF due to increased work of breathing.  He was recently hospitalized from 7/30-8/1 due to having gross hematuria, later noted to be due to right renal mass suspicious for RCC.  Urology was consulted at that time and elective surgery was planned.  On 8/19, patient underwent right robotic assisted laparoscopic radical nephrectomy.  He was intubated for the procedure under general anesthesia.  Patient developed hypoxia postop and required NRB, TRH was consulted and patient was noted to have acute respiratory failure with hypoxia due to pneumonia.  He was treated with 5 days of IV steroids and IV antibiotics, IV Rocephin and azithromycin and discharged to SNF on 04/12/23.  Plan at discharge was to repeat a chest x-ray in 4 weeks to ensure resolution of pneumonia findings.     The patient returned to the ED the next day and was admitted for recurrent pneumonia and was started on IV vancomycin and cefepime.  Due to concern for possible PE heparin drip was initiated in the ED.  Heparin drip was switched to full dose subcu Lovenox twice daily on 04/14/2023.   Assessment and Plan:  Acute respiratory failure with hypoxia secondary to multifactorial, improving Not on oxygen supplementation at baseline. Continue to treat underlying conditions, pneumonia, OSA, physical deconditioning Continue cefepime and IV vancomycin, obtain MRSA screening test if negative DC IV vancomycin. Continue CPAP nightly Continue PT/OT with assistance and fall  precautions Continue out of bed to chair as tolerated Maintain O2 saturation above 92% Continue to wean off oxygen supplementation as able   Recurrent pneumonia Patient was started on vancomycin and cefepime, we shall continue same at this time with plan to de-escalate/discontinue based on blood culture, sputum culture, urine Legionella, strep pneumo and procalcitonin Continue Tylenol as needed Continue Mucinex, incentive spirometry, flutter valve  Personally reviewed imaging with admission chest x-ray showing worsening bibasilar infiltrates with new small right pleural effusion.   Elevated BNP rule out acute CHF BNP 3,483 Chest x-ray showed new small right pleural effusion which could reflect edema or infection 2D echo revealed LVEF 70 to 75% with no regional wall motion abnormalities and grade 1 diastolic dysfunction. Continue strict I's and O's and daily weight Diuretics were DC'd due to soft BPs to avoid hypotension.  Sinus tachycardia with PVCs -exacerbated by infection, cancer and pulmonary emboli -he has been restarted on home metoprolol XL 100 mg daily    Acute pulmonary embolism D-dimer 1.83 and V/Q scan highly suggestive of RUL pulmonary emboli Patient was started on IV heparin drip in the ED, switched to full dose subcu Lovenox on 04/14/2023 DOAC (apixaban) to start 8/31.   The patient will need to follow-up with hematology oncology after discharge.   Hypercalcemia possibly of malignancy related to right renal mass status post right kidney and adrenal nephrectomy. Levels are downtrending.  Sundowning He was started on evening dose of seroquel Haldol ordered PRN severe agitation    Exaggerated leukocytosis likely leukemoid reaction WBC 131.8K>> 131.4K>>102 Trending down with treatment    Lactic acidosis -resolved Lactic acid 2.1 > 1.8  Stage IIIb CKD Creatinine 1.83 with GFR of 38 Continue to avoid nephrotoxic agents and hypotension Repeat renal function test in  the morning   COPD (chronic obstructive pulmonary disease)  Stable, no clinical bronchospasm.   OSA on CPAP Continue CPAP nightly.    Essential hypertension Stable blood pressure Continue home meds   Obesity (BMI 31.19) Diet and lifestyle modification   Generalized weakness Continue PT OT with fall precautions.  DVT prophylaxis: enoxaparin  Code Status: Full  Family Communication: wife at bedside  Disposition: Status is: Inpatient   Consultants:   Procedures:   Antimicrobials:  Cefepime vancomycin  Subjective: He is not eating well.  No specific complaint to me today.   Objective: Vitals:   04/17/23 1315 04/17/23 1500 04/17/23 1624 04/17/23 1635  BP:  (!) 124/56 (!) 154/68   Pulse:  (!) 101 (!) 103   Resp:  (!) 22 (!) 26   Temp: (!) 97.5 F (36.4 C)   (!) 97.3 F (36.3 C)  TempSrc: Axillary   Axillary  SpO2:  92%    Weight:      Height:        Intake/Output Summary (Last 24 hours) at 04/17/2023 1642 Last data filed at 04/17/2023 1259 Gross per 24 hour  Intake 320 ml  Output 576 ml  Net -256 ml   Filed Weights   04/15/23 0700 04/16/23 0500 04/17/23 0513  Weight: 106.6 kg 98.1 kg 106.7 kg   Examination:  General exam: Appears calm and comfortable  Respiratory system: diminished BS RLL. Rales head bilaterally. Cardiovascular system: tachycardic rate; normal S1 & S2 heard. No JVD, murmurs, rubs, gallops or clicks. No pedal edema. Gastrointestinal system: Abdomen is nondistended, soft and nontender. No organomegaly or masses felt. Normal bowel sounds heard. Central nervous system: Alert and oriented. No focal neurological deficits. Extremities: Symmetric 5 x 5 power. Skin: No rashes, lesions or ulcers. Psychiatry: Judgement and insight appear poor. Mood & affect appropriate.   Data Reviewed: I have personally reviewed following labs and imaging studies  CBC: Recent Labs  Lab 04/12/23 0454 03/28/2023 1643 04/14/23 0552 04/14/23 1702 04/15/23 0403  04/16/23 0324 04/17/23 0354  WBC 77.4* 111.2* 128.2* 131.8* 131.4* 102.0* 111.6*  NEUTROABS 72.8* 102.3*  --  118.9*  --   --  107.1*  HGB 10.8* 11.0* 10.2* 10.0* 10.6* 10.2* 10.2*  HCT 36.1* 35.8* 33.3* 33.2* 34.2* 34.0* 33.9*  MCV 87.6 87.3 87.6 87.8 87.5 88.1 89.9  PLT 263 240 210 206 220 207 219    Basic Metabolic Panel: Recent Labs  Lab 04/12/23 0454 04/03/2023 1643 04/14/23 0552 04/16/23 0324 04/16/23 0749  NA 138 137 138 140 139  K 3.9 4.0 3.8 3.0* 3.8  CL 105 103 106 108 108  CO2 25 23 24 22 27   GLUCOSE 102* 130* 145* 46* 117*  BUN 27* 27* 28* 28* 27*  CREATININE 1.50* 1.64* 1.83* 1.59* 1.64*  CALCIUM 11.4* 11.5* 10.7* 9.6 9.5  MG 1.9  --  2.3  --  2.1  PHOS  --   --  2.6  --   --     CBG: Recent Labs  Lab 04/16/23 0713  GLUCAP 114*    Recent Results (from the past 240 hour(s))  Resp panel by RT-PCR (RSV, Flu A&B, Covid) Anterior Nasal Swab     Status: None   Collection Time: 04/15/2023  4:43 PM   Specimen: Anterior Nasal Swab  Result Value Ref Range Status   SARS Coronavirus 2 by RT PCR  NEGATIVE NEGATIVE Final    Comment: (NOTE) SARS-CoV-2 target nucleic acids are NOT DETECTED.  The SARS-CoV-2 RNA is generally detectable in upper respiratory specimens during the acute phase of infection. The lowest concentration of SARS-CoV-2 viral copies this assay can detect is 138 copies/mL. A negative result does not preclude SARS-Cov-2 infection and should not be used as the sole basis for treatment or other patient management decisions. A negative result may occur with  improper specimen collection/handling, submission of specimen other than nasopharyngeal swab, presence of viral mutation(s) within the areas targeted by this assay, and inadequate number of viral copies(<138 copies/mL). A negative result must be combined with clinical observations, patient history, and epidemiological information. The expected result is Negative.  Fact Sheet for Patients:   BloggerCourse.com  Fact Sheet for Healthcare Providers:  SeriousBroker.it  This test is no t yet approved or cleared by the [place] FDA and  has been authorized for detection and/or diagnosis of SARS-CoV-2 by FDA under an Emergency Use Authorization (EUA). This EUA will remain  in effect (meaning this test can be used) for the duration of the COVID-19 declaration under Section 564(b)(1) of the Act, 21 U.S.C.section 360bbb-3(b)(1), unless the authorization is terminated  or revoked sooner.       Influenza A by PCR NEGATIVE NEGATIVE Final   Influenza B by PCR NEGATIVE NEGATIVE Final    Comment: (NOTE) The Xpert Xpress SARS-CoV-2/FLU/RSV plus assay is intended as an aid in the diagnosis of influenza from Nasopharyngeal swab specimens and should not be used as a sole basis for treatment. Nasal washings and aspirates are unacceptable for Xpert Xpress SARS-CoV-2/FLU/RSV testing.  Fact Sheet for Patients: BloggerCourse.com  Fact Sheet for Healthcare Providers: SeriousBroker.it  This test is not yet approved or cleared by the [place] FDA and has been authorized for detection and/or diagnosis of SARS-CoV-2 by FDA under an Emergency Use Authorization (EUA). This EUA will remain in effect (meaning this test can be used) for the duration of the COVID-19 declaration under Section 564(b)(1) of the Act, 21 U.S.C. section 360bbb-3(b)(1), unless the authorization is terminated or revoked.     Resp Syncytial Virus by PCR NEGATIVE NEGATIVE Final    Comment: (NOTE) Fact Sheet for Patients: BloggerCourse.com  Fact Sheet for Healthcare Providers: SeriousBroker.it  This test is not yet approved or cleared by the [place] FDA and has been authorized for detection and/or diagnosis of SARS-CoV-2 by FDA under an Emergency Use  Authorization (EUA). This EUA will remain in effect (meaning this test can be used) for the duration of the COVID-19 declaration under Section 564(b)(1) of the Act, 21 U.S.C. section 360bbb-3(b)(1), unless the authorization is terminated or revoked.  Performed at [facility], [address] [place], [place] [address]   Culture, blood (routine x 2) Call MD if unable to obtain prior to antibiotics being given     Status: None (Preliminary result)   Collection Time: 04/03/2023  4:45 PM   Specimen: BLOOD LEFT HAND  Result Value Ref Range Status   Specimen Description BLOOD LEFT HAND  Final   Special Requests   Final    BOTTLES DRAWN AEROBIC AND ANAEROBIC Blood Culture adequate volume   Culture   Final    NO GROWTH 4 DAYS Performed at [facility], [address], [place], [place] [address]    Report Status PENDING  Incomplete  Culture, blood (routine x 2) Call MD if unable to obtain prior to antibiotics being given     Status:  None (Preliminary result)   Collection Time: 03/26/2023 10:17 PM   Specimen: BLOOD LEFT HAND  Result Value Ref Range Status   Specimen Description   Final    BLOOD LEFT HAND Performed at [facility], [address], [place], [place] [address]    Special Requests   Final    BOTTLES DRAWN AEROBIC AND ANAEROBIC Blood Culture results may not be optimal due to an excessive volume of blood received in culture bottles   Culture   Final    NO GROWTH 4 DAYS Performed at [facility], [address], [place], [place] [address]    Report Status PENDING  Incomplete  MRSA Next Gen by PCR, Nasal     Status: None   Collection Time: 04/15/23  9:52 AM   Specimen: Nasal Mucosa; Nasal Swab  Result Value Ref Range Status   MRSA by PCR Next Gen NOT DETECTED NOT DETECTED Final    Comment: (NOTE) The GeneXpert MRSA Assay (FDA approved for NASAL specimens only), is one component of a comprehensive MRSA colonization surveillance program. It is not intended to diagnose MRSA  infection nor to guide or monitor treatment for MRSA infections. Test performance is not FDA approved in patients less than 57 years old. Performed at [facility], [address] [place], [place] [address]      Radiology Studies: US Venous Img Lower Bilateral (DVT)  Result Date: 04/17/2023 CLINICAL DATA:  The now acute pulmonary embolism. Evaluate for residual DVT. EXAM: BILATERAL LOWER EXTREMITY VENOUS DOPPLER ULTRASOUND TECHNIQUE: Gray-scale sonography with graded compression, as well as color Doppler and duplex ultrasound were performed to evaluate the lower extremity deep venous systems from the level of the common femoral vein and including the common femoral, femoral, profunda femoral, popliteal and calf veins including the posterior tibial, peroneal and gastrocnemius veins when visible. The superficial great saphenous vein was also interrogated. Spectral Doppler was utilized to evaluate flow at rest and with distal augmentation maneuvers in the common femoral, femoral and popliteal veins. COMPARISON:  None Available. FINDINGS: RIGHT LOWER EXTREMITY Common Femoral Vein: No evidence of thrombus. Normal compressibility, respiratory phasicity and response to augmentation. Saphenofemoral Junction: No evidence of thrombus. Normal compressibility and flow on color Doppler imaging. Profunda Femoral Vein: No evidence of thrombus. Normal compressibility and flow on color Doppler imaging. Femoral Vein: No evidence of thrombus. Normal compressibility, respiratory phasicity and response to augmentation. Popliteal Vein: No evidence of thrombus. Normal compressibility, respiratory phasicity and response to augmentation. Calf Veins: No evidence of thrombus. Normal compressibility and flow on color Doppler imaging. Superficial Great Saphenous Vein: No evidence of thrombus. Normal compressibility. Venous Reflux:  None. Other Findings:  None. LEFT LOWER EXTREMITY Common Femoral Vein: No evidence of thrombus. Normal  compressibility, respiratory phasicity and response to augmentation. Saphenofemoral Junction: No evidence of thrombus. Normal compressibility and flow on color Doppler imaging. Profunda Femoral Vein: No evidence of thrombus. Normal compressibility and flow on color Doppler imaging. Femoral Vein: No evidence of thrombus. Normal compressibility, respiratory phasicity and response to augmentation. Popliteal Vein: No evidence of thrombus. Normal compressibility, respiratory phasicity and response to augmentation. Calf Veins: No evidence of thrombus. Normal compressibility and flow on color Doppler imaging. Superficial Great Saphenous Vein: No evidence of thrombus. Normal compressibility. Venous Reflux:  None. Other Findings:  None. IMPRESSION: No evidence of deep venous thrombosis in either lower extremity. Electronically Signed   By: Malachy Moan M.D.   On: 04/17/2023 10:48   NM Pulmonary Perfusion  Result Date:  04/16/2023 CLINICAL DATA:  Pulmonary embolism suspected, high probability. Right nephrectomy for renal cell carcinoma 04/05/2023 EXAM: NUCLEAR MEDICINE PERFUSION LUNG SCAN TECHNIQUE: Perfusion images were obtained in multiple projections after intravenous injection of radiopharmaceutical. Ventilation scans intentionally deferred if perfusion scan and chest x-ray adequate for interpretation during COVID 19 epidemic. RADIOPHARMACEUTICALS:  4.3 mCi Tc-57m MAA IV COMPARISON:  One-view chest x-ray 03/20/2023 FINDINGS: Wedge shaped segmental perfusion defects are present in the right upper lobe. Patchy filling defects on the left may be secondary to the metastases. IMPRESSION: Wedge-shaped perfusion defects in the right upper lobe consistent with pulmonary emboli. These results will be called to the ordering clinician or representative by the Radiologist Assistant, and communication documented in the PACS or [employer]. Electronically Signed   By: Marin Roberts M.D.   On: 04/16/2023 14:45     Scheduled Meds:  apixaban  10 mg Oral BID   Followed by   Melene Muller ON 04/24/2023] apixaban  5 mg Oral BID   calcium-vitamin D  1 tablet Oral BID   Chlorhexidine Gluconate Cloth  6 each Topical Daily   dextromethorphan-guaiFENesin  1 tablet Oral BID   feeding supplement  1 Container Oral QHS   feeding supplement  237 mL Oral BID BM   metoprolol succinate  100 mg Oral Daily   multivitamin with minerals  1 tablet Oral Q lunch   QUEtiapine  25 mg Oral QPC supper   rosuvastatin  20 mg Oral QPM   Continuous Infusions:  ceFEPime (MAXIPIME) IV Stopped (04/17/23 0604)     LOS: 4 days   Time spent: 35 mins  Mance Vallejo Laural Benes, MD How to contact the [facility] Attending or Consulting provider 7A - 7P or covering provider during after hours 7P -7A, for this patient?  Check the care team in [facility] and look for a) attending/consulting TRH provider listed and b) the [facility] team listed Log into [email or web address] and use [facility]'s universal password to access. If you do not have the password, please contact the hospital operator. Locate the [facility] provider you are looking for under [facility] and page to a number that you can be directly reached. If you still have difficulty reaching the provider, please page the [facility] (Director on Call) for the Hospitalists listed on amion for assistance.  04/17/2023, 4:42 PM

## 2023-04-17 NOTE — Plan of Care (Signed)

## 2023-04-18 ENCOUNTER — Inpatient Hospital Stay (HOSPITAL_COMMUNITY): Payer: Medicare Other

## 2023-04-18 DIAGNOSIS — I455 Other specified heart block: Secondary | ICD-10-CM

## 2023-04-18 DIAGNOSIS — I4891 Unspecified atrial fibrillation: Secondary | ICD-10-CM | POA: Diagnosis not present

## 2023-04-18 DIAGNOSIS — G9340 Encephalopathy, unspecified: Secondary | ICD-10-CM | POA: Diagnosis not present

## 2023-04-18 DIAGNOSIS — J9601 Acute respiratory failure with hypoxia: Secondary | ICD-10-CM | POA: Diagnosis not present

## 2023-04-18 LAB — CULTURE, BLOOD (ROUTINE X 2)
Culture: NO GROWTH
Culture: NO GROWTH
Special Requests: ADEQUATE

## 2023-04-18 LAB — BASIC METABOLIC PANEL
Anion gap: 8 (ref 5–15)
BUN: 26 mg/dL — ABNORMAL HIGH (ref 8–23)
CO2: 22 mmol/L (ref 22–32)
Calcium: 9 mg/dL (ref 8.9–10.3)
Chloride: 108 mmol/L (ref 98–111)
Creatinine, Ser: 1.64 mg/dL — ABNORMAL HIGH (ref 0.61–1.24)
GFR, Estimated: 43 mL/min — ABNORMAL LOW (ref >60)
Glucose, Bld: 118 mg/dL — ABNORMAL HIGH (ref 70–99)
Potassium: 3.3 mmol/L — ABNORMAL LOW (ref 3.5–5.1)
Sodium: 138 mmol/L (ref 135–145)

## 2023-04-18 LAB — CBC WITH DIFFERENTIAL/PLATELET
Abs Immature Granulocytes: 4.55 10*3/uL — ABNORMAL HIGH (ref 0.00–0.07)
Basophils Absolute: 0 10*3/uL (ref 0.0–0.1)
Basophils Relative: 0 %
Eosinophils Absolute: 0.2 10*3/uL (ref 0.0–0.5)
Eosinophils Relative: 0 %
HCT: 29.5 % — ABNORMAL LOW (ref 39.0–52.0)
Hemoglobin: 9 g/dL — ABNORMAL LOW (ref 13.0–17.0)
Immature Granulocytes: 5 %
Lymphocytes Relative: 3 %
Lymphs Abs: 2.3 10*3/uL (ref 0.7–4.0)
MCH: 26.5 pg (ref 26.0–34.0)
MCHC: 30.5 g/dL (ref 30.0–36.0)
MCV: 86.8 fL (ref 80.0–100.0)
Monocytes Absolute: 1.8 10*3/uL — ABNORMAL HIGH (ref 0.1–1.0)
Monocytes Relative: 2 %
Neutro Abs: 84 10*3/uL — ABNORMAL HIGH (ref 1.7–7.7)
Neutrophils Relative %: 90 %
Platelets: 190 10*3/uL (ref 150–400)
RBC: 3.4 MIL/uL — ABNORMAL LOW (ref 4.22–5.81)
RDW: 17.2 % — ABNORMAL HIGH (ref 11.5–15.5)
WBC: 92.8 10*3/uL (ref 4.0–10.5)
nRBC: 0 % (ref 0.0–0.2)

## 2023-04-18 LAB — BLOOD GAS, ARTERIAL
Acid-base deficit: 3.3 mmol/L — ABNORMAL HIGH (ref 0.0–2.0)
Bicarbonate: 19.9 mmol/L — ABNORMAL LOW (ref 20.0–28.0)
Drawn by: 27016
FIO2: 54 %
O2 Saturation: 95.5 %
Patient temperature: 36.5
pCO2 arterial: 29 mmHg — ABNORMAL LOW (ref 32–48)
pH, Arterial: 7.44 (ref 7.35–7.45)
pO2, Arterial: 65 mmHg — ABNORMAL LOW (ref 83–108)

## 2023-04-18 LAB — GLUCOSE, CAPILLARY: Glucose-Capillary: 139 mg/dL — ABNORMAL HIGH (ref 70–99)

## 2023-04-18 LAB — MAGNESIUM: Magnesium: 2 mg/dL (ref 1.7–2.4)

## 2023-04-18 MED ORDER — SODIUM CHLORIDE 0.9 % IV BOLUS
250.0000 mL | Freq: Once | INTRAVENOUS | Status: AC
  Administered 2023-04-18: 250 mL via INTRAVENOUS

## 2023-04-18 MED ORDER — LORAZEPAM 2 MG/ML PO CONC: 0.5000 mg | ORAL | Status: DC | PRN

## 2023-04-18 MED ORDER — LORAZEPAM 0.5 MG PO TABS
0.5000 mg | ORAL_TABLET | ORAL | Status: DC | PRN
  Administered 2023-04-18: 0.5 mg via ORAL
  Filled 2023-04-18: qty 1

## 2023-04-18 MED ORDER — POTASSIUM CHLORIDE 20 MEQ PO PACK
40.0000 meq | PACK | Freq: Once | ORAL | Status: AC
  Administered 2023-04-18: 40 meq via ORAL
  Filled 2023-04-18: qty 2

## 2023-04-18 NOTE — Progress Notes (Addendum)
PROGRESS NOTE   Joshua Gentry  ZOX:[id number] DOB: 11-Sep-1945 DOA: 04/10/2023 PCP: Assunta Found, MD   Chief Complaint  Patient presents with   Shortness of Breath   Level of care: Progressive  Brief Admission History:  77 y.o. male with medical history significant of hypertension, hyperlipidemia, OSA on CPAP, CAD s/p CABG (97), obesity, multiple lung nodules, recently diagnosed with right renal mass, liposarcoma who presents to [facility] emergency department from SNF due to increased work of breathing.  He was recently hospitalized from 7/30-8/1 due to having gross hematuria, later noted to be due to right renal mass suspicious for RCC.  Urology was consulted at that time and elective surgery was planned.  On 8/19, patient underwent right robotic assisted laparoscopic radical nephrectomy.  He was intubated for the procedure under general anesthesia.  Patient developed hypoxia postop and required NRB, TRH was consulted and patient was noted to have acute respiratory failure with hypoxia due to pneumonia.  He was treated with 5 days of IV steroids and IV antibiotics, IV Rocephin and azithromycin and discharged to SNF on 04/12/23.  Plan at discharge was to repeat a chest x-ray in 4 weeks to ensure resolution of pneumonia findings.     The patient returned to the ED the next day and was admitted for recurrent pneumonia and was started on IV vancomycin and cefepime.  Due to concern for possible PE heparin drip was initiated in the ED.  Heparin drip was switched to full dose subcu Lovenox twice daily on 04/14/2023.  Pt found to have RUL pulmonary emboli.  He developed sinus pauses and subsequently developed Afib RVR.  He is transferring to [facility] on 9/1 for cardiology consultation.    Assessment and Plan:  Acute respiratory failure with hypoxia secondary to multifactorial causes Not on oxygen supplementation at baseline. He now presents with pulmonary emboli, probable lung metastases and pneumonia  superimposed on advanced COPD  Continue to treat underlying conditions as able  Continue cefepime for presumed HCAP, DC'd IV vancomycin, with neg MRSA scree Continue CPAP nightly Continue out of bed to chair as tolerated Maintain O2 saturation above 88% Continue to wean oxygen supplementation as able   Recurrent pneumonia, presumed HCAP given recent hospitalization and surgery Continue cefepime  Continue Tylenol as needed Continue Mucinex, incentive spirometry, flutter valve  Personally reviewed imaging chest x-ray showing worsening bibasilar infiltrates with new small right pleural effusion.   Elevated BNP, chronic HFpEF BNP 3,483 Chest x-ray showed new small right pleural effusion which could reflect edema or infection 2D echo revealed LVEF 70 to 75% with no regional wall motion abnormalities and grade 1 diastolic dysfunction. Continue strict I's and O's and daily weight Diuretics were DC'd due to soft BPs to avoid hypotension.  Sinus tachycardia with PVCs --- now with Atrial fibrillation with RVR -he has now developed Afib RVR but also had sinus pause near 5 secs overnight with hypotension -pacer pads were placed overnight -he is now in Afib RVR with HR 130s, BPs is holding stable -I discussed case with cardiologist Dr. Jacques Navy who agreed with transfer to [facility] and will consult, hold amiodarone for now, he had already received his AM metoprolol -exacerbated by infection, cancer and pulmonary emboli -he had been restarted on home metoprolol XL 100 mg daily  -transfer to [facility] progressive bed for cardiology consultation (no cardio at [facility] for next 2 days)  Sinus Pauses Pt had sinus pause overnight of about 5 seconds and associated with hypotension Pacer pads placed  but not used He has since been in Afib with RVR Discussed with cardiology and transferring to [facility] for cardiology consultation Holding off starting amiodarone at this time, already received morning dose of metoprolol   Acute  pulmonary embolism D-dimer 1.83 and V/Q scan highly suggestive of RUL pulmonary emboli Patient was started on IV heparin drip in the ED, switched to full dose subcu Lovenox on 04/14/2023 DOAC (apixaban) to start 8/31.   The patient will need to follow-up with hematology oncology after discharge.  Anemia in Chronic Kidney Disease - we are monitoring his Hg closely in setting of recently starting full anticoagulation - hemoccult to stools  - no evidence of bleeding at this time - Hg down to 9.0    Hypercalcemia possibly of malignancy related to right renal mass status post right kidney and adrenal nephrectomy. Levels are downtrending.  Sundowning/Acute hospital delirium  He was started on evening dose of seroquel with supper Haldol ordered PRN severe agitation  Delirium precautions ordered   Exaggerated leukocytosis likely leukemoid reaction WBC 131.8K>>131.4K>>102>>92 Discussed with Dr. Ellin Saba, he felt this is most likely a leukemoid reaction and to treat pneumonia and follow  WBC slowly trending down with treatment    Lactic acidosis -resolved Lactic acid 2.1 > 1.8   Stage IIIb CKD Creatinine holding stable around 1.6  Continue to avoid nephrotoxic agents and hypotension Following renal function panel   Hypokalemia Potassium down to 3.3, given Kdur 40 meq on 9/1 Recheck in AM Magnesium is repleted    Advanced COPD bullous emphysema   Bronchodilators ordered as needed   OSA on CPAP Continue CPAP nightly.    Essential hypertension Stable blood pressure Continue home meds   Obesity (BMI 31.19) Diet and lifestyle modification   Generalized weakness Continue PT/OT with fall precautions.  DVT prophylaxis: enoxaparin  Code Status: Full  Family Communication: wife at bedside  Disposition: Status is: Inpatient   Consultants:  Cardiology service at [facility] Dr Jacques Navy   Procedures:   Antimicrobials:  Cefepime vancomycin  Subjective: Pt having more shortness of  breath symptoms, he denies palpitations, he denies CP   Objective: Vitals:   04/18/23 0751 04/18/23 0809 04/18/23 0900 04/18/23 1004  BP:  (!) 120/52 119/65 (!) 118/59  Pulse:  (!) 110 97   Resp:   (!) 25 (!) 22  Temp: (!) 97.3 F (36.3 C)     TempSrc: Axillary     SpO2:   92% 96%  Weight:      Height:        Intake/Output Summary (Last 24 hours) at 04/18/2023 1110 Last data filed at 04/18/2023 0600 Gross per 24 hour  Intake 540 ml  Output 226 ml  Net 314 ml   Filed Weights   04/16/23 0500 04/17/23 0513 04/18/23 0438  Weight: 98.1 kg 106.7 kg 104.6 kg   Examination:  General exam: Appears chronically ill, moderately distressed.  Respiratory system: diminished BS RLL. Rales head bilaterally at bases. Cardiovascular system: tachycardic rate; irregular rate; normal S1 & S2 heard. No JVD, trace pedal edema. Gastrointestinal system: Abdomen is nondistended, soft and nontender. No organomegaly or masses felt. Normal bowel sounds heard. Central nervous system: Alert and oriented to person, place, but intermittently confused. No focal neurological deficits. Extremities: Symmetric 5 x 5 power. Skin: age related changes.  Psychiatry: Judgement and insight appear poor. Mood & affect appropriate.   Data Reviewed: I have personally reviewed following labs and imaging studies  CBC: Recent Labs  Lab 04/12/23 0454 03/28/2023  1643 04/14/23 0552 04/14/23 1702 04/15/23 0403 04/16/23 0324 04/17/23 0354 04/18/23 0149  WBC 77.4* 111.2*   < > 131.8* 131.4* 102.0* 111.6* 92.8*  NEUTROABS 72.8* 102.3*  --  118.9*  --   --  107.1* 84.0*  HGB 10.8* 11.0*   < > 10.0* 10.6* 10.2* 10.2* 9.0*  HCT 36.1* 35.8*   < > 33.2* 34.2* 34.0* 33.9* 29.5*  MCV 87.6 87.3   < > 87.8 87.5 88.1 89.9 86.8  PLT 263 240   < > 206 220 207 219 190   < > = values in this interval not displayed.    Basic Metabolic Panel: Recent Labs  Lab 04/12/23 0454 04/16/2023 1643 04/14/23 0552 04/16/23 0324 04/16/23 0749  04/18/23 0149  NA 138 137 138 140 139 138  K 3.9 4.0 3.8 3.0* 3.8 3.3*  CL 105 103 106 108 108 108  CO2 25 23 24 22 27 22   GLUCOSE 102* 130* 145* 46* 117* 118*  BUN 27* 27* 28* 28* 27* 26*  CREATININE 1.50* 1.64* 1.83* 1.59* 1.64* 1.64*  CALCIUM 11.4* 11.5* 10.7* 9.6 9.5 9.0  MG 1.9  --  2.3  --  2.1 2.0  PHOS  --   --  2.6  --   --   --     CBG: Recent Labs  Lab 04/16/23 0713  GLUCAP 114*    Recent Results (from the past 240 hour(s))  Resp panel by RT-PCR (RSV, Flu A&B, Covid) Anterior Nasal Swab     Status: None   Collection Time: 03/22/2023  4:43 PM   Specimen: Anterior Nasal Swab  Result Value Ref Range Status   SARS Coronavirus 2 by RT PCR NEGATIVE NEGATIVE Final    Comment: (NOTE) SARS-CoV-2 target nucleic acids are NOT DETECTED.  The SARS-CoV-2 RNA is generally detectable in upper respiratory specimens during the acute phase of infection. The lowest concentration of SARS-CoV-2 viral copies this assay can detect is 138 copies/mL. A negative result does not preclude SARS-Cov-2 infection and should not be used as the sole basis for treatment or other patient management decisions. A negative result may occur with  improper specimen collection/handling, submission of specimen other than nasopharyngeal swab, presence of viral mutation(s) within the areas targeted by this assay, and inadequate number of viral copies(<138 copies/mL). A negative result must be combined with clinical observations, patient history, and epidemiological information. The expected result is Negative.  Fact Sheet for Patients:  BloggerCourse.com  Fact Sheet for Healthcare Providers:  SeriousBroker.it  This test is no t yet approved or cleared by the [place] FDA and  has been authorized for detection and/or diagnosis of SARS-CoV-2 by FDA under an Emergency Use Authorization (EUA). This EUA will remain  in effect (meaning this test can be  used) for the duration of the COVID-19 declaration under Section 564(b)(1) of the Act, 21 U.S.C.section 360bbb-3(b)(1), unless the authorization is terminated  or revoked sooner.       Influenza A by PCR NEGATIVE NEGATIVE Final   Influenza B by PCR NEGATIVE NEGATIVE Final    Comment: (NOTE) The Xpert Xpress SARS-CoV-2/FLU/RSV plus assay is intended as an aid in the diagnosis of influenza from Nasopharyngeal swab specimens and should not be used as a sole basis for treatment. Nasal washings and aspirates are unacceptable for Xpert Xpress SARS-CoV-2/FLU/RSV testing.  Fact Sheet for Patients: BloggerCourse.com  Fact Sheet for Healthcare Providers: SeriousBroker.it  This test is not yet approved or cleared by the [place] and  has been authorized for detection and/or diagnosis of SARS-CoV-2 by FDA under an Emergency Use Authorization (EUA). This EUA will remain in effect (meaning this test can be used) for the duration of the COVID-19 declaration under Section 564(b)(1) of the Act, 21 U.S.C. section 360bbb-3(b)(1), unless the authorization is terminated or revoked.     Resp Syncytial Virus by PCR NEGATIVE NEGATIVE Final    Comment: (NOTE) Fact Sheet for Patients: BloggerCourse.com  Fact Sheet for Healthcare Providers: SeriousBroker.it  This test is not yet approved or cleared by the [place] FDA and has been authorized for detection and/or diagnosis of SARS-CoV-2 by FDA under an Emergency Use Authorization (EUA). This EUA will remain in effect (meaning this test can be used) for the duration of the COVID-19 declaration under Section 564(b)(1) of the Act, 21 U.S.C. section 360bbb-3(b)(1), unless the authorization is terminated or revoked.  Performed at [facility], [address], [place], [place] [address]   Culture, blood (routine x 2) Call MD if unable to  obtain prior to antibiotics being given     Status: None   Collection Time: 04/05/2023  4:45 PM   Specimen: BLOOD LEFT HAND  Result Value Ref Range Status   Specimen Description BLOOD LEFT HAND  Final   Special Requests   Final    BOTTLES DRAWN AEROBIC AND ANAEROBIC Blood Culture adequate volume   Culture   Final    NO GROWTH 5 DAYS Performed at [facility], [address], [place], [place] [address]    Report Status 04/18/2023 FINAL  Final  Culture, blood (routine x 2) Call MD if unable to obtain prior to antibiotics being given     Status: None   Collection Time: 04/10/2023 10:17 PM   Specimen: BLOOD LEFT HAND  Result Value Ref Range Status   Specimen Description   Final    BLOOD LEFT HAND Performed at [facility], [address] [place], [place] [address]    Special Requests   Final    BOTTLES DRAWN AEROBIC AND ANAEROBIC Blood Culture results may not be optimal due to an excessive volume of blood received in culture bottles   Culture   Final    NO GROWTH 5 DAYS Performed at [facility], [address], [place], [place] [address]    Report Status 04/18/2023 FINAL  Final  MRSA Next Gen by PCR, Nasal     Status: None   Collection Time: 04/15/23  9:52 AM   Specimen: Nasal Mucosa; Nasal Swab  Result Value Ref Range Status   MRSA by PCR Next Gen NOT DETECTED NOT DETECTED Final    Comment: (NOTE) The GeneXpert MRSA Assay (FDA approved for NASAL specimens only), is one component of a comprehensive MRSA colonization surveillance program. It is not intended to diagnose MRSA infection nor to guide or monitor treatment for MRSA infections. Test performance is not FDA approved in patients less than 71 years old. Performed at [facility], [address], [place], [place] [address]      Radiology Studies: US Venous Img Lower Bilateral (DVT)  Result Date: 04/17/2023 CLINICAL DATA:  The now acute pulmonary embolism. Evaluate for residual DVT. EXAM: BILATERAL LOWER EXTREMITY VENOUS  DOPPLER ULTRASOUND TECHNIQUE: Gray-scale sonography with graded compression, as well as color Doppler and duplex ultrasound were performed to evaluate the lower extremity deep venous systems from the level of the common femoral vein and including the common femoral, femoral, profunda femoral, popliteal and calf veins including the posterior tibial,  peroneal and gastrocnemius veins when visible. The superficial great saphenous vein was also interrogated. Spectral Doppler was utilized to evaluate flow at rest and with distal augmentation maneuvers in the common femoral, femoral and popliteal veins. COMPARISON:  None Available. FINDINGS: RIGHT LOWER EXTREMITY Common Femoral Vein: No evidence of thrombus. Normal compressibility, respiratory phasicity and response to augmentation. Saphenofemoral Junction: No evidence of thrombus. Normal compressibility and flow on color Doppler imaging. Profunda Femoral Vein: No evidence of thrombus. Normal compressibility and flow on color Doppler imaging. Femoral Vein: No evidence of thrombus. Normal compressibility, respiratory phasicity and response to augmentation. Popliteal Vein: No evidence of thrombus. Normal compressibility, respiratory phasicity and response to augmentation. Calf Veins: No evidence of thrombus. Normal compressibility and flow on color Doppler imaging. Superficial Great Saphenous Vein: No evidence of thrombus. Normal compressibility. Venous Reflux:  None. Other Findings:  None. LEFT LOWER EXTREMITY Common Femoral Vein: No evidence of thrombus. Normal compressibility, respiratory phasicity and response to augmentation. Saphenofemoral Junction: No evidence of thrombus. Normal compressibility and flow on color Doppler imaging. Profunda Femoral Vein: No evidence of thrombus. Normal compressibility and flow on color Doppler imaging. Femoral Vein: No evidence of thrombus. Normal compressibility, respiratory phasicity and response to augmentation. Popliteal Vein: No  evidence of thrombus. Normal compressibility, respiratory phasicity and response to augmentation. Calf Veins: No evidence of thrombus. Normal compressibility and flow on color Doppler imaging. Superficial Great Saphenous Vein: No evidence of thrombus. Normal compressibility. Venous Reflux:  None. Other Findings:  None. IMPRESSION: No evidence of deep venous thrombosis in either lower extremity. Electronically Signed   By: Malachy Moan M.D.   On: 04/17/2023 10:48    Scheduled Meds:  apixaban  10 mg Oral BID   Followed by   Melene Muller ON 04/24/2023] apixaban  5 mg Oral BID   calcium-vitamin D  1 tablet Oral BID   Chlorhexidine Gluconate Cloth  6 each Topical Daily   dextromethorphan-guaiFENesin  1 tablet Oral BID   feeding supplement  1 Container Oral QHS   feeding supplement  237 mL Oral BID BM   metoprolol succinate  100 mg Oral Daily   multivitamin with minerals  1 tablet Oral Q lunch   QUEtiapine  25 mg Oral QPC supper   rosuvastatin  20 mg Oral QPM   Continuous Infusions:  ceFEPime (MAXIPIME) IV 2 g (04/18/23 0757)     LOS: 5 days   Critical Care Procedure Note Authorized and Performed by: Maryln Manuel MD  Total Critical Care time:  70 mins Due to a high probability of clinically significant, life threatening deterioration, the patient required my highest level of preparedness to intervene emergently and I personally spent this critical care time directly and personally managing the patient.  This critical care time included obtaining a history; examining the patient, pulse oximetry; ordering and review of studies; arranging urgent treatment with development of a management plan; evaluation of patient's response of treatment; frequent reassessment; and discussions with other providers.  This critical care time was performed to assess and manage the high probability of imminent and life threatening deterioration that could result in multi-organ failure.  It was exclusive of separately  billable procedures and treating other patients and teaching time.    Standley Dakins, MD How to contact the [facility] Attending or Consulting provider 7A - 7P or covering provider during after hours 7P -7A, for this patient?  Check the care team in [facility] and look for a) attending/consulting TRH provider listed and b) the [facility] team listed  Log into [email or web address] and use [facility]'s universal password to access. If you do not have the password, please contact the hospital operator. Locate the [facility] provider you are looking for under [facility] and page to a number that you can be directly reached. If you still have difficulty reaching the provider, please page the [facility] (Director on Call) for the Hospitalists listed on amion for assistance.  04/18/2023, 11:10 AM

## 2023-04-18 NOTE — TOC Progression Note (Signed)
Transition of Care [facility]) - Progression Note    Patient Details  Name: MUHANNAD DENDINGER MRN: [id number] Date of Birth: 12/15/1945  Transition of Care [facility]) CM/SW Contact  Catalina Gravel, [place] Phone Number: 04/18/2023, 11:33 AM  Clinical Narrative:     Pt will transfer to [facility] for Cards treatment/care.  Plan is to DC to [facility] when DC ready.  TOC to follow.   Expected Discharge Plan: Skilled Nursing Facility Barriers to Discharge: Continued Medical Work up  Expected Discharge Plan and Services In-house Referral: Clinical Social Work   Post Acute Care Choice: Skilled Nursing Facility                                         Social Determinants of Health (SDOH) Interventions SDOH Screenings   Food Insecurity: No Food Insecurity (03/22/2023)  Housing: Low Risk  (03/22/2023)  Transportation Needs: No Transportation Needs (03/22/2023)  Utilities: Not At Risk (03/22/2023)  Depression (PHQ2-9): Low Risk  (03/22/2023)  Financial Resource Strain: Low Risk  (03/22/2023)  Tobacco Use: Medium Risk (03/25/2023)    Readmission Risk Interventions    04/14/2023    8:59 AM  Readmission Risk Prevention Plan  Transportation Screening Complete  HRI or Home Care Consult Complete  Social Work Consult for Recovery Care Planning/Counseling Complete  Palliative Care Screening Not Applicable  Medication Review Oceanographer) Complete

## 2023-04-18 NOTE — Significant Event (Signed)
Received call from Dr. Laural Benes re: transfer of Joshua Gentry  Reviewed on arrival 73.1.24 @ bedside 2:07 PM  Discussed with Wife/Son who were present @ Bedside  77 wm independant baseline OSa/CPAP Prior Sarcoma CABG x4 ~ 1998, R renal mass + Robot assit Lap Nephrectomy 04/05/23 of R 6.9 cm Renal mass + nodules  c/w metastatic disease]  Seems like a sub-acute worsening--yesterday was conversant-had a "good" day Usual baseline-Was conversant eventually after surgery--had some delirium in the immediate post-op, but recoivered, went home was doing fair until admit   Lethargic ill appearing WM but rousable wm, ~ stated age--answers yes/no and canot orient to time, does tell me he is at "cone" Poor dentition Sinus with sinus arrhythmia and pvc's seems to be in rt cntrll afib Abd soft, staples on L lower quadrant + 1 LE edema  P Stat CXR--see if needs more diuresis  [if bp will support] Discussed briefly with family situaiton--he has 1 major event [cancer + surgery] but now several other cascading events--afib/? CHF/?PNa and leukemoid reaciton  I subsequently had an extensive discussion with his wife of 53 years-they have 3 children and she always thought "heart attack would take him" he was a smoker for over 40 years 2 packs/day She tells me that Dr. Ronne Binning relayed the surgery was not curative and would give maybe a year or 2 at the most She is surprised at rapid sequence of events-I have explained to her that although rapid, scenario is to be expected-postsurgery hospitalization and multiple exposures to healthcare increases risks  I have asked her to carefully think about full hospice care (they are familiar with this as the patient's wife had her dad on hospice previously )and I am appreciative of our cardiology colleagues who seem to concur with the assessment that the patient is actively dying   I will follow-up with nursing staff to see if they want to start comfort measures, I do not  think he has >1-week of longevity and relayed this to the family   CRITICAL CARE Performed by: Rhetta Mura   Total critical care time: 47 minutes  Critical care time was exclusive of separately billable procedures and treating other patients.  Critical care was necessary to treat or prevent imminent or life-threatening deterioration.  Critical care was time spent personally by me on the following activities: development of treatment plan with patient and/or surrogate as well as nursing, discussions with consultants, evaluation of patient's response to treatment, examination of patient, obtaining history from patient or surrogate, ordering and performing treatments and interventions, ordering and review of laboratory studies, ordering and review of radiographic studies, pulse oximetry and re-evaluation of patient's condition.

## 2023-04-18 NOTE — Progress Notes (Signed)
   04/18/23 0124  Provider Notification  Provider Name/Title Dr. Arville Care  Date Provider Notified 04/18/23  Time Provider Notified 0124  Method of Notification Page  Notification Reason New onset of dysrhythmia (Pt had a 4.6sec. pause. HR now NSR 80-100 with PAC. Denies chest pain or SOB.)  Type of New Onset of Dysrhythmia Other (Comment)  Symptoms of New Onset of Dysrhythmia Asymptomatic  Provider response See new orders  Date of Provider Response 04/18/23  Time of Provider Response [phone]

## 2023-04-18 NOTE — Plan of Care (Signed)
  Problem: Activity: Goal: Ability to tolerate increased activity will improve Outcome: Progressing   Problem: Education: Goal: Knowledge of General Education information will improve Description: Including pain rating scale, medication(s)/side effects and non-pharmacologic comfort measures Outcome: Progressing   

## 2023-04-18 NOTE — IPAL (Signed)
  Interdisciplinary Goals of Care Family Meeting   Date carried out: 04/18/2023  Location of the meeting: Bedside  Member's involved: Physician, Bedside Registered Nurse, Social Worker, Family Member or next of kin, and Other: sons  Durable Power of Pensions consultant or acting medical decision maker: [employer] [wife]    Discussion: We discussed goals of care for [employer] .    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 If able  Will ask palliative care to see additionally  Time spent for the meeting: 30    Rhetta Mura, MD  04/18/2023, 5:23 PM

## 2023-04-18 NOTE — Consult Note (Signed)
Cardiology Consultation   Patient ID: [name] MRN: [id number]; DOB: Nov 16, 1945  Admit date: 04/10/2023 Date of Consult: 04/18/2023  PCP:  Assunta Found, MD   [facility] Providers Cardiologist:  Charlton Haws, MD        Patient Profile:   Joshua Gentry is a 77 y.o. male with a hx of multiple co-morbidities including OSA on CPAP, CAD s/p CABG 1997, has right renal mass RCC with likely pulmonary mets, was admitted to [facility] from SNF with persistent respiratory distress who is being seen 04/18/2023 for the evaluation of Afib/pauses at the request of Dr. Mahala Menghini.  History of Present Illness:   Joshua Gentry was diagnosed with suspected RCC and underwent right robotic assisted laparoscopic radical nephrectomy 04/05/2023. He had acute resp failure post-op; PNA. Treated with antibiotics. He was re-admitted recently for persistent PNA was started on IV vanc/cefepime. Had CT showing RUL PE. Also showed mets c/f metastatic RCC. He has some sinus pauses and afib with RVR. He was transferred here from [facility] for this. Cardiology was consulted to assist with rhythm/rate control.   On 8L HFNC MAPS are normal, not in shock Rates up to 140s; mostly rate controlled Crt stable has been 1.4-1.8 WBC 90s CT- lung nodules c/f mets VQ scan- RUL PE TSH is normal Normal LFTs BNP 3,483  At the bedside, he is mouth breathing, decreased respiratory rate. Wife and son is by the bedside.   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 s/p CABG 1997.   Heart attack (HCC)    Hyperlipidemia    Hypertension    Obesity (BMI 30-39.9)    OSA on CPAP 10/16/2014   Compliant   RBBB with left anterior fascicular block    Renal insufficiency     Past Surgical History:  Procedure Laterality Date   COLONOSCOPY N/A 08/01/2013   Procedure: COLONOSCOPY;  Surgeon: Dalia Heading, MD;  Location: [facility];   Service: Gastroenterology;  Laterality: N/A;   CORONARY ARTERY BYPASS GRAFT     x4   Lens Bilateral April 2016   Replacment    LOWER LEG SOFT TISSUE TUMOR EXCISION     ROBOT ASSISTED LAPAROSCOPIC NEPHRECTOMY Right 04/05/2023   Procedure: XI ROBOTIC ASSISTED LAPAROSCOPIC NEPHRECTOMY;  Surgeon: Malen Gauze, MD;  Location: [facility] ORS;  Service: Urology;  Laterality: Right;     Home Medications:  Prior to Admission medications   Medication Sig Start Date End Date Taking? Authorizing Provider  calcium-vitamin D 250-100 MG-UNIT per tablet Take 1 tablet by mouth 2 (two) times daily.   Yes [provider]  metoprolol succinate (TOPROL XL) 100 MG 24 hr tablet Take 1 tablet (100 mg total) by mouth daily. Take with or immediately following a meal. 01/22/23  Yes Strader, [place] M, PA-C  Multiple Vitamins-Minerals (MULTIVITAMIN WITH MINERALS) tablet Take 1 tablet by mouth daily.   Yes [provider]  nitroGLYCERIN (NITROSTAT) 0.4 MG SL tablet DISSOLVE 1 TABLET UNDER THE TONGUE EVERY 5 MINUTES AS NEEDED FOR CHEST PAIN, DO NOT EXCEED A TOTAL OF 3 DOSES IN 15 MINUTES 11/06/22  Yes Wendall Stade, MD  QUEtiapine (SEROQUEL) 25 MG tablet Take 1 tablet (25 mg total) by mouth at bedtime. 04/12/23  Yes McKenzie, Mardene Celeste, MD  rosuvastatin (CRESTOR) 20 MG tablet TAKE 1 TABLET(20 MG) BY MOUTH DAILY Patient taking differently: Take 20 mg by  mouth daily. 12/24/22  Yes BranchDorothe Pea, MD  spironolactone (ALDACTONE) 25 MG tablet Take 25 mg by mouth daily. 03/25/23  Yes [provider]    Inpatient Medications: Scheduled Meds:  apixaban  10 mg Oral BID   Followed by   Melene Muller ON 04/24/2023] apixaban  5 mg Oral BID   calcium-vitamin D  1 tablet Oral BID   Chlorhexidine Gluconate Cloth  6 each Topical Daily   dextromethorphan-guaiFENesin  1 tablet Oral BID   feeding supplement  1 Container Oral QHS   feeding supplement  237 mL Oral BID BM   metoprolol succinate  100 mg Oral Daily    multivitamin with minerals  1 tablet Oral Q lunch   QUEtiapine  25 mg Oral QPC supper   rosuvastatin  20 mg Oral QPM   Continuous Infusions:  ceFEPime (MAXIPIME) IV Stopped (04/18/23 0827)   PRN Meds: acetaminophen **OR** acetaminophen, haloperidol lactate, ipratropium-albuterol, LORazepam, ondansetron **OR** ondansetron (ZOFRAN) IV  Allergies:    Allergies  Allergen Reactions   Fire Ant Anaphylaxis   Niacin    Penicillins     Patient can't recall reaction- happened a long time ago   Tape Hives   Wound Dressing Adhesive Rash    Social History:   Social History   Socioeconomic History   Marital status: Married    Spouse name: Not on file   Number of children: 3   Years of education: College   Highest education level: Not on file  Occupational History   Occupation: Quarry manager  Tobacco Use   Smoking status: Former    Current packs/day: 0.00    Average packs/day: 1.5 packs/day for 32.0 years (48.0 ttl pk-yrs)    Types: Cigarettes    Start date: 08/17/1964    Quit date: 08/17/1996    Years since quitting: 26.6   Smokeless tobacco: Never  Vaping Use   Vaping status: Never Used  Substance and Sexual Activity   Alcohol use: Not Currently    Comment: Occasional    Drug use: No   Sexual activity: Not on file  Other Topics Concern   Not on file  Social History Narrative   Married with 3 children   No regular exercise   Drinks about 48oz of coke a day    Social Determinants of Health   Financial Resource Strain: Low Risk  (03/22/2023)   Overall Financial Resource Strain (CARDIA)    Difficulty of Paying Living Expenses: Not hard at all  Food Insecurity: No Food Insecurity (03/22/2023)   Hunger Vital Sign    Worried About Running Out of Food in the Last Year: Never true    Ran Out of Food in the Last Year: Never true  Transportation Needs: No Transportation Needs (03/22/2023)   PRAPARE - Administrator, Civil Service (Medical): No    Lack of  Transportation (Non-Medical): No  Physical Activity: Not on file  Stress: Not on file  Social Connections: Not on file  Intimate Partner Violence: Not At Risk (03/22/2023)   Humiliation, Afraid, Rape, and Kick questionnaire    Fear of Current or Ex-Partner: No    Emotionally Abused: No    Physically Abused: No    Sexually Abused: No    Family History:    Family History  Problem Relation Age of Onset   Hypertension Mother    Diabetes Mother    Heart attack Father    Diabetes Brother      ROS:  Please  see the history of present illness.   All other ROS reviewed and negative.     Physical Exam/Data:   Vitals:   04/18/23 1100 04/18/23 1135 04/18/23 1214 04/18/23 1316  BP: (!) 113/95   114/73  Pulse: 99   93  Resp: (!) 33   (!) 30  Temp:  97.6 F (36.4 C)  97.9 F (36.6 C)  TempSrc:  Axillary  Oral  SpO2: 91%  92% 93%  Weight:      Height:        Intake/Output Summary (Last 24 hours) at 04/18/2023 1325 Last data filed at 04/18/2023 1230 Gross per 24 hour  Intake 640 ml  Output 400 ml  Net 240 ml      04/18/2023    4:38 AM 04/17/2023    5:13 AM 04/16/2023    5:00 AM  Last 3 Weights  Weight (lbs) 230 lb 9.6 oz 235 lb 3.7 oz 216 lb 4.3 oz  Weight (kg) 104.6 kg 106.7 kg 98.1 kg     Body mass index is 29.61 kg/m.  General:  not alert or oriented HEENT: normal Cardiac:  normal S1, S2; IRRR; no murmur  Lungs:  mouth breathing, decreased RR Abd: soft, nontender, no hepatomegaly  Ext: no edema Musculoskeletal:  BL 1+ edema Skin: warm and dry  Neuro:  CNs 2-12 intact, no focal abnormalities noted Psych:  Normal affect   EKG:  The EKG was personally reviewed and demonstrates:  Afib with RVR, inferior infarct pattern, LAD  Telemetry:  Telemetry was personally reviewed and demonstrates:  afib rates controlled  Relevant CV Studies: TTE 04/14/2023 1. Left ventricular ejection fraction, by estimation, is 70 to 75%. The  left ventricle has hyperdynamic function. The left  ventricle has no  regional wall motion abnormalities. Left ventricular diastolic parameters  are consistent with Grade I diastolic  dysfunction (impaired relaxation).   2. Right ventricular systolic function is normal. The right ventricular  size is normal. Tricuspid regurgitation signal is inadequate for assessing  PA pressure.   3. The mitral valve is normal in structure. No evidence of mitral valve  regurgitation. No evidence of mitral stenosis.   4. The aortic valve has an indeterminant number of cusps. Aortic valve  regurgitation is not visualized. No aortic stenosis is present.    Laboratory Data:  High Sensitivity Troponin:  No results for input(s): "TROPONINIHS" in the last 720 hours.   Chemistry Recent Labs  Lab 04/14/23 0552 04/16/23 0324 04/16/23 0749 04/18/23 0149  NA 138 140 139 138  K 3.8 3.0* 3.8 3.3*  CL 106 108 108 108  CO2 24 22 27 22   GLUCOSE 145* 46* 117* 118*  BUN 28* 28* 27* 26*  CREATININE 1.83* 1.59* 1.64* 1.64*  CALCIUM 10.7* 9.6 9.5 9.0  MG 2.3  --  2.1 2.0  GFRNONAA 38* 45* 43* 43*  ANIONGAP 8 10 4* 8    Recent Labs  Lab 04/12/23 0454 03/18/2023 1643 04/14/23 0552  PROT 5.2* 5.8* 5.5*  ALBUMIN 2.3* 2.5* 2.4*  AST 17 19 19   ALT 17 18 16   ALKPHOS 227* 290* 280*  BILITOT 1.0 1.1 1.1   Lipids No results for input(s): "CHOL", "TRIG", "HDL", "LABVLDL", "LDLCALC", "CHOLHDL" in the last 168 hours.  Hematology Recent Labs  Lab 04/16/23 0324 04/17/23 0354 04/18/23 0149  WBC 102.0* 111.6* 92.8*  RBC 3.86* 3.77* 3.40*  HGB 10.2* 10.2* 9.0*  HCT 34.0* 33.9* 29.5*  MCV 88.1 89.9 86.8  MCH 26.4 27.1 26.5  MCHC 30.0 30.1 30.5  RDW 17.0* 17.1* 17.2*  PLT 207 219 190   Thyroid No results for input(s): "TSH", "FREET4" in the last 168 hours.  BNP Recent Labs  Lab 04/17/2023 1643  BNP 3,483.0*    DDimer  Recent Labs  Lab 04/10/2023 2140  DDIMER 1.83*     Radiology/Studies:  US Venous Img Lower Bilateral (DVT)  Result Date:  04/17/2023 CLINICAL DATA:  The now acute pulmonary embolism. Evaluate for residual DVT. EXAM: BILATERAL LOWER EXTREMITY VENOUS DOPPLER ULTRASOUND TECHNIQUE: Gray-scale sonography with graded compression, as well as color Doppler and duplex ultrasound were performed to evaluate the lower extremity deep venous systems from the level of the common femoral vein and including the common femoral, femoral, profunda femoral, popliteal and calf veins including the posterior tibial, peroneal and gastrocnemius veins when visible. The superficial great saphenous vein was also interrogated. Spectral Doppler was utilized to evaluate flow at rest and with distal augmentation maneuvers in the common femoral, femoral and popliteal veins. COMPARISON:  None Available. FINDINGS: RIGHT LOWER EXTREMITY Common Femoral Vein: No evidence of thrombus. Normal compressibility, respiratory phasicity and response to augmentation. Saphenofemoral Junction: No evidence of thrombus. Normal compressibility and flow on color Doppler imaging. Profunda Femoral Vein: No evidence of thrombus. Normal compressibility and flow on color Doppler imaging. Femoral Vein: No evidence of thrombus. Normal compressibility, respiratory phasicity and response to augmentation. Popliteal Vein: No evidence of thrombus. Normal compressibility, respiratory phasicity and response to augmentation. Calf Veins: No evidence of thrombus. Normal compressibility and flow on color Doppler imaging. Superficial Great Saphenous Vein: No evidence of thrombus. Normal compressibility. Venous Reflux:  None. Other Findings:  None. LEFT LOWER EXTREMITY Common Femoral Vein: No evidence of thrombus. Normal compressibility, respiratory phasicity and response to augmentation. Saphenofemoral Junction: No evidence of thrombus. Normal compressibility and flow on color Doppler imaging. Profunda Femoral Vein: No evidence of thrombus. Normal compressibility and flow on color Doppler imaging. Femoral  Vein: No evidence of thrombus. Normal compressibility, respiratory phasicity and response to augmentation. Popliteal Vein: No evidence of thrombus. Normal compressibility, respiratory phasicity and response to augmentation. Calf Veins: No evidence of thrombus. Normal compressibility and flow on color Doppler imaging. Superficial Great Saphenous Vein: No evidence of thrombus. Normal compressibility. Venous Reflux:  None. Other Findings:  None. IMPRESSION: No evidence of deep venous thrombosis in either lower extremity. Electronically Signed   By: Malachy Moan M.D.   On: 04/17/2023 10:48   NM Pulmonary Perfusion  Result Date: 04/16/2023 CLINICAL DATA:  Pulmonary embolism suspected, high probability. Right nephrectomy for renal cell carcinoma 04/05/2023 EXAM: NUCLEAR MEDICINE PERFUSION LUNG SCAN TECHNIQUE: Perfusion images were obtained in multiple projections after intravenous injection of radiopharmaceutical. Ventilation scans intentionally deferred if perfusion scan and chest x-ray adequate for interpretation during COVID 19 epidemic. RADIOPHARMACEUTICALS:  4.3 mCi Tc-19m MAA IV COMPARISON:  One-view chest x-ray 03/28/2023 FINDINGS: Wedge shaped segmental perfusion defects are present in the right upper lobe. Patchy filling defects on the left may be secondary to the metastases. IMPRESSION: Wedge-shaped perfusion defects in the right upper lobe consistent with pulmonary emboli. These results will be called to the ordering clinician or representative by the Radiologist Assistant, and communication documented in the PACS or [employer]. Electronically Signed   By: Marin Roberts M.D.   On: 04/16/2023 14:45   ECHOCARDIOGRAM COMPLETE  Result Date: 04/14/2023    ECHOCARDIOGRAM REPORT   Patient Name:   BRANTLEY ROCHEFORT Date of Exam: 04/14/2023 Medical Rec #:  [id number]  Height:       74.0 in Accession #:    [id number]       Weight:       242.9 lb Date of Birth:  08-Jun-1946        BSA:           2.361 m Patient Age:    77 years         BP:           117/57 mmHg Patient Gender: M                HR:           85 bpm. [facility] Procedure: 2D Echo, Cardiac Doppler, Color Doppler and Intracardiac            Opacification Agent Indications:    CHF-Acute Systolic I50.21  History:        Patient has no prior history of Echocardiogram examinations.                 Prior CABG, COPD, Arrythmias:RBBB; Risk Factors:Hypertension and                 Dyslipidemia.  Sonographer:    Celesta Gentile RCS Referring Phys: [id number] OLADAPO ADEFESO IMPRESSIONS  1. Left ventricular ejection fraction, by estimation, is 70 to 75%. The left ventricle has hyperdynamic function. The left ventricle has no regional wall motion abnormalities. Left ventricular diastolic parameters are consistent with Grade I diastolic dysfunction (impaired relaxation).  2. Right ventricular systolic function is normal. The right ventricular size is normal. Tricuspid regurgitation signal is inadequate for assessing PA pressure.  3. The mitral valve is normal in structure. No evidence of mitral valve regurgitation. No evidence of mitral stenosis.  4. The aortic valve has an indeterminant number of cusps. Aortic valve regurgitation is not visualized. No aortic stenosis is present. FINDINGS  Left Ventricle: Left ventricular ejection fraction, by estimation, is 70 to 75%. The left ventricle has hyperdynamic function. The left ventricle has no regional wall motion abnormalities. Definity contrast agent was given IV to delineate the left ventricular endocardial borders. The left ventricular internal cavity size was normal in size. There is no left ventricular hypertrophy. Left ventricular diastolic parameters are consistent with Grade I diastolic dysfunction (impaired relaxation). Normal  left ventricular filling pressure. Right Ventricle: The right ventricular size is normal. Right vetricular wall thickness was not well visualized. Right  ventricular systolic function is normal. Tricuspid regurgitation signal is inadequate for assessing PA pressure. Left Atrium: Left atrial size was normal in size. Right Atrium: Right atrial size was normal in size. Pericardium: There is no evidence of pericardial effusion. Mitral Valve: The mitral valve is normal in structure. No evidence of mitral valve regurgitation. No evidence of mitral valve stenosis. Tricuspid Valve: The tricuspid valve is normal in structure. Tricuspid valve regurgitation is not demonstrated. No evidence of tricuspid stenosis. Aortic Valve: The aortic valve has an indeterminant number of cusps. Aortic valve regurgitation is not visualized. No aortic stenosis is present. Aortic valve mean gradient measures 6.0 mmHg. Aortic valve peak gradient measures 12.5 mmHg. Aortic valve area, by VTI measures 2.71 cm. Pulmonic Valve: The pulmonic valve was not well visualized. Pulmonic valve regurgitation is not visualized. No evidence of pulmonic stenosis. Aorta: The aortic root is normal in size and structure. Venous: The inferior vena cava was not well visualized. IAS/Shunts: The interatrial septum was not well visualized.  LEFT VENTRICLE PLAX 2D LVIDd:  5.00 cm   Diastology LVIDs:         2.80 cm   LV e' medial:    5.55 cm/s LV PW:         1.10 cm   LV E/e' medial:  11.8 LV IVS:        1.00 cm   LV e' lateral:   10.00 cm/s LVOT diam:     2.00 cm   LV E/e' lateral: 6.6 LV SV:         89 LV SV Index:   38 LVOT Area:     3.14 cm  RIGHT VENTRICLE RV S prime:     15.40 cm/s TAPSE (M-mode): 1.4 cm LEFT ATRIUM           Index        RIGHT ATRIUM           Index LA diam:      4.35 cm 1.84 cm/m   RA Area:     17.90 cm LA Vol (A2C): 47.0 ml 19.91 ml/m  RA Volume:   47.00 ml  19.91 ml/m LA Vol (A4C): 69.9 ml 29.61 ml/m  AORTIC VALVE AV Area (Vmax):    2.81 cm AV Area (Vmean):   2.78 cm AV Area (VTI):     2.71 cm AV Vmax:           176.77 cm/s AV Vmean:          113.616 cm/s AV VTI:             0.328 m AV Peak Grad:      12.5 mmHg AV Mean Grad:      6.0 mmHg LVOT Vmax:         158.00 cm/s LVOT Vmean:        100.367 cm/s LVOT VTI:          0.283 m LVOT/AV VTI ratio: 0.86  AORTA Ao Root diam: 3.20 cm MITRAL VALVE MV Area (PHT): 1.46 cm    SHUNTS MV Decel Time: 521 msec    Systemic VTI:  0.28 m MV E velocity: 65.60 cm/s  Systemic Diam: 2.00 cm MV A velocity: 87.80 cm/s MV E/A ratio:  0.75 Dina Rich MD Electronically signed by Dina Rich MD Signature Date/Time: 04/14/2023/3:53:35 PM    Final      Assessment and Plan:   Afib RVR/ pauses - noted sinus pauses at [facility], none here. So far rates are controlled  w/o BB. Can give IV amiodarone 150 mg x2 if HR >130.  Can do an amiodarone gtt if rates persistently elevated >130 bpm - with metastatic disease, significant respiratory failure; I discussed with the wife that his clinical status appears dire. He is DNR. We discussed keeping him comfortable. If O2 requirement increases can give IV diuretic. Can continue AC. No plans for DCCV. Will follow peripherally over the holiday  Primary Team PNA PE Metastatic RCC Palliative  Risk Assessment/Risk Scores:       For questions or updates, please contact [facility] Please consult [email or web address] for contact info under    Signed, Maisie Fus, MD  04/18/2023 1:25 PM

## 2023-04-18 NOTE — Progress Notes (Signed)
   04/18/23 1956  BiPAP/CPAP/SIPAP  BiPAP/CPAP/SIPAP Pt Type Adult  BiPAP/CPAP/SIPAP Resmed  Mask Type Full face mask  Mask Size Large  Respiratory Rate 27 breaths/min  EPAP 10 cmH2O  Flow Rate 6 lpm  Patient Home Equipment No  Auto Titrate No  BiPAP/CPAP /SiPAP Vitals  Pulse Rate 87  Resp (!) 27  SpO2 (!) 84 %  Bilateral Breath Sounds Diminished  MEWS Score/Color  MEWS Score 2  MEWS Score Color Yellow

## 2023-04-18 NOTE — Progress Notes (Addendum)
Critical care note:  Date of note: 04/18/2023  Subjective: The patient had a 4.6-second pause on monitor with a BP of 98/54 later heart rate was 8200.  Atrial fibrillation and occasional sinus rhythm with PACs.  Respiratory rate was 18 and O2 sat was 95% on 5 L O2 by nasal cannula.  A negative drop test was completely-89% on room air which got up to 95% to 98% on 100% nonrebreather.  He was not having any chest pain or headache or dizziness or blurred vision.  He was placed on pacer pads.  Initially became confused and agitated pulling IV and not released.  He was placed on hospice and nonrebreather with mild improvement in his mental status.  Objective: Physical examination: Generally: Acutely ill elderly Caucasian male, respiratory distress with mild tachypnea and conversational dyspnea.  He was alert and oriented to place, person, was able to tell me the year but not day of the month.  He was fairly somnolent but arousable. Vital signs per history of present illness. Head - atraumatic, normocephalic.  Pupils - equal, round and reactive to light and accommodation. Extraocular movements are intact. No scleral icterus.  Oropharynx - moist mucous membranes and tongue. No pharyngeal erythema or exudate.  Neck - supple. No JVD. Carotid pulses 2+ bilaterally. No carotid bruits. No palpable thyromegaly or lymphadenopathy. Cardiovascular - regular rate and rhythm. Normal S1 and S2. No murmurs, gallops or rubs.  Lungs --diminished bibasal breath sounds on the right with mild bibasal crackles. Abdomen - soft and nontender. Positive bowel sounds. No palpable organomegaly or masses.  Extremities - no pitting edema, clubbing or cyanosis.  Neuro - grossly non-focal. Skin - no rashes. GU and rectal exam - deferred.  Labs and notes reviewed.  BMP and magnesium were ordered.  Assessment/plan: 1.  Sinus pauses. - The patient had pacer pads placed. - We will check magnesium and calcium level. - We will  hold off blocker therapy. - He will benefit from cardiology consultation in AM.  2.Acute respiratory failure with hypoxia secondary to multifactorial, improving Not on oxygen supplementation at baseline.  He is now requiring 100% nonrebreather given his tachypnea and respiratory distress. Continue to treat underlying conditions, pneumonia, OSA, physical deconditioning Continue cefepime and IV vancomycin, obtain MRSA screening test if negative DC IV vancomycin. Continue CPAP nightly Continue PT OT with assistance and fall precautions Continue out of bed to chair as tolerated Maintain O2 saturation above 92% Continue to wean off oxygen supplementation as able.  3.  Acute encephalopathy with agitation. - We will place on as needed IV Ativan and if need be IM Haldol cautiously.  Authorized and performed by: Valente David, MD Total critical care time:   30     minutes. Due to a high probability of clinically significant, life-threatening deterioration, the patient required my highest level of preparedness to intervene emergently and I personally spent this critical care time directly and personally managing the patient.  This critical care time included obtaining a history, examining the patient, pulse oximetry, ordering and review of studies, arranging urgent treatment with development of management plan, evaluation of patient's response to treatment, frequent reassessment, and discussions with other providers. This critical care time was performed to assess and manage the high probability of imminent, life-threatening deterioration that could result in multiorgan failure.  It was exclusive of separately billable procedures and treating other patients and teaching time.

## 2023-04-18 DEATH — deceased

## 2023-04-19 DIAGNOSIS — Z7189 Other specified counseling: Secondary | ICD-10-CM | POA: Diagnosis not present

## 2023-04-19 DIAGNOSIS — I480 Paroxysmal atrial fibrillation: Secondary | ICD-10-CM | POA: Diagnosis not present

## 2023-04-19 DIAGNOSIS — Z515 Encounter for palliative care: Secondary | ICD-10-CM | POA: Diagnosis not present

## 2023-04-19 DIAGNOSIS — J9601 Acute respiratory failure with hypoxia: Secondary | ICD-10-CM | POA: Diagnosis not present

## 2023-04-19 LAB — COMPREHENSIVE METABOLIC PANEL
ALT: 23 U/L (ref 0–44)
AST: 27 U/L (ref 15–41)
Albumin: 1.8 g/dL — ABNORMAL LOW (ref 3.5–5.0)
Alkaline Phosphatase: 306 U/L — ABNORMAL HIGH (ref 38–126)
Anion gap: 7 (ref 5–15)
BUN: 27 mg/dL — ABNORMAL HIGH (ref 8–23)
CO2: 19 mmol/L — ABNORMAL LOW (ref 22–32)
Calcium: 9.5 mg/dL (ref 8.9–10.3)
Chloride: 113 mmol/L — ABNORMAL HIGH (ref 98–111)
Creatinine, Ser: 1.87 mg/dL — ABNORMAL HIGH (ref 0.61–1.24)
GFR, Estimated: 37 mL/min — ABNORMAL LOW (ref >60)
Glucose, Bld: 79 mg/dL (ref 70–99)
Potassium: 4.2 mmol/L (ref 3.5–5.1)
Sodium: 139 mmol/L (ref 135–145)
Total Bilirubin: 0.6 mg/dL (ref 0.3–1.2)
Total Protein: 4.9 g/dL — ABNORMAL LOW (ref 6.5–8.1)

## 2023-04-19 LAB — CBC WITH DIFFERENTIAL/PLATELET
Abs Immature Granulocytes: 0 10*3/uL (ref 0.00–0.07)
Basophils Absolute: 0 10*3/uL (ref 0.0–0.1)
Basophils Relative: 0 %
Eosinophils Absolute: 0.9 10*3/uL — ABNORMAL HIGH (ref 0.0–0.5)
Eosinophils Relative: 1 %
HCT: 30.3 % — ABNORMAL LOW (ref 39.0–52.0)
Hemoglobin: 8.9 g/dL — ABNORMAL LOW (ref 13.0–17.0)
Lymphocytes Relative: 0 %
Lymphs Abs: 0 10*3/uL — ABNORMAL LOW (ref 0.7–4.0)
MCH: 26.9 pg (ref 26.0–34.0)
MCHC: 29.4 g/dL — ABNORMAL LOW (ref 30.0–36.0)
MCV: 91.5 fL (ref 80.0–100.0)
Monocytes Absolute: 1.7 10*3/uL — ABNORMAL HIGH (ref 0.1–1.0)
Monocytes Relative: 2 %
Neutro Abs: 83.7 10*3/uL — ABNORMAL HIGH (ref 1.7–7.7)
Neutrophils Relative %: 97 %
Platelets: 207 10*3/uL (ref 150–400)
RBC: 3.31 MIL/uL — ABNORMAL LOW (ref 4.22–5.81)
RDW: 17.1 % — ABNORMAL HIGH (ref 11.5–15.5)
WBC: 86.3 10*3/uL (ref 4.0–10.5)
nRBC: 0 % (ref 0.0–0.2)
nRBC: 0 /100{WBCs}

## 2023-04-19 LAB — LEGIONELLA PNEUMOPHILA SEROGP 1 UR AG: L. pneumophila Serogp 1 Ur Ag: NEGATIVE

## 2023-04-19 LAB — PTH-RELATED PEPTIDE: PTH-related peptide: <2 pmol/L

## 2023-04-19 MED ORDER — GLYCOPYRROLATE 0.2 MG/ML IJ SOLN: 0.2000 mg | INTRAMUSCULAR | Status: DC | PRN

## 2023-04-19 MED ORDER — GLYCOPYRROLATE 0.2 MG/ML IJ SOLN
0.2000 mg | INTRAMUSCULAR | Status: DC | PRN
  Administered 2023-04-20: 0.2 mg via INTRAVENOUS
  Filled 2023-04-19: qty 1

## 2023-04-19 MED ORDER — LORAZEPAM 2 MG/ML IJ SOLN: 0.5000 mg | INTRAMUSCULAR | Status: DC | PRN

## 2023-04-19 MED ORDER — HYDROMORPHONE HCL 1 MG/ML IJ SOLN: 0.5000 mg | INTRAMUSCULAR | Status: DC | PRN

## 2023-04-19 MED ORDER — BIOTENE DRY MOUTH MT LIQD: 15.0000 mL | OROMUCOSAL | Status: DC | PRN

## 2023-04-19 MED ORDER — POLYVINYL ALCOHOL 1.4 % OP SOLN: 1.0000 [drp] | Freq: Four times a day (QID) | OPHTHALMIC | Status: DC | PRN

## 2023-04-19 MED ORDER — HYDROMORPHONE BOLUS VIA INFUSION
0.5000 mg | INTRAVENOUS | Status: DC | PRN
  Administered 2023-04-20 (×2): 0.5 mg via INTRAVENOUS

## 2023-04-19 MED ORDER — GLYCOPYRROLATE 1 MG PO TABS: 1.0000 mg | ORAL_TABLET | ORAL | Status: DC | PRN

## 2023-04-19 MED ORDER — DM-GUAIFENESIN ER 30-600 MG PO TB12: 1.0000 | ORAL_TABLET | Freq: Two times a day (BID) | ORAL | Status: DC | PRN

## 2023-04-19 MED ORDER — HYDROMORPHONE HCL-NACL 50-0.9 MG/50ML-% IV SOLN
1.0000 mg/h | INTRAVENOUS | Status: DC
  Administered 2023-04-19: 1 mg/h via INTRAVENOUS
  Filled 2023-04-19: qty 50

## 2023-04-19 NOTE — Progress Notes (Signed)
Subjective:   Dyspnea unchanged   Objective:  Vitals:   04/19/23 0009 04/19/23 0400 04/19/23 0439 04/19/23 0806  BP: (!) 83/58 (!) 107/51  (!) 131/49  Pulse: 85 81  87  Resp: (!) 27 (!) 24  (!) 24  Temp: 98 F (36.7 C) 97.8 F (36.6 C)  (!) 97.5 F (36.4 C)  TempSrc: Axillary Axillary  Axillary  SpO2: 94% 93%  93%  Weight:   108.1 kg   Height:        Intake/Output from previous day:  Intake/Output Summary (Last 24 hours) at 04/19/2023 0810 Last data filed at 04/19/2023 0532 Gross per 24 hour  Intake 200 ml  Output 650 ml  Net -450 ml    Physical Exam:  Chronically ill Oxygen Prior lap nephrectomy BS positive Rhonchi bilateral/diffuse No murmur  Trace edema  Lab Results: Basic Metabolic Panel: Recent Labs    04/18/23 0149 04/19/23 0237  NA 138 139  K 3.3* 4.2  CL 108 113*  CO2 22 19*  GLUCOSE 118* 79  BUN 26* 27*  CREATININE 1.64* 1.87*  CALCIUM 9.0 9.5  MG 2.0  --    Liver Function Tests: Recent Labs    04/19/23 0237  AST 27  ALT 23  ALKPHOS 306*  BILITOT 0.6  PROT 4.9*  ALBUMIN 1.8*   No results for input(s): "LIPASE", "AMYLASE" in the last 72 hours. CBC: Recent Labs    04/18/23 0149 04/19/23 0237  WBC 92.8* 86.3*  NEUTROABS 84.0* 83.7*  HGB 9.0* 8.9*  HCT 29.5* 30.3*  MCV 86.8 91.5  PLT 190 207    Imaging: DG CHEST PORT 1 VIEW  Result Date: 04/18/2023 CLINICAL DATA:  Pneumonia. EXAM: PORTABLE CHEST 1 VIEW COMPARISON:  Radiographs 03/29/2023 and 04/05/2023.  CT 03/16/2023. FINDINGS: 1402 hours. The heart size and mediastinal contours are stable status post median sternotomy and CABG. Extensive pulmonary nodularity is again noted bilaterally, suspicious for metastatic disease on recent CT. Confluent opacities at both lung bases are similar to the recent prior studies and may reflect superimposed pneumonia, atelectasis or progressive metastatic disease. No pneumothorax or significant pleural effusion. The bones appear unchanged.  Telemetry leads overlie the chest. IMPRESSION: 1. No significant change in extensive bilateral pulmonary nodularity, suspicious for metastatic disease. 2. Confluent opacities at both lung bases may reflect superimposed pneumonia, atelectasis or progressive metastatic disease, similar to the recent radiographs. Electronically Signed   By: Carey Bullocks M.D.   On: 04/18/2023 15:17   US Venous Img Lower Bilateral (DVT)  Result Date: 04/17/2023 CLINICAL DATA:  The now acute pulmonary embolism. Evaluate for residual DVT. EXAM: BILATERAL LOWER EXTREMITY VENOUS DOPPLER ULTRASOUND TECHNIQUE: Gray-scale sonography with graded compression, as well as color Doppler and duplex ultrasound were performed to evaluate the lower extremity deep venous systems from the level of the common femoral vein and including the common femoral, femoral, profunda femoral, popliteal and calf veins including the posterior tibial, peroneal and gastrocnemius veins when visible. The superficial great saphenous vein was also interrogated. Spectral Doppler was utilized to evaluate flow at rest and with distal augmentation maneuvers in the common femoral, femoral and popliteal veins. COMPARISON:  None Available. FINDINGS: RIGHT LOWER EXTREMITY Common Femoral Vein: No evidence of thrombus. Normal compressibility, respiratory phasicity and response to augmentation. Saphenofemoral Junction: No evidence of thrombus. Normal compressibility and flow on color Doppler imaging. Profunda Femoral Vein: No evidence of thrombus. Normal compressibility and flow on color Doppler imaging. Femoral Vein: No evidence of thrombus. Normal  compressibility, respiratory phasicity and response to augmentation. Popliteal Vein: No evidence of thrombus. Normal compressibility, respiratory phasicity and response to augmentation. Calf Veins: No evidence of thrombus. Normal compressibility and flow on color Doppler imaging. Superficial Great Saphenous Vein: No evidence of  thrombus. Normal compressibility. Venous Reflux:  None. Other Findings:  None. LEFT LOWER EXTREMITY Common Femoral Vein: No evidence of thrombus. Normal compressibility, respiratory phasicity and response to augmentation. Saphenofemoral Junction: No evidence of thrombus. Normal compressibility and flow on color Doppler imaging. Profunda Femoral Vein: No evidence of thrombus. Normal compressibility and flow on color Doppler imaging. Femoral Vein: No evidence of thrombus. Normal compressibility, respiratory phasicity and response to augmentation. Popliteal Vein: No evidence of thrombus. Normal compressibility, respiratory phasicity and response to augmentation. Calf Veins: No evidence of thrombus. Normal compressibility and flow on color Doppler imaging. Superficial Great Saphenous Vein: No evidence of thrombus. Normal compressibility. Venous Reflux:  None. Other Findings:  None. IMPRESSION: No evidence of deep venous thrombosis in either lower extremity. Electronically Signed   By: Malachy Moan M.D.   On: 04/17/2023 10:48    Cardiac Studies:  ECG: afib RBBB   Telemetry: afib rates 80-90  Echo: EF 70-75%  Medications:    apixaban  10 mg Oral BID   Followed by   Melene Muller ON 04/24/2023] apixaban  5 mg Oral BID   calcium-vitamin D  1 tablet Oral BID   Chlorhexidine Gluconate Cloth  6 each Topical Daily   dextromethorphan-guaiFENesin  1 tablet Oral BID   feeding supplement  1 Container Oral QHS   feeding supplement  237 mL Oral BID BM   metoprolol succinate  100 mg Oral Daily   multivitamin with minerals  1 tablet Oral Q lunch   QUEtiapine  25 mg Oral QPC supper   rosuvastatin  20 mg Oral QPM      ceFEPime (MAXIPIME) IV Stopped (04/18/23 2133)    Assessment/Plan:   PAF with sinus pauses. Elderly male with multiple co morbidities  renal mass with pulmonary mets DNR Continue eliquis PE dosingin 10 mg bid rate control is fine at this time with Toprol 100 mg ne intentions to convert given age,  cancer, DNR status PE/Pneumonia continue DOAC and cefepime CABG:  distant no chest pain continue statin and beta blocker  Charlton Haws 04/19/2023, 8:10 AM

## 2023-04-19 NOTE — Plan of Care (Signed)
  Problem: Activity: Goal: Ability to tolerate increased activity will improve Outcome: Progressing   Problem: Clinical Measurements: Goal: Ability to maintain a body temperature in the normal range will improve Outcome: Progressing   Problem: Respiratory: Goal: Ability to maintain adequate ventilation will improve Outcome: Progressing Goal: Ability to maintain a clear airway will improve Outcome: Progressing   Problem: Education: Goal: Knowledge of General Education information will improve Description: Including pain rating scale, medication(s)/side effects and non-pharmacologic comfort measures Outcome: Progressing   Problem: Health Behavior/Discharge Planning: Goal: Ability to manage health-related needs will improve Outcome: Progressing   Problem: Clinical Measurements: Goal: Ability to maintain clinical measurements within normal limits will improve Outcome: Progressing Goal: Will remain free from infection Outcome: Progressing Goal: Diagnostic test results will improve Outcome: Progressing Goal: Respiratory complications will improve Outcome: Progressing Goal: Cardiovascular complication will be avoided Outcome: Progressing   Problem: Activity: Goal: Risk for activity intolerance will decrease Outcome: Progressing   Problem: Nutrition: Goal: Adequate nutrition will be maintained Outcome: Progressing   Problem: Coping: Goal: Level of anxiety will decrease Outcome: Progressing   Problem: Elimination: Goal: Will not experience complications related to bowel motility Outcome: Progressing Goal: Will not experience complications related to urinary retention Outcome: Progressing   Problem: Pain Managment: Goal: General experience of comfort will improve Outcome: Progressing   Problem: Safety: Goal: Ability to remain free from injury will improve Outcome: Progressing   Problem: Skin Integrity: Goal: Risk for impaired skin integrity will decrease Outcome:  Progressing   Problem: Education: Goal: Knowledge of the prescribed therapeutic regimen will improve Outcome: Progressing   Problem: Coping: Goal: Ability to identify and develop effective coping behavior will improve Outcome: Progressing   Problem: Clinical Measurements: Goal: Quality of life will improve Outcome: Progressing   Problem: Respiratory: Goal: Verbalizations of increased ease of respirations will increase Outcome: Progressing   Problem: Role Relationship: Goal: Family's ability to cope with current situation will improve Outcome: Progressing Goal: Ability to verbalize concerns, feelings, and thoughts to partner or family member will improve Outcome: Progressing   Problem: Pain Management: Goal: Satisfaction with pain management regimen will improve Outcome: Progressing   

## 2023-04-19 NOTE — TOC Progression Note (Signed)
Transition of Care [facility]) - Progression Note    Patient Details  Name: GEANCARLO BERMUDES MRN: [id number] Date of Birth: 02-20-1946  [facility]) CM/SW Contact  Mearl Latin, LCSW Phone Number: 04/19/2023, 9:24 AM  Clinical Narrative:    CSW received request for Hospice facility. Family preference is [facility]. CSW contacted Autumn at [facility] and provided referral. CSW was instructed to fax over clinicals to f. [phone] due to holiday.    Expected Discharge Plan: Hospice Medical Facility Barriers to Discharge: Hospice Bed not available  Expected Discharge Plan and Services In-house Referral: Clinical Social Work   Post Acute Care Choice: Skilled Nursing Facility                                         Social Determinants of Health (SDOH) Interventions SDOH Screenings   Food Insecurity: Patient Unable To Answer (04/18/2023)  Housing: Patient Unable To Answer (04/18/2023)  Transportation Needs: Patient Unable To Answer (04/18/2023)  Utilities: Patient Unable To Answer (04/18/2023)  Depression (PHQ2-9): Low Risk  (03/22/2023)  Financial Resource Strain: Low Risk  (03/22/2023)  Tobacco Use: Medium Risk (03/30/2023)    Readmission Risk Interventions    04/14/2023    8:59 AM  Readmission Risk Prevention Plan  Transportation Screening Complete  HRI or Home Care Consult Complete  Social Work Consult for Recovery Care Planning/Counseling Complete  Palliative Care Screening Not Applicable  Medication Review [employer]) Complete

## 2023-04-19 NOTE — Progress Notes (Signed)
   Palliative Medicine Inpatient Follow Up Note   The Palliative Care team received notice that patients wife, Marlene Bast has spoken to [place] who would like to start weaning off oxygen.  I called and spoke with Marlene Bast this evening. We discussed the plan for an oxygen wean with a low dose opoid gtt during this time to prevent respiratory burden.  Anticipate an O2 wean of 2LPM each hour over the course of four hours to room air.  Discussed it is hard to know a perfect timeframe though I would anticipate more limited time.   Offered support considering the difficulty of this decision.   SUMMARY OF RECOMMENDATIONS   DNAR/DNI  Full comfort care with dilaudid gtt initiation  Wean O2 to RA over four hours  Ongoing PMT support  Additional Time Spent: 25 ______________________________________________________________________________________ Lamarr Lulas [place] Palliative Medicine Team Team Cell Phone: [phone] Please utilize secure chat with additional questions, if there is no response within 30 minutes please call the above phone number  Palliative Medicine Team providers are available by phone from 7am to 7pm daily and can be reached through the team cell phone.  Should this patient require assistance outside of these hours, please call the patient's attending physician.

## 2023-04-19 NOTE — Progress Notes (Signed)
Joshua Reil, DO notified of decreased systolic blood pressures in the 60-80s while sleeping. No new orders at this time.

## 2023-04-19 NOTE — Progress Notes (Signed)
PROGRESS NOTE                                                                                                                                                                                                             Patient Demographics:    Joshua Gentry, is a 77 y.o. male, DOB - [birth year], ZOX:[id number]  Outpatient Primary MD for the patient is Assunta Found, MD    LOS - 6  Admit date - 03/31/2023    Chief Complaint  Patient presents with   Shortness of Breath       Brief Narrative (HPI from H&P and additional information) and current assessment and plan- 77 year old male with recent diagnosis of stage IV metastatic right-sided renal cell cancer, s/p laparoscopic right nephrectomy by Dr. Ronne Binning few weeks ago, CAD with history of CABG in 1997, COPD, recent exacerbated leukocytosis, essential hypertension, OSA on CPAP, who was admitted few weeks ago to [facility] where he underwent nephrectomy on the right side for renal cell cancer, his stay at that time was complicated by aspiration pneumonia, he was treated for aspiration pneumonia and transferred to nursing home, he was readmitted to [facility] few days ago with worsening respiratory failure and found to have worsening hypoxic respiratory failure due to aspiration pneumonia and now acute PE as well, during his stay at [facility] he had sinus pauses, he was seen by cardiology at [facility], transferred to [facility] for further care.  Patient was transferred to my care morning of 04/19/2023, at this point previous medical team, palliative care team and family have already decided to transition the patient to full comfort care and transfer him to residential hospice, full comfort measures have been initiated, we are awaiting availability of residential hospice.  I discussed the plan short while ago with patient's wife who confirms the plan that was  formulated earlier, continue full comfort measures and look for residential hospice bed.   Subjective:    Joshua Gentry today in bed confused comfortable, comfort measures being pursued.        Condition - Extremely Guarded  Family Communication  :  wife 04/19/23  Code Status :  DNR  Consults  :  Cards, Pall care  PUD Prophylaxis :     Procedures  :  Disposition Plan  :    Status is: Inpatient  DVT Prophylaxis  :      Lab Results  Component Value Date   PLT 207 04/19/2023    Diet :  Diet Order             Diet regular Room service appropriate? Yes; Fluid consistency: Thin  Diet effective now                    Inpatient Medications  Scheduled Meds:  feeding supplement  1 Container Oral QHS   feeding supplement  237 mL Oral BID BM   Continuous Infusions: PRN Meds:.acetaminophen **OR** acetaminophen, antiseptic oral rinse, dextromethorphan-guaiFENesin, glycopyrrolate **OR** glycopyrrolate **OR** glycopyrrolate, haloperidol lactate, HYDROmorphone (DILAUDID) injection, ipratropium-albuterol, LORazepam, ondansetron **OR** ondansetron (ZOFRAN) IV, polyvinyl alcohol    Objective:   Vitals:   04/19/23 0009 04/19/23 0400 04/19/23 0439 04/19/23 0806  BP: (!) 83/58 (!) 107/51  (!) 131/49  Pulse: 85 81  87  Resp: (!) 27 (!) 24  (!) 24  Temp: 98 F (36.7 C) 97.8 F (36.6 C)  (!) 97.5 F (36.4 C)  TempSrc: Axillary Axillary  Axillary  SpO2: 94% 93%  93%  Weight:   108.1 kg   Height:        Wt Readings from Last 3 Encounters:  04/19/23 108.1 kg  04/12/23 110.2 kg  04/01/23 116.5 kg     Intake/Output Summary (Last 24 hours) at 04/19/2023 0835 Last data filed at 04/19/2023 0532 Gross per 24 hour  Intake 200 ml  Output 650 ml  Net -450 ml     Physical Exam  Awake but confused, No new F.N deficits,   [place].AT,PERRAL Supple Neck, No JVD,   Symmetrical Chest wall movement, Good air movement bilaterally, CTAB RRR,No Gallops,Rubs or new  Murmurs,  +ve B.Sounds, Abd Soft, No tenderness,   No Cyanosis, Clubbing or edema       Data Review:    Recent Labs  Lab 04/03/2023 1643 04/14/23 0552 04/14/23 1702 04/15/23 0403 04/16/23 0324 04/17/23 0354 04/18/23 0149 04/19/23 0237  WBC 111.2*   < > 131.8* 131.4* 102.0* 111.6* 92.8* 86.3*  HGB 11.0*   < > 10.0* 10.6* 10.2* 10.2* 9.0* 8.9*  HCT 35.8*   < > 33.2* 34.2* 34.0* 33.9* 29.5* 30.3*  PLT 240   < > 206 220 207 219 190 207  MCV 87.3   < > 87.8 87.5 88.1 89.9 86.8 91.5  MCH 26.8   < > 26.5 27.1 26.4 27.1 26.5 26.9  MCHC 30.7   < > 30.1 31.0 30.0 30.1 30.5 29.4*  RDW 16.5*   < > 16.6* 17.0* 17.0* 17.1* 17.2* 17.1*  LYMPHSABS 6.7*  --  2.6  --   --  0.0* 2.3 0.0*  MONOABS 1.1*  --  2.2*  --   --  3.3* 1.8* 1.7*  EOSABS 0.0  --  0.0  --   --  0.0 0.2 0.9*  BASOSABS 0.0  --  0.0  --   --  0.0 0.0 0.0   < > = values in this interval not displayed.    Recent Labs  Lab 03/27/2023 1643 03/30/2023 1833 04/10/2023 2140 04/14/23 0552 04/16/23 0324 04/16/23 0749 04/18/23 0149 04/19/23 0237  NA 137  --   --  138 140 139 138 139  K 4.0  --   --  3.8 3.0* 3.8 3.3* 4.2  CL 103  --   --  106 108 108 108  113*  CO2 23  --   --  24 22 27 22  19*  ANIONGAP 11  --   --  8 10 4* 8 7  GLUCOSE 130*  --   --  145* 46* 117* 118* 79  BUN 27*  --   --  28* 28* 27* 26* 27*  CREATININE 1.64*  --   --  1.83* 1.59* 1.64* 1.64* 1.87*  AST 19  --   --  19  --   --   --  27  ALT 18  --   --  16  --   --   --  23  ALKPHOS 290*  --   --  280*  --   --   --  306*  BILITOT 1.1  --   --  1.1  --   --   --  0.6  ALBUMIN 2.5*  --   --  2.4*  --   --   --  1.8*  DDIMER  --   --  1.83*  --   --   --   --   --   PROCALCITON  --   --   --  1.34  --   --   --   --   LATICACIDVEN 2.1* 1.8  --   --   --   --   --   --   BNP 3,483.0*  --   --   --   --   --   --   --   MG  --   --   --  2.3  --  2.1 2.0  --   CALCIUM 11.5*  --   --  10.7* 9.6 9.5 9.0 9.5      Recent Labs  Lab 03/18/2023 1643  03/27/2023 1833 04/07/2023 2140 04/14/23 0552 04/16/23 0324 04/16/23 0749 04/18/23 0149 04/19/23 0237  DDIMER  --   --  1.83*  --   --   --   --   --   PROCALCITON  --   --   --  1.34  --   --   --   --   LATICACIDVEN 2.1* 1.8  --   --   --   --   --   --   BNP 3,483.0*  --   --   --   --   --   --   --   MG  --   --   --  2.3  --  2.1 2.0  --   CALCIUM 11.5*  --   --  10.7* 9.6 9.5 9.0 9.5    --------------------------------------------------------------------------------------------------------------- Lab Results  Component Value Date   CHOL 114 10/25/2018   HDL 35 (L) 10/25/2018   LDLCALC 57 10/25/2018   TRIG 140 10/25/2018   CHOLHDL 3.3 10/25/2018    Lab Results  Component Value Date   HGBA1C 5.6 02/04/2010     Micro Results Recent Results (from the past 240 hour(s))  Resp panel by RT-PCR (RSV, Flu A&B, Covid) Anterior Nasal Swab     Status: None   Collection Time: 04/09/2023  4:43 PM   Specimen: Anterior Nasal Swab  Result Value Ref Range Status   SARS Coronavirus 2 by RT PCR NEGATIVE NEGATIVE Final    Comment: (NOTE) SARS-CoV-2 target nucleic acids are NOT DETECTED.  The SARS-CoV-2 RNA is generally detectable in upper respiratory specimens during the acute phase of infection. The lowest concentration of SARS-CoV-2 viral  copies this assay can detect is 138 copies/mL. A negative result does not preclude SARS-Cov-2 infection and should not be used as the sole basis for treatment or other patient management decisions. A negative result may occur with  improper specimen collection/handling, submission of specimen other than nasopharyngeal swab, presence of viral mutation(s) within the areas targeted by this assay, and inadequate number of viral copies(<138 copies/mL). A negative result must be combined with clinical observations, patient history, and epidemiological information. The expected result is Negative.  Fact Sheet for Patients:   BloggerCourse.com  Fact Sheet for Healthcare Providers:  SeriousBroker.it  This test is no t yet approved or cleared by the [place] FDA and  has been authorized for detection and/or diagnosis of SARS-CoV-2 by FDA under an Emergency Use Authorization (EUA). This EUA will remain  in effect (meaning this test can be used) for the duration of the COVID-19 declaration under Section 564(b)(1) of the Act, 21 U.S.C.section 360bbb-3(b)(1), unless the authorization is terminated  or revoked sooner.       Influenza A by PCR NEGATIVE NEGATIVE Final   Influenza B by PCR NEGATIVE NEGATIVE Final    Comment: (NOTE) The Xpert Xpress SARS-CoV-2/FLU/RSV plus assay is intended as an aid in the diagnosis of influenza from Nasopharyngeal swab specimens and should not be used as a sole basis for treatment. Nasal washings and aspirates are unacceptable for Xpert Xpress SARS-CoV-2/FLU/RSV testing.  Fact Sheet for Patients: BloggerCourse.com  Fact Sheet for Healthcare Providers: SeriousBroker.it  This test is not yet approved or cleared by the [place] FDA and has been authorized for detection and/or diagnosis of SARS-CoV-2 by FDA under an Emergency Use Authorization (EUA). This EUA will remain in effect (meaning this test can be used) for the duration of the COVID-19 declaration under Section 564(b)(1) of the Act, 21 U.S.C. section 360bbb-3(b)(1), unless the authorization is terminated or revoked.     Resp Syncytial Virus by PCR NEGATIVE NEGATIVE Final    Comment: (NOTE) Fact Sheet for Patients: BloggerCourse.com  Fact Sheet for Healthcare Providers: SeriousBroker.it  This test is not yet approved or cleared by the [place] FDA and has been authorized for detection and/or diagnosis of SARS-CoV-2 by FDA under an Emergency Use  Authorization (EUA). This EUA will remain in effect (meaning this test can be used) for the duration of the COVID-19 declaration under Section 564(b)(1) of the Act, 21 U.S.C. section 360bbb-3(b)(1), unless the authorization is terminated or revoked.  Performed at [facility], [address] [place], [place] [address]   Culture, blood (routine x 2) Call MD if unable to obtain prior to antibiotics being given     Status: None   Collection Time: 03/18/2023  4:45 PM   Specimen: BLOOD LEFT HAND  Result Value Ref Range Status   Specimen Description BLOOD LEFT HAND  Final   Special Requests   Final    BOTTLES DRAWN AEROBIC AND ANAEROBIC Blood Culture adequate volume   Culture   Final    NO GROWTH 5 DAYS Performed at [facility], [address], [place], [place] [address]    Report Status 04/18/2023 FINAL  Final  Culture, blood (routine x 2) Call MD if unable to obtain prior to antibiotics being given     Status: None   Collection Time: 03/30/2023 10:17 PM   Specimen: BLOOD LEFT HAND  Result Value Ref Range Status   Specimen Description   Final    BLOOD LEFT HAND Performed at [facility],  [address], [place], [place] [address]    Special Requests   Final    BOTTLES DRAWN AEROBIC AND ANAEROBIC Blood Culture results may not be optimal due to an excessive volume of blood received in culture bottles   Culture   Final    NO GROWTH 5 DAYS Performed at [facility], [address], [place], [place] [address]    Report Status 04/18/2023 FINAL  Final  MRSA Next Gen by PCR, Nasal     Status: None   Collection Time: 04/15/23  9:52 AM   Specimen: Nasal Mucosa; Nasal Swab  Result Value Ref Range Status   MRSA by PCR Next Gen NOT DETECTED NOT DETECTED Final    Comment: (NOTE) The GeneXpert MRSA Assay (FDA approved for NASAL specimens only), is one component of a comprehensive MRSA colonization surveillance program. It is not intended to diagnose MRSA infection nor to guide or monitor  treatment for MRSA infections. Test performance is not FDA approved in patients less than 61 years old. Performed at [facility], [address] [place], [place] [address]     Radiology Reports DG CHEST PORT 1 VIEW  Result Date: 04/18/2023 CLINICAL DATA:  Pneumonia. EXAM: PORTABLE CHEST 1 VIEW COMPARISON:  Radiographs 03/24/2023 and 04/05/2023.  CT 03/16/2023. FINDINGS: 1402 hours. The heart size and mediastinal contours are stable status post median sternotomy and CABG. Extensive pulmonary nodularity is again noted bilaterally, suspicious for metastatic disease on recent CT. Confluent opacities at both lung bases are similar to the recent prior studies and may reflect superimposed pneumonia, atelectasis or progressive metastatic disease. No pneumothorax or significant pleural effusion. The bones appear unchanged. Telemetry leads overlie the chest. IMPRESSION: 1. No significant change in extensive bilateral pulmonary nodularity, suspicious for metastatic disease. 2. Confluent opacities at both lung bases may reflect superimposed pneumonia, atelectasis or progressive metastatic disease, similar to the recent radiographs. Electronically Signed   By: Carey Bullocks M.D.   On: 04/18/2023 15:17   US Venous Img Lower Bilateral (DVT)  Result Date: 04/17/2023 CLINICAL DATA:  The now acute pulmonary embolism. Evaluate for residual DVT. EXAM: BILATERAL LOWER EXTREMITY VENOUS DOPPLER ULTRASOUND TECHNIQUE: Gray-scale sonography with graded compression, as well as color Doppler and duplex ultrasound were performed to evaluate the lower extremity deep venous systems from the level of the common femoral vein and including the common femoral, femoral, profunda femoral, popliteal and calf veins including the posterior tibial, peroneal and gastrocnemius veins when visible. The superficial great saphenous vein was also interrogated. Spectral Doppler was utilized to evaluate flow at rest and with distal augmentation  maneuvers in the common femoral, femoral and popliteal veins. COMPARISON:  None Available. FINDINGS: RIGHT LOWER EXTREMITY Common Femoral Vein: No evidence of thrombus. Normal compressibility, respiratory phasicity and response to augmentation. Saphenofemoral Junction: No evidence of thrombus. Normal compressibility and flow on color Doppler imaging. Profunda Femoral Vein: No evidence of thrombus. Normal compressibility and flow on color Doppler imaging. Femoral Vein: No evidence of thrombus. Normal compressibility, respiratory phasicity and response to augmentation. Popliteal Vein: No evidence of thrombus. Normal compressibility, respiratory phasicity and response to augmentation. Calf Veins: No evidence of thrombus. Normal compressibility and flow on color Doppler imaging. Superficial Great Saphenous Vein: No evidence of thrombus. Normal compressibility. Venous Reflux:  None. Other Findings:  None. LEFT LOWER EXTREMITY Common Femoral Vein: No evidence of thrombus. Normal compressibility, respiratory phasicity and response to augmentation. Saphenofemoral Junction: No evidence of thrombus. Normal compressibility and flow on color Doppler imaging. Profunda Femoral  Vein: No evidence of thrombus. Normal compressibility and flow on color Doppler imaging. Femoral Vein: No evidence of thrombus. Normal compressibility, respiratory phasicity and response to augmentation. Popliteal Vein: No evidence of thrombus. Normal compressibility, respiratory phasicity and response to augmentation. Calf Veins: No evidence of thrombus. Normal compressibility and flow on color Doppler imaging. Superficial Great Saphenous Vein: No evidence of thrombus. Normal compressibility. Venous Reflux:  None. Other Findings:  None. IMPRESSION: No evidence of deep venous thrombosis in either lower extremity. Electronically Signed   By: Malachy Moan M.D.   On: 04/17/2023 10:48   NM Pulmonary Perfusion  Result Date: 04/16/2023 CLINICAL DATA:   Pulmonary embolism suspected, high probability. Right nephrectomy for renal cell carcinoma 04/05/2023 EXAM: NUCLEAR MEDICINE PERFUSION LUNG SCAN TECHNIQUE: Perfusion images were obtained in multiple projections after intravenous injection of radiopharmaceutical. Ventilation scans intentionally deferred if perfusion scan and chest x-ray adequate for interpretation during COVID 19 epidemic. RADIOPHARMACEUTICALS:  4.3 mCi Tc-56m MAA IV COMPARISON:  One-view chest x-ray 04/16/2023 FINDINGS: Wedge shaped segmental perfusion defects are present in the right upper lobe. Patchy filling defects on the left may be secondary to the metastases. IMPRESSION: Wedge-shaped perfusion defects in the right upper lobe consistent with pulmonary emboli. These results will be called to the ordering clinician or representative by the Radiologist Assistant, and communication documented in the PACS or [employer]. Electronically Signed   By: Marin Roberts M.D.   On: 04/16/2023 14:45      Signature  -   Susa Raring M.D on 04/19/2023 at 8:35 AM   -  To page go to [email or web address]

## 2023-04-19 NOTE — Consult Note (Signed)
Palliative Medicine Inpatient Consult Note  Consulting Provider: Dr. Mahala Menghini  Reason for consult:   Palliative Care Consult Services Palliative Medicine Consult  Reason for Consult? sGOC   04/19/2023  HPI:  Per intake H&P -->  77 year old male with recent diagnosis of stage IV metastatic right-sided renal cell cancer, s/p laparoscopic right nephrectomy by Dr. Ronne Binning few weeks ago, CAD with history of CABG in 1997, COPD, recent exacerbated leukocytosis, essential hypertension, OSA on CPAP. Recent nephrectomy with continued mental and functional decline. Palliative care involved to assist with goals of care conversations.   Clinical Assessment/Goals of Care:  *Please note that this is a verbal dictation therefore any spelling or grammatical errors are due to the "Dragon Medical One" system interpretation.  I have reviewed medical records including EPIC notes, labs and imaging, received report from bedside RN, assessed the patient who is lying in bed in NAD on 8LPM [place].    I met with patients wife, Marlene Bast, son, Ree Kida, daughter-in-law and granddaughter to further discuss diagnosis prognosis, GOC, EOL wishes, disposition and options.   I introduced Palliative Medicine as specialized medical care for people living with serious illness. It focuses on providing relief from the symptoms and stress of a serious illness. The goal is to improve quality of life for both the patient and the family.  Medical History Review and Understanding:  A review of child's past medical history was complete inclusive of metastatic renal cell carcinoma, coronary artery disease with quadruple bypass 27 years ago, COPD, hypertension, obstructive pulmonary disease, and squamous cell carcinoma.  Social History:  She also is from [facility].  He and his wife have been with 1 another for the past 55 years.  He has 3 sons [place], [place], and Jonny Ruiz.  He has 5 grandchildren.  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  He is known as an avid sports fan and gets great enjoyment out of seeing his children and grandchildren play a variety of sports.  He also loves watching sports on television and spending time with friends.  He is a man of strong faith.  Functional and Nutritional State:  Preceding hospitalization over the past 6 weeks though more notably in the last 2 weeks Cayde had become weaker and less able to care for himself.  He has utilized a cane and was borrowing a walker.  He was recently at [facility] the lasted there only 24 hours after his nephrectomy.  Palliative Symptoms:  Identified to be delirious and have incremental hallucinations.  Advance Directives:  A detailed discussion was had today regarding advanced directives.    Code Status: Concepts specific to code status, artifical feeding and hydration, continued IV antibiotics and rehospitalization was had.  The difference between a aggressive medical intervention path  and a palliative comfort care path for this patient at this time was had.   Amancio is a DO NOT RESUSCITATE DO NOT INTUBATE CODE STATUS.  Discussion:  Per patient's wife over the last year [place]en has progressively been slowing down and becoming more winded more easily.  He is identified as a man who loved to winding complain therefore many of his symptoms were not noticed as a result of him "crying wolf".  Around July 26 trials and his wife took their grandchildren out while he the next day noticed that he was having hematuria.  This alerted a cascade of events leading to his renal cell carcinoma diagnosis and identification of progressive disease to his lungs.  Patient's wife shares a degree of disbelief in the setting of his disease as she had initially thought he would get better after treatment of his pulmonary  embolism though she shares over the past few days meeting with Dr. Mahala Menghini as well as the cardiologist has illuminated the reality that he is entering the final stages of his life.  We discussed options at this point 1 of which would be to pursue more of a comfort mediated path.We talked about transition to comfort measures in house and what that would entail inclusive of medications to control pain, dyspnea, agitation, nausea, itching, and hiccups.  We discussed stopping all uneccessary measures such as cardiac monitoring, blood draws, needle sticks, and frequent vital signs. Utilized reflective listening throughout our time together.   I did share Niguel is on a great amount of oxygen right now and when we initiate a wean we often support patients with an opioid drip so they are comfortable as we are removing their high quantity of oxygen and allowing nature to take its course.  Patient's spouse would like family and friends to visit before that process is started.  In addition the ideal location for Caeleb would be at [facility] in [place].  Summarize goals at this time are to maintain dignity and comfort.  A referral will be made to inpatient hospice.  Her family Dr. Mahala Menghini had estimated a time period of about 3 days until Jabin passes away though I emphasized we often cannot reliably predict the time.  Though I did share once and oxygen wean as started I anticipate time will be limited.  Discussed the importance of continued conversation with family and their  medical providers regarding overall plan of care and treatment options, ensuring decisions are within the context of the patients values and GOCs.  Decision Maker: Candelaria,Mason (Spouse): [phone] (Home Phone)   SUMMARY OF RECOMMENDATIONS   DNAR/DNI  Comfort emphasis of care  Medications that will not contribute to patient comfort will be discontinued in an effort to minimize polypharmacy  Will initiate oxygen wean  at a time that suits family --> today they would like to focus on visiting with loved ones  Unrestricted visitation --> have alerted nursing staff and called front desk  Appreciate the transitions of care team making referral to [facility]  Ongoing palliative care support  Code Status/Advance Care Planning: DNAR/DNI  Palliative Prophylaxis:  Aspiration, Bowel Regimen, Delirium Protocol, Frequent Pain Assessment, Oral Care, Palliative Wound Care, and Turn Reposition  Additional Recommendations (Limitations, Scope, Preferences): Comfort care  Psycho-social/Spiritual:  Desire for further Chaplaincy support: Yes-have encouraged patient's spiritual family to visit Additional Recommendations: Education on end-of-life processes   Prognosis: Limited to days once oxygen wean is initiated  Discharge Planning: Discharge to inpatient hospice once a bed is identified  Vitals:   04/19/23 0400 04/19/23 0806  BP: (!) 107/51 (!) 131/49  Pulse: 81 87  Resp: (!) 24 (!) 24  Temp: 97.8 F (36.6 C) (!) 97.5 F (36.4 C)  SpO2: 93% 93%    Intake/Output Summary (Last 24 hours) at 04/19/2023 0851 Last data filed at 04/19/2023 0532 Gross per 24 hour  Intake 200 ml  Output 650 ml  Net -450 ml   Last Weight  Most recent update: 04/19/2023  4:39 AM    Weight  108.1 kg (238 lb 5.1 oz)  Gen: Elderly Caucasian male in no acute distress HEENT: moist mucous membranes CV: Regular rate and rhythm PULM: On 8 L nasal cannula breathing is even and unlabored ABD: soft/nondistended EXT: No edema Neuro: Sleeping  PPS: 10%   This conversation/these recommendations were discussed with patient primary care team, Dr. Thedore Mins   Total Time: 81 Billing based on MDM: High  Problems Addressed: One acute or chronic illness or injury that poses a threat to life or bodily function  Amount and/or Complexity of Data: Category 3:Discussion of management or test interpretation with external  physician/other qualified health care professional/appropriate source (not separately reported)  Risks: Decision regarding hospitalization or escalation of hospital care and Decision not to resuscitate or to de-escalate care because of poor prognosis ______________________________________________________ Lamarr Lulas [facility] Palliative Medicine Team Team Cell Phone: [phone] Please utilize secure chat with additional questions, if there is no response within 30 minutes please call the above phone number  Palliative Medicine Team providers are available by phone from 7am to 7pm daily and can be reached through the team cell phone.  Should this patient require assistance outside of these hours, please call the patient's attending physician.

## 2023-04-20 DIAGNOSIS — J9601 Acute respiratory failure with hypoxia: Secondary | ICD-10-CM | POA: Diagnosis not present

## 2023-04-20 LAB — PATHOLOGIST SMEAR REVIEW

## 2023-04-21 ENCOUNTER — Encounter: Payer: Medicare Other | Admitting: Urology

## 2023-04-22 ENCOUNTER — Inpatient Hospital Stay: Payer: Medicare Other | Admitting: Hematology

## 2023-04-22 ENCOUNTER — Inpatient Hospital Stay: Payer: Medicare Other

## 2023-04-26 LAB — CBC
HCT: 33.3 % — ABNORMAL LOW (ref 39.0–52.0)
Hemoglobin: 10.2 g/dL — ABNORMAL LOW (ref 13.0–17.0)
MCH: 26.8 pg (ref 26.0–34.0)
MCHC: 30.6 g/dL (ref 30.0–36.0)
MCV: 87.6 fL (ref 80.0–100.0)
Platelets: 210 10*3/uL (ref 150–400)
RBC: 3.8 MIL/uL — ABNORMAL LOW (ref 4.22–5.81)
RDW: 16.5 % — ABNORMAL HIGH (ref 11.5–15.5)
WBC: 128.2 10*3/uL (ref 4.0–10.5)
nRBC: 0 % (ref 0.0–0.2)

## 2023-05-18 NOTE — Progress Notes (Signed)
   Palliative Medicine Inpatient Follow Up Note  Per chart review, Mr. Carlo passed away early this morning.  I stopped by Leonette Most' room to offer condolences to his family though no one was present.  The PMT will be available if additional support is needed.  No Charge  ______________________________________________________________________________________ Lamarr Lulas [place] Palliative Medicine Team Team Cell Phone: [phone] Please utilize secure chat with additional questions, if there is no response within 30 minutes please call the above phone number  Palliative Medicine Team providers are available by phone from 7am to 7pm daily and can be reached through the team cell phone.  Should this patient require assistance outside of these hours, please call the patient's attending physician.

## 2023-05-18 NOTE — Plan of Care (Signed)
  Problem: Activity: Goal: Ability to tolerate increased activity will improve Outcome: Not Progressing   Problem: Education: Goal: Knowledge of General Education information will improve Description: Including pain rating scale, medication(s)/side effects and non-pharmacologic comfort measures Outcome: Not Progressing   Problem: Clinical Measurements: Goal: Ability to maintain clinical measurements within normal limits will improve Outcome: Not Progressing Goal: Diagnostic test results will improve Outcome: Not Progressing   Problem: Activity: Goal: Risk for activity intolerance will decrease Outcome: Not Progressing   Problem: Nutrition: Goal: Adequate nutrition will be maintained Outcome: Not Progressing

## 2023-05-18 NOTE — Progress Notes (Addendum)
Called into patient's room by wife who suspected patient had passed. Confirmed that patient expired at 0458 with Gilman Buttner, RN. 30 mLs of dilaudid wasted with Rodman Pickle, Charity fundraiser. Dr. Julian Reil also notified of patient's death.

## 2023-05-18 NOTE — Death Summary Note (Signed)
[facility] Death Note                                                                                                                                                                                               Joshua Gentry, is a 77 y.o. male, DOB - [birth year], [id number]  Admit date - 23-Apr-2023   Admitting Physician Frankey Shown, DO  Outpatient Primary MD for the patient is Assunta Found, MD  LOS - 7  Chief Complaint  Patient presents with   Shortness of Breath       Notification: Assunta Found, MD notified of death of 30-Apr-2023   Admit Date:  04-23-2023  Date of Death: Date of Death: 04/30/2023  Time of Death: Time of Death: 0458  Length of Stay: 7    Date and Time of Death -   Pronounced by -   History of present illness:   Joshua Gentry is a 77 y.o. male with a history of [age] year old male with recent diagnosis of stage IV metastatic right-sided renal cell cancer, s/p laparoscopic right nephrectomy by Dr. Ronne Binning few weeks ago, CAD with history of CABG in 05/04/1996, COPD, recent exacerbated leukocytosis, essential hypertension, OSA on CPAP, who was admitted few weeks ago to [facility] where he underwent nephrectomy on the right side for renal cell cancer, his stay at that time was complicated by aspiration pneumonia, he was treated for aspiration pneumonia and transferred to nursing home, he was readmitted to [facility] few days ago with worsening respiratory failure and found to have worsening hypoxic respiratory failure due to aspiration pneumonia and now acute PE as well, during his stay at [facility] he had sinus pauses, he was seen by cardiology at [facility], transferred to [facility] for further care.  Patient was transferred to my care morning of 04/19/2023, at this point previous medical team, palliative care team and family have already decided to  transition the patient to full comfort care and transfer him to residential hospice, full comfort measures have been initiated, we are awaiting availability of residential hospice.  I discussed the plan short while ago with patient's wife who confirms the plan that was formulated earlier, was on full comfort care and awaiting residential hospice bed, passed away in no discomfort early morning of 2023/04/30.   Final Diagnoses:  Cause if death - Renal cell cancer  Signature  -    Susa Raring M.D on 30-Apr-2023 at 8:06 AM   -  To page go to [email or web address]   Total clinical and documentation time for today Under 30 minutes  Last Note                                                                        PROGRESS NOTE                                                                                                                                                                                                             Patient Demographics:    Joshua Gentry, is a 77 y.o. male, DOB - Nov 08, 1945, [id number]  Outpatient Primary MD for the patient is Assunta Found, MD    LOS - 7  Admit date - 04/17/2023    Chief Complaint  Patient presents with   Shortness of Breath       Brief Narrative (HPI from H&P and additional information) and current assessment and plan- 77 year old male with recent diagnosis of stage IV metastatic right-sided renal cell cancer, s/p laparoscopic right nephrectomy by Dr. Ronne Binning few weeks ago, CAD with history of CABG in 1997, COPD, recent exacerbated leukocytosis, essential hypertension, OSA on CPAP, who was admitted few weeks ago to [facility] where he underwent nephrectomy on the right side for renal cell cancer, his stay at that time was complicated by aspiration pneumonia, he was treated for aspiration pneumonia and transferred to nursing home, he was readmitted to [facility] few days ago with worsening respiratory failure and found to have worsening  hypoxic respiratory failure due to aspiration pneumonia and now acute PE as well, during his stay at [facility] he had sinus pauses, he was seen by cardiology at [facility], transferred to [facility] for further care.  Patient was transferred to my care morning of 04/19/2023, at this point previous medical team, palliative care team and family have already decided to transition the patient to full comfort care and transfer him to residential hospice, full comfort measures have been initiated, we are awaiting availability of residential hospice.  I discussed the plan short while ago with patient's wife who confirms the plan that was formulated earlier, continue full comfort measures and look for residential hospice bed.   Subjective:    Joshua Gentry today in bed confused comfortable, comfort measures being pursued.        Condition - Extremely Guarded  Family Communication  :  wife 04/19/23  Code Status :  DNR  Consults  :  Cards, Pall care  PUD Prophylaxis :     Procedures  :           Disposition Plan  :    Status is: Inpatient  DVT Prophylaxis  :      Lab Results  Component Value Date   PLT 207 04/19/2023    Diet :  Diet Order             Diet regular Room service appropriate? Yes; Fluid consistency: Thin  Diet effective now                    Inpatient Medications  Scheduled Meds:  feeding supplement  1 Container Oral QHS   feeding supplement  237 mL Oral BID BM   Continuous Infusions:  HYDROmorphone 2 mg/hr (05/01/2023 0258)   PRN Meds:.acetaminophen **OR** acetaminophen, antiseptic oral rinse, dextromethorphan-guaiFENesin, glycopyrrolate **OR** glycopyrrolate **OR** glycopyrrolate, haloperidol lactate, HYDROmorphone, HYDROmorphone (DILAUDID) injection, ipratropium-albuterol, LORazepam, ondansetron **OR** ondansetron (ZOFRAN) IV, polyvinyl alcohol    Objective:   Vitals:   04/19/23 0806 04/19/23 1146 04/19/23 2156 05/08/2023 0514  BP: (!)  131/49 102/66 120/75   Pulse: 87 99    Resp: (!) 24 (!) 35    Temp: (!) 97.5 F (36.4 C) (!) 97.5 F (36.4 C)    TempSrc: Axillary Axillary    SpO2: 93% 96%    Weight:    108.1 kg  Height:    6\' 2"  (1.88 m)    Wt Readings from Last 3 Encounters:  04/19/2023 108.1 kg  04/12/23 110.2 kg  04/01/23 116.5 kg    No intake or output data in the 24 hours ending 04/19/2023 0806    Physical Exam  Awake but confused, No new F.N deficits,   [place].AT,PERRAL Supple Neck, No JVD,   Symmetrical Chest wall movement, Good air movement bilaterally, CTAB RRR,No Gallops,Rubs or new Murmurs,  +ve B.Sounds, Abd Soft, No tenderness,   No Cyanosis, Clubbing or edema       Data Review:    Recent Labs  Lab 03/29/2023 1643 04/14/23 0552 04/14/23 1702 04/15/23 0403 04/16/23 0324 04/17/23 0354 04/18/23 0149 04/19/23 0237  WBC 111.2*   < > 131.8* 131.4* 102.0* 111.6* 92.8* 86.3*  HGB 11.0*   < > 10.0* 10.6* 10.2* 10.2* 9.0* 8.9*  HCT 35.8*   < > 33.2* 34.2* 34.0* 33.9* 29.5* 30.3*  PLT 240   < > 206 220 207 219 190 207  MCV 87.3   < > 87.8 87.5 88.1 89.9 86.8 91.5  MCH 26.8   < > 26.5 27.1 26.4 27.1 26.5 26.9  MCHC 30.7   < > 30.1 31.0 30.0 30.1 30.5 29.4*  RDW 16.5*   < > 16.6* 17.0* 17.0* 17.1* 17.2* 17.1*  LYMPHSABS 6.7*  --  2.6  --   --  0.0* 2.3 0.0*  MONOABS 1.1*  --  2.2*  --   --  3.3* 1.8* 1.7*  EOSABS 0.0  --  0.0  --   --  0.0 0.2 0.9*  BASOSABS 0.0  --  0.0  --   --  0.0 0.0 0.0   < > = values in this interval not displayed.    Recent Labs  Lab 04/16/2023 1643 03/31/2023 1833 04/09/2023 2140 04/14/23 0552 04/16/23 0324 04/16/23 0749 04/18/23 0149 04/19/23 0237  NA 137  --   --  138 140 139  138 139  K 4.0  --   --  3.8 3.0* 3.8 3.3* 4.2  CL 103  --   --  106 108 108 108 113*  CO2 23  --   --  24 22 27 22  19*  ANIONGAP 11  --   --  8 10 4* 8 7  GLUCOSE 130*  --   --  145* 46* 117* 118* 79  BUN 27*  --   --  28* 28* 27* 26* 27*  CREATININE 1.64*  --   --  1.83* 1.59* 1.64*  1.64* 1.87*  AST 19  --   --  19  --   --   --  27  ALT 18  --   --  16  --   --   --  23  ALKPHOS 290*  --   --  280*  --   --   --  306*  BILITOT 1.1  --   --  1.1  --   --   --  0.6  ALBUMIN 2.5*  --   --  2.4*  --   --   --  1.8*  DDIMER  --   --  1.83*  --   --   --   --   --   PROCALCITON  --   --   --  1.34  --   --   --   --   LATICACIDVEN 2.1* 1.8  --   --   --   --   --   --   BNP 3,483.0*  --   --   --   --   --   --   --   MG  --   --   --  2.3  --  2.1 2.0  --   CALCIUM 11.5*  --   --  10.7* 9.6 9.5 9.0 9.5      Recent Labs  Lab 03/26/2023 1643 04/01/2023 1833 04/04/2023 2140 04/14/23 0552 04/16/23 0324 04/16/23 0749 04/18/23 0149 04/19/23 0237  DDIMER  --   --  1.83*  --   --   --   --   --   PROCALCITON  --   --   --  1.34  --   --   --   --   LATICACIDVEN 2.1* 1.8  --   --   --   --   --   --   BNP 3,483.0*  --   --   --   --   --   --   --   MG  --   --   --  2.3  --  2.1 2.0  --   CALCIUM 11.5*  --   --  10.7* 9.6 9.5 9.0 9.5    --------------------------------------------------------------------------------------------------------------- Lab Results  Component Value Date   CHOL 114 10/25/2018   HDL 35 (L) 10/25/2018   LDLCALC 57 10/25/2018   TRIG 140 10/25/2018   CHOLHDL 3.3 10/25/2018    Lab Results  Component Value Date   HGBA1C 5.6 02/04/2010     Micro Results Recent Results (from the past 240 hour(s))  Resp panel by RT-PCR (RSV, Flu A&B, Covid) Anterior Nasal Swab     Status: None   Collection Time: 04/12/2023  4:43 PM   Specimen: Anterior Nasal Swab  Result Value Ref Range Status   SARS Coronavirus 2 by RT PCR NEGATIVE NEGATIVE Final    Comment: (NOTE) SARS-CoV-2  target nucleic acids are NOT DETECTED.  The SARS-CoV-2 RNA is generally detectable in upper respiratory specimens during the acute phase of infection. The lowest concentration of SARS-CoV-2 viral copies this assay can detect is 138 copies/mL. A negative result does not preclude  SARS-Cov-2 infection and should not be used as the sole basis for treatment or other patient management decisions. A negative result may occur with  improper specimen collection/handling, submission of specimen other than nasopharyngeal swab, presence of viral mutation(s) within the areas targeted by this assay, and inadequate number of viral copies(<138 copies/mL). A negative result must be combined with clinical observations, patient history, and epidemiological information. The expected result is Negative.  Fact Sheet for Patients:  BloggerCourse.com  Fact Sheet for Healthcare Providers:  SeriousBroker.it  This test is no t yet approved or cleared by the [place] FDA and  has been authorized for detection and/or diagnosis of SARS-CoV-2 by FDA under an Emergency Use Authorization (EUA). This EUA will remain  in effect (meaning this test can be used) for the duration of the COVID-19 declaration under Section 564(b)(1) of the Act, 21 U.S.C.section 360bbb-3(b)(1), unless the authorization is terminated  or revoked sooner.       Influenza A by PCR NEGATIVE NEGATIVE Final   Influenza B by PCR NEGATIVE NEGATIVE Final    Comment: (NOTE) The Xpert Xpress SARS-CoV-2/FLU/RSV plus assay is intended as an aid in the diagnosis of influenza from Nasopharyngeal swab specimens and should not be used as a sole basis for treatment. Nasal washings and aspirates are unacceptable for Xpert Xpress SARS-CoV-2/FLU/RSV testing.  Fact Sheet for Patients: BloggerCourse.com  Fact Sheet for Healthcare Providers: SeriousBroker.it  This test is not yet approved or cleared by the [place] FDA and has been authorized for detection and/or diagnosis of SARS-CoV-2 by FDA under an Emergency Use Authorization (EUA). This EUA will remain in effect (meaning this test can be used) for the duration of  the COVID-19 declaration under Section 564(b)(1) of the Act, 21 U.S.C. section 360bbb-3(b)(1), unless the authorization is terminated or revoked.     Resp Syncytial Virus by PCR NEGATIVE NEGATIVE Final    Comment: (NOTE) Fact Sheet for Patients: BloggerCourse.com  Fact Sheet for Healthcare Providers: SeriousBroker.it  This test is not yet approved or cleared by the [place] FDA and has been authorized for detection and/or diagnosis of SARS-CoV-2 by FDA under an Emergency Use Authorization (EUA). This EUA will remain in effect (meaning this test can be used) for the duration of the COVID-19 declaration under Section 564(b)(1) of the Act, 21 U.S.C. section 360bbb-3(b)(1), unless the authorization is terminated or revoked.  Performed at [facility], [address] [place], [place] [address]   Culture, blood (routine x 2) Call MD if unable to obtain prior to antibiotics being given     Status: None   Collection Time: 03/19/2023  4:45 PM   Specimen: BLOOD LEFT HAND  Result Value Ref Range Status   Specimen Description BLOOD LEFT HAND  Final   Special Requests   Final    BOTTLES DRAWN AEROBIC AND ANAEROBIC Blood Culture adequate volume   Culture   Final    NO GROWTH 5 DAYS Performed at [facility], [address], [place], [place] [address]    Report Status 04/18/2023 FINAL  Final  Culture, blood (routine x 2) Call MD if unable to obtain prior to antibiotics being given     Status: None   Collection Time: 03/23/2023 10:17 PM  Specimen: BLOOD LEFT HAND  Result Value Ref Range Status   Specimen Description   Final    BLOOD LEFT HAND Performed at [facility], [address] [place], [place] [address]    Special Requests   Final    BOTTLES DRAWN AEROBIC AND ANAEROBIC Blood Culture results may not be optimal due to an excessive volume of blood received in culture bottles   Culture   Final    NO GROWTH 5 DAYS Performed at  [facility], [address], [place], [place] [address]    Report Status 04/18/2023 FINAL  Final  MRSA Next Gen by PCR, Nasal     Status: None   Collection Time: 04/15/23  9:52 AM   Specimen: Nasal Mucosa; Nasal Swab  Result Value Ref Range Status   MRSA by PCR Next Gen NOT DETECTED NOT DETECTED Final    Comment: (NOTE) The GeneXpert MRSA Assay (FDA approved for NASAL specimens only), is one component of a comprehensive MRSA colonization surveillance program. It is not intended to diagnose MRSA infection nor to guide or monitor treatment for MRSA infections. Test performance is not FDA approved in patients less than 78 years old. Performed at [facility], [address] [place], [place] [address]     Radiology Reports DG CHEST PORT 1 VIEW  Result Date: 04/18/2023 CLINICAL DATA:  Pneumonia. EXAM: PORTABLE CHEST 1 VIEW COMPARISON:  Radiographs 04/04/2023 and 04/05/2023.  CT 03/16/2023. FINDINGS: 1402 hours. The heart size and mediastinal contours are stable status post median sternotomy and CABG. Extensive pulmonary nodularity is again noted bilaterally, suspicious for metastatic disease on recent CT. Confluent opacities at both lung bases are similar to the recent prior studies and may reflect superimposed pneumonia, atelectasis or progressive metastatic disease. No pneumothorax or significant pleural effusion. The bones appear unchanged. Telemetry leads overlie the chest. IMPRESSION: 1. No significant change in extensive bilateral pulmonary nodularity, suspicious for metastatic disease. 2. Confluent opacities at both lung bases may reflect superimposed pneumonia, atelectasis or progressive metastatic disease, similar to the recent radiographs. Electronically Signed   By: Carey Bullocks M.D.   On: 04/18/2023 15:17   US Venous Img Lower Bilateral (DVT)  Result Date: 04/17/2023 CLINICAL DATA:  The now acute pulmonary embolism. Evaluate for residual DVT. EXAM: BILATERAL LOWER EXTREMITY VENOUS  DOPPLER ULTRASOUND TECHNIQUE: Gray-scale sonography with graded compression, as well as color Doppler and duplex ultrasound were performed to evaluate the lower extremity deep venous systems from the level of the common femoral vein and including the common femoral, femoral, profunda femoral, popliteal and calf veins including the posterior tibial, peroneal and gastrocnemius veins when visible. The superficial great saphenous vein was also interrogated. Spectral Doppler was utilized to evaluate flow at rest and with distal augmentation maneuvers in the common femoral, femoral and popliteal veins. COMPARISON:  None Available. FINDINGS: RIGHT LOWER EXTREMITY Common Femoral Vein: No evidence of thrombus. Normal compressibility, respiratory phasicity and response to augmentation. Saphenofemoral Junction: No evidence of thrombus. Normal compressibility and flow on color Doppler imaging. Profunda Femoral Vein: No evidence of thrombus. Normal compressibility and flow on color Doppler imaging. Femoral Vein: No evidence of thrombus. Normal compressibility, respiratory phasicity and response to augmentation. Popliteal Vein: No evidence of thrombus. Normal compressibility, respiratory phasicity and response to augmentation. Calf Veins: No evidence of thrombus. Normal compressibility and flow on color Doppler imaging. Superficial Great Saphenous Vein: No evidence of thrombus. Normal compressibility. Venous Reflux:  None. Other Findings:  None. LEFT LOWER EXTREMITY Common Femoral  Vein: No evidence of thrombus. Normal compressibility, respiratory phasicity and response to augmentation. Saphenofemoral Junction: No evidence of thrombus. Normal compressibility and flow on color Doppler imaging. Profunda Femoral Vein: No evidence of thrombus. Normal compressibility and flow on color Doppler imaging. Femoral Vein: No evidence of thrombus. Normal compressibility, respiratory phasicity and response to augmentation. Popliteal Vein: No  evidence of thrombus. Normal compressibility, respiratory phasicity and response to augmentation. Calf Veins: No evidence of thrombus. Normal compressibility and flow on color Doppler imaging. Superficial Great Saphenous Vein: No evidence of thrombus. Normal compressibility. Venous Reflux:  None. Other Findings:  None. IMPRESSION: No evidence of deep venous thrombosis in either lower extremity. Electronically Signed   By: Malachy Moan M.D.   On: 04/17/2023 10:48   NM Pulmonary Perfusion  Result Date: 04/16/2023 CLINICAL DATA:  Pulmonary embolism suspected, high probability. Right nephrectomy for renal cell carcinoma 04/05/2023 EXAM: NUCLEAR MEDICINE PERFUSION LUNG SCAN TECHNIQUE: Perfusion images were obtained in multiple projections after intravenous injection of radiopharmaceutical. Ventilation scans intentionally deferred if perfusion scan and chest x-ray adequate for interpretation during COVID 19 epidemic. RADIOPHARMACEUTICALS:  4.3 mCi Tc-5m MAA IV COMPARISON:  One-view chest x-ray 04/03/2023 FINDINGS: Wedge shaped segmental perfusion defects are present in the right upper lobe. Patchy filling defects on the left may be secondary to the metastases. IMPRESSION: Wedge-shaped perfusion defects in the right upper lobe consistent with pulmonary emboli. These results will be called to the ordering clinician or representative by the Radiologist Assistant, and communication documented in the PACS or [employer]. Electronically Signed   By: Marin Roberts M.D.   On: 04/16/2023 14:45      Signature  -   Susa Raring M.D on 04/30/2023 at 8:06 AM   -  To page go to [email or web address]

## 2023-05-18 DEATH — deceased

## 2023-05-19 ENCOUNTER — Inpatient Hospital Stay: Payer: Medicare Other | Admitting: Hematology
